# Patient Record
Sex: Female | Born: 1980
Health system: Southern US, Community
[De-identification: ages and names within clinical notes are randomized; demographics above are authoritative.]

## PROBLEM LIST (undated history)

## (undated) ENCOUNTER — Ambulatory Visit (HOSPITAL_COMMUNITY): Payer: 59

## (undated) DIAGNOSIS — I10 Essential (primary) hypertension: Secondary | ICD-10-CM

## (undated) DIAGNOSIS — K219 Gastro-esophageal reflux disease without esophagitis: Secondary | ICD-10-CM

## (undated) DIAGNOSIS — O24919 Unspecified diabetes mellitus in pregnancy, unspecified trimester: Secondary | ICD-10-CM

## (undated) DIAGNOSIS — J302 Other seasonal allergic rhinitis: Secondary | ICD-10-CM

## (undated) HISTORY — PX: TUBAL LIGATION: SHX77

---

## 2007-05-01 ENCOUNTER — Emergency Department (HOSPITAL_COMMUNITY): Admission: EM | Admit: 2007-05-01 | Discharge: 2007-05-01 | Payer: Self-pay | Admitting: Family Medicine

## 2010-01-02 ENCOUNTER — Emergency Department (HOSPITAL_COMMUNITY): Admission: EM | Admit: 2010-01-02 | Discharge: 2010-01-02 | Payer: Self-pay | Admitting: Emergency Medicine

## 2010-01-03 ENCOUNTER — Inpatient Hospital Stay (HOSPITAL_COMMUNITY): Admission: AD | Admit: 2010-01-03 | Discharge: 2010-01-03 | Payer: Self-pay | Admitting: Obstetrics and Gynecology

## 2010-01-03 ENCOUNTER — Ambulatory Visit: Payer: Self-pay | Admitting: Obstetrics and Gynecology

## 2010-06-28 ENCOUNTER — Emergency Department (HOSPITAL_COMMUNITY): Admission: EM | Admit: 2010-06-28 | Discharge: 2010-06-29 | Payer: Self-pay | Admitting: Emergency Medicine

## 2010-11-27 ENCOUNTER — Encounter: Payer: Self-pay | Admitting: Obstetrics & Gynecology

## 2010-11-27 ENCOUNTER — Ambulatory Visit
Admission: RE | Admit: 2010-11-27 | Discharge: 2010-11-27 | Payer: Self-pay | Source: Home / Self Care | Attending: Obstetrics & Gynecology | Admitting: Obstetrics & Gynecology

## 2010-11-27 LAB — CONVERTED CEMR LAB
HCT: 36.3 % (ref 36.0–46.0)
Hemoglobin: 11.9 g/dL — ABNORMAL LOW (ref 12.0–15.0)
MCHC: 32.8 g/dL (ref 30.0–36.0)
MCV: 85 fL (ref 78.0–100.0)
Platelets: 280 10*3/uL (ref 150–400)
RBC: 4.27 M/uL (ref 3.87–5.11)
RDW: 14.5 % (ref 11.5–15.5)
TSH: 0.617 microintl units/mL (ref 0.350–4.500)
WBC: 6.7 10*3/uL (ref 4.0–10.5)

## 2010-11-29 ENCOUNTER — Ambulatory Visit (HOSPITAL_COMMUNITY)
Admission: RE | Admit: 2010-11-29 | Discharge: 2010-11-29 | Payer: Self-pay | Source: Home / Self Care | Attending: Obstetrics & Gynecology | Admitting: Obstetrics & Gynecology

## 2010-12-02 LAB — POCT PREGNANCY, URINE: Preg Test, Ur: NEGATIVE

## 2010-12-04 ENCOUNTER — Ambulatory Visit
Admission: RE | Admit: 2010-12-04 | Discharge: 2010-12-04 | Payer: Self-pay | Source: Home / Self Care | Attending: Obstetrics & Gynecology | Admitting: Obstetrics & Gynecology

## 2010-12-04 LAB — POCT URINALYSIS DIPSTICK
Bilirubin Urine: NEGATIVE
Ketones, ur: NEGATIVE mg/dL
Nitrite: NEGATIVE
Protein, ur: NEGATIVE mg/dL
Specific Gravity, Urine: 1.025 (ref 1.005–1.030)
Urine Glucose, Fasting: NEGATIVE mg/dL
Urobilinogen, UA: 0.2 mg/dL (ref 0.0–1.0)
pH: 6 (ref 5.0–8.0)

## 2010-12-13 ENCOUNTER — Ambulatory Visit: Payer: Self-pay | Admitting: Obstetrics & Gynecology

## 2010-12-13 ENCOUNTER — Ambulatory Visit: Admit: 2010-12-13 | Payer: Self-pay | Admitting: Obstetrics and Gynecology

## 2010-12-13 DIAGNOSIS — N926 Irregular menstruation, unspecified: Secondary | ICD-10-CM

## 2010-12-13 DIAGNOSIS — R32 Unspecified urinary incontinence: Secondary | ICD-10-CM

## 2010-12-14 ENCOUNTER — Encounter (INDEPENDENT_AMBULATORY_CARE_PROVIDER_SITE_OTHER): Payer: Self-pay | Admitting: *Deleted

## 2011-01-24 LAB — COMPREHENSIVE METABOLIC PANEL
ALT: 23 U/L (ref 0–35)
AST: 18 U/L (ref 0–37)
Albumin: 4 g/dL (ref 3.5–5.2)
Alkaline Phosphatase: 97 U/L (ref 39–117)
BUN: 10 mg/dL (ref 6–23)
CO2: 27 mEq/L (ref 19–32)
Calcium: 8.9 mg/dL (ref 8.4–10.5)
Chloride: 106 mEq/L (ref 96–112)
Creatinine, Ser: 0.63 mg/dL (ref 0.4–1.2)
GFR calc Af Amer: >60 mL/min (ref >60)
GFR calc non Af Amer: >60 mL/min (ref >60)
Glucose, Bld: 116 mg/dL — ABNORMAL HIGH (ref 70–99)
Potassium: 3.1 mEq/L — ABNORMAL LOW (ref 3.5–5.1)
Sodium: 138 mEq/L (ref 135–145)
Total Bilirubin: 0.3 mg/dL (ref 0.3–1.2)
Total Protein: 7.5 g/dL (ref 6.0–8.3)

## 2011-01-24 LAB — CBC
HCT: 34.2 % — ABNORMAL LOW (ref 36.0–46.0)
Hemoglobin: 11.8 g/dL — ABNORMAL LOW (ref 12.0–15.0)
MCH: 29.1 pg (ref 26.0–34.0)
MCHC: 34.6 g/dL (ref 30.0–36.0)
MCV: 84.2 fL (ref 78.0–100.0)
Platelets: 260 10*3/uL (ref 150–400)
RBC: 4.06 MIL/uL (ref 3.87–5.11)
RDW: 13.4 % (ref 11.5–15.5)
WBC: 6.7 10*3/uL (ref 4.0–10.5)

## 2011-01-24 LAB — URINALYSIS, ROUTINE W REFLEX MICROSCOPIC
Bilirubin Urine: NEGATIVE
Glucose, UA: NEGATIVE mg/dL
Ketones, ur: NEGATIVE mg/dL
Leukocytes, UA: NEGATIVE
Nitrite: NEGATIVE
Protein, ur: NEGATIVE mg/dL
Specific Gravity, Urine: 1.033 — ABNORMAL HIGH (ref 1.005–1.030)
Urobilinogen, UA: 0.2 mg/dL (ref 0.0–1.0)
pH: 5.5 (ref 5.0–8.0)

## 2011-01-24 LAB — LIPASE, BLOOD: Lipase: 23 U/L (ref 11–59)

## 2011-01-24 LAB — URINE MICROSCOPIC-ADD ON

## 2011-01-24 LAB — DIFFERENTIAL
Basophils Absolute: 0.2 10*3/uL — ABNORMAL HIGH (ref 0.0–0.1)
Basophils Relative: 3 % — ABNORMAL HIGH (ref 0–1)
Eosinophils Absolute: 0.2 10*3/uL (ref 0.0–0.7)
Eosinophils Relative: 3 % (ref 0–5)
Lymphocytes Relative: 43 % (ref 12–46)
Lymphs Abs: 2.9 10*3/uL (ref 0.7–4.0)
Monocytes Absolute: 0.4 10*3/uL (ref 0.1–1.0)
Monocytes Relative: 6 % (ref 3–12)
Neutro Abs: 3 10*3/uL (ref 1.7–7.7)
Neutrophils Relative %: 46 % (ref 43–77)

## 2011-01-24 LAB — POCT PREGNANCY, URINE: Preg Test, Ur: NEGATIVE

## 2011-01-29 LAB — POCT URINALYSIS DIP (DEVICE)
Bilirubin Urine: NEGATIVE
Glucose, UA: NEGATIVE mg/dL
Ketones, ur: NEGATIVE mg/dL
Nitrite: NEGATIVE
Protein, ur: NEGATIVE mg/dL
Specific Gravity, Urine: 1.02 (ref 1.005–1.030)
Urobilinogen, UA: 0.2 mg/dL (ref 0.0–1.0)
pH: 6.5 (ref 5.0–8.0)

## 2011-01-29 LAB — WET PREP, GENITAL
Trich, Wet Prep: NONE SEEN
WBC, Wet Prep HPF POC: NONE SEEN
Yeast Wet Prep HPF POC: NONE SEEN

## 2011-01-29 LAB — POCT PREGNANCY, URINE: Preg Test, Ur: NEGATIVE

## 2011-01-29 LAB — GC/CHLAMYDIA PROBE AMP, GENITAL
Chlamydia, DNA Probe: NEGATIVE
GC Probe Amp, Genital: NEGATIVE

## 2011-01-30 NOTE — Progress Notes (Signed)
Deborah Jacobson, BRADNER NO.:  [id number]  MEDICAL RECORD NO.:  [id number]           PATIENT TYPE:  LOCATION:  [facility]                     FACILITY:  PHYSICIAN:  Allie Bossier, MD        DATE OF BIRTH:  [birth year]  DATE OF SERVICE:  11/27/2010                                 CLINIC NOTE  [name] is a 30 year old married black G6, P4, A2 who comes in here today as a self-referral.  She comes in with a complaint of skipping periods.  In fact from August until December 2011, she had no period. When she does have her periods, it lasts about 4 days and it is somewhat heavy.  She had a tubal in the past.  PAST MEDICAL HISTORY:  Reflux and obesity.  MEDICATIONS:  Multivitamins.  PAST SURGICAL HISTORY:  Tubal.  PHYSICAL EXAMINATION:  GENERAL:  Morbidly obese black female in no apparent distress.  There is no particular hirsutism noted. VITAL SIGNS:  Please note, she is 4 feet 9 inches, weighs 198 pounds. PELVIC:  Her cervix appears normal.  Her uterus is about 12-week size, consistent with fibroids.  Her adnexa are nonenlarged.  ASSESSMENT AND PLAN:  Oligomenorrhea.  I have ordered a TSH and CBC.  I am ordering a gynecologic ultrasound.  Please note, she also does complain of some pelvic pain for the last year.  She does not take any medicines for this said pelvic pain, and I was not able to elicit any specific pain on exam.  She will follow up after these results are available.     Allie Bossier, MD    MCD/MEDQ  D:  11/27/2010  T:  11/28/2010  Job:  [id number]

## 2011-01-31 LAB — WET PREP, GENITAL
Clue Cells Wet Prep HPF POC: NONE SEEN
Trich, Wet Prep: NONE SEEN
Yeast Wet Prep HPF POC: NONE SEEN

## 2011-01-31 LAB — CBC
HCT: 33.8 % — ABNORMAL LOW (ref 36.0–46.0)
Hemoglobin: 11.2 g/dL — ABNORMAL LOW (ref 12.0–15.0)
MCHC: 33.2 g/dL (ref 30.0–36.0)
MCV: 89 fL (ref 78.0–100.0)
Platelets: 250 10*3/uL (ref 150–400)
RBC: 3.8 MIL/uL — ABNORMAL LOW (ref 3.87–5.11)
RDW: 13.2 % (ref 11.5–15.5)
WBC: 7.2 10*3/uL (ref 4.0–10.5)

## 2011-01-31 LAB — TSH: TSH: 0.874 u[IU]/mL (ref 0.350–4.500)

## 2011-01-31 NOTE — Progress Notes (Signed)
[name], [name] NO.:  [id number]  MEDICAL RECORD NO.:  [id number]           PATIENT TYPE:  A  LOCATION:  [facility]                   FACILITY:  [facility]  PHYSICIAN:  Scheryl Darter, MD       DATE OF BIRTH:  1981-04-29  DATE OF SERVICE:  12/13/2010                                 CLINIC NOTE  The patient returns today for results of her lab work and ultrasound. The patient was seen by Dr. Marice Potter on November 27, 2010.  She is 30 year old black female, gravida 6, para 4, abortus 2.  Last menstrual period in December who has had irregular periods.  Her menses are somewhat heavy and may last 4-7 days.  Status post tubal ligation.  On physical exam on November 27, 2010, Dr. Marice Potter identified uterine enlargement consistent with fibroids.  The patient has some occasional sharp lower abdominal pain.  She says when ultrasound was being performed, there was some pain in her right lower quadrant near her C- section scar.  She says that she has noticed last few months that she will leak urine with coughing or straining or with laughing.  No particular symptoms of urge.  She occasionally has nocturia.  On physical exam, her C-section scar is well-healed and there is no bulge or hernia identified.  Pelvic pain was deferred today.  Ultrasound on November 29, 2010, states that this is a normal exam, although suboptimal visualization of the uterine fundus due to the position of the patient's pain, endometrium was not well visualized.  Portions of lower uterine segment were unremarkable.  The ovaries were normal.  No free fluid.  IMPRESSION:  Pelvic pain and signs of urinary incontinence.  Pain maybe of the bladder origin.  There was some blood in her urine when she was seen 2 weeks ago.  I will request a urine culture.  We will make a urology referral.  We discussed her regular menses and she would like to have regular cycles and would be interested in being on oral contraceptives  for cycle control.  Gave her prescription for Alesse. She will return in 3 months to review her progress here.     Scheryl Darter, MD    JA/MEDQ  D:  12/13/2010  T:  12/14/2010  Job:  [id number]

## 2011-08-22 ENCOUNTER — Inpatient Hospital Stay (INDEPENDENT_AMBULATORY_CARE_PROVIDER_SITE_OTHER)
Admission: RE | Admit: 2011-08-22 | Discharge: 2011-08-22 | Disposition: A | Discharge status: Home / Self Care | Payer: BC Managed Care – PPO | Source: Ambulatory Visit | Attending: Family Medicine | Admitting: Family Medicine

## 2011-08-22 DIAGNOSIS — N760 Acute vaginitis: Secondary | ICD-10-CM

## 2011-08-22 DIAGNOSIS — A499 Bacterial infection, unspecified: Secondary | ICD-10-CM

## 2011-08-22 LAB — WET PREP, GENITAL
Trich, Wet Prep: NONE SEEN
Yeast Wet Prep HPF POC: NONE SEEN

## 2011-08-23 LAB — GC/CHLAMYDIA PROBE AMP, GENITAL
Chlamydia, DNA Probe: NEGATIVE
GC Probe Amp, Genital: NEGATIVE

## 2011-08-27 LAB — WET PREP, GENITAL
Trich, Wet Prep: NONE SEEN
Yeast Wet Prep HPF POC: NONE SEEN

## 2011-08-27 LAB — POCT URINALYSIS DIP (DEVICE)
Glucose, UA: NEGATIVE
Hgb urine dipstick: NEGATIVE
Ketones, ur: 40 — AB
Nitrite: NEGATIVE
Operator id: 239701
Protein, ur: 30 — AB
Specific Gravity, Urine: 1.025
Urobilinogen, UA: 1
pH: 5.5

## 2011-08-27 LAB — POCT PREGNANCY, URINE
Operator id: 239701
Preg Test, Ur: NEGATIVE

## 2011-08-27 LAB — GC/CHLAMYDIA PROBE AMP, GENITAL
Chlamydia, DNA Probe: NEGATIVE
GC Probe Amp, Genital: NEGATIVE

## 2012-12-27 ENCOUNTER — Emergency Department (HOSPITAL_COMMUNITY)
Admission: EM | Admit: 2012-12-27 | Discharge: 2012-12-27 | Disposition: A | Discharge status: Home / Self Care | Payer: BC Managed Care – PPO | Attending: Emergency Medicine | Admitting: Emergency Medicine

## 2012-12-27 ENCOUNTER — Encounter (HOSPITAL_COMMUNITY): Payer: Self-pay | Admitting: *Deleted

## 2012-12-27 DIAGNOSIS — J3489 Other specified disorders of nose and nasal sinuses: Secondary | ICD-10-CM | POA: Insufficient documentation

## 2012-12-27 DIAGNOSIS — J00 Acute nasopharyngitis [common cold]: Secondary | ICD-10-CM | POA: Insufficient documentation

## 2012-12-27 DIAGNOSIS — J029 Acute pharyngitis, unspecified: Secondary | ICD-10-CM | POA: Insufficient documentation

## 2012-12-27 DIAGNOSIS — H109 Unspecified conjunctivitis: Secondary | ICD-10-CM | POA: Insufficient documentation

## 2012-12-27 DIAGNOSIS — J069 Acute upper respiratory infection, unspecified: Secondary | ICD-10-CM | POA: Insufficient documentation

## 2012-12-27 DIAGNOSIS — H938X9 Other specified disorders of ear, unspecified ear: Secondary | ICD-10-CM | POA: Insufficient documentation

## 2012-12-27 MED ORDER — KETOROLAC TROMETHAMINE 0.5 % OP SOLN: 1.0000 [drp] | Freq: Four times a day (QID) | OPHTHALMIC | Status: DC

## 2012-12-27 MED ORDER — ERYTHROMYCIN 5 MG/GM OP OINT: TOPICAL_OINTMENT | Freq: Every day | OPHTHALMIC | Status: DC

## 2012-12-27 MED ORDER — FLUORESCEIN SODIUM 1 MG OP STRP
1.0000 | ORAL_STRIP | Freq: Once | OPHTHALMIC | Status: AC
  Administered 2012-12-27: 1 via OPHTHALMIC
  Filled 2012-12-27: qty 1

## 2012-12-27 MED ORDER — TETRACAINE HCL 0.5 % OP SOLN
2.0000 [drp] | Freq: Once | OPHTHALMIC | Status: AC
  Administered 2012-12-27: 2 [drp] via OPHTHALMIC
  Filled 2012-12-27: qty 2

## 2012-12-27 NOTE — ED Provider Notes (Signed)
History     CSN: [id number]  Arrival date & time 12/27/12  0509   First MD Initiated Contact with Patient 12/27/12 [phone]      Chief Complaint  Patient presents with  . URI  . Eye Pain    (Consider location/radiation/quality/duration/timing/severity/associated sxs/prior treatment) HPI 32 year old female presents emergency department with chief complaint of left eye your dictation and drainage.  Patient has had symptoms of upper respiratory infection for the past 3 days including cough, coryza, congestion, sore and itchy throat and ears.  Patient states that last night she began having your dictation and watering of her left eye.  This morning she woke with crusted on her lashes and purulent discharge from the eye.  She complains of some feeling of irritation "like sand is in my eye."  The patient does not wear her contact lenses. She has no history of eye problems previously.  Denies fevers, chills, myalgias, arthralgias. Denies DOE, SOB, chest tightness or pressure, radiation to left arm, jaw or back, or diaphoresis. Denies dysuria, flank pain, suprapubic pain, frequency, urgency, or hematuria. Denies headaches, light headedness, weakness, visual disturbances. Denies abdominal pain, nausea, vomiting, diarrhea or constipation.   History reviewed. No pertinent past medical history.  History reviewed. No pertinent past surgical history.  No family history on file.  History  Substance Use Topics  . Smoking status: Never Smoker   . Smokeless tobacco: Not on file  . Alcohol Use: No    OB History   Grav Para Term Preterm Abortions TAB SAB Ect Mult Living                  Review of Systems Ten systems reviewed and are negative for acute change, except as noted in the HPI.   Allergies  Review of patient's allergies indicates no known allergies.  Home Medications   Current Outpatient Rx  Name  Route  Sig  Dispense  Refill  . acetaminophen (TYLENOL) 500 MG tablet   Oral  Take 500 mg by mouth every 6 (six) hours as needed for pain.         Marland Kitchen Phenyleph-Doxylamine-DM-APAP (ALKA-SELTZER PLS ALLERGY & [facility] PO)   Oral   Take 1 tablet by mouth every 4 (four) hours as needed (congestion).           BP 130/81  Pulse 91  Temp(Src) 98.1 F (36.7 C) (Oral)  Resp 18  SpO2 98%  LMP 04/26/2012  Physical Exam  Constitutional: She is oriented to person, place, and time. She appears well-developed and well-nourished. No distress.  HENT:  Head: Normocephalic and atraumatic.  Eyes: EOM are normal. Pupils are equal, round, and reactive to light. No foreign bodies found. Right eye exhibits no chemosis, no discharge, no exudate and no hordeolum. No foreign body present in the right eye. Left eye exhibits discharge. Left eye exhibits no chemosis, no exudate and no hordeolum. No foreign body present in the left eye. Right conjunctiva is not injected. Right conjunctiva has no hemorrhage. Left conjunctiva is injected. Left conjunctiva has no hemorrhage. No scleral icterus.  Neck: Normal range of motion.  Cardiovascular: Normal rate, regular rhythm and normal heart sounds.  Exam reveals no gallop and no friction rub.   No murmur heard. Pulmonary/Chest: Effort normal and breath sounds normal. No respiratory distress.  Abdominal: Soft. Bowel sounds are normal. She exhibits no distension and no mass. There is no tenderness. There is no guarding.  Neurological: She is alert and oriented to person, place, and  time.  Skin: Skin is warm and dry. She is not diaphoretic.   fluorescein exam reveals no abrasions or ulcerations. ED Course  Procedures (including critical care time)   Labs Reviewed - No data to display No results found.   1. Conjunctivitis       MDM  7:06 AM  Pt dx likely viral conjunctivitis based on presentation & eye exam.will d/c with abx. No evidence of HSV or VSV infection. Pt is not a contact lens wearer.  Exam non-concerning for orbital cellulitis,  hyphema, corneal ulcers, corneal abrasions or trauma.  Patient will be discharged home with ketorolac drops and emycin salve..  Patient has been instructed to use cool compresses and practice personal hygiene with frequent hand washing.  Patient understands to follow up with ophthalmology, especially if new symptoms including change in vision, purulent drainage, or entrapment occur.          Arthor Captain, PA-C 12/27/12 [phone]

## 2012-12-27 NOTE — ED Notes (Signed)
Patient states cold symptoms x few days and now left eye with pain and drainage

## 2012-12-28 NOTE — ED Provider Notes (Signed)
Medical screening examination/treatment/procedure(s) were performed by non-physician practitioner and as supervising physician I was immediately available for consultation/collaboration.  Fujiko Picazo, MD 12/28/12 0141 

## 2013-03-07 ENCOUNTER — Encounter (HOSPITAL_COMMUNITY): Payer: Self-pay | Admitting: *Deleted

## 2013-03-07 ENCOUNTER — Emergency Department (HOSPITAL_COMMUNITY)
Admission: EM | Admit: 2013-03-07 | Discharge: 2013-03-07 | Disposition: A | Discharge status: Home / Self Care | Payer: BC Managed Care – PPO | Attending: Emergency Medicine | Admitting: Emergency Medicine

## 2013-03-07 DIAGNOSIS — Z91040 Latex allergy status: Secondary | ICD-10-CM | POA: Insufficient documentation

## 2013-03-07 DIAGNOSIS — R631 Polydipsia: Secondary | ICD-10-CM | POA: Insufficient documentation

## 2013-03-07 DIAGNOSIS — R42 Dizziness and giddiness: Secondary | ICD-10-CM | POA: Insufficient documentation

## 2013-03-07 DIAGNOSIS — R358 Other polyuria: Secondary | ICD-10-CM | POA: Insufficient documentation

## 2013-03-07 DIAGNOSIS — R079 Chest pain, unspecified: Secondary | ICD-10-CM | POA: Insufficient documentation

## 2013-03-07 DIAGNOSIS — Z3202 Encounter for pregnancy test, result negative: Secondary | ICD-10-CM | POA: Insufficient documentation

## 2013-03-07 DIAGNOSIS — Z8719 Personal history of other diseases of the digestive system: Secondary | ICD-10-CM | POA: Insufficient documentation

## 2013-03-07 DIAGNOSIS — R110 Nausea: Secondary | ICD-10-CM | POA: Insufficient documentation

## 2013-03-07 DIAGNOSIS — R3589 Other polyuria: Secondary | ICD-10-CM | POA: Insufficient documentation

## 2013-03-07 DIAGNOSIS — R350 Frequency of micturition: Secondary | ICD-10-CM | POA: Insufficient documentation

## 2013-03-07 DIAGNOSIS — H538 Other visual disturbances: Secondary | ICD-10-CM | POA: Insufficient documentation

## 2013-03-07 HISTORY — DX: Gastro-esophageal reflux disease without esophagitis: K21.9

## 2013-03-07 HISTORY — DX: Unspecified diabetes mellitus in pregnancy, unspecified trimester: O24.919

## 2013-03-07 LAB — URINALYSIS, ROUTINE W REFLEX MICROSCOPIC
Bilirubin Urine: NEGATIVE
Glucose, UA: NEGATIVE mg/dL
Ketones, ur: NEGATIVE mg/dL
Leukocytes, UA: NEGATIVE
Nitrite: NEGATIVE
Protein, ur: NEGATIVE mg/dL
Specific Gravity, Urine: 1.01 (ref 1.005–1.030)
Urobilinogen, UA: 0.2 mg/dL (ref 0.0–1.0)
pH: 6.5 (ref 5.0–8.0)

## 2013-03-07 LAB — CBC
HCT: 36.6 % (ref 36.0–46.0)
Hemoglobin: 12.4 g/dL (ref 12.0–15.0)
MCH: 27.6 pg (ref 26.0–34.0)
MCHC: 33.9 g/dL (ref 30.0–36.0)
MCV: 81.5 fL (ref 78.0–100.0)
Platelets: 247 10*3/uL (ref 150–400)
RBC: 4.49 MIL/uL (ref 3.87–5.11)
RDW: 14.2 % (ref 11.5–15.5)
WBC: 6.1 10*3/uL (ref 4.0–10.5)

## 2013-03-07 LAB — BASIC METABOLIC PANEL
BUN: 9 mg/dL (ref 6–23)
CO2: 27 mEq/L (ref 19–32)
Calcium: 9.8 mg/dL (ref 8.4–10.5)
Chloride: 103 mEq/L (ref 96–112)
Creatinine, Ser: 0.67 mg/dL (ref 0.50–1.10)
GFR calc Af Amer: >90 mL/min (ref >90)
GFR calc non Af Amer: >90 mL/min (ref >90)
Glucose, Bld: 94 mg/dL (ref 70–99)
Potassium: 3.8 mEq/L (ref 3.5–5.1)
Sodium: 136 mEq/L (ref 135–145)

## 2013-03-07 LAB — URINE MICROSCOPIC-ADD ON

## 2013-03-07 LAB — POCT PREGNANCY, URINE: Preg Test, Ur: NEGATIVE

## 2013-03-07 LAB — GLUCOSE, CAPILLARY: Glucose-Capillary: 101 mg/dL — ABNORMAL HIGH (ref 70–99)

## 2013-03-07 NOTE — ED Notes (Signed)
Pt is concerned that she has symptoms of diabetes:  Lightheadedness, frequent urination, jittery, and nauseated.  Pt reports numbness in fingertips for a while, swelling in ankles/hands.

## 2013-03-07 NOTE — ED Notes (Signed)
Pt states that she had chest pain on 4/24, and is experiencing the pain again today. Pt states that she becomes short of breath with movement. Pt also states that she has a slight headache that started yesterday, and she is having numbness in her right fingers.

## 2013-03-07 NOTE — ED Provider Notes (Signed)
History     CSN: [id number]  Arrival date & time 03/07/13  7829   First MD Initiated Contact with Patient 03/07/13 1140      Chief Complaint  Patient presents with  . Dizziness  . Urinary Frequency    (Consider location/radiation/quality/duration/timing/severity/associated sxs/prior treatment) HPI  Deborah Jacobson is an obese  32 y.o. female female with no significant medical history presents with feeling lightheaded, nauseous, tingling in her fingers, polydipsia, polyuria and blurred vision  x 2 days. Pt states that last Thursday she experienced sharp right sided chest pain that lasted less than a minute before it would resolve completely. She denies any associated symptoms such as SOB, nausea, or diaphoresis. She experienced several cycles of this pain during the day. Two days ago she began feeling dizzy when walking, nauseous, and "jittery". She also describes tingling in her finger tips bilaterally. She also describes one episode of similar chest pain to Thursday this morning. She also describes increased thirst and polyuria. She denies dysuria or urgency. This morning she also noticed her vision becoming slightly blurry. She also describes some increased swelling in her feet which is a chronic problem for this patient. She denies vision loss, fever, cold symptoms, SOB, abdominal pain, vomiting, diarrhea, hematocheiza, or weakness.   Past Medical History  Diagnosis Date  . Diabetes in pregnancy   . Acid reflux     Past Surgical History  Procedure Laterality Date  . Cesarean section      No family history on file.  History  Substance Use Topics  . Smoking status: Never Smoker   . Smokeless tobacco: Not on file  . Alcohol Use: No    OB History   Grav Para Term Preterm Abortions TAB SAB Ect Mult Living                  Review of Systems  Constitutional: Negative for fever.  Respiratory: Negative for shortness of breath.   Cardiovascular: Positive for chest pain.   Gastrointestinal: Positive for nausea. Negative for vomiting, abdominal pain and diarrhea.  Endocrine: Positive for polydipsia and polyuria. Negative for polyphagia.  Neurological: Positive for light-headedness.  All other systems reviewed and are negative.    Allergies  Latex  Home Medications   Current Outpatient Rx  Name  Route  Sig  Dispense  Refill  . Melatonin 3 MG TABS   Oral   Take 3 mg by mouth at bedtime.           BP 124/90  Pulse 70  Temp(Src) 98.5 F (36.9 C) (Oral)  Resp 18  SpO2 100%  LMP 02/04/2013  Physical Exam  Nursing note and vitals reviewed. Constitutional: She is oriented to person, place, and time. She appears well-developed and well-nourished. No distress.  Obese  HENT:  Head: Normocephalic.  Mouth/Throat: Oropharynx is clear and moist.  Eyes: Conjunctivae and EOM are normal.  Neck: Normal range of motion.  Acanthosis nigricans  Cardiovascular: Normal rate, regular rhythm, normal heart sounds and intact distal pulses.   No murmur heard. Pulmonary/Chest: Effort normal and breath sounds normal. No stridor. No respiratory distress. She has no wheezes. She has no rales. She exhibits no tenderness.  Abdominal: Soft. Bowel sounds are normal. She exhibits no distension and no mass. There is no tenderness. There is no rebound and no guarding.  Musculoskeletal: Normal range of motion.  Neurological: She is alert and oriented to person, place, and time.  Psychiatric: She has a normal mood and affect.  ED Course  Procedures (including critical care time)  Labs Reviewed  URINALYSIS, ROUTINE W REFLEX MICROSCOPIC - Abnormal; Notable for the following:    Hgb urine dipstick SMALL (*)    All other components within normal limits  GLUCOSE, CAPILLARY - Abnormal; Notable for the following:    Glucose-Capillary 101 (*)    All other components within normal limits  URINE MICROSCOPIC-ADD ON - Abnormal; Notable for the following:    Squamous  Epithelial / LPF FEW (*)    All other components within normal limits  CBC  BASIC METABOLIC PANEL  POCT PREGNANCY, URINE   No results found.   Date: 03/07/2013  Rate: 62  Rhythm: normal sinus rhythm  QRS Axis: normal  Intervals: normal  ST/T Wave abnormalities: nonspecific T wave changes  Conduction Disutrbances:none  Narrative Interpretation:   Old EKG Reviewed: unchanged    03/07/13 1238  Visual Acuity Bilateral Near 20/20 Bilateral Distance R Near R Distance 20/30 L Near L Distance 20/30   1. Urinary frequency       MDM   Deborah Jacobson is a 32 y.o. female with concerns that she is diabetic, patient has family history and has polyuria and polydipsia. CBG today's normal at 101. I've explained to the patient she will need A1c testing with her primary care physician. Patient has verbalized her understanding and said that she will followup. Blood work and urinalysis showed no gross abnormalities. EKG is triage initiated for fleeting right-sided chest pain. Also nonischemic. Patient reports a blurred vision her visual acuity shows 20/30 bilaterally.   Filed Vitals:   03/07/13 1235 03/07/13 1236 03/07/13 1237 03/07/13 1245  BP: 105/66 100/60 112/64 112/73  Pulse: 56 65 59 86  Temp:      TempSrc:      Resp: 18  17 16   SpO2:    100%     VSS and patient is appropriate for and amenable to discharge at this time. Pt verbalized understanding and agrees with care plan. Outpatient follow-up and return precautions given.         Wynetta Emery, PA-C 03/07/13 1311

## 2013-03-09 NOTE — ED Provider Notes (Signed)
Medical screening examination/treatment/procedure(s) were performed by non-physician practitioner and as supervising physician I was immediately available for consultation/collaboration.   Rayelle Armor E Aireanna Luellen, MD 03/09/13 0725 

## 2013-06-26 ENCOUNTER — Encounter (HOSPITAL_COMMUNITY): Payer: Self-pay | Admitting: Emergency Medicine

## 2013-06-26 ENCOUNTER — Emergency Department (HOSPITAL_COMMUNITY)
Admission: EM | Admit: 2013-06-26 | Discharge: 2013-06-27 | Disposition: A | Discharge status: Home / Self Care | Payer: BC Managed Care – PPO | Attending: Emergency Medicine | Admitting: Emergency Medicine

## 2013-06-26 DIAGNOSIS — X58XXXA Exposure to other specified factors, initial encounter: Secondary | ICD-10-CM | POA: Insufficient documentation

## 2013-06-26 DIAGNOSIS — IMO0002 Reserved for concepts with insufficient information to code with codable children: Secondary | ICD-10-CM | POA: Insufficient documentation

## 2013-06-26 DIAGNOSIS — T148XXA Other injury of unspecified body region, initial encounter: Secondary | ICD-10-CM

## 2013-06-26 DIAGNOSIS — Z8632 Personal history of gestational diabetes: Secondary | ICD-10-CM | POA: Insufficient documentation

## 2013-06-26 DIAGNOSIS — Z91040 Latex allergy status: Secondary | ICD-10-CM | POA: Insufficient documentation

## 2013-06-26 DIAGNOSIS — Y9289 Other specified places as the place of occurrence of the external cause: Secondary | ICD-10-CM | POA: Insufficient documentation

## 2013-06-26 DIAGNOSIS — Y939 Activity, unspecified: Secondary | ICD-10-CM | POA: Insufficient documentation

## 2013-06-26 DIAGNOSIS — Z8719 Personal history of other diseases of the digestive system: Secondary | ICD-10-CM | POA: Insufficient documentation

## 2013-06-26 DIAGNOSIS — M79601 Pain in right arm: Secondary | ICD-10-CM
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
## 2013-06-27 MED ORDER — NAPROXEN 375 MG PO TABS: 375.0000 mg | ORAL_TABLET | Freq: Two times a day (BID) | ORAL | Status: DC

## 2013-06-27 MED ORDER — NAPROXEN 250 MG PO TABS
375.0000 mg | ORAL_TABLET | Freq: Once | ORAL | Status: AC
  Administered 2013-06-27: 375 mg via ORAL
  Filled 2013-06-27: qty 2

## 2013-06-27 MED ORDER — CYCLOBENZAPRINE HCL 10 MG PO TABS: 10.0000 mg | ORAL_TABLET | Freq: Two times a day (BID) | ORAL | Status: DC | PRN

## 2013-06-27 MED ORDER — DIAZEPAM 5 MG PO TABS
5.0000 mg | ORAL_TABLET | Freq: Once | ORAL | Status: AC
  Administered 2013-06-27: 5 mg via ORAL
  Filled 2013-06-27: qty 1

## 2013-06-30 NOTE — ED Provider Notes (Signed)
Medical screening examination/treatment/procedure(s) were performed by non-physician practitioner and as supervising physician I was immediately available for consultation/collaboration.   Leonardo Makris E Lylla Eifler, MD 06/30/13 1521 

## 2013-08-17 ENCOUNTER — Ambulatory Visit: Payer: Self-pay | Admitting: Podiatry

## 2013-08-25 ENCOUNTER — Encounter (HOSPITAL_COMMUNITY): Payer: Self-pay | Admitting: Emergency Medicine

## 2013-08-25 ENCOUNTER — Emergency Department (HOSPITAL_COMMUNITY)
Admission: EM | Admit: 2013-08-25 | Discharge: 2013-08-25 | Disposition: A | Discharge status: Home / Self Care | Payer: BC Managed Care – PPO | Attending: Emergency Medicine | Admitting: Emergency Medicine

## 2013-08-25 ENCOUNTER — Emergency Department (HOSPITAL_COMMUNITY): Payer: BC Managed Care – PPO

## 2013-08-25 DIAGNOSIS — R51 Headache: Secondary | ICD-10-CM | POA: Insufficient documentation

## 2013-08-25 DIAGNOSIS — R079 Chest pain, unspecified: Secondary | ICD-10-CM | POA: Insufficient documentation

## 2013-08-25 DIAGNOSIS — Z91040 Latex allergy status: Secondary | ICD-10-CM | POA: Insufficient documentation

## 2013-08-25 DIAGNOSIS — Z8719 Personal history of other diseases of the digestive system: Secondary | ICD-10-CM | POA: Insufficient documentation

## 2013-08-25 DIAGNOSIS — J209 Acute bronchitis, unspecified: Secondary | ICD-10-CM | POA: Insufficient documentation

## 2013-08-25 DIAGNOSIS — Z8632 Personal history of gestational diabetes: Secondary | ICD-10-CM | POA: Insufficient documentation

## 2013-08-25 DIAGNOSIS — J40 Bronchitis, not specified as acute or chronic: Secondary | ICD-10-CM

## 2013-08-25 LAB — RAPID STREP SCREEN (MED CTR MEBANE ONLY): Streptococcus, Group A Screen (Direct): NEGATIVE

## 2013-08-25 MED ORDER — DEXTROMETHORPHAN POLISTIREX 30 MG/5ML PO LQCR: 60.0000 mg | ORAL | Status: DC | PRN

## 2013-08-25 MED ORDER — AZITHROMYCIN 250 MG PO TABS: 250.0000 mg | ORAL_TABLET | Freq: Every day | ORAL | Status: DC
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
## 2013-08-25 NOTE — ED Provider Notes (Signed)
Medical screening examination/treatment/procedure(s) were performed by non-physician practitioner and as supervising physician I was immediately available for consultation/collaboration.    Vida Roller, MD 08/25/13 [phone]
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
## 2013-08-25 NOTE — ED Provider Notes (Signed)
CSN: [id number]     Arrival date & time 08/25/13  1853 History  This chart was scribed for non-physician practitioner Mckinley Jewel, working with Vida Roller, MD by Clydene Laming, ED Scribe. This patient was seen in room TR06C/TR06C and the patient's care was started at 7:59 PM.   Chief Complaint  Patient presents with  . Sore Throat    The history is provided by the patient. No language interpreter was used.   HPI Comments: Deborah Jacobson is a 32 y.o. female who presents to the Emergency Department complaining of a sore throat with an associated cough, chest pain, sinus pressure, and h/a onset two days ago. 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 Pt denies fever. She reports taking benadryl, Nyquil , and drinking an abundant amount of tea with honey.    Past Medical History  Diagnosis Date  . Diabetes in pregnancy   . Acid reflux    Past Surgical History  Procedure Laterality Date  . Cesarean section     History reviewed. No pertinent family history. History  Substance Use Topics  . Smoking status: Never Smoker   . Smokeless tobacco: Not on file  . Alcohol Use: No   OB History   Grav Para Term Preterm Abortions TAB SAB Ect Mult Living                 Review of Systems  HENT: Positive for sinus pressure and sore throat.   Respiratory: Positive for cough.     Allergies  Latex  Home Medications   Current Outpatient Rx  Name  Route  Sig  Dispense  Refill  . Pseudoeph-Doxylamine-DM-APAP (NYQUIL PO)   Oral   Take 1 tablet by mouth every 6 (six) hours as needed.          Triage Vitals:BP 140/85  Pulse 92  Temp(Src) 99.6 F (37.6 C) (Oral)  Resp 16  Wt 208 lb 7 oz (94.547 kg)  SpO2 97% Physical Exam  Nursing note and vitals reviewed. Constitutional: She is oriented to person, place, and time. She appears well-developed and well-nourished. No distress.  HENT:  Head: Normocephalic and atraumatic.  Eyes:  EOM are normal.  Neck: Neck supple. No tracheal deviation present.  Cardiovascular: Normal rate.   Pulmonary/Chest: Effort normal. No respiratory distress.  Musculoskeletal: Normal range of motion.  Neurological: She is alert and oriented to person, place, and time.  Skin: Skin is warm and dry.  Psychiatric: She has a normal mood and affect. Her behavior is normal.    ED Course  Procedures (including critical care time) DIAGNOSTIC STUDIES: Oxygen Saturation is on 97% on RA, normal by my interpretation.    COORDINATION OF CARE: 8:04 PM- Discussed treatment plan with pt at bedside. Pt verbalized understanding and agreement with plan.   Labs Review Labs Reviewed  RAPID STREP SCREEN   Imaging Review Dg Chest 2 View  08/25/2013   CLINICAL DATA:  Cough and vomiting.  EXAM: CHEST  2 VIEW  COMPARISON:  None.  FINDINGS: The cardiomediastinal silhouette is unremarkable.  Mild peribronchial thickening is identified.  There is no evidence of focal airspace disease, pulmonary edema, suspicious pulmonary nodule/mass, pleural effusion, or pneumothorax. No acute bony abnormalities are identified.  IMPRESSION: Mild peribronchial thickening without focal pneumonia.   Electronically Signed   By: Laveda Abbe M.D.   On: 08/25/2013 21:02    EKG Interpretation   None  MDM   1. Bronchitis    9:11 PM Patient will be treated for bronchitis with azithromycin. Vitals stable and patient afebrile. Patient instructed to return with worsening or concerning symptoms.   I personally performed the services described in this documentation, which was scribed in my presence. The recorded information has been reviewed and is accurate.     Emilia Beck, PA-C 08/25/13 2111  Emilia Beck, PA-C 08/25/13 2218

## 2013-08-27 LAB — CULTURE, GROUP A STREP

## 2014-03-01 ENCOUNTER — Encounter: Payer: Self-pay | Admitting: Podiatry

## 2014-03-01 ENCOUNTER — Ambulatory Visit (INDEPENDENT_AMBULATORY_CARE_PROVIDER_SITE_OTHER): Payer: BC Managed Care – PPO | Admitting: Podiatry

## 2014-03-01 VITALS — BP 131/80 | HR 78 | Ht <= 58 in | Wt 212.0 lb

## 2014-03-01 DIAGNOSIS — Q828 Other specified congenital malformations of skin: Secondary | ICD-10-CM

## 2014-03-01 DIAGNOSIS — M79606 Pain in leg, unspecified: Secondary | ICD-10-CM | POA: Insufficient documentation

## 2014-03-01 DIAGNOSIS — M79609 Pain in unspecified limb: Secondary | ICD-10-CM

## 2014-03-01 DIAGNOSIS — M722 Plantar fascial fibromatosis: Secondary | ICD-10-CM | POA: Insufficient documentation

## 2014-03-01 NOTE — Progress Notes (Signed)
Subjective: 33 year old female presents complaining of pain on left heel and ankle.  She has a history of fractured ankle in 2009 on left. Patient points posterior ankle joint near the borders ofnachilles tendon area being affected area.  Patient on feet all day at work.   Objective:  Neurovascular status are within normal. No edema or erythema noted on affected limb. Normal range of ankle joint motion. Positive of pain upon pressure to left heel plantar to dorsal. Positive of excess first ray sagittal plane motion left. Positive of porokeratotic lesion under 5th MPJ right foot.   Assessment: Plantar fasciitis left heel. Porokeratotic lesion right plantar. Pain in lower limb bilateral.  Plan: Clinical findings and available treatment options reviewed. Left heel injected as per request. Left heel jected with mixture of 4 mg Dexamethasone, 5 mg Triamcinolone, and 1 cc of 0.5% Marcaine plain. Patient tolerated well without difficulty.  Will prepare orthotics on next visit.

## 2014-03-01 NOTE — Patient Instructions (Signed)
Seen for left heel pain and right foot callus. Injection to left heel given. Right foot callus debrided. Need Custom orthotics. Will prepare on next visit.

## 2014-10-14 ENCOUNTER — Emergency Department (HOSPITAL_COMMUNITY)
Admission: EM | Admit: 2014-10-14 | Discharge: 2014-10-14 | Disposition: A | Discharge status: Home / Self Care | Payer: BC Managed Care – PPO | Attending: Emergency Medicine | Admitting: Emergency Medicine

## 2014-10-14 ENCOUNTER — Encounter (HOSPITAL_COMMUNITY): Payer: Self-pay | Admitting: *Deleted

## 2014-10-14 DIAGNOSIS — Z91040 Latex allergy status: Secondary | ICD-10-CM | POA: Insufficient documentation

## 2014-10-14 DIAGNOSIS — Z8719 Personal history of other diseases of the digestive system: Secondary | ICD-10-CM | POA: Insufficient documentation

## 2014-10-14 DIAGNOSIS — M545 Low back pain, unspecified: Secondary | ICD-10-CM

## 2014-10-14 DIAGNOSIS — Z8632 Personal history of gestational diabetes: Secondary | ICD-10-CM | POA: Diagnosis not present

## 2014-10-14 DIAGNOSIS — Z87891 Personal history of nicotine dependence: Secondary | ICD-10-CM | POA: Diagnosis not present

## 2014-10-14 DIAGNOSIS — M549 Dorsalgia, unspecified: Secondary | ICD-10-CM | POA: Diagnosis present

## 2014-10-14 MED ORDER — IBUPROFEN 400 MG PO TABS
800.0000 mg | ORAL_TABLET | Freq: Once | ORAL | Status: AC
  Administered 2014-10-14: 800 mg via ORAL

## 2014-10-14 MED ORDER — IBUPROFEN 800 MG PO TABS: 800.0000 mg | ORAL_TABLET | Freq: Three times a day (TID) | ORAL | Status: DC

## 2014-10-14 MED ORDER — CYCLOBENZAPRINE HCL 10 MG PO TABS
10.0000 mg | ORAL_TABLET | Freq: Once | ORAL | Status: AC
  Administered 2014-10-14: 10 mg via ORAL

## 2014-10-14 NOTE — ED Notes (Signed)
Declined W/C at D/C and was escorted to lobby by RN. 

## 2014-10-14 NOTE — Discharge Instructions (Signed)
Back Pain, Adult Low back pain is very common. About 1 in 5 people have back pain.The cause of low back pain is rarely dangerous. The pain often gets better over time.About half of people with a sudden onset of back pain feel better in just 2 weeks. About 8 in 10 people feel better by 6 weeks.  CAUSES Some common causes of back pain include:  Strain of the muscles or ligaments supporting the spine.  Wear and tear (degeneration) of the spinal discs.  Arthritis.  Direct injury to the back. DIAGNOSIS Most of the time, the direct cause of low back pain is not known.However, back pain can be treated effectively even when the exact cause of the pain is unknown.Answering your caregiver's questions about your overall health and symptoms is one of the most accurate ways to make sure the cause of your pain is not dangerous. If your caregiver needs more information, he or she may order lab work or imaging tests (X-rays or MRIs).However, even if imaging tests show changes in your back, this usually does not require surgery. HOME CARE INSTRUCTIONS For many people, back pain returns.Since low back pain is rarely dangerous, it is often a condition that people can learn to manageon their own.   Remain active. It is stressful on the back to sit or stand in one place. Do not sit, drive, or stand in one place for more than 30 minutes at a time. Take short walks on level surfaces as soon as pain allows.Try to increase the length of time you walk each day.  Do not stay in bed.Resting more than 1 or 2 days can delay your recovery.  Do not avoid exercise or work.Your body is made to move.It is not dangerous to be active, even though your back may hurt.Your back will likely heal faster if you return to being active before your pain is gone.  Pay attention to your body when you bend and lift. Many people have less discomfortwhen lifting if they bend their knees, keep the load close to their bodies,and  avoid twisting. Often, the most comfortable positions are those that put less stress on your recovering back.  Find a comfortable position to sleep. Use a firm mattress and lie on your side with your knees slightly bent. If you lie on your back, put a pillow under your knees.  Only take over-the-counter or prescription medicines as directed by your caregiver. Over-the-counter medicines to reduce pain and inflammation are often the most helpful.Your caregiver may prescribe muscle relaxant drugs.These medicines help dull your pain so you can more quickly return to your normal activities and healthy exercise.  Put ice on the injured area.  Put ice in a plastic bag.  Place a towel between your skin and the bag.  Leave the ice on for 15-20 minutes, 03-04 times a day for the first 2 to 3 days. After that, ice and heat may be alternated to reduce pain and spasms.  Ask your caregiver about trying back exercises and gentle massage. This may be of some benefit.  Avoid feeling anxious or stressed.Stress increases muscle tension and can worsen back pain.It is important to recognize when you are anxious or stressed and learn ways to manage it.Exercise is a great option. SEEK MEDICAL CARE IF:  You have pain that is not relieved with rest or medicine.  You have pain that does not improve in 1 week.  You have new symptoms.  You are generally not feeling well. SEEK   IMMEDIATE MEDICAL CARE IF:   You have pain that radiates from your back into your legs.  You develop new bowel or bladder control problems.  You have unusual weakness or numbness in your arms or legs.  You develop nausea or vomiting.  You develop abdominal pain.  You feel faint. Document Released: 10/27/2005 Document Revised: 04/27/2012 Document Reviewed: 02/28/2014 Exit[employer] Patient Information 2015 [employer]. This information is not intended to replace advice given to you by your health care provider. Make sure you  discuss any questions you have with your health care provider.  

## 2014-10-14 NOTE — ED Provider Notes (Signed)
CSN: [id number]     Arrival date & time 10/14/14  0740 History   First MD Initiated Contact with Patient 10/14/14 0802     Chief Complaint  Patient presents with  . Back Pain     (Consider location/radiation/quality/duration/timing/severity/associated sxs/prior Treatment) Patient is a 33 y.o. female presenting with back pain. The history is provided by the patient. No language interpreter was used.  Back Pain Location:  Lumbar spine Quality:  Aching Associated symptoms: no fever   Associated symptoms comment:  Bilateral lower back and flank pain for the past 2 days without known injury, similar to back pain she has had previously and prescribed muscle relaxers. She did not take any medications for this pain prior to arrival. No urinary symptoms. No abdominal pain, fever, N, V.   Past Medical History  Diagnosis Date  . Diabetes in pregnancy   . Acid reflux    Past Surgical History  Procedure Laterality Date  . Cesarean section     No family history on file. History  Substance Use Topics  . Smoking status: Former Games developermoker  . Smokeless tobacco: Never Used  . Alcohol Use: No   OB History    No data available     Review of Systems  Constitutional: Negative for fever and chills.  HENT: Negative.   Respiratory: Negative.   Cardiovascular: Negative.   Gastrointestinal: Negative.   Musculoskeletal: Positive for back pain.       See HPI.  Skin: Negative.   Neurological: Negative.       Allergies  Latex  Home Medications   Prior to Admission medications   Not on File   BP 133/82 mmHg  Pulse 83  Temp(Src) 98.1 F (36.7 C) (Oral)  Resp 16  SpO2 100%  LMP 09/19/2014 Physical Exam  Constitutional: She is oriented to person, place, and time. She appears well-developed and well-nourished.  HENT:  Head: Normocephalic.  Neck: Normal range of motion. Neck supple.  Cardiovascular: Normal rate and regular rhythm.   Pulmonary/Chest: Effort normal and breath sounds  normal.  Abdominal: Soft. Bowel sounds are normal. There is no tenderness. There is no rebound and no guarding.  Musculoskeletal: Normal range of motion.  Mild bilateral lumbar and paralumbar tenderness without swelling or discoloration.  Neurological: She is alert and oriented to person, place, and time. Coordination normal.  Skin: Skin is warm and dry. No rash noted.  Psychiatric: She has a normal mood and affect.    ED Course  Procedures (including critical care time) Labs Review Labs Reviewed - No data to display  Imaging Review No results found.   EKG Interpretation None      MDM   Final diagnoses:  None    1. Low back pain  Reproducible back pain, worse with movement or position, w/o dysuria or fever. Suspect muscular pain. Will add ibuprofen to her Rx of muscle relaxer.    Arnoldo HookerShari A Aurther Harlin, PA-C 10/14/14 0818  Flint MelterElliott L Wentz, MD 10/14/14 [phone]

## 2014-10-14 NOTE — ED Notes (Signed)
Pt reports back pain started on thanksgiving day. Pt is now worse.

## 2015-02-28 ENCOUNTER — Encounter (HOSPITAL_COMMUNITY): Payer: Self-pay | Admitting: *Deleted

## 2015-02-28 ENCOUNTER — Emergency Department (INDEPENDENT_AMBULATORY_CARE_PROVIDER_SITE_OTHER)
Admission: EM | Admit: 2015-02-28 | Discharge: 2015-02-28 | Disposition: A | Discharge status: Home / Self Care | Payer: BLUE CROSS/BLUE SHIELD | Source: Home / Self Care | Attending: Family Medicine | Admitting: Family Medicine

## 2015-02-28 DIAGNOSIS — B349 Viral infection, unspecified: Secondary | ICD-10-CM | POA: Diagnosis not present

## 2015-02-28 MED ORDER — IBUPROFEN 800 MG PO TABS: 800.0000 mg | ORAL_TABLET | Freq: Three times a day (TID) | ORAL | Status: DC

## 2015-02-28 NOTE — ED Notes (Signed)
Pt  Has  Seasonal  allerygs    She  Reports   Symptoms  Of      Sensation of  The  Glands    Seem    Swollen    And      r  Earache           With         Body  acheas  As   Well   Pt  Has  Seasonal  allergys    And  Has  Been taking  Benadryl      For    The  Symptoms

## 2015-02-28 NOTE — ED Provider Notes (Signed)
CSN: [id number]     Arrival date & time 02/28/15  1734 History   First MD Initiated Contact with Patient 02/28/15 1856     Chief Complaint  Patient presents with  . Allergies   (Consider location/radiation/quality/duration/timing/severity/associated sxs/prior Treatment) Patient is a 34 y.o. female presenting with pharyngitis. The history is provided by the patient. No language interpreter was used.  Sore Throat This is a new problem. The current episode started 2 days ago. The problem occurs constantly. The problem has been gradually worsening. Pertinent negatives include no headaches and no shortness of breath. Nothing aggravates the symptoms. Nothing relieves the symptoms. She has tried nothing for the symptoms. The treatment provided no relief.   Pt complains of a sore throat, neck pain, neck soreness, bodyaches Past Medical History  Diagnosis Date  . Diabetes in pregnancy   . Acid reflux    Past Surgical History  Procedure Laterality Date  . Cesarean section     History reviewed. No pertinent family history. History  Substance Use Topics  . Smoking status: Former Games developermoker  . Smokeless tobacco: Never Used  . Alcohol Use: No   OB History    No data available     Review of Systems  HENT: Positive for ear pain.   Respiratory: Negative for shortness of breath.   Neurological: Negative for headaches.  All other systems reviewed and are negative.   Allergies  Latex  Home Medications   Prior to Admission medications   Medication Sig Start Date End Date Taking? Authorizing Provider  ibuprofen (ADVIL,MOTRIN) 800 MG tablet Take 1 tablet (800 mg total) by mouth 3 (three) times daily. 02/28/15   Elson AreasLeslie K Meaghen Vecchiarelli, PA-C   BP 106/72 mmHg  Pulse 86  Temp(Src) 98.3 F (36.8 C) (Oral)  Resp 24  SpO2 99% Physical Exam  Constitutional: She appears well-developed and well-nourished.  HENT:  Head: Normocephalic and atraumatic.  Nose: Nose normal.  Mouth/Throat: Oropharynx is clear  and moist.  Eyes: Conjunctivae and EOM are normal. Pupils are equal, round, and reactive to light.  Neck: Normal range of motion.  Cardiovascular: Normal rate.   Pulmonary/Chest: Effort normal.  Musculoskeletal: Normal range of motion.  Neurological: She is alert.  Skin: Skin is warm.  Psychiatric: She has a normal mood and affect.  Nursing note and vitals reviewed.   ED Course  Procedures (including critical care time) Labs Review Labs Reviewed - No data to display  Imaging Review No results found.   MDM   1. Viral illness    Ibuprofen Drink plenty of fluids.  Return if any problems. OOW x 2 days   Elson AreasLeslie K Haddie Bruhl, [place] JerseyPA-C 02/28/15 1942

## 2015-02-28 NOTE — Discharge Instructions (Signed)

## 2015-03-04 ENCOUNTER — Encounter (HOSPITAL_COMMUNITY): Payer: Self-pay | Admitting: Emergency Medicine

## 2015-03-04 ENCOUNTER — Emergency Department (HOSPITAL_COMMUNITY)
Admission: EM | Admit: 2015-03-04 | Discharge: 2015-03-04 | Disposition: A | Discharge status: Home / Self Care | Payer: BLUE CROSS/BLUE SHIELD | Attending: Emergency Medicine | Admitting: Emergency Medicine

## 2015-03-04 ENCOUNTER — Emergency Department (HOSPITAL_COMMUNITY): Payer: BLUE CROSS/BLUE SHIELD

## 2015-03-04 DIAGNOSIS — Z8632 Personal history of gestational diabetes: Secondary | ICD-10-CM | POA: Insufficient documentation

## 2015-03-04 DIAGNOSIS — Z91040 Latex allergy status: Secondary | ICD-10-CM | POA: Insufficient documentation

## 2015-03-04 DIAGNOSIS — J209 Acute bronchitis, unspecified: Secondary | ICD-10-CM | POA: Insufficient documentation

## 2015-03-04 DIAGNOSIS — R05 Cough: Secondary | ICD-10-CM | POA: Diagnosis present

## 2015-03-04 DIAGNOSIS — Z87891 Personal history of nicotine dependence: Secondary | ICD-10-CM | POA: Insufficient documentation

## 2015-03-04 DIAGNOSIS — Z791 Long term (current) use of non-steroidal anti-inflammatories (NSAID): Secondary | ICD-10-CM | POA: Diagnosis not present

## 2015-03-04 DIAGNOSIS — Z8719 Personal history of other diseases of the digestive system: Secondary | ICD-10-CM | POA: Insufficient documentation

## 2015-03-04 HISTORY — DX: Other seasonal allergic rhinitis: J30.2

## 2015-03-04 LAB — RAPID STREP SCREEN (MED CTR MEBANE ONLY): Streptococcus, Group A Screen (Direct): NEGATIVE

## 2015-03-04 MED ORDER — AZITHROMYCIN 250 MG PO TABS: 250.0000 mg | ORAL_TABLET | Freq: Every day | ORAL | Status: DC

## 2015-03-04 MED ORDER — DM-GUAIFENESIN ER 30-600 MG PO TB12: 1.0000 | ORAL_TABLET | Freq: Two times a day (BID) | ORAL | Status: DC

## 2015-03-04 MED ORDER — ACETAMINOPHEN 325 MG PO TABS
650.0000 mg | ORAL_TABLET | Freq: Once | ORAL | Status: AC
  Administered 2015-03-04: 650 mg via ORAL
  Filled 2015-03-04: qty 2

## 2015-03-04 NOTE — ED Provider Notes (Signed)
CSN: [id number]     Arrival date & time 03/04/15  0559 History   First MD Initiated Contact with Patient 03/04/15 [phone]     Chief Complaint  Patient presents with  . Influenza    symptoms   Deborah Jacobson is a 34 y.o. obese female With a history of seasonal allergies who presents to the emergency department complaining of flulike symptoms ongoing for the past week. The patient complains of sore throat, body aches, runny nose, nasal congestion, and productive cough. Patient reports she was seen at urgent care 4 days ago and is diagnosed with influenza. She reports she has gotten worse since then. She reports chills but denies fevers. She denies sick contacts. She reports taking TheraFlu last night without relief. The patient denies history of asthma or pneumonia. The patient did receive her flu shot this year. She reports she has been eating and drinking okay. The patient denies shortness of breath, nausea, vomiting, abdominal pain, headaches, dizziness, or rashes.  (Consider location/radiation/quality/duration/timing/severity/associated sxs/prior Treatment) HPI  Past Medical History  Diagnosis Date  . Diabetes in pregnancy   . Acid reflux   . Seasonal allergies    Past Surgical History  Procedure Laterality Date  . Cesarean section     History reviewed. No pertinent family history. History  Substance Use Topics  . Smoking status: Former Games developermoker  . Smokeless tobacco: Never Used  . Alcohol Use: No   OB History    No data available     Review of Systems  Constitutional: Positive for chills and fatigue. Negative for fever and appetite change.  HENT: Positive for congestion, postnasal drip, rhinorrhea, sneezing and sore throat. Negative for ear pain, mouth sores, sinus pressure and trouble swallowing.   Eyes: Negative for pain and visual disturbance.  Respiratory: Positive for cough. Negative for shortness of breath and wheezing.   Cardiovascular: Negative for chest pain.   Gastrointestinal: Negative for nausea, vomiting, abdominal pain and diarrhea.  Genitourinary: Negative for dysuria, urgency and frequency.  Musculoskeletal: Positive for arthralgias. Negative for back pain and neck pain.  Skin: Negative for rash.  Neurological: Negative for dizziness and headaches.      Allergies  Latex  Home Medications   Prior to Admission medications   Medication Sig Start Date End Date Taking? Authorizing Provider  azithromycin (ZITHROMAX Z-PAK) 250 MG tablet Take 1 tablet (250 mg total) by mouth daily. 500mg  PO day 1, then 250mg  PO days 205 03/04/15   Everlene FarrierWilliam Evamaria Detore, PA-C  dextromethorphan-guaiFENesin [facility](MUCINEX DM) 30-600 MG per 12 hr tablet Take 1 tablet by mouth 2 (two) times daily. 03/04/15   Everlene FarrierWilliam Taite Schoeppner, PA-C  ibuprofen (ADVIL,MOTRIN) 800 MG tablet Take 1 tablet (800 mg total) by mouth 3 (three) times daily. 02/28/15   Lonia SkinnerLeslie K Sofia, PA-C   BP 146/92 mmHg  Pulse 71  Temp(Src) 98.1 F (36.7 C) (Oral)  Resp 20  Ht 4\' 9"  (1.448 m)  Wt 221 lb 6 oz (100.415 kg)  BMI 47.89 kg/m2  SpO2 100%  LMP 02/04/2015 Physical Exam  Constitutional: She is oriented to person, place, and time. She appears well-developed and well-nourished. No distress.  HENT:  Head: Normocephalic and atraumatic.  Right Ear: External ear normal.  Left Ear: External ear normal.  Mouth/Throat: Oropharynx is clear and moist. No oropharyngeal exudate.  Bilateral tympanic membranes are pearly-gray without erythema or loss of landmarks. Mild oropharyngeal erythema without tonsillar hypertrophy or exudates.  Eyes: Conjunctivae are normal. Pupils are equal, round, and reactive to light. Right  eye exhibits no discharge. Left eye exhibits no discharge.  Neck: Normal range of motion. Neck supple. No JVD present. No tracheal deviation present.  Cardiovascular: Normal rate, regular rhythm, normal heart sounds and intact distal pulses.  Exam reveals no gallop and no friction rub.   No murmur  heard. Pulmonary/Chest: Effort normal and breath sounds normal. No respiratory distress. She has no wheezes. She has no rales. She exhibits tenderness.  Lungs are clear to auscultation bilaterally.  Abdominal: Soft. She exhibits no distension. There is no tenderness.  Musculoskeletal: She exhibits no edema.  Lymphadenopathy:    She has cervical adenopathy.  Neurological: She is alert and oriented to person, place, and time. Coordination normal.  Skin: Skin is warm and dry. No rash noted. She is not diaphoretic. No erythema. No pallor.  Psychiatric: She has a normal mood and affect. Her behavior is normal.  Nursing note and vitals reviewed.   ED Course  Procedures (including critical care time) Labs Review Labs Reviewed  RAPID STREP SCREEN  CULTURE, GROUP A STREP    Imaging Review Dg Chest 2 View  03/04/2015   CLINICAL DATA:  Productive cough. Influenza. Sore throat. Progressive symptoms.  EXAM: CHEST - 2 VIEW  COMPARISON:  Two-view chest x-ray 08/25/2013.  FINDINGS: The heart size is normal. Mild infrahilar bronchitic changes are evident bilaterally. Subtle lingular or left lower lobe airspace consolidation may represent developing bronchopneumonia superimposed. No other foci of consolidation are evident. The visualized soft tissues and bony thorax are unremarkable.  IMPRESSION: 1. Bilateral infrahilar bronchitic change. 2. Developing left lower lobe or lingular airspace disease may represent early pneumonia.   Electronically Signed   By: Marin Roberts M.D.   On: 03/04/2015 08:51     EKG Interpretation None      Filed Vitals:   03/04/15 0700 03/04/15 0715 03/04/15 0730 03/04/15 0906  BP: 107/61 113/51 135/90 146/92  Pulse: 58 73 82 71  Temp:      TempSrc:      Resp:    20  Height:      Weight:      SpO2: 100% 100% 100% 100%     MDM   Meds given in ED:  Medications  acetaminophen (TYLENOL) tablet 650 mg (650 mg Oral Given 03/04/15 0727)    New Prescriptions    AZITHROMYCIN (ZITHROMAX Z-PAK) 250 MG TABLET    Take 1 tablet (250 mg total) by mouth daily.  PO day 1, then  PO days 205   DEXTROMETHORPHAN-GUAIFENESIN (MUCINEX DM) 30-600 MG PER 12 HR TABLET    Take 1 tablet by mouth 2 (two) times daily.    Final diagnoses:  Acute bronchitis, unspecified organism   Patient with continued flulike symptoms ongoing for the past week. Patient seen in urgent care 3 days ago and diagnosed influenza. No chest x-ray was done at this time. On exam the patient is afebrile and nontoxic appearing. Her lungs are clear to auscultation bilaterally. To the patient having continued cough and flulike symptoms ongoing for the past week and this is a return visit from urgent care will obtain chest x-ray and rapid strep test. Rapid strep test is negative. Chest x-ray indicated bronchitic changes as well as a developing left lower lobe or lingular airspace disease that may represent early pneumonia. The patient is not tachypneic, tachycardic or hypoxic. The patient is afebrile. We'll discharge the patient with prescriptions for azithromycin and Mucinex for her cough. Advised patient she could continue taking ibuprofen for her body  aches. Strict return precautions provided. I advised the patient to follow-up with their primary care provider this week. I advised the patient to return to the emergency department with new or worsening symptoms or new concerns. The patient verbalized understanding and agreement with plan.   This patient was discussed with Dr. Radford Pax who agrees with assessment and plan.    Everlene Farrier, PA-C 03/04/15 1610  Nelva Nay, MD 03/07/15 1017

## 2015-03-04 NOTE — ED Notes (Signed)
Will, PA back in to speak with pt

## 2015-03-04 NOTE — ED Notes (Signed)
Pt returned from xray

## 2015-03-04 NOTE — Discharge Instructions (Signed)
Your Chest x-ray showed some early signs of possible pneumonia, so will treat with antibiotic today. Please finish all of your antibiotic.  Acute Bronchitis Bronchitis is inflammation of the airways that extend from the windpipe into the lungs (bronchi). The inflammation often causes mucus to develop. This leads to a cough, which is the most common symptom of bronchitis.  In acute bronchitis, the condition usually develops suddenly and goes away over time, usually in a couple weeks. Smoking, allergies, and asthma can make bronchitis worse. Repeated episodes of bronchitis may cause further lung problems.  CAUSES Acute bronchitis is most often caused by the same virus that causes a cold. The virus can spread from person to person (contagious) through coughing, sneezing, and touching contaminated objects. SIGNS AND SYMPTOMS   Cough.   Fever.   Coughing up mucus.   Body aches.   Chest congestion.   Chills.   Shortness of breath.   Sore throat.  DIAGNOSIS  Acute bronchitis is usually diagnosed through a physical exam. Your health care provider will also ask you questions about your medical history. Tests, such as chest X-rays, are sometimes done to rule out other conditions.  TREATMENT  Acute bronchitis usually goes away in a couple weeks. Oftentimes, no medical treatment is necessary. Medicines are sometimes given for relief of fever or cough. Antibiotic medicines are usually not needed but may be prescribed in certain situations. In some cases, an inhaler may be recommended to help reduce shortness of breath and control the cough. A cool mist vaporizer may also be used to help thin bronchial secretions and make it easier to clear the chest.  HOME CARE INSTRUCTIONS  Get plenty of rest.   Drink enough fluids to keep your urine clear or pale yellow (unless you have a medical condition that requires fluid restriction). Increasing fluids may help thin your respiratory secretions  (sputum) and reduce chest congestion, and it will prevent dehydration.   Take medicines only as directed by your health care provider.  If you were prescribed an antibiotic medicine, finish it all even if you start to feel better.  Avoid smoking and secondhand smoke. Exposure to cigarette smoke or irritating chemicals will make bronchitis worse. If you are a smoker, consider using nicotine gum or skin patches to help control withdrawal symptoms. Quitting smoking will help your lungs heal faster.   Reduce the chances of another bout of acute bronchitis by washing your hands frequently, avoiding people with cold symptoms, and trying not to touch your hands to your mouth, nose, or eyes.   Keep all follow-up visits as directed by your health care provider.  SEEK MEDICAL CARE IF: Your symptoms do not improve after 1 week of treatment.  SEEK IMMEDIATE MEDICAL CARE IF:  You develop an increased fever or chills.   You have chest pain.   You have severe shortness of breath.  You have bloody sputum.   You develop dehydration.  You faint or repeatedly feel like you are going to pass out.  You develop repeated vomiting.  You develop a severe headache. MAKE SURE YOU:   Understand these instructions.  Will watch your condition.  Will get help right away if you are not doing well or get worse. Document Released: 12/04/2004 Document Revised: 03/13/2014 Document Reviewed: 04/19/2013 [facility] Patient Information 2015 [place], [place]. This information is not intended to replace advice given to you by your health care provider. Make sure you discuss any questions you have with your health care provider.

## 2015-03-04 NOTE — ED Notes (Signed)
Was seen in Urgent care on Wednesday with the same symptoms.  Aches, all over, stuffy and runny nose.  Productive cough with green phlegm.  Also c/o sore throat.

## 2015-03-06 LAB — CULTURE, GROUP A STREP: Strep A Culture: NEGATIVE

## 2015-06-28 ENCOUNTER — Ambulatory Visit: Payer: BLUE CROSS/BLUE SHIELD | Admitting: Dietician

## 2015-09-24 ENCOUNTER — Emergency Department (HOSPITAL_COMMUNITY)
Admission: EM | Admit: 2015-09-24 | Discharge: 2015-09-24 | Disposition: A | Discharge status: Home / Self Care | Payer: BLUE CROSS/BLUE SHIELD | Attending: Emergency Medicine | Admitting: Emergency Medicine

## 2015-09-24 ENCOUNTER — Emergency Department (HOSPITAL_COMMUNITY): Payer: BLUE CROSS/BLUE SHIELD

## 2015-09-24 ENCOUNTER — Encounter (HOSPITAL_COMMUNITY): Payer: Self-pay | Admitting: Emergency Medicine

## 2015-09-24 DIAGNOSIS — I10 Essential (primary) hypertension: Secondary | ICD-10-CM | POA: Insufficient documentation

## 2015-09-24 DIAGNOSIS — Y9389 Activity, other specified: Secondary | ICD-10-CM | POA: Insufficient documentation

## 2015-09-24 DIAGNOSIS — W231XXA Caught, crushed, jammed, or pinched between stationary objects, initial encounter: Secondary | ICD-10-CM | POA: Insufficient documentation

## 2015-09-24 DIAGNOSIS — Z91040 Latex allergy status: Secondary | ICD-10-CM | POA: Insufficient documentation

## 2015-09-24 DIAGNOSIS — Z87891 Personal history of nicotine dependence: Secondary | ICD-10-CM | POA: Insufficient documentation

## 2015-09-24 DIAGNOSIS — S6992XA Unspecified injury of left wrist, hand and finger(s), initial encounter: Secondary | ICD-10-CM

## 2015-09-24 DIAGNOSIS — Y999 Unspecified external cause status: Secondary | ICD-10-CM | POA: Insufficient documentation

## 2015-09-24 DIAGNOSIS — Z8719 Personal history of other diseases of the digestive system: Secondary | ICD-10-CM | POA: Insufficient documentation

## 2015-09-24 DIAGNOSIS — Z792 Long term (current) use of antibiotics: Secondary | ICD-10-CM | POA: Insufficient documentation

## 2015-09-24 DIAGNOSIS — Z8632 Personal history of gestational diabetes: Secondary | ICD-10-CM | POA: Insufficient documentation

## 2015-09-24 DIAGNOSIS — Z79899 Other long term (current) drug therapy: Secondary | ICD-10-CM | POA: Insufficient documentation

## 2015-09-24 DIAGNOSIS — Y9289 Other specified places as the place of occurrence of the external cause: Secondary | ICD-10-CM | POA: Insufficient documentation

## 2015-09-24 HISTORY — DX: Essential (primary) hypertension: I10

## 2015-09-24 MED ORDER — IBUPROFEN 800 MG PO TABS: 800.0000 mg | ORAL_TABLET | Freq: Three times a day (TID) | ORAL | Status: DC

## 2015-09-24 MED ORDER — IBUPROFEN 400 MG PO TABS
800.0000 mg | ORAL_TABLET | Freq: Once | ORAL | Status: AC
  Administered 2015-09-24: 800 mg via ORAL
  Filled 2015-09-24: qty 2

## 2015-09-24 NOTE — ED Provider Notes (Signed)
CSN: [id number]     Arrival date & time 09/24/15  [id number] History  By signing my name below, I, Deborah Jacobson, attest that this documentation has been prepared under the direction and in the presence of Melburn HakeNicole Jacobson, [place]-C. Electronically Signed: Marica OtterNusrat Jacobson, ED Scribe. 09/24/2015. 9:22 AM.  Chief Complaint  Patient presents with  . Hand Injury   The history is provided by the patient. No language interpreter was used.   PCP: Truman Haywardakia S Starkes, FNP HPI Comments: Deborah Jacobson is a 34 y.o. female, with PMHx noted below, who presents to the Emergency Department complaining of traumatic, sudden onset, aching, 8/10 pain to left 3rd and 4th digits with associated swelling onset last night after a door jammed into patient's left hand. Pt reports she is unable to make a fist with her left hand secondary to severe pain to her 3rd and 4th digits. Pt reports icing the affected area last night and it worsened the pain. Pt denies taking any meds at home to alleviate her Sx. Associated Sx include mild numbness and tingling to the left 3rd and 4th digits last night which has now resolved. Pt denies any prior injuries to the affected area.   Past Medical History  Diagnosis Date  . Diabetes in pregnancy (HCC)   . Acid reflux   . Seasonal allergies   . Hypertension    Past Surgical History  Procedure Laterality Date  . Cesarean section     No family history on file. Social History  Substance Use Topics  . Smoking status: Former Games developermoker  . Smokeless tobacco: Never Used  . Alcohol Use: No   OB History    No data available     Review of Systems  Constitutional: Negative for fever.  Musculoskeletal: Positive for joint swelling and arthralgias (left hand pain).  Neurological: Negative for numbness.   Allergies  Latex  Home Medications   Prior to Admission medications   Medication Sig Start Date End Date Taking? Authorizing Provider  azithromycin (ZITHROMAX Z-PAK) 250 MG tablet Take 1 tablet  (250 mg total) by mouth daily. 500mg  PO day 1, then 250mg  PO days 205 03/04/15   [name], PA-C  dextromethorphan-guaiFENesin [facility](MUCINEX DM) 30-600 MG per 12 hr tablet Take 1 tablet by mouth 2 (two) times daily. 03/04/15   [name], PA-C  ibuprofen (ADVIL,MOTRIN) 800 MG tablet Take 1 tablet (800 mg total) by mouth 3 (three) times daily. 09/24/15   Barrett HenleNicole Elizabeth Nadeau, PA-C   Triage Vitals: BP 122/90 mmHg  Pulse 75  Temp(Src) 97.8 F (36.6 C) (Oral)  Resp 18  Ht 4\' 9"  (1.448 m)  Wt 224 lb (101.606 kg)  BMI 48.46 kg/m2  SpO2 98% Physical Exam  Constitutional: She is oriented to person, place, and time. She appears well-developed and well-nourished.  HENT:  Head: Normocephalic.  Eyes: EOM are normal.  Neck: Normal range of motion.  Pulmonary/Chest: Effort normal.  Abdominal: She exhibits no distension.  Musculoskeletal: Normal range of motion.       Left hand: She exhibits tenderness. She exhibits normal two-point discrimination, normal capillary refill, no deformity, no laceration and no swelling. Normal sensation noted. Decreased strength (due to pain) noted.       Hands: 2+ radial pulses. Pt unable to make a fist with left hand due to pain, however, full passive ROM noted to left digits and wrist. Cap refill less than 2 seconds.   Neurological: She is alert and oriented to person, place, and time.  Psychiatric:  She has a normal mood and affect.  Nursing note and vitals reviewed.  ED Course  Procedures (including critical care time) DIAGNOSTIC STUDIES: Oxygen Saturation is 98% on ra, nl by my interpretation.    COORDINATION OF CARE: 9:16 AM: Discussed treatment plan which includes imaging results, work note limiting activity, and ibuprofen use at home with pt at bedside; patient verbalizes understanding and agrees with treatment plan. Pt advised to f/u immediately if Sx worsen.   Imaging Review Dg Hand Complete Left  09/24/2015  CLINICAL DATA:  Jammed left middle  and ring finger on door last evening with persistent pain. Initial encounter. EXAM: LEFT HAND - COMPLETE 3+ VIEW COMPARISON:  None. FINDINGS: No fracture or dislocation. Joint spaces are preserved. No erosions. Regional soft tissues appear normal. No radiopaque foreign body. No evidence of chondrocalcinosis. IMPRESSION: No fracture, dislocation or radiopaque foreign body. Electronically Signed   By: Deborah Jacobson M.D.   On: 09/24/2015 09:10   I have personally reviewed and evaluated these images as part of my medical decision-making.  MDM   Final diagnoses:  Hand injury, left, initial encounter    Pt presents with left 3rd and 4th digit pain s/p getting her hand jammed by a door last night. No relief with ice. VSS. Exam revealed TTP at left 3rd and 4th MCP joints and proximal phalanx, no swelling/ecchymoses/lacerations/abrasions. Left hand neurovascularly intact. Left hand xray negative. Plan to d/c pt home with NSAIDs. Advised pt to continue using ice as needed for pain relief and to follow up with PCP.  Evaluation does not show pathology requring ongoing emergent intervention or admission. Pt is hemodynamically stable and mentating appropriately. Discussed findings/results and plan with patient/guardian, who agrees with plan. All questions answered. Return precautions discussed and outpatient follow up given.    I personally performed the services described in this documentation, which was scribed in my presence. The recorded information has been reviewed and is accurate.    Deborah Jacobson, [place] 09/24/15 2001  Geoffery Lyons, MD 09/25/15 [phone]

## 2015-09-24 NOTE — Discharge Instructions (Signed)
Take your medications as prescribed as needed for pain relief. He may also continue applying ice for 15-20 minutes to your affected hand 3-4 times daily. Follow-up with your primary care provider in 4-5 days. Return to the emergency department if symptoms worsen or new onset of fever, swelling, redness, warmth, numbness, tingling, weakness.

## 2015-09-24 NOTE — ED Notes (Signed)
Patient states door slammed on hand last night.   Patient states can't make a fist, 3rd and 4th fingers hurt too bad.   Patient states she did ice last night.

## 2016-02-04 ENCOUNTER — Ambulatory Visit
Admission: RE | Admit: 2016-02-04 | Discharge: 2016-02-04 | Disposition: A | Discharge status: Home / Self Care | Payer: No Typology Code available for payment source | Source: Ambulatory Visit | Attending: Infectious Disease | Admitting: Infectious Disease

## 2016-02-04 ENCOUNTER — Other Ambulatory Visit: Payer: Self-pay | Admitting: Infectious Disease

## 2016-02-04 DIAGNOSIS — R7612 Nonspecific reaction to cell mediated immunity measurement of gamma interferon antigen response without active tuberculosis: Secondary | ICD-10-CM

## 2016-04-09 ENCOUNTER — Encounter (HOSPITAL_COMMUNITY): Payer: Self-pay

## 2016-04-09 ENCOUNTER — Emergency Department (HOSPITAL_COMMUNITY)
Admission: EM | Admit: 2016-04-09 | Discharge: 2016-04-09 | Disposition: A | Discharge status: Home / Self Care | Payer: BLUE CROSS/BLUE SHIELD | Attending: Emergency Medicine | Admitting: Emergency Medicine

## 2016-04-09 DIAGNOSIS — Z792 Long term (current) use of antibiotics: Secondary | ICD-10-CM | POA: Insufficient documentation

## 2016-04-09 DIAGNOSIS — Z87891 Personal history of nicotine dependence: Secondary | ICD-10-CM | POA: Insufficient documentation

## 2016-04-09 DIAGNOSIS — Z791 Long term (current) use of non-steroidal anti-inflammatories (NSAID): Secondary | ICD-10-CM | POA: Insufficient documentation

## 2016-04-09 DIAGNOSIS — Z91040 Latex allergy status: Secondary | ICD-10-CM | POA: Insufficient documentation

## 2016-04-09 DIAGNOSIS — J069 Acute upper respiratory infection, unspecified: Secondary | ICD-10-CM | POA: Diagnosis not present

## 2016-04-09 DIAGNOSIS — I10 Essential (primary) hypertension: Secondary | ICD-10-CM | POA: Diagnosis not present

## 2016-04-09 DIAGNOSIS — Z79899 Other long term (current) drug therapy: Secondary | ICD-10-CM | POA: Diagnosis not present

## 2016-04-09 DIAGNOSIS — J029 Acute pharyngitis, unspecified: Secondary | ICD-10-CM | POA: Diagnosis present

## 2016-04-09 MED ORDER — LORATADINE 10 MG PO TABS: 10.0000 mg | ORAL_TABLET | Freq: Every day | ORAL | Status: DC

## 2016-04-09 MED ORDER — BENZONATATE 100 MG PO CAPS: 100.0000 mg | ORAL_CAPSULE | Freq: Three times a day (TID) | ORAL | Status: DC

## 2016-04-09 NOTE — ED Notes (Signed)
Pt presents with 3-4 day h/o difficulty swallowing.  Pt denies any cold symptoms,  Or sick contacts.

## 2016-04-09 NOTE — Discharge Instructions (Signed)
You have been seen today for cough and sore throat. Your symptoms are consistent with a viral illness. Viruses do not require antibiotics. Treatment is symptomatic care. Drink plenty of fluids and get plenty of rest. You should be drinking at least a liter of water an hour to stay hydrated. Ibuprofen or Tylenol for pain or fever. Tessalon for cough. Plain Mucinex may help relieve congestion. Warm liquids or Chloraseptic spray may help soothe the sore throat. Follow up with PCP as needed should symptoms continue. Return to ED should symptoms worsen.

## 2016-04-09 NOTE — ED Provider Notes (Signed)
CSN: [id number]     Arrival date & time 04/09/16  1012 History  By signing my name below, I, Renetta ChalkBobby Ross, attest that this documentation has been prepared under the direction and in the presence of Denaya Horn PA-C Electronically Signed: Renetta ChalkBobby Ross, ED Scribe. 04/06/2016. 4:01 PM.  No chief complaint on file.  The history is provided by the patient. No language interpreter was used.   HPI Comments: Deborah Jacobson is a 35 y.o. female with a history of seasonal allergies who presents to the Emergency Department complaining of constant, gradually worsening sore throat onset two days ago. Pt describes the sore throat as itchy. Pt reports associated cough and bilateral ear popping. Pt has taken Benadryl and has been gargling with peroxide with no relief. Pt denies fever, postnasal drip, drainage from ear, nausea/vomiting, difficulty breathing or swallowing, or any other complaints. Pt denies taking any new medications.  Past Medical History  Diagnosis Date  . Diabetes in pregnancy (HCC)   . Acid reflux   . Seasonal allergies   . Hypertension    Past Surgical History  Procedure Laterality Date  . Cesarean section     History reviewed. No pertinent family history. Social History  Substance Use Topics  . Smoking status: Former Games developermoker  . Smokeless tobacco: Never Used  . Alcohol Use: No   OB History    No data available     Review of Systems  Constitutional: Negative for fever and chills.  HENT: Positive for sore throat. Negative for ear discharge and postnasal drip.   Respiratory: Positive for cough.   All other systems reviewed and are negative.   Allergies  Latex  Home Medications   Prior to Admission medications   Medication Sig Start Date End Date Taking? Authorizing Provider  azithromycin (ZITHROMAX Z-PAK) 250 MG tablet Take 1 tablet (250 mg total) by mouth daily. 500mg  PO day 1, then 250mg  PO days 205 03/04/15   [name], PA-C  benzonatate (TESSALON) 100 MG capsule Take  1 capsule (100 mg total) by mouth every 8 (eight) hours. 04/09/16   Rollan Roger C Kassia Demarinis, PA-C  dextromethorphan-guaiFENesin (MUCINEX DM) 30-600 MG per 12 hr tablet Take 1 tablet by mouth 2 (two) times daily. 03/04/15   [name], PA-C  ibuprofen (ADVIL,MOTRIN) 800 MG tablet Take 1 tablet (800 mg total) by mouth 3 (three) times daily. 09/24/15   Barrett HenleNicole Elizabeth Nadeau, PA-C  loratadine (CLARITIN) 10 MG tablet Take 1 tablet (10 mg total) by mouth daily. 04/09/16   Turki Tapanes C Arael Piccione, PA-C   BP 129/73 mmHg  Pulse 79  Temp(Src) 98.6 F (37 C) (Oral)  Resp 18  SpO2 99%  LMP 03/11/2016 (Approximate) Physical Exam  Constitutional: She is oriented to person, place, and time. She appears well-developed and well-nourished. No distress.  HENT:  Head: Normocephalic and atraumatic.  Right Ear: Tympanic membrane, external ear and ear canal normal.  Left Ear: Tympanic membrane, external ear and ear canal normal.  Mouth/Throat: Uvula is midline, oropharynx is clear and moist and mucous membranes are normal. No oropharyngeal exudate, posterior oropharyngeal edema or posterior oropharyngeal erythema.  Eyes: Conjunctivae are normal. Pupils are equal, round, and reactive to light.  Neck: Normal range of motion. Neck supple.  Cardiovascular: Normal rate, regular rhythm, normal heart sounds and intact distal pulses.   Pulmonary/Chest: Effort normal and breath sounds normal. No respiratory distress.  Abdominal: Soft. There is no tenderness. There is no guarding.  Musculoskeletal: She exhibits no edema or tenderness.  Lymphadenopathy:  She has no cervical adenopathy.  Neurological: She is alert and oriented to person, place, and time.  Skin: Skin is warm and dry. She is not diaphoretic.  Psychiatric: She has a normal mood and affect. Her behavior is normal.  Nursing note and vitals reviewed.   ED Course  Procedures  DIAGNOSTIC STUDIES: Oxygen Saturation is 99% on RA, normal by my interpretation.  COORDINATION OF  CARE: 10:37 AM Advised to take Claritin and gargle with salt water. Discussed treatment plan which includes with pt at bedside and pt agreed to plan.   MDM   Final diagnoses:  URI (upper respiratory infection)    Deborah Pel presents with sore throat and cough for the last 2 days.  Patient's presentation is consistent with an upper respiratory infection. No red flag symptoms or signs of sepsis. Symptomatic home care and return precautions discussed. Patient voiced understanding of these instructions and is comfortable with discharge.  I personally performed the services described in this documentation, which was scribed in my presence. The recorded information has been reviewed and is accurate.    [name], PA-C 04/09/16 1839  Pricilla Loveless, MD 04/10/16 0730

## 2016-04-30 ENCOUNTER — Emergency Department (HOSPITAL_COMMUNITY)
Admission: EM | Admit: 2016-04-30 | Discharge: 2016-04-30 | Disposition: A | Discharge status: Home / Self Care | Payer: BLUE CROSS/BLUE SHIELD | Attending: Emergency Medicine | Admitting: Emergency Medicine

## 2016-04-30 ENCOUNTER — Encounter (HOSPITAL_COMMUNITY): Payer: Self-pay

## 2016-04-30 DIAGNOSIS — Z87891 Personal history of nicotine dependence: Secondary | ICD-10-CM | POA: Diagnosis not present

## 2016-04-30 DIAGNOSIS — H02842 Edema of right lower eyelid: Secondary | ICD-10-CM | POA: Diagnosis not present

## 2016-04-30 DIAGNOSIS — I10 Essential (primary) hypertension: Secondary | ICD-10-CM | POA: Insufficient documentation

## 2016-04-30 DIAGNOSIS — Z79899 Other long term (current) drug therapy: Secondary | ICD-10-CM | POA: Diagnosis not present

## 2016-04-30 DIAGNOSIS — Z91040 Latex allergy status: Secondary | ICD-10-CM | POA: Insufficient documentation

## 2016-04-30 DIAGNOSIS — L03211 Cellulitis of face: Secondary | ICD-10-CM | POA: Diagnosis not present

## 2016-04-30 MED ORDER — CEPHALEXIN 500 MG PO CAPS: 500.0000 mg | ORAL_CAPSULE | Freq: Four times a day (QID) | ORAL | Status: DC

## 2016-04-30 MED ORDER — IBUPROFEN 800 MG PO TABS: 800.0000 mg | ORAL_TABLET | Freq: Three times a day (TID) | ORAL | Status: DC

## 2016-04-30 MED ORDER — VALACYCLOVIR HCL 1 G PO TABS: 1000.0000 mg | ORAL_TABLET | Freq: Three times a day (TID) | ORAL | Status: AC

## 2016-04-30 NOTE — Discharge Instructions (Signed)
Take both Valtrex and Keflex as prescribed until all gone. Take ibuprofen for pain. Apply bacitracin ointment topically twice a day. Follow-up with your doctor. Return if worsening swelling, pain, visual changes.  Herpes Simplex Virus Herpes simplex virus is a viral infection that may infect many different areas of the body, such as the genitalia and mouth. There are two different strains of the virus: herpes simplex virus 1 (HSV-1) and herpes simplex virus 2 (HSV-2). HSV-1 is typically associated with infections of the mouth and lips. HSV-2 is associated with infections of the genitals. However, either strain of the virus may infect any area. HSV may be spread through saliva particles or sexual contact. One unusual form of HSV-1, known as herpes gladiatorum, is passed from skin-to-skin contact, such as in wrestling. SYMPTOMS   Sometimes, no symptoms.  Fever.  Headache.  Muscle aches.  Tingling.  Itching.  Tenderness.  Genital burning feeling.  Genital pain.  Pain with urination.  Pain with sexual intercourse.  Small blisters in the affected areas. RISK FACTORS   Kissing an infected person.  Sharing eating utensils with an infected person.  Unprotected sexual activity.  Multiple sexual partners.  Direct contact sports without protective clothing.  Contact with an exposed herpes sore.  Stress, illness, and cold increase the risk of recurrence. PROGNOSIS  The primary outbreak of an HSV infection usually lasts 2 to 3 weeks. However, it has been known to last up to 6 weeks. After the primary outbreak subsides, the virus goes into a stage known as latency. During this time, there may be no physical symptoms of infection. After a period of time, some event, such as stress, cold, or illness will trigger another outbreak. This cycle of latency and outbreak may continue indefinitely. The outbreaks usually become milder over time. The body cannot rid itself of HSV. RELATED  COMPLICATIONS   Recurrence.  Infection in other areas of the body, such as the eye (ocular herpetic infection, keratitis) and rarely the brain (herpetic encephalitis). TREATMENT  Many HSV infections can be treated without medicine. During an outbreak, avoid touching the sores. Ice may be used to dull the pain and suppress the virus. Exposure to the sun is a common trigger for an outbreak, so the use of sunscreen may help in such cases. Avoid sexual contact during outbreaks. During the latent periods, it is advised that you use latex condoms, which will reduce the likelihood of spreading the virus to another person. Condoms made from animal products do not protect against HSV. Female condoms cover a larger area than female condoms, and may offer the most protection from the transmission of HSV. The presence of HSV will not affect a condom's ability to protect against pregnancy. Only take medicines for pain and discomfort if directed to do so by your caregiver. Many claims exist that certain dietary changes will prevent an outbreak, but these claims have not been proven. These claims include eating foods that are high in L-lysine and low in arginine (i.e. yogurt, beets, apples, pears, mangoes, oily fish (such as salmon, haddock, snapper, and swordfish), soybean sprouts, chicken, and tomatoes).  Athletes may return to play once they are showing no symptoms, and they have been treated.    This information is not intended to replace advice given to you by your health care provider. Make sure you discuss any questions you have with your health care provider.   Document Released: 10/27/2005 Document Revised: 01/19/2012 Document Reviewed: 05/16/2015 Elsevier Interactive Patient Education [employer]  Cellulitis Cellulitis is an infection of the skin and the tissue beneath it. The infected area is usually red and tender. Cellulitis occurs most often in the arms and lower legs.  CAUSES  Cellulitis is  caused by bacteria that enter the skin through cracks or cuts in the skin. The most common types of bacteria that cause cellulitis are staphylococci and streptococci. SIGNS AND SYMPTOMS   Redness and warmth.  Swelling.  Tenderness or pain.  Fever. DIAGNOSIS  Your health care provider can usually determine what is wrong based on a physical exam. Blood tests may also be done. TREATMENT  Treatment usually involves taking an antibiotic medicine. HOME CARE INSTRUCTIONS   Take your antibiotic medicine as directed by your health care provider. Finish the antibiotic even if you start to feel better.  Keep the infected arm or leg elevated to reduce swelling.  Apply a warm cloth to the affected area up to 4 times per day to relieve pain.  Take medicines only as directed by your health care provider.  Keep all follow-up visits as directed by your health care provider. SEEK MEDICAL CARE IF:   You notice red streaks coming from the infected area.  Your red area gets larger or turns dark in color.  Your bone or joint underneath the infected area becomes painful after the skin has healed.  Your infection returns in the same area or another area.  You notice a swollen bump in the infected area.  You develop new symptoms.  You have a fever. SEEK IMMEDIATE MEDICAL CARE IF:   You feel very sleepy.  You develop vomiting or diarrhea.  You have a general ill feeling (malaise) with muscle aches and pains.   This information is not intended to replace advice given to you by your health care provider. Make sure you discuss any questions you have with your health care provider.   Document Released: 08/06/2005 Document Revised: 07/18/2015 Document Reviewed: 01/12/2012 Elsevier Interactive Patient Education [employer]

## 2016-04-30 NOTE — ED Notes (Signed)
Pt presents with redness under R eyelid x 3 days.  Pt unsure of insect bite, describes as a burning.  Pt applying warm compresses; denies any vision change.

## 2016-04-30 NOTE — ED Provider Notes (Signed)
CSN: [id number]     Arrival date & time 04/30/16  1010 History  By signing my name below, I, Rosario Adie, attest that this documentation has been prepared under the direction and in the presence of Glinda Natzke, PA-C.   Electronically Signed: Rosario Adie, ED Scribe. 04/30/2016. 10:56 AM.   No chief complaint on file.  The history is provided by the patient. No language interpreter was used.   HPI Comments: Deborah Jacobson is a 35 y.o. female who presents to the Emergency Department complaining of gradual onset, gradually worsening, intermittent, burning, 8/10 right eye pain onset 3 days ago. She reports that she has associated erythema under her R eyelid, and edema under her eye. She has tried applying warm compresses PTA with mild relief of her symptoms. No OTC medications tried PTA. Pt reports that her pain is aggravated by moving her eye. She states that she woke up with the burning pain 3 days ago, and it has intermittently resolved and come back progressively worsened over the past couple of days. She denies her eye being pruritic. Pt does not have a hx of shingles. She denies visual changes, rashes, or any other symptoms.     Past Medical History  Diagnosis Date  . Diabetes in pregnancy (HCC)   . Acid reflux   . Seasonal allergies   . Hypertension    Past Surgical History  Procedure Laterality Date  . Cesarean section     History reviewed. No pertinent family history. Social History  Substance Use Topics  . Smoking status: Former Games developer  . Smokeless tobacco: Never Used  . Alcohol Use: No   OB History    No data available     Review of Systems  Constitutional: Negative for fever.  HENT: Positive for facial swelling.   Eyes: Negative for photophobia, pain (right), redness (right), itching and visual disturbance.  Skin: Positive for rash.   Allergies  Latex  Home Medications   Prior to Admission medications   Medication Sig Start Date End Date  Taking? Authorizing Provider  azithromycin (ZITHROMAX Z-PAK) 250 MG tablet Take 1 tablet (250 mg total) by mouth daily.  PO day 1, then  PO days 205 03/04/15   Everlene Farrier, PA-C  benzonatate (TESSALON) 100 MG capsule Take 1 capsule (100 mg total) by mouth every 8 (eight) hours. 04/09/16   [name], PA-C  dextromethorphan-guaiFENesin (MUCINEX DM) 30-600 MG per 12 hr tablet Take 1 tablet by mouth 2 (two) times daily. 03/04/15   Everlene Farrier, PA-C  ibuprofen (ADVIL,MOTRIN) 800 MG tablet Take 1 tablet (800 mg total) by mouth 3 (three) times daily. 09/24/15   Barrett Henle, PA-C  loratadine (CLARITIN) 10 MG tablet Take 1 tablet (10 mg total) by mouth daily. 04/09/16   [name], PA-C   BP 133/98 mmHg  Pulse 83  Temp(Src) 98.7 F (37.1 C) (Oral)  Resp 20  SpO2 100%  LMP 04/30/2016 (Exact Date)   Physical Exam  Constitutional: She appears well-developed and well-nourished.  HENT:  Head: Normocephalic.  Eyes: Conjunctivae and EOM are normal. Pupils are equal, round, and reactive to light.  Approximately area of 2 x 1 cm of swelling, erythema, and tiny papules to the right face, just below the right eyelid. Area is very tender to palpation. No drainage. No pain with extraocular movement.  Cardiovascular: Normal rate.   Pulmonary/Chest: Effort normal. No respiratory distress.  Abdominal: She exhibits no distension.  Musculoskeletal: Normal range of motion.  Neurological: She is alert.  Skin: Skin is warm and dry.  Psychiatric: She has a normal mood and affect. Her behavior is normal.  Nursing note and vitals reviewed.  ED Course  Procedures (including critical care time)  DIAGNOSTIC STUDIES: Oxygen Saturation is 100% on RA, normal by my interpretation.   COORDINATION OF CARE: 10:53 AM-Discussed next steps with pt including antibiotics. Pt verbalized understanding and is agreeable with the plan.   MDM   Final diagnoses:  Cellulitis of face    Patient with  a rash and swelling just below right eyelid. No visual changes. Area is very tender and she describes it as burning. Concerning for hepatic rash versus cellulitis. We'll cover with Keflex and Valtrex. I am concerned this could be early shingles given burning sensation and severe tenderness on palpation. I will have her follow-up with her doctor as needed. Return precautions discussed  Filed Vitals:   04/30/16 1016  BP: 133/98  Pulse: 83  Temp: 98.7 F (37.1 C)  TempSrc: Oral  Resp: 20  SpO2: 100%    I personally performed the services described in this documentation, which was scribed in my presence. The recorded information has been reviewed and is accurate.   Jaynie Crumbleatyana Shilynn Hoch, PA-C 04/30/16 1640  Rolland PorterMark James, MD 05/07/16 [phone]

## 2016-06-04 DIAGNOSIS — F331 Major depressive disorder, recurrent, moderate: Secondary | ICD-10-CM | POA: Diagnosis not present

## 2016-06-09 DIAGNOSIS — F329 Major depressive disorder, single episode, unspecified: Secondary | ICD-10-CM | POA: Diagnosis not present

## 2016-06-09 DIAGNOSIS — R030 Elevated blood-pressure reading, without diagnosis of hypertension: Secondary | ICD-10-CM | POA: Diagnosis not present

## 2016-06-09 DIAGNOSIS — E669 Obesity, unspecified: Secondary | ICD-10-CM | POA: Diagnosis not present

## 2016-06-09 DIAGNOSIS — Z6841 Body Mass Index (BMI) 40.0 and over, adult: Secondary | ICD-10-CM | POA: Diagnosis not present

## 2016-06-25 DIAGNOSIS — F4310 Post-traumatic stress disorder, unspecified: Secondary | ICD-10-CM | POA: Diagnosis not present

## 2016-07-23 DIAGNOSIS — F4310 Post-traumatic stress disorder, unspecified: Secondary | ICD-10-CM | POA: Diagnosis not present

## 2016-07-24 DIAGNOSIS — F4310 Post-traumatic stress disorder, unspecified: Secondary | ICD-10-CM | POA: Diagnosis not present

## 2016-07-30 DIAGNOSIS — F4310 Post-traumatic stress disorder, unspecified: Secondary | ICD-10-CM | POA: Diagnosis not present

## 2016-08-26 ENCOUNTER — Encounter (HOSPITAL_COMMUNITY): Payer: Self-pay | Admitting: Emergency Medicine

## 2016-08-26 ENCOUNTER — Emergency Department (HOSPITAL_COMMUNITY)
Admission: EM | Admit: 2016-08-26 | Discharge: 2016-08-26 | Disposition: A | Discharge status: Home / Self Care | Payer: BLUE CROSS/BLUE SHIELD | Attending: Emergency Medicine | Admitting: Emergency Medicine

## 2016-08-26 DIAGNOSIS — M545 Low back pain, unspecified: Secondary | ICD-10-CM

## 2016-08-26 DIAGNOSIS — Z91040 Latex allergy status: Secondary | ICD-10-CM | POA: Diagnosis not present

## 2016-08-26 DIAGNOSIS — Z87891 Personal history of nicotine dependence: Secondary | ICD-10-CM | POA: Insufficient documentation

## 2016-08-26 DIAGNOSIS — Y929 Unspecified place or not applicable: Secondary | ICD-10-CM | POA: Insufficient documentation

## 2016-08-26 DIAGNOSIS — Y939 Activity, unspecified: Secondary | ICD-10-CM | POA: Insufficient documentation

## 2016-08-26 DIAGNOSIS — S161XXA Strain of muscle, fascia and tendon at neck level, initial encounter: Secondary | ICD-10-CM | POA: Diagnosis not present

## 2016-08-26 DIAGNOSIS — X58XXXA Exposure to other specified factors, initial encounter: Secondary | ICD-10-CM | POA: Diagnosis not present

## 2016-08-26 DIAGNOSIS — Y990 Civilian activity done for income or pay: Secondary | ICD-10-CM | POA: Diagnosis not present

## 2016-08-26 DIAGNOSIS — I10 Essential (primary) hypertension: Secondary | ICD-10-CM | POA: Diagnosis not present

## 2016-08-26 DIAGNOSIS — S39012A Strain of muscle, fascia and tendon of lower back, initial encounter: Secondary | ICD-10-CM | POA: Diagnosis not present

## 2016-08-26 DIAGNOSIS — S3992XA Unspecified injury of lower back, initial encounter: Secondary | ICD-10-CM | POA: Diagnosis not present

## 2016-08-26 DIAGNOSIS — T148XXA Other injury of unspecified body region, initial encounter: Secondary | ICD-10-CM

## 2016-08-26 MED ORDER — METHOCARBAMOL 500 MG PO TABS: 500.0000 mg | ORAL_TABLET | Freq: Two times a day (BID) | ORAL | 0 refills | Status: DC

## 2016-08-26 NOTE — Discharge Instructions (Signed)
Please read and follow all provided instructions.  Your diagnoses today include:  1. Acute left-sided low back pain without sciatica   2. Muscle strain    Tests performed today include: Vital signs - see below for your results today  Medications prescribed:   Take any prescribed medications only as directed.  Home care instructions:  Follow any educational materials contained in this packet Please rest, use ice or heat on your back for the next several days Do not lift, push, pull anything more than 10 pounds for the next week  Follow-up instructions: Please follow-up with your primary care provider in the next 1 week for further evaluation of your symptoms.   Return instructions:  SEEK IMMEDIATE MEDICAL ATTENTION IF YOU HAVE: New numbness, tingling, weakness, or problem with the use of your arms or legs Severe back pain not relieved with medications Loss control of your bowels or bladder Increasing pain in any areas of the body (such as chest or abdominal pain) Shortness of breath, dizziness, or fainting.  Worsening nausea (feeling sick to your stomach), vomiting, fever, or sweats Any other emergent concerns regarding your health   Additional Information:  Your vital signs today were: BP 125/84    Pulse 90    Temp 98.3 F (36.8 C) (Oral)    Resp 20    Ht 4\' 9"  (1.448 m)    Wt 97.1 kg    LMP 08/22/2016    SpO2 99%    BMI 46.31 kg/m  If your blood pressure (BP) was elevated above 135/85 this visit, please have this repeated by your doctor within one month. --------------

## 2016-08-26 NOTE — ED Notes (Signed)
[facility] COUPON GIVEN 103

## 2016-08-26 NOTE — ED Triage Notes (Signed)
Patient states "I hurt myself at work about 2 weeks ago", but couldn't give me any details.  Patient states has been taking biofreeze and ibuprofen for pain and it helps some.  Patient states now is not working.

## 2016-08-26 NOTE — ED Provider Notes (Signed)
[facility] Provider Note   CSN: [id number] Arrival date & time: 08/26/16  1030  By signing my name below, I, Deborah Jacobson, attest that this documentation has been prepared under the direction and in the presence of Deborah Piliyler Chenay Nesmith, PA-C. Electronically Signed: Sonum Jacobson, Neurosurgeoncribe. 08/26/16. 11:26 AM.  History   Chief Complaint Chief Complaint  Patient presents with  . Neck Pain  . Back Pain    The history is provided by the patient. No language interpreter was used.     HPI Comments: Deborah Jacobson is a 35 y.o. female who presents to the Emergency Department complaining of gradual onset, constant, gradually worsening posterior neck pain for the past 2 weeks that has gradually progressed to the lower back. 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 She has applied a heating pad and Biofreeze and taken ibuprofen 800 mg without significant relief. She denies bowel/bladder incontinence, fever, saddle anesthesia.   Past Medical History:  Diagnosis Date  . Acid reflux   . Diabetes in pregnancy   . Hypertension   . Seasonal allergies     Patient Active Problem List   Diagnosis Date Noted  . Plantar fasciitis of left foot 03/01/2014  . Porokeratosis 03/01/2014  . Pain in lower limb 03/01/2014    Past Surgical History:  Procedure Laterality Date  . CESAREAN SECTION      OB History    No data available       Home Medications    Prior to Admission medications   Medication Sig Start Date End Date Taking? Authorizing Provider  azithromycin (ZITHROMAX Z-PAK) 250 MG tablet Take 1 tablet (250 mg total) by mouth daily. 500mg  PO day 1, then 250mg  PO days 205 03/04/15   [name], PA-C  benzonatate (TESSALON) 100 MG capsule Take 1 capsule (100 mg total) by mouth every 8 (eight) hours. 04/09/16   [name], PA-C  cephALEXin (KEFLEX) 500 MG capsule Take 1 capsule (500 mg total) by mouth 4 (four) times daily. 04/30/16   [name], PA-C    dextromethorphan-guaiFENesin (MUCINEX DM) 30-600 MG per 12 hr tablet Take 1 tablet by mouth 2 (two) times daily. 03/04/15   [name], PA-C  ibuprofen (ADVIL,MOTRIN) 800 MG tablet Take 1 tablet (800 mg total) by mouth 3 (three) times daily. 04/30/16   [name], PA-C  loratadine (CLARITIN) 10 MG tablet Take 1 tablet (10 mg total) by mouth daily. 04/09/16   Anselm PancoastShawn C Joy, PA-C    Family History No family history on file.  Social History Social History  Substance Use Topics  . Smoking status: Former Games developermoker  . Smokeless tobacco: Never Used  . Alcohol use No     Allergies   Latex   Review of Systems Review of Systems  Musculoskeletal: Positive for back pain and neck pain.  Neurological: Negative for weakness and numbness.   Physical Exam Updated Vital Signs BP 125/84   Pulse 90   Temp 98.3 F (36.8 C) (Oral)   Resp 20   Ht 4\' 9"  (1.448 m)   Wt 214 lb (97.1 kg)   LMP 08/22/2016   SpO2 99%   BMI 46.31 kg/m   Physical Exam  Constitutional: She is oriented to person, place, and time. Vital signs are normal. She appears well-developed and well-nourished.  HENT:  Head: Normocephalic and atraumatic.  Right Ear: Hearing normal.  Left Ear: Hearing normal.  Eyes: Conjunctivae and EOM are normal. Pupils are equal, round,  and reactive to light.  Neck: Normal range of motion. Neck supple.  Cardiovascular: Normal rate and regular rhythm.   Pulmonary/Chest: Effort normal.  Musculoskeletal: She exhibits tenderness.  No midline cervical, thoracic, or lumbar tenderness. Full ROM of neck. Able to ambulate. Tenderness to palpation to left lumbar musculature as well as right trapezius musculature. NVI x 4 extremities.   Neurological: She is alert and oriented to person, place, and time.  Skin: Skin is warm and dry.  Psychiatric: She has a normal mood and affect. Her speech is normal and behavior is normal. Thought content normal.  Nursing note and vitals reviewed.  ED  Treatments / Results  DIAGNOSTIC STUDIES: OxygeRAn Saturation is 99% on RA, normal by my interpretation.    COORDINATION OF CARE: 11:26 AM Discussed treatment plan with pt at bedside and pt agreed to plan.    Labs (all labs ordered are listed, but only abnormal results are displayed) Labs Reviewed - No data to display  EKG  EKG Interpretation None       Radiology No results found.  Procedures Procedures (including critical care time)  Medications Ordered in ED Medications - No data to display   Initial Impression / Assessment and Plan / ED Course  I have reviewed the triage vital signs and the nursing notes.  Pertinent labs & imaging results that were available during my care of the patient were reviewed by me and considered in my medical decision making (see chart for details).  Clinical Course    Final Clinical Impressions(s) / ED Diagnoses  I have reviewed the relevant previous healthcare records. I obtained HPI from historian.   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 No neurological deficits appreciated. Patient is ambulatory. No warning symptoms of back pain including: fecal incontinence, urinary retention or overflow incontinence, night sweats, waking from sleep with back pain, unexplained fevers or weight loss, h/o cancer, IVDU, recent trauma. No concern for cauda equina, epidural abscess, or other serious cause of back pain. Likely musculoskeletal in origin. Strain of muscle. Conservative measures such as rest, ice/heat and pain medicine indicated with PCP follow-up if no improvement with conservative management.  Given Rx muscle relaxants    Disposition/Plan:  DC Home Additional Verbal discharge instructions given and discussed with patient.  Pt Instructed to f/u with PCP in the next week for evaluation and treatment of symptoms. Return precautions given Pt acknowledges and agrees with  plan  Supervising Physician [name], MD   Final diagnoses:  Acute left-sided low back pain without sciatica  Muscle strain    New Prescriptions New Prescriptions   No medications on file   I personally performed the services described in this documentation, which was scribed in my presence. The recorded information has been reviewed and is accurate.    Deborah Pili, PA-C 08/26/16 1229    [name], MD 08/29/16 1146

## 2016-10-01 DIAGNOSIS — F4310 Post-traumatic stress disorder, unspecified: Secondary | ICD-10-CM | POA: Diagnosis not present

## 2016-12-01 ENCOUNTER — Encounter (HOSPITAL_COMMUNITY): Payer: Self-pay | Admitting: Emergency Medicine

## 2016-12-01 ENCOUNTER — Emergency Department (HOSPITAL_COMMUNITY): Payer: BLUE CROSS/BLUE SHIELD

## 2016-12-01 DIAGNOSIS — Z87891 Personal history of nicotine dependence: Secondary | ICD-10-CM | POA: Insufficient documentation

## 2016-12-01 DIAGNOSIS — Z5321 Procedure and treatment not carried out due to patient leaving prior to being seen by health care provider: Secondary | ICD-10-CM | POA: Insufficient documentation

## 2016-12-01 DIAGNOSIS — I10 Essential (primary) hypertension: Secondary | ICD-10-CM | POA: Insufficient documentation

## 2016-12-01 DIAGNOSIS — J029 Acute pharyngitis, unspecified: Secondary | ICD-10-CM | POA: Insufficient documentation

## 2016-12-01 LAB — COMPREHENSIVE METABOLIC PANEL
ALT: 15 U/L (ref 14–54)
AST: 15 U/L (ref 15–41)
Albumin: 4.2 g/dL (ref 3.5–5.0)
Alkaline Phosphatase: 85 U/L (ref 38–126)
Anion gap: 9 (ref 5–15)
BUN: 10 mg/dL (ref 6–20)
CO2: 23 mmol/L (ref 22–32)
Calcium: 8.9 mg/dL (ref 8.9–10.3)
Chloride: 104 mmol/L (ref 101–111)
Creatinine, Ser: 0.57 mg/dL (ref 0.44–1.00)
GFR calc Af Amer: >60 mL/min (ref >60)
GFR calc non Af Amer: >60 mL/min (ref >60)
Glucose, Bld: 93 mg/dL (ref 65–99)
Potassium: 3.6 mmol/L (ref 3.5–5.1)
Sodium: 136 mmol/L (ref 135–145)
Total Bilirubin: 0.5 mg/dL (ref 0.3–1.2)
Total Protein: 8.1 g/dL (ref 6.5–8.1)

## 2016-12-01 LAB — CBC
HCT: 35.1 % — ABNORMAL LOW (ref 36.0–46.0)
Hemoglobin: 11.6 g/dL — ABNORMAL LOW (ref 12.0–15.0)
MCH: 27.2 pg (ref 26.0–34.0)
MCHC: 33 g/dL (ref 30.0–36.0)
MCV: 82.2 fL (ref 78.0–100.0)
Platelets: 317 10*3/uL (ref 150–400)
RBC: 4.27 MIL/uL (ref 3.87–5.11)
RDW: 14.9 % (ref 11.5–15.5)
WBC: 6.7 10*3/uL (ref 4.0–10.5)

## 2016-12-01 LAB — I-STAT TROPONIN, ED: Troponin i, poc: 0 ng/mL (ref 0.00–0.08)

## 2016-12-01 LAB — LIPASE, BLOOD: Lipase: 16 U/L (ref 11–51)

## 2016-12-01 NOTE — ED Notes (Signed)
Pt adds chest pain intermittently.

## 2016-12-01 NOTE — ED Triage Notes (Signed)
Pt c/o swollen/sore throat and lymph nodes and headache. Pt reports 2 episodes of emesis today.

## 2016-12-02 ENCOUNTER — Emergency Department (HOSPITAL_COMMUNITY)
Admission: EM | Admit: 2016-12-02 | Discharge: 2016-12-02 | Disposition: A | Discharge status: Home / Self Care | Payer: BLUE CROSS/BLUE SHIELD | Attending: Emergency Medicine | Admitting: Emergency Medicine

## 2016-12-02 NOTE — ED Notes (Signed)
No response from lobby when called.

## 2016-12-02 NOTE — ED Notes (Signed)
Called pt  No response noted from lobby  

## 2017-01-16 ENCOUNTER — Emergency Department (HOSPITAL_COMMUNITY): Payer: Self-pay

## 2017-01-16 ENCOUNTER — Encounter (HOSPITAL_COMMUNITY): Payer: Self-pay | Admitting: Emergency Medicine

## 2017-01-16 ENCOUNTER — Emergency Department (HOSPITAL_COMMUNITY)
Admission: EM | Admit: 2017-01-16 | Discharge: 2017-01-16 | Disposition: A | Discharge status: Home / Self Care | Payer: Self-pay | Attending: Emergency Medicine | Admitting: Emergency Medicine

## 2017-01-16 DIAGNOSIS — Z87891 Personal history of nicotine dependence: Secondary | ICD-10-CM | POA: Insufficient documentation

## 2017-01-16 DIAGNOSIS — J069 Acute upper respiratory infection, unspecified: Secondary | ICD-10-CM | POA: Diagnosis not present

## 2017-01-16 DIAGNOSIS — B9789 Other viral agents as the cause of diseases classified elsewhere: Secondary | ICD-10-CM

## 2017-01-16 DIAGNOSIS — I10 Essential (primary) hypertension: Secondary | ICD-10-CM | POA: Insufficient documentation

## 2017-01-16 DIAGNOSIS — R05 Cough: Secondary | ICD-10-CM | POA: Diagnosis not present

## 2017-01-16 DIAGNOSIS — Z79899 Other long term (current) drug therapy: Secondary | ICD-10-CM | POA: Insufficient documentation

## 2017-01-16 LAB — RAPID STREP SCREEN (MED CTR MEBANE ONLY): Streptococcus, Group A Screen (Direct): NEGATIVE

## 2017-01-16 MED ORDER — FLUTICASONE PROPIONATE 50 MCG/ACT NA SUSP: 2.0000 | Freq: Every day | NASAL | 12 refills | Status: DC

## 2017-01-16 MED ORDER — ACETAMINOPHEN 500 MG PO TABS
500.0000 mg | ORAL_TABLET | Freq: Once | ORAL | Status: AC
  Administered 2017-01-16: 500 mg via ORAL
  Filled 2017-01-16: qty 1

## 2017-01-16 MED ORDER — BENZONATATE 100 MG PO CAPS
100.0000 mg | ORAL_CAPSULE | Freq: Once | ORAL | Status: AC
  Administered 2017-01-16: 100 mg via ORAL
  Filled 2017-01-16: qty 1

## 2017-01-16 MED ORDER — IPRATROPIUM-ALBUTEROL 0.5-2.5 (3) MG/3ML IN SOLN
3.0000 mL | Freq: Once | RESPIRATORY_TRACT | Status: AC
  Administered 2017-01-16: 3 mL via RESPIRATORY_TRACT
  Filled 2017-01-16: qty 3

## 2017-01-16 MED ORDER — HYDROCODONE-HOMATROPINE 5-1.5 MG/5ML PO SYRP: 5.0000 mL | ORAL_SOLUTION | Freq: Two times a day (BID) | ORAL | 0 refills | Status: DC | PRN

## 2017-01-16 MED ORDER — ALBUTEROL SULFATE HFA 108 (90 BASE) MCG/ACT IN AERS: 1.0000 | INHALATION_SPRAY | Freq: Four times a day (QID) | RESPIRATORY_TRACT | 0 refills | Status: DC | PRN

## 2017-01-16 NOTE — ED Notes (Signed)
Patient transported to X-ray 

## 2017-01-16 NOTE — ED Provider Notes (Signed)
[facility] Provider Note   CSN: [id number] Arrival date & time: 01/16/17  [id number]  By signing my name below, I, Marnette Burgessyan Andrew Long, attest that this documentation has been prepared under the direction and in the presence of Nationwide Mutual InsuranceKenneth Tyler Alicja Everitt, PA-C. Electronically Signed: Marnette Burgessyan Andrew Long, Scribe. 01/16/2017. 10:26 AM.   History   Chief Complaint Chief Complaint  Patient presents with  . flu sx  . Cough  . Sore Throat  . Nasal Congestion   The history is provided by the patient. No language interpreter was used.    HPI Comments:  Deborah Jacobson is a morbidly obese 36 y.o. female with a PMHx of HTN and Acid Reflux and h/o neck and back pain, who presents to the Emergency Department complaining of persistent, productive cough with green sputum onset last night. She states she has had a constant sore throat for a month, but yesterday the cough and rhinorrhea arose, both with green sputum and mucus. Pt has associated symptoms of sore throat, nasal congestion, lightheadedness, chills,  chest tenderness secondary to cough, and rhinorrhea. She reports sick contact with similar symptoms with her daughter. She tried ibuprofen and herbal tea at home with minimal relief of her symptoms. No h/o COPD or asthma. Pt denies fever, otalgia, and any other complaints at this time. Pt is a former smoker.    Past Medical History:  Diagnosis Date  . Acid reflux   . Diabetes in pregnancy   . Hypertension   . Seasonal allergies     Patient Active Problem List   Diagnosis Date Noted  . Plantar fasciitis of left foot 03/01/2014  . Porokeratosis 03/01/2014  . Pain in lower limb 03/01/2014    Past Surgical History:  Procedure Laterality Date  . CESAREAN SECTION      OB History    No data available       Home Medications    Prior to Admission medications   Medication Sig Start Date End Date Taking? Authorizing Provider  azithromycin (ZITHROMAX Z-PAK) 250 MG tablet Take 1 tablet (250 mg  total) by mouth daily. 500mg  PO day 1, then 250mg  PO days 205 03/04/15   [name], PA-C  benzonatate (TESSALON) 100 MG capsule Take 1 capsule (100 mg total) by mouth every 8 (eight) hours. 04/09/16   [name], PA-C  cephALEXin (KEFLEX) 500 MG capsule Take 1 capsule (500 mg total) by mouth 4 (four) times daily. 04/30/16   [name], PA-C  dextromethorphan-guaiFENesin (MUCINEX DM) 30-600 MG per 12 hr tablet Take 1 tablet by mouth 2 (two) times daily. 03/04/15   [name], PA-C  ibuprofen (ADVIL,MOTRIN) 800 MG tablet Take 1 tablet (800 mg total) by mouth 3 (three) times daily. 04/30/16   [name], PA-C  loratadine (CLARITIN) 10 MG tablet Take 1 tablet (10 mg total) by mouth daily. 04/09/16   [name], PA-C  methocarbamol (ROBAXIN) 500 MG tablet Take 1 tablet (500 mg total) by mouth 2 (two) times daily. 08/26/16   Audry Piliyler Mohr, PA-C    Family History No family history on file.  Social History Social History  Substance Use Topics  . Smoking status: Former Games developermoker  . Smokeless tobacco: Never Used  . Alcohol use No     Allergies   Latex   Review of Systems Review of Systems  Constitutional: Positive for chills and diaphoresis. Negative for fever.  HENT: Positive for congestion, rhinorrhea and sore throat. Negative for ear pain.   Respiratory: Positive for cough.  Positive chest tenderness secondary to cough.   Neurological: Positive for light-headedness.  All other systems reviewed and are negative.    Physical Exam Updated Vital Signs BP 133/97 (BP Location: Left Arm)   Pulse 82   Temp 98.3 F (36.8 C) (Oral)   Resp 21   Ht 4\' 11"  (1.499 m)   Wt 214 lb (97.1 kg)   SpO2 100%   BMI 43.22 kg/m   Physical Exam  Constitutional: She is oriented to person, place, and time. She appears well-developed and well-nourished.  Non toxicappearing. NAD.   HENT:  Head: Normocephalic.  Right Ear: Tympanic membrane, external ear and ear canal normal.    Left Ear: Tympanic membrane, external ear and ear canal normal.  Nose: Mucosal edema and rhinorrhea present.  Mouth/Throat: Uvula is midline and mucous membranes are normal. No trismus in the jaw. Posterior oropharyngeal edema and posterior oropharyngeal erythema present. No oropharyngeal exudate or tonsillar abscesses. Tonsils are 2+ on the right. Tonsils are 2+ on the left. No tonsillar exudate.  Eyes: Conjunctivae are normal.  Neck: Normal range of motion. Neck supple.  No nuchal rigidity.   Cardiovascular: Normal rate, regular rhythm and normal heart sounds.   Pulmonary/Chest: Effort normal. No respiratory distress. She has wheezes. She exhibits tenderness.  Scattered, faint, expiratory wheezes without any rhonchi noted. Chestwall TTP.   Abdominal: She exhibits no distension.  Musculoskeletal: Normal range of motion.  Lymphadenopathy:    She has no cervical adenopathy.  Neurological: She is alert and oriented to person, place, and time.  Skin: Skin is warm and dry.  Psychiatric: She has a normal mood and affect.  Nursing note and vitals reviewed.    ED Treatments / Results  DIAGNOSTIC STUDIES:  Oxygen Saturation is 100% on RA, normal by my interpretation.    COORDINATION OF CARE:  10:26 AM Discussed treatment plan with pt at bedside including rapid antigen test, breathing Tx, CXR, Tylenol, and cough medication and pt agreed to plan.  Labs (all labs ordered are listed, but only abnormal results are displayed) Labs Reviewed  RAPID STREP SCREEN (NOT AT [facility])  CULTURE, GROUP A STREP [facility])    EKG  EKG Interpretation None       Radiology Dg Chest 2 View  Result Date: 01/16/2017 CLINICAL DATA:  Productive cough EXAM: CHEST  2 VIEW COMPARISON:  12/01/2016 FINDINGS: Heart and mediastinal contours are within normal limits. No focal opacities or effusions. No acute bony abnormality. IMPRESSION: No active cardiopulmonary disease. Electronically Signed   By: Charlett Nose M.D.    On: 01/16/2017 10:50    Procedures Procedures (including critical care time)  Medications Ordered in ED Medications  ipratropium-albuterol (DUONEB) 0.5-2.5 (3) MG/3ML nebulizer solution 3 mL (3 mLs Nebulization Given 01/16/17 1057)  acetaminophen (TYLENOL) tablet 500 mg (500 mg Oral Given 01/16/17 1057)  benzonatate (TESSALON) capsule 100 mg (100 mg Oral Given 01/16/17 1057)     Initial Impression / Assessment and Plan / ED Course  I have reviewed the triage vital signs and the nursing notes.  Pertinent labs & imaging results that were available during my care of the patient were reviewed by me and considered in my medical decision making (see chart for details).     Patient presents to the ED with flulike symptoms including body aches, nasal congestion, sore throat, cough. Likely viral illness that does not require antibiotics at this time. She is afebrile however took ibuprofen prior to arrival. Reports sick contacts. Strep test negative. Clinical presentation  not consistent with pneumonia or PE. Chest x-ray shows no focal abnormalities. Patient had faint exudate or wheezes noted. Patient given DuoNeb with improvement and lung sounds. Patient feels much improved after treatment. Vital signs are normal in the ED. She is not Hypoxic. We'll discharge with cough medicine, Flonase, albuterol inhaler. Pt will be discharged with symptomatic treatment.  Discussed return precautions.  Pt is hemodynamically stable & in NAD prior to discharge.  Final Clinical Impressions(s) / ED Diagnoses   Final diagnoses:  Viral URI with cough    New Prescriptions New Prescriptions   ALBUTEROL (PROVENTIL HFA;VENTOLIN HFA) 108 (90 BASE) MCG/ACT INHALER    Inhale 1-2 puffs into the lungs every 6 (six) hours as needed for wheezing or shortness of breath.   FLUTICASONE (FLONASE) 50 MCG/ACT NASAL SPRAY    Place 2 sprays into both nostrils daily.   HYDROCODONE-HOMATROPINE (HYCODAN) 5-1.5 MG/5ML SYRUP    Take 5 mLs by  mouth every 12 (twelve) hours as needed for cough.  I personally performed the services described in this documentation, which was scribed in my presence. The recorded information has been reviewed and is accurate.     Rise Mu, PA-C 01/16/17 1133    Melene Plan, DO 01/16/17 1537

## 2017-01-16 NOTE — Discharge Instructions (Signed)
Her chest x-ray showed no signs of pneumonia. Her strep test is negative. This is likely a viral illness that does not require antibiotics. Have given you a cough syrup to take as needed. This medication make you drowsy so do not drive with it. May use over-the-counter cough suppressants and nasal decongestants including Mucinex. Please use the Flonase for nasal congestion. Using albuterol for any wheezing. Make sure he is drinking plenty of fluids. Motrin and Tylenol for pain and fever. Follow-up with her primary care doctor in 2-3 days if symptoms are not improving. Return to the ED if your symptoms worsen.

## 2017-01-16 NOTE — ED Triage Notes (Signed)
Pt has had flu sx started yesterday, but has had a sore throat for a couple weeks, now has nasal congestion, cough, hurts to cough, productive for green sputum, has been exposed to others with same sx.

## 2017-01-18 LAB — CULTURE, GROUP A STREP (THRC)

## 2017-02-10 ENCOUNTER — Emergency Department (HOSPITAL_COMMUNITY)
Admission: EM | Admit: 2017-02-10 | Discharge: 2017-02-10 | Disposition: A | Discharge status: Home / Self Care | Payer: BLUE CROSS/BLUE SHIELD | Attending: Emergency Medicine | Admitting: Emergency Medicine

## 2017-02-10 ENCOUNTER — Encounter (HOSPITAL_COMMUNITY): Payer: Self-pay | Admitting: Emergency Medicine

## 2017-02-10 DIAGNOSIS — R739 Hyperglycemia, unspecified: Secondary | ICD-10-CM | POA: Insufficient documentation

## 2017-02-10 DIAGNOSIS — Z79899 Other long term (current) drug therapy: Secondary | ICD-10-CM | POA: Insufficient documentation

## 2017-02-10 DIAGNOSIS — I10 Essential (primary) hypertension: Secondary | ICD-10-CM | POA: Diagnosis not present

## 2017-02-10 DIAGNOSIS — R3129 Other microscopic hematuria: Secondary | ICD-10-CM | POA: Insufficient documentation

## 2017-02-10 DIAGNOSIS — Z91040 Latex allergy status: Secondary | ICD-10-CM | POA: Diagnosis not present

## 2017-02-10 DIAGNOSIS — Z87891 Personal history of nicotine dependence: Secondary | ICD-10-CM | POA: Diagnosis not present

## 2017-02-10 LAB — URINALYSIS, ROUTINE W REFLEX MICROSCOPIC
Bilirubin Urine: NEGATIVE
Glucose, UA: NEGATIVE mg/dL
Ketones, ur: NEGATIVE mg/dL
Leukocytes, UA: NEGATIVE
Nitrite: NEGATIVE
Protein, ur: NEGATIVE mg/dL
Specific Gravity, Urine: 1.017 (ref 1.005–1.030)
pH: 5 (ref 5.0–8.0)

## 2017-02-10 LAB — POC URINE PREG, ED: Preg Test, Ur: NEGATIVE

## 2017-02-10 LAB — CBG MONITORING, ED
Glucose-Capillary: 80 mg/dL (ref 65–99)
Glucose-Capillary: 74 mg/dL (ref 65–99)

## 2017-02-10 NOTE — ED Notes (Signed)
Pt reports she has felt really drained and been having blurry vision and nausea as well as CP X2 weeks.

## 2017-02-10 NOTE — ED Provider Notes (Signed)
[facility]-EM[facility]Y DEPT Provider Note   CSN: [id number] Arrival date & time: 02/10/17  1444  By signing my name below, I, Teofilo Pod, attest that this documentation has been prepared under the direction and in the presence of Graciella Freer, [place]. Electronically Signed: Teofilo Pod, ED Scribe. 02/10/2017. 3:57 PM.    History   Chief Complaint Chief Complaint  Patient presents with  . Hyperglycemia   The history is provided by the patient. No language interpreter was used.   HPI Comments:  Deborah Jacobson is a 36 y.o. female with PMHx of pre-diabetes and HTN who presents to the Emergency Department with hyperglycemia. Patient reports that she was diagnosed as pre-diabetic a year ago and was started on Victoza. She reports since that time she has not had any way to regularly check her blood sugar. Last night, she borrowed a relatives blood glucose monitor and found that her blood sugar reading was 150. Patient was concerned with it being high and decided to come to the ED today. She reports feeling generally fatigued and tired for the last day. She reports associated intermittent lightheadedness, blurry vision, dry mouth, and chills. She also reports a diffuse chest pressure that has been ongoing for 2 weeks. She denies any episodes of extreme diaphoresis where she has experienced any near syncope events. No alleviating factors. She denies any LOC, fever, abdominal pain, vomiting, dysuria, hematuria, weakness. Her LMP was 1 month ago.   Past Medical History:  Diagnosis Date  . Acid reflux   . Diabetes in pregnancy   . Hypertension   . Seasonal allergies     Patient Active Problem List   Diagnosis Date Noted  . Plantar fasciitis of left foot 03/01/2014  . Porokeratosis 03/01/2014  . Pain in lower limb 03/01/2014    Past Surgical History:  Procedure Laterality Date  . CESAREAN SECTION      OB History    No data available       Home Medications    Prior to  Admission medications   Medication Sig Start Date End Date Taking? Authorizing Provider  albuterol (PROVENTIL HFA;VENTOLIN HFA) 108 (90 Base) MCG/ACT inhaler Inhale 1-2 puffs into the lungs every 6 (six) hours as needed for wheezing or shortness of breath. 01/16/17   Rise Mu, PA-C  azithromycin (ZITHROMAX Z-PAK) 250 MG tablet Take 1 tablet (250 mg total) by mouth daily.  PO day 1, then  PO days 205 03/04/15   Everlene Farrier, PA-C  benzonatate (TESSALON) 100 MG capsule Take 1 capsule (100 mg total) by mouth every 8 (eight) hours. 04/09/16   [name], PA-C  cephALEXin (KEFLEX) 500 MG capsule Take 1 capsule (500 mg total) by mouth 4 (four) times daily. 04/30/16   [name], PA-C  dextromethorphan-guaiFENesin (MUCINEX DM) 30-600 MG per 12 hr tablet Take 1 tablet by mouth 2 (two) times daily. 03/04/15   Everlene Farrier, PA-C  fluticasone (FLONASE) 50 MCG/ACT nasal spray Place 2 sprays into both nostrils daily. 01/16/17   Rise Mu, PA-C  HYDROcodone-homatropine (HYCODAN) 5-1.5 MG/5ML syrup Take 5 mLs by mouth every 12 (twelve) hours as needed for cough. 01/16/17   Rise Mu, PA-C  ibuprofen (ADVIL,MOTRIN) 800 MG tablet Take 1 tablet (800 mg total) by mouth 3 (three) times daily. 04/30/16   [name], PA-C  loratadine (CLARITIN) 10 MG tablet Take 1 tablet (10 mg total) by mouth daily. 04/09/16   [name], PA-C  methocarbamol (ROBAXIN) 500 MG tablet Take  1 tablet (500 mg total) by mouth 2 (two) times daily. 08/26/16   Audry Pili, PA-C    Family History History reviewed. No pertinent family history.  Social History Social History  Substance Use Topics  . Smoking status: Former Games developer  . Smokeless tobacco: Never Used  . Alcohol use No     Allergies   Latex   Review of Systems Review of Systems  Constitutional: Positive for chills and fatigue. Negative for fever.  Eyes: Positive for visual disturbance.  Respiratory: Positive for shortness of  breath. Negative for cough.   Cardiovascular: Positive for chest pain.  Gastrointestinal: Negative for abdominal pain, constipation, diarrhea, nausea and vomiting.  Genitourinary: Negative for dysuria and hematuria.  Neurological: Positive for light-headedness. Negative for weakness and headaches.  All other systems reviewed and are negative.    Physical Exam Updated Vital Signs BP (!) 137/102 (BP Location: Left Arm)   Pulse 84   Temp 98.3 F (36.8 C) (Oral)   Resp 16   SpO2 99%   Physical Exam  Constitutional: She is oriented to person, place, and time. She appears well-developed and well-nourished.  HENT:  Head: Normocephalic and atraumatic.  Mouth/Throat: Oropharynx is clear and moist and mucous membranes are normal.  Eyes: Conjunctivae and EOM are normal. Pupils are equal, round, and reactive to light. Right eye exhibits no discharge. Left eye exhibits no discharge. No scleral icterus.  Neck: Full passive range of motion without pain.  Cardiovascular: Normal rate, regular rhythm and intact distal pulses.   Pulmonary/Chest: Effort normal and breath sounds normal.  Abdominal: Soft. Normal appearance and bowel sounds are normal. She exhibits no distension. There is no tenderness. There is no rigidity, no guarding and no CVA tenderness (bilaterally).  Musculoskeletal: Normal range of motion. She exhibits no deformity.  Neurological: She is alert and oriented to person, place, and time.  Moving all extremities spontaneously  Skin: Skin is warm and dry. Capillary refill takes less than 2 seconds.  Psychiatric: She has a normal mood and affect. Her speech is normal and behavior is normal.     ED Treatments / Results  DIAGNOSTIC STUDIES:  Oxygen Saturation is 99% on RA, normal by my interpretation.    COORDINATION OF CARE:  3:46 PM Discussed treatment plan with pt at bedside and pt agreed to plan.   Labs (all labs ordered are listed, but only abnormal results are  displayed) Labs Reviewed  URINALYSIS, ROUTINE W REFLEX MICROSCOPIC  CBG MONITORING, ED  POC URINE PREG, ED    EKG  EKG Interpretation  Date/Time:  Tuesday February 10 2017 16:07:19 EDT Ventricular Rate:  77 PR Interval:    QRS Duration: 81 QT Interval:  393 QTC Calculation: 445 R Axis:   35 Text Interpretation:  Sinus rhythm Low voltage, precordial leads Borderline T abnormalities, anterior leads Confirmed by RAY MD, Duwayne Heck ([id number]) on 02/10/2017 4:27:47 PM Also confirmed by Rosalia Hammers MD, Duwayne Heck [phone]), editor Stout CT, Jola Babinski [phone])  on 02/10/2017 4:39:41 PM       Radiology No results found.  Procedures Procedures (including critical care time)  Medications Ordered in ED Medications - No data to display   Initial Impression / Assessment and Plan / ED Course  I have reviewed the triage vital signs and the nursing notes.  Pertinent labs & imaging results that were available during my care of the patient were reviewed by me and considered in my medical decision making (see chart for details).     36 y.o. F  presents with an episode of hyperglycemia last night. Patient states she has not been diagnosed with diabetes but is taking Victoza. Also with some associated fatigue, lightheadedness, chills, CP, dry mouth symptoms that have been ongoing. Benign physical exam. Symptoms likely consistent with hyperglycemia secondary to uncontrolled DM. Plan to check EKG given history of CP.  Will repeat FSBG to eval blood sugar. Will also check urine preg. UA ordered in triage.   Labs and EKG reviewed. Repeat FSBS 74. EKG with no acute changes, no concern for MI. UA positive for Hgb. Patient with no history of urinary or abdominal complaints. No abdominal or CVA tenderness on physical exam. Patient is not on her menstrual cycle. Likely microscopic hematuria that patient will need outpatient follow-up for.   Reviewed findings with patient. Reassured her that EKG was non-concerning for an acute  event.  Discussed with patient that an isolated episode of hyperglycemia does not need emergent treatment in the ED and that to do so could potentially make her hypoglycemic, which would be harmful. Explained that symptoms are likely related to uncontrolled DM which would require outpatient follow-up with her PCP for potential medication adjustment. Instructed patient to maintain diabetic diet. Also discussed microscopic hematuria finding. Instructed her to follow-up with her PCP in 24-48 hours regarding finding. Provided patient with a list of clinic resources to use if he does not have a PCP. Instructed to call them today to arrange follow-up in the next 24-48 hours. Patient asked if she could have a blood glucose monitor to take home. Explained that she would need to consult her PCP to obtain that.. Return precautions discussed. Patient expresses understanding and agreement to plan.     Final Clinical Impressions(s) / ED Diagnoses   Final diagnoses:  Hyperglycemia  Microscopic hematuria    New Prescriptions Discharge Medication List as of 02/10/2017  4:44 PM    I personally performed the services described in this documentation, which was scribed in my presence. The recorded information has been reviewed and is accurate.     Maxwell Caul, PA-C 02/11/17 1955    Margarita Grizzle, MD 02/11/17 [phone]

## 2017-02-10 NOTE — ED Notes (Signed)
ED Provider at bedside. 

## 2017-02-10 NOTE — Discharge Instructions (Signed)
Follow-up with your primary care doctor in 2 days. Call their office and let them know you were seen in the ED.   There was a small amount of blood seen in your urine today. This could be because you are getting ready to start your period or other inflammation or irritation.  Bring the paperwork from the emergency department so you can follow up about the small amount of blood in your urine. Also discuss getting a blood sugar monitoring set.   If you cannot see your PCP or you cannot arrange follow-up, please use the list below to be seen.   Return to the emergency department for any fevers, chills, chest pain, difficulty breathing, persistent vomiting, loss of consciousness or any other worsening or concerning symptoms.    If you do not have a primary care doctor you see regularly, please you the list below. Please call them to arrange for follow-up.    No Primary Care Doctor Call [facility]  [phone] Other agencies that provide inexpensive medical care    [facility]  [phone]    [facility]  [phone]    [facility]  [phone]    [facility]  [phone]    [facility]  [phone]    [facility]  [phone]

## 2017-02-10 NOTE — ED Triage Notes (Signed)
Pt sts felt like her CBG was high last night and took it was 150

## 2017-03-05 DIAGNOSIS — R7309 Other abnormal glucose: Secondary | ICD-10-CM | POA: Diagnosis not present

## 2017-03-05 DIAGNOSIS — I10 Essential (primary) hypertension: Secondary | ICD-10-CM | POA: Diagnosis not present

## 2017-03-05 DIAGNOSIS — F329 Major depressive disorder, single episode, unspecified: Secondary | ICD-10-CM | POA: Diagnosis not present

## 2017-03-05 DIAGNOSIS — E669 Obesity, unspecified: Secondary | ICD-10-CM | POA: Diagnosis not present

## 2017-03-05 DIAGNOSIS — Z6841 Body Mass Index (BMI) 40.0 and over, adult: Secondary | ICD-10-CM | POA: Diagnosis not present

## 2017-05-06 DIAGNOSIS — R5383 Other fatigue: Secondary | ICD-10-CM | POA: Diagnosis not present

## 2017-05-06 DIAGNOSIS — M549 Dorsalgia, unspecified: Secondary | ICD-10-CM | POA: Diagnosis not present

## 2017-05-06 DIAGNOSIS — Z6841 Body Mass Index (BMI) 40.0 and over, adult: Secondary | ICD-10-CM | POA: Diagnosis not present

## 2017-06-11 ENCOUNTER — Encounter: Payer: Self-pay | Admitting: Neurology

## 2017-06-15 ENCOUNTER — Encounter (INDEPENDENT_AMBULATORY_CARE_PROVIDER_SITE_OTHER): Payer: Self-pay

## 2017-06-15 ENCOUNTER — Ambulatory Visit (INDEPENDENT_AMBULATORY_CARE_PROVIDER_SITE_OTHER): Payer: BLUE CROSS/BLUE SHIELD | Admitting: Neurology

## 2017-06-15 ENCOUNTER — Encounter: Payer: Self-pay | Admitting: Neurology

## 2017-06-15 VITALS — BP 116/69 | HR 87 | Ht 59.0 in | Wt 228.0 lb

## 2017-06-15 DIAGNOSIS — R51 Headache: Secondary | ICD-10-CM

## 2017-06-15 DIAGNOSIS — G4726 Circadian rhythm sleep disorder, shift work type: Secondary | ICD-10-CM | POA: Diagnosis not present

## 2017-06-15 DIAGNOSIS — F5113 Hypersomnia due to other mental disorder: Secondary | ICD-10-CM | POA: Diagnosis not present

## 2017-06-15 DIAGNOSIS — E669 Obesity, unspecified: Secondary | ICD-10-CM | POA: Insufficient documentation

## 2017-06-15 DIAGNOSIS — F5103 Paradoxical insomnia: Secondary | ICD-10-CM | POA: Diagnosis not present

## 2017-06-15 DIAGNOSIS — F0630 Mood disorder due to known physiological condition, unspecified: Secondary | ICD-10-CM

## 2017-06-15 DIAGNOSIS — G479 Sleep disorder, unspecified: Secondary | ICD-10-CM | POA: Insufficient documentation

## 2017-06-15 DIAGNOSIS — R519 Headache, unspecified: Secondary | ICD-10-CM | POA: Insufficient documentation

## 2017-06-15 NOTE — Progress Notes (Signed)
SLEEP MEDICINE CLINIC   Provider:  Melvyn Novas, M D  Primary Care Physician:  Arnette Felts   Referring Provider:  Dr. Allyne Gee, MD  Chief Complaint  Patient presents with  . New Patient (Initial Visit)   Husband - also a sleep patient.   HPI:  Deborah Jacobson is a 36 y.o. female , seen here as in a referral/ revisit  from Dr.Sanders for a sleep evaluation   Chief complaint according to patient : " I don't get sleep- I am up at all times" I had the pleasure of seeing Deborah Jacobson today was husband has been a clinic patient here. She reports that she has trouble to go to sleep and to stay asleep. She feels daytime fatigue, often wakes up with headaches feels that her memory and concentration are poor and that she also has shortness of breath and sometimes chest pain. She experienced weight gain over the last 5 years. Deborah Jacobson have 4 children all born by C-section between 1998 and 2007. She carries a diagnosis of depression, anxiety and hypertension as well as obesity. She was also diagnosed as prediabetic.   Sleep habits are as follows: Deborah Jacobson reports that her night shift ends at 7:30 AM at which time she returns home. It may take her an hour or so to get ready for sleep. She often takes a Tylenol PM or something similar to allow her to get drowsy year. She rarely sleeps before 8 AM but usually between 9 AM and 12 noon. She often is hungry at noon and has to eat before she goes back to bed to sleep another 3 hours. 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 She has more frequent bathroom breaks, her husband noticed that she snores when she is very tired, the patient usually sleeps on her side. Her children have also mentioned that she snores. They have not noted apnea. When the patient arises for her next day of night shift she rarely feels refreshed and restored and is still tired.  She has to go to work  at 11 PM. She actually recalls that she dreams in her daytime sleep. She has had experienced isolated sleep coughing or shortness of breath. She yawns all day.     Sleep medical history and family sleep history: Deborah Jacobson reports that she has tried different weight loss programs, but she has a hard time sticking to it. She lists family conditions as kidney failure hypertension, diabetes, congestive heart failure as well as insomnia. Mother and sister both suffer from insomnia. Her children also tend to have insomnia.   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 She has 4 children, is married, she drinks caffeine 2 beverages a day, she does not drink alcohol and does know longer use tobacco, quit in January 2011. 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  Review of Systems: Out of a complete 14 system review, the patient complains of only the following symptoms, and all other reviewed systems are negative. Yawning, headaches, insomnia, shift work.   Epworth score 2-12 , Fatigue severity score 49  , depression score 3/15    Social History   Social History  . Marital status: Married    Spouse name: N/A  . Number of children: N/A  . Years of education: N/A   Occupational History  .  Not on file.   Social History Main Topics  . Smoking status: Former Games developermoker  . Smokeless tobacco: Never Used  . Alcohol use No  . Drug use: No  . Sexual activity: Not on file   Other Topics Concern  . Not on file   Social History Narrative  . No narrative on file    No family history on file.  Past Medical History:  Diagnosis Date  . Acid reflux   . Diabetes in pregnancy   . Hypertension   . Seasonal allergies     Past Surgical History:  Procedure Laterality Date  . CESAREAN SECTION      Current Outpatient Prescriptions  Medication Sig Dispense Refill  . albuterol (PROVENTIL HFA;VENTOLIN HFA) 108 (90 Base) MCG/ACT inhaler Inhale 1-2 puffs into  the lungs every 6 (six) hours as needed for wheezing or shortness of breath. 1 Inhaler 0  . benzonatate (TESSALON) 100 MG capsule Take 1 capsule (100 mg total) by mouth every 8 (eight) hours. 21 capsule 0  . buPROPion (WELLBUTRIN SR) 150 MG 12 hr tablet Take 150 mg by mouth daily.    . diphenhydramine-acetaminophen (TYLENOL PM) 25-500 MG TABS tablet Take 1 tablet by mouth at bedtime as needed.    . fluticasone (FLONASE) 50 MCG/ACT nasal spray Place 2 sprays into both nostrils daily. 16 g 12  . hydrochlorothiazide (HYDRODIURIL) 50 MG tablet Take 125 mg by mouth daily.    Marland Kitchen. ibuprofen (ADVIL,MOTRIN) 800 MG tablet Take 1 tablet (800 mg total) by mouth 3 (three) times daily. 21 tablet 0   No current facility-administered medications for this visit.     Allergies as of 06/15/2017 - Review Complete 06/15/2017  Allergen Reaction Noted  . Latex Hives 03/07/2013    Vitals: BP 116/69   Pulse 87   Ht 4\' 11"  (1.499 m)   Wt 228 lb (103.4 kg)   BMI 46.05 kg/m  Last Weight:  Wt Readings from Last 1 Encounters:  06/15/17 228 lb (103.4 kg)   ZOX:WRUEBMI:Body mass index is 46.05 kg/m.     Last Height:   Ht Readings from Last 1 Encounters:  06/15/17 4\' 11"  (1.499 m)    Physical exam:  General: The patient is awake, alert and appears not in acute distress. The patient is well groomed. Head: Normocephalic, atraumatic. Neck is supple. Mallampati 2  neck circumference:16.5 . Nasal airflow patent ,  Retrognathia is seen.  Cardiovascular:  Regular rate and rhythm , without  murmurs or carotid bruit, and without distended neck veins. Respiratory: Lungs are clear to auscultation. Skin:  Without evidence of edema, or rash Trunk: BMI is 46. The patient's posture is erect  Neurologic exam : The patient is awake and alert, oriented to place and time.     Attention span & concentration ability appears impaired  Speech is fluent,  without  dysarthria, dysphonia or aphasia.  Mood and affect are  appropriate.  Cranial nerves: Pupils are equal and briskly reactive to light. Funduscopic exam without  evidence of pallor or edema.  Extraocular movements  in vertical and horizontal planes intact and without nystagmus. Visual fields by finger perimetry are intact. Hearing to finger rub intact.  Facial sensation intact to fine touch.Facial motor strength is symmetric and tongue and uvula move midline. Shoulder shrug was symmetrical.   Motor exam:   Normal tone, muscle bulk and symmetric strength in all extremities.  Sensory:  Fine touch, pinprick and vibration were tested in all extremities. Proprioception tested in the  upper extremities was normal. She reports pin and needle dysesthesias in her fingers right and left hand in the morning or sometimes after she fell asleep. Coordination: . Finger-to-nose maneuver  normal without evidence of ataxia, dysmetria or tremor. Gait and station: Patient walks without assistive device  Strength within normal limits.  Stance is wide based ,stable  Turns with  3 Steps. Romberg testing is  negative.  Deep tendon reflexes: in the  upper and lower extremities are symmetric and intact. Babinski maneuver response is downgoing.    Assessment:  After physical and neurologic examination, review of laboratory studies,  Personal review of imaging studies, reports of other /same  Imaging studies, results of polysomnography and / or neurophysiology testing and pre-existing records as far as provided in visit., my assessment is   1) Deborah Jacobson will be evaluated for organic reasons of insomnia. He owns her here is that she had insomnia before she became a night shift worker and that she chose night shift work because she felt she would be awake anyway. Her overall sleep time may not have been reduced since she works night shifts. Her poor sleep efficiency however can contribute to her developing diabetes in the near future, makes hypertension more difficult to control and  negatively affects her weight loss. Patient said sleep less than 6 hours and do not sleep before midnight by 20% more likely to become morbidly obese and diabetic. She has tried Benadryl in various forms such as Tylenol PM to help her to go to sleep and it has helped to some degree but it also reduce her daytime drowsy and groggy and with a dry mouth.  2) Deborah Jacobson does not report excessive daytime sleepiness she does not struggle to not fall asleep in daytime. She is seriously sleep deprived, and she does need a routine and certain rituals to help her go to sleep. Important here is a completely dark room for a sleep mask, ear plugs, and apartment or house as quiet as can be without TV or radio noises in the background and a cool temperature -  the best sleep happens at 68.  3) weight loss will help snoring, but she may have apnea as well/    The patient was advised of the nature of the diagnosed disorder , the treatment options and the  risks for general health and wellness arising from not treating the condition.   I spent more than 45 minutes of face to face time with the patient.  Greater than 50% of time was spent in counseling and coordination of care. We have discussed the diagnosis and differential and I answered the patient's questions.    Plan:  Treatment plan and additional workup :  SPLIT night polysomnography for a shift worker, daytime sleeper.  RV after test.  Sleep hygiene reviewed, insomnia booklet given.    Melvyn Novas, MD 06/15/2017, 11:55 AM  Certified in Neurology by ABPN Certified in Sleep Medicine by [facility] [address], [address] [place], [place] [address]

## 2017-06-24 ENCOUNTER — Other Ambulatory Visit: Payer: Self-pay | Admitting: Neurology

## 2017-06-24 ENCOUNTER — Telehealth: Payer: Self-pay | Admitting: Neurology

## 2017-06-24 DIAGNOSIS — G4733 Obstructive sleep apnea (adult) (pediatric): Secondary | ICD-10-CM

## 2017-06-24 NOTE — Telephone Encounter (Signed)
[employer] denied Split sleep and approved HST.  Can I get an order for HST? °

## 2017-06-24 NOTE — Telephone Encounter (Signed)
Order placed

## 2017-07-15 ENCOUNTER — Ambulatory Visit (INDEPENDENT_AMBULATORY_CARE_PROVIDER_SITE_OTHER): Payer: BLUE CROSS/BLUE SHIELD | Admitting: Neurology

## 2017-07-15 DIAGNOSIS — G4733 Obstructive sleep apnea (adult) (pediatric): Secondary | ICD-10-CM | POA: Diagnosis not present

## 2017-07-15 DIAGNOSIS — G4726 Circadian rhythm sleep disorder, shift work type: Secondary | ICD-10-CM

## 2017-07-15 DIAGNOSIS — F329 Major depressive disorder, single episode, unspecified: Secondary | ICD-10-CM | POA: Diagnosis not present

## 2017-07-15 DIAGNOSIS — Z6841 Body Mass Index (BMI) 40.0 and over, adult: Secondary | ICD-10-CM

## 2017-07-15 DIAGNOSIS — M545 Low back pain: Secondary | ICD-10-CM | POA: Diagnosis not present

## 2017-07-15 DIAGNOSIS — R079 Chest pain, unspecified: Secondary | ICD-10-CM | POA: Diagnosis not present

## 2017-07-19 NOTE — Procedures (Signed)
[facility] [address] [place], [place] [address] NAME: Deborah Jacobson                   DOB: 11/16/1980 MEDICAL RECORD [id number]    DOS: 07/16/2017 REFERRING PHYSICIAN: Dorothyann Pengobyn Sanders, MD STUDY PERFORMED: Home Sleep Study HISTORY:   Chief complaint according to patient: "I don't get sleep- I am up at all times" [name] reports that she has trouble to go to sleep and to stay asleep. She also has shortness of breath and sometimes chest pain. She experienced weight gain over the last 5 years. Mr. and [name] have 4 children all born by C-section between 1998 and 2007. She carries a diagnosis of depression, anxiety and hypertension, morbid obesity and was also diagnosed as pre-diabetic. Her night shift ends at 7:30 AM at which time she returns home. It may take her an hour or so to get ready for sleep. She often takes a Tylenol PM or something similar to allow her to get drowsy. Overall, she rarely gets more than 5 hours daytime sleep after night shift work. She has frequent bathroom breaks, her husband noticed that she snores. She has to go to work at 11 PM.    Epworth score 2-12, Fatigue severity score 49, depression score 3/15, BMI 46.0.  HST RESULTS:  Total Recording:    6 Hours, 8 Minutes; Total Apnea/Hypopnea Index (AHI): 15.1 /hr. Average Oxygen Saturation: 95 %: Lowest Oxygen Saturation: 83%  Time Oxygen Saturation Below 88%: 1.0 min. / 0.1 %  Average Heart Rate:  76 bpm (64 - 94 bpm)  IMPRESSION: Mild sleep apnea (AHI 15.1) with one central apnea.  Moderate snoring without hypoxemia, and without Tachy- Brady arrhythmia.  RECOMMENDATION:  weight loss, CPAP and dental device are all treatment options for this patient. I prefer CPAP treatment and recommend to follow/ participate in medical weight management. I would chose an auto-titration device that adjust to changes in pressure while losing weight.    I certify that I have reviewed the  raw data recording prior to the issuance of this report in accordance with the standards of the American Academy of Sleep Medicine (AASM).  Melvyn Novasarmen Lenn Volker, MD  Medical Director of [facility]: Diplomat, ABPN and ABSM Member of / accredited by the AASM

## 2017-07-19 NOTE — Addendum Note (Signed)
Addended by: Melvyn NovasHMEIER, Henrine Hayter on: 07/19/2017 04:26 PM   Modules accepted: Orders

## 2017-07-20 ENCOUNTER — Other Ambulatory Visit: Payer: Self-pay | Admitting: Neurology

## 2017-07-20 ENCOUNTER — Telehealth: Payer: Self-pay | Admitting: Neurology

## 2017-07-20 NOTE — Telephone Encounter (Signed)
-----   Message from Melvyn Novasarmen Dohmeier, MD sent at 07/19/2017  4:26 PM EDT ----- As described in my recommendations, this patient has multiple options to treat her mild apnea, but weight loss and CPAP therapy should go hand in hand.  I will order an auto-titration machine for her and recommend strongly participating in medical weight management.  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   Cc Dr Allyne GeeSanders, Arnette FeltsJanece Moore.

## 2017-07-20 NOTE — Telephone Encounter (Signed)
Called patient to make her aware of the sleep study results. No answer at this time. LVM for pt to call back.

## 2017-07-20 NOTE — Telephone Encounter (Signed)
Pt returned call.I advised pt that Dr. Vickey Hugerohmeier reviewed their sleep study results and found that pt has mild OSA. Dr. Vickey Hugerohmeier recommends that pt starts a weight management diet and  CPAP with auto titration. I reviewed PAP compliance expectations with the pt. Pt is agreeable to starting a CPAP. I advised pt that an order will be sent to a DME, [facility], and [facility] will call the pt within about one week after they file with the pt's insurance. [facility] will show the pt how to use the machine, fit for masks, and troubleshoot the CPAP if needed. A follow up appt was made for insurance purposes with Darrol Angelarolyn Martin NP on Oct 06 2017 at 10:15 am. Pt verbalized understanding to arrive 15 minutes early and bring their CPAP. A letter with all of this information in it will be mailed to the pt as a reminder. I verified with the pt that the address we have on file is correct. Pt verbalized understanding of results. Pt had no questions at this time but was encouraged to call back if questions arise.

## 2017-07-23 DIAGNOSIS — F338 Other recurrent depressive disorders: Secondary | ICD-10-CM | POA: Diagnosis not present

## 2017-07-23 DIAGNOSIS — F419 Anxiety disorder, unspecified: Secondary | ICD-10-CM | POA: Diagnosis not present

## 2017-07-23 DIAGNOSIS — R41840 Attention and concentration deficit: Secondary | ICD-10-CM | POA: Diagnosis not present

## 2017-07-23 DIAGNOSIS — I10 Essential (primary) hypertension: Secondary | ICD-10-CM | POA: Diagnosis not present

## 2017-07-24 DIAGNOSIS — R41840 Attention and concentration deficit: Secondary | ICD-10-CM | POA: Diagnosis not present

## 2017-08-11 ENCOUNTER — Emergency Department (HOSPITAL_COMMUNITY)
Admission: EM | Admit: 2017-08-11 | Discharge: 2017-08-11 | Disposition: A | Discharge status: Home / Self Care | Payer: BLUE CROSS/BLUE SHIELD | Attending: Emergency Medicine | Admitting: Emergency Medicine

## 2017-08-11 ENCOUNTER — Encounter (HOSPITAL_COMMUNITY): Payer: Self-pay

## 2017-08-11 ENCOUNTER — Emergency Department (HOSPITAL_COMMUNITY): Payer: BLUE CROSS/BLUE SHIELD

## 2017-08-11 DIAGNOSIS — R109 Unspecified abdominal pain: Secondary | ICD-10-CM | POA: Diagnosis not present

## 2017-08-11 DIAGNOSIS — Z87891 Personal history of nicotine dependence: Secondary | ICD-10-CM | POA: Diagnosis not present

## 2017-08-11 DIAGNOSIS — N949 Unspecified condition associated with female genital organs and menstrual cycle: Secondary | ICD-10-CM

## 2017-08-11 DIAGNOSIS — Z79899 Other long term (current) drug therapy: Secondary | ICD-10-CM | POA: Diagnosis not present

## 2017-08-11 DIAGNOSIS — R1031 Right lower quadrant pain: Secondary | ICD-10-CM

## 2017-08-11 DIAGNOSIS — N838 Other noninflammatory disorders of ovary, fallopian tube and broad ligament: Secondary | ICD-10-CM | POA: Diagnosis not present

## 2017-08-11 DIAGNOSIS — I10 Essential (primary) hypertension: Secondary | ICD-10-CM | POA: Diagnosis not present

## 2017-08-11 DIAGNOSIS — N83209 Unspecified ovarian cyst, unspecified side: Secondary | ICD-10-CM | POA: Diagnosis not present

## 2017-08-11 LAB — URINALYSIS, ROUTINE W REFLEX MICROSCOPIC
Bilirubin Urine: NEGATIVE
Glucose, UA: NEGATIVE mg/dL
Ketones, ur: NEGATIVE mg/dL
Leukocytes, UA: NEGATIVE
Nitrite: NEGATIVE
Protein, ur: 100 mg/dL — AB
Specific Gravity, Urine: 1.027 (ref 1.005–1.030)
pH: 8 (ref 5.0–8.0)

## 2017-08-11 LAB — CBC
HCT: 36.5 % (ref 36.0–46.0)
Hemoglobin: 11.6 g/dL — ABNORMAL LOW (ref 12.0–15.0)
MCH: 26.3 pg (ref 26.0–34.0)
MCHC: 31.8 g/dL (ref 30.0–36.0)
MCV: 82.8 fL (ref 78.0–100.0)
Platelets: 301 10*3/uL (ref 150–400)
RBC: 4.41 MIL/uL (ref 3.87–5.11)
RDW: 14.9 % (ref 11.5–15.5)
WBC: 5.6 10*3/uL (ref 4.0–10.5)

## 2017-08-11 LAB — COMPREHENSIVE METABOLIC PANEL
ALT: 21 U/L (ref 14–54)
AST: 19 U/L (ref 15–41)
Albumin: 3.7 g/dL (ref 3.5–5.0)
Alkaline Phosphatase: 88 U/L (ref 38–126)
Anion gap: 8 (ref 5–15)
BUN: 9 mg/dL (ref 6–20)
CO2: 28 mmol/L (ref 22–32)
Calcium: 8.8 mg/dL — ABNORMAL LOW (ref 8.9–10.3)
Chloride: 101 mmol/L (ref 101–111)
Creatinine, Ser: 0.73 mg/dL (ref 0.44–1.00)
GFR calc Af Amer: >60 mL/min (ref >60)
GFR calc non Af Amer: >60 mL/min (ref >60)
Glucose, Bld: 133 mg/dL — ABNORMAL HIGH (ref 65–99)
Potassium: 3.1 mmol/L — ABNORMAL LOW (ref 3.5–5.1)
Sodium: 137 mmol/L (ref 135–145)
Total Bilirubin: 0.2 mg/dL — ABNORMAL LOW (ref 0.3–1.2)
Total Protein: 7.2 g/dL (ref 6.5–8.1)

## 2017-08-11 LAB — I-STAT BETA HCG BLOOD, ED (MC, WL, AP ONLY): I-stat hCG, quantitative: <5 m[IU]/mL (ref <5)

## 2017-08-11 LAB — LIPASE, BLOOD: Lipase: 22 U/L (ref 11–51)

## 2017-08-11 MED ORDER — IOPAMIDOL (ISOVUE-300) INJECTION 61%
INTRAVENOUS | Status: AC
  Administered 2017-08-11: 100 mL
  Filled 2017-08-11: qty 100

## 2017-08-11 MED ORDER — KETOROLAC TROMETHAMINE 30 MG/ML IJ SOLN
15.0000 mg | Freq: Once | INTRAMUSCULAR | Status: AC
  Administered 2017-08-11: 15 mg via INTRAVENOUS
  Filled 2017-08-11: qty 1

## 2017-08-11 MED ORDER — SODIUM CHLORIDE 0.9 % IV BOLUS (SEPSIS)
1000.0000 mL | Freq: Once | INTRAVENOUS | Status: AC
  Administered 2017-08-11: 1000 mL via INTRAVENOUS

## 2017-08-11 MED ORDER — ONDANSETRON 4 MG PO TBDP
4.0000 mg | ORAL_TABLET | Freq: Once | ORAL | Status: AC | PRN
  Administered 2017-08-11: 4 mg via ORAL

## 2017-08-11 MED ORDER — ONDANSETRON 4 MG PO TBDP
ORAL_TABLET | ORAL | Status: AC
  Filled 2017-08-11: qty 1

## 2017-08-11 MED ORDER — ONDANSETRON HCL 4 MG/2ML IJ SOLN
4.0000 mg | Freq: Once | INTRAMUSCULAR | Status: AC
  Administered 2017-08-11: 4 mg via INTRAVENOUS
  Filled 2017-08-11: qty 2

## 2017-08-11 NOTE — Discharge Instructions (Signed)
As discussed, today's evaluation has been generally reassuring. There is important to follow-up with our women's health colleagues for monitoring of your condition.   Return here for concerning changes in your condition.

## 2017-08-11 NOTE — ED Notes (Signed)
Pt ambulated to room from waiting room. Tolerated well. Pt providing urine sample now.

## 2017-08-11 NOTE — ED Provider Notes (Signed)
[facility]Y DEPT Provider Note   CSN: [id number] Arrival date & time: 08/11/17  0941     History   Chief Complaint Chief Complaint  Patient presents with  . Abdominal Pain    HPI Deborah Jacobson is a 36 y.o. female.  HPI  Patient presents with concern of right lower quadrant abdominal pain. Onset may have been more than a few days ago, but now over the past 48 hours or so the patient has had persistent sharp severe pain in the right inguinal crease, right lower abdomen. There is associated nausea, anorexia, and the patient notes that she had diminished bowel movements. This improved after using magnesium citrate last night, but pain has been persistent, unchanged. No new fever, chills. She states that she is generally well, denies history of surgery in the abdomen.   Past Medical History:  Diagnosis Date  . Acid reflux   . Diabetes in pregnancy   . Hypertension   . Seasonal allergies     Patient Active Problem List   Diagnosis Date Noted  . Shifting sleep-work schedule 06/15/2017  . 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 Sleep related headaches 06/15/2017  . Super obese 06/15/2017  . Mood complaints in sleep disorder 06/15/2017  . Plantar fasciitis of left foot 03/01/2014  . Porokeratosis 03/01/2014  . Pain in lower limb 03/01/2014    Past Surgical History:  Procedure Laterality Date  . CESAREAN SECTION      OB History    No data available       Home Medications    Prior to Admission medications   Medication Sig Start Date End Date Taking? Authorizing Provider  albuterol (PROVENTIL HFA;VENTOLIN HFA) 108 (90 Base) MCG/ACT inhaler Inhale 1-2 puffs into the lungs every 6 (six) hours as needed for wheezing or shortness of breath. 01/16/17   Rise Mu, PA-C  benzonatate (TESSALON) 100 MG capsule Take 1 capsule (100 mg total) by mouth every 8 (eight) hours. 04/09/16   Joy, Shawn C, PA-C  buPROPion (WELLBUTRIN SR) 150 MG 12 hr tablet Take 150 mg by mouth  daily.    [provider]  diphenhydramine-acetaminophen (TYLENOL PM) 25-500 MG TABS tablet Take 1 tablet by mouth at bedtime as needed.    [provider]  fluticasone (FLONASE) 50 MCG/ACT nasal spray Place 2 sprays into both nostrils daily. 01/16/17   Rise Mu, PA-C  hydrochlorothiazide (HYDRODIURIL) 50 MG tablet Take 125 mg by mouth daily.    [provider]  ibuprofen (ADVIL,MOTRIN) 800 MG tablet Take 1 tablet (800 mg total) by mouth 3 (three) times daily. 04/30/16   Jaynie Crumble, PA-C    Family History No family history on file.  Social History Social History  Substance Use Topics  . Smoking status: Former Games developer  . Smokeless tobacco: Never Used  . Alcohol use No     Allergies   Latex   Review of Systems Review of Systems  Constitutional:       Per HPI, otherwise negative  HENT:       Per HPI, otherwise negative  Respiratory:       Per HPI, otherwise negative  Cardiovascular:       Per HPI, otherwise negative  Gastrointestinal: Positive for abdominal pain and nausea. Negative for vomiting.  Endocrine:       Negative aside from HPI  Genitourinary:       Neg aside from HPI   Musculoskeletal:       Per HPI, otherwise negative  Skin:  Negative.   Neurological: Negative for syncope.     Physical Exam Updated Vital Signs BP 124/90   Pulse 71   Temp 97.8 F (36.6 C) (Oral)   Resp 18   LMP 07/28/2017 (Approximate)   SpO2 100%   Physical Exam  Constitutional: She is oriented to person, place, and time. She appears well-developed and well-nourished. No distress.  Obese young female sitting upright speaking clearly.  HENT:  Head: Normocephalic and atraumatic.  Eyes: Conjunctivae and EOM are normal.  Cardiovascular: Normal rate and regular rhythm.   Pulmonary/Chest: Effort normal and breath sounds normal. No stridor. No respiratory distress.  Abdominal: She exhibits no distension.  Large abdomen, tender to palpation in  the right lower quadrant  Musculoskeletal: She exhibits no edema.  Neurological: She is alert and oriented to person, place, and time. No cranial nerve deficit.  Skin: Skin is warm and dry.  Psychiatric: She has a normal mood and affect.  Nursing note and vitals reviewed.    ED Treatments / Results  Labs (all labs ordered are listed, but only abnormal results are displayed) Labs Reviewed  COMPREHENSIVE METABOLIC PANEL - Abnormal; Notable for the following:       Result Value   Potassium 3.1 (*)    Glucose, Bld 133 (*)    Calcium 8.8 (*)    Total Bilirubin 0.2 (*)    All other components within normal limits  CBC - Abnormal; Notable for the following:    Hemoglobin 11.6 (*)    All other components within normal limits  LIPASE, BLOOD  URINALYSIS, ROUTINE W REFLEX MICROSCOPIC  I-STAT BETA HCG BLOOD, ED ([facility], [facility], [facility] ONLY)    EKG  EKG Interpretation None       Radiology No results found.  Procedures Procedures (including critical care time)  Medications Ordered in ED Medications  ondansetron (ZOFRAN-ODT) 4 MG disintegrating tablet (not administered)  sodium chloride 0.9 % bolus 1,000 mL (not administered)  ketorolac (TORADOL) 30 MG/ML injection 15 mg (not administered)  ondansetron (ZOFRAN) injection 4 mg (not administered)  ondansetron (ZOFRAN-ODT) disintegrating tablet 4 mg (4 mg Oral Given 08/11/17 1005)     Initial Impression / Assessment and Plan / ED Course  I have reviewed the triage vital signs and the nursing notes.  Pertinent labs & imaging results that were available during my care of the patient were reviewed by me and considered in my medical decision making (see chart for details).     3:24 PM Patient awake and alert.  I discussed all findings with her and her companion. With reassuring labs, CT only notable for demonstration of cyst, likely contributing to her ongoing right lower quadrant pain, patient appropriate for discharge. No evidence for  other acute abdominal processes, no evidence for bacteremia or sepsis. Patient will follow up with [facility]   Final Clinical Impressions(s) / ED Diagnoses   Final diagnoses:  Right lower quadrant abdominal pain  Adnexal cyst     Gerhard Munch, MD 08/11/17 1524

## 2017-08-11 NOTE — ED Triage Notes (Signed)
Pt presents for evaluation of RLQ pain starting yesterday, states thought she was constipated so took mag citrate. States has had normal BM since but still feels bad. States N/V as well and intermittent urinary symptoms.

## 2017-08-13 DIAGNOSIS — G4733 Obstructive sleep apnea (adult) (pediatric): Secondary | ICD-10-CM | POA: Diagnosis not present

## 2017-09-02 DIAGNOSIS — F419 Anxiety disorder, unspecified: Secondary | ICD-10-CM | POA: Diagnosis not present

## 2017-09-02 DIAGNOSIS — I10 Essential (primary) hypertension: Secondary | ICD-10-CM | POA: Diagnosis not present

## 2017-09-02 DIAGNOSIS — Z79899 Other long term (current) drug therapy: Secondary | ICD-10-CM | POA: Diagnosis not present

## 2017-09-02 DIAGNOSIS — R41840 Attention and concentration deficit: Secondary | ICD-10-CM | POA: Diagnosis not present

## 2017-09-02 DIAGNOSIS — F338 Other recurrent depressive disorders: Secondary | ICD-10-CM | POA: Diagnosis not present

## 2017-09-02 DIAGNOSIS — F902 Attention-deficit hyperactivity disorder, combined type: Secondary | ICD-10-CM | POA: Diagnosis not present

## 2017-09-13 DIAGNOSIS — G4733 Obstructive sleep apnea (adult) (pediatric): Secondary | ICD-10-CM | POA: Diagnosis not present

## 2017-09-29 ENCOUNTER — Encounter (HOSPITAL_COMMUNITY): Payer: Self-pay | Admitting: Emergency Medicine

## 2017-09-29 ENCOUNTER — Inpatient Hospital Stay (HOSPITAL_COMMUNITY)
Admission: AD | Admit: 2017-09-29 | Discharge: 2017-09-29 | Disposition: A | Discharge status: Home / Self Care | Payer: BLUE CROSS/BLUE SHIELD | Source: Ambulatory Visit | Attending: Obstetrics & Gynecology | Admitting: Obstetrics & Gynecology

## 2017-09-29 DIAGNOSIS — B3731 Acute candidiasis of vulva and vagina: Secondary | ICD-10-CM

## 2017-09-29 DIAGNOSIS — B373 Candidiasis of vulva and vagina: Secondary | ICD-10-CM | POA: Diagnosis not present

## 2017-09-29 DIAGNOSIS — I10 Essential (primary) hypertension: Secondary | ICD-10-CM | POA: Diagnosis not present

## 2017-09-29 DIAGNOSIS — K219 Gastro-esophageal reflux disease without esophagitis: Secondary | ICD-10-CM | POA: Insufficient documentation

## 2017-09-29 DIAGNOSIS — Z79899 Other long term (current) drug therapy: Secondary | ICD-10-CM | POA: Diagnosis not present

## 2017-09-29 DIAGNOSIS — Z87891 Personal history of nicotine dependence: Secondary | ICD-10-CM | POA: Diagnosis not present

## 2017-09-29 DIAGNOSIS — N898 Other specified noninflammatory disorders of vagina: Secondary | ICD-10-CM | POA: Diagnosis not present

## 2017-09-29 LAB — URINALYSIS, ROUTINE W REFLEX MICROSCOPIC
Bilirubin Urine: NEGATIVE
Glucose, UA: NEGATIVE mg/dL
Ketones, ur: NEGATIVE mg/dL
Leukocytes, UA: NEGATIVE
Nitrite: NEGATIVE
Protein, ur: NEGATIVE mg/dL
Specific Gravity, Urine: 1.024 (ref 1.005–1.030)
pH: 5 (ref 5.0–8.0)

## 2017-09-29 LAB — GC/CHLAMYDIA PROBE AMP (~~LOC~~) NOT AT ARMC
Chlamydia: NEGATIVE
Neisseria Gonorrhea: NEGATIVE

## 2017-09-29 LAB — POCT PREGNANCY, URINE: Preg Test, Ur: NEGATIVE

## 2017-09-29 LAB — WET PREP, GENITAL
Clue Cells Wet Prep HPF POC: NONE SEEN
Sperm: NONE SEEN
Trich, Wet Prep: NONE SEEN
Yeast Wet Prep HPF POC: NONE SEEN

## 2017-09-29 MED ORDER — FLUCONAZOLE 150 MG PO TABS: 150.0000 mg | ORAL_TABLET | Freq: Once | ORAL | 0 refills | Status: AC

## 2017-09-29 NOTE — Discharge Instructions (Signed)
May use gyne-cort for itching if needed   In late 2019, the [facility]. At that time, the MAU (Maternity Admissions Unit), where you are being seen today, will no longer see non-pregnant patients. We strongly encourage you to find a doctor's office before that time, so that you can be seen with any GYN concerns, like vaginal discharge, urinary tract infection, etc.. in a timely manner.   In order to make the office visit more convenient, the Center for [facility] will be offering evening hours from 4pm-7:30pm on Monday. There will be same-day appointments, walk-in appointments and scheduled appointments available during this time. We will be adding more evening hours over the next year before the move.   Center for [facility] @ [facility] [phone]  For urgent needs, [facility] is also available for management of urgent GYN complaints such as vaginal discharge or urinary tract infections.    Primary care follow up  [facility] (will see you even if you do not have sickle cell): [phone] [facility]: [phone] [facility]: [phone]

## 2017-09-29 NOTE — MAU Provider Note (Signed)
History     CSN: [id number]  Arrival date and time: 09/29/17 [id number]   First Provider Initiated Contact with Patient 09/29/17 [phone]      No chief complaint on file.  Deborah Jacobson is a 36 y.o. G6P4 who presents today with vulvar irritation and white discharge. She states that about one month ago she changed soap, and has had a lot of itching since then. She reports that in the last week she has since developed a thick, white discharge.    Vaginal Discharge  The patient's primary symptoms include vaginal discharge. This is a new problem. The current episode started in the past 7 days. The problem occurs constantly. The problem has been unchanged. The patient is experiencing no pain. She is not pregnant. Associated symptoms include nausea. Pertinent negatives include no chills, dysuria, fever, frequency, urgency or vomiting. The vaginal discharge was thick and white. There has been no bleeding. Nothing aggravates the symptoms. She has tried antifungals for the symptoms. The treatment provided no relief. She is sexually active. No, her partner does not have an STD.   Past Medical History:  Diagnosis Date  . Acid reflux   . Diabetes in pregnancy   . Hypertension   . Seasonal allergies     Past Surgical History:  Procedure Laterality Date  . CESAREAN SECTION      No family history on file.  Social History   Tobacco Use  . Smoking status: Former Games developermoker  . Smokeless tobacco: Never Used  Substance Use Topics  . Alcohol use: No  . Drug use: No    Allergies:  Allergies  Allergen Reactions  . Latex Hives    Powder from gloves    Medications Prior to Admission  Medication Sig Dispense Refill Last Dose  . ADDERALL XR 20 MG 24 hr capsule Take 20 mg by mouth daily.  0 08/10/2017 at Unknown time  . albuterol (PROVENTIL HFA;VENTOLIN HFA) 108 (90 Base) MCG/ACT inhaler Inhale 1-2 puffs into the lungs every 6 (six) hours as needed for wheezing or shortness of breath. (Patient not  taking: Reported on 08/11/2017) 1 Inhaler 0 Not Taking at Unknown time  . benzonatate (TESSALON) 100 MG capsule Take 1 capsule (100 mg total) by mouth every 8 (eight) hours. (Patient not taking: Reported on 08/11/2017) 21 capsule 0 Not Taking at Unknown time  . fluticasone (FLONASE) 50 MCG/ACT nasal spray Place 2 sprays into both nostrils daily. (Patient taking differently: Place 2 sprays into both nostrils as needed. ) 16 g 12 unknown at prn  . hydrochlorothiazide (HYDRODIURIL) 12.5 MG tablet Take 12.5 mg by mouth daily.  0 08/10/2017 at Unknown time  . ibuprofen (ADVIL,MOTRIN) 800 MG tablet Take 1 tablet (800 mg total) by mouth 3 (three) times daily. (Patient not taking: Reported on 08/11/2017) 21 tablet 0 Not Taking at Unknown time    Review of Systems  Constitutional: Negative for chills and fever.  Gastrointestinal: Positive for nausea. Negative for vomiting.  Genitourinary: Positive for vaginal discharge. Negative for dysuria, frequency and urgency.   Physical Exam   Blood pressure 118/73, pulse 81, temperature 98 F (36.7 C), temperature source Oral, resp. rate 20, height 4' 9.5" (1.461 m), weight 216 lb (98 kg), SpO2 100 %.  Physical Exam  Nursing note and vitals reviewed. Constitutional: She is oriented to person, place, and time. She appears well-developed and well-nourished. No distress.  HENT:  Head: Normocephalic.  Cardiovascular: Normal rate.  Respiratory: Effort normal.  GI: Soft. There is no tenderness.  There is no rebound.  Neurological: She is alert and oriented to person, place, and time.  Skin: Skin is warm and dry.  Psychiatric: She has a normal mood and affect.   Results for orders placed or performed during the hospital encounter of 09/29/17 (from the past 24 hour(s))  Urinalysis, Routine w reflex microscopic     Status: Abnormal   Collection Time: 09/29/17  6:18 AM  Result Value Ref Range   Color, Urine YELLOW YELLOW   APPearance CLEAR CLEAR   Specific Gravity,  Urine 1.024 1.005 - 1.030   pH 5.0 5.0 - 8.0   Glucose, UA NEGATIVE NEGATIVE mg/dL   Hgb urine dipstick LARGE (A) NEGATIVE   Bilirubin Urine NEGATIVE NEGATIVE   Ketones, ur NEGATIVE NEGATIVE mg/dL   Protein, ur NEGATIVE NEGATIVE mg/dL   Nitrite NEGATIVE NEGATIVE   Leukocytes, UA NEGATIVE NEGATIVE   RBC / HPF TOO NUMEROUS TO COUNT 0 - 5 RBC/hpf   WBC, UA 0-5 0 - 5 WBC/hpf   Bacteria, UA RARE (A) NONE SEEN   Squamous Epithelial / LPF 0-5 (A) NONE SEEN   Mucus PRESENT   Pregnancy, urine POC     Status: None   Collection Time: 09/29/17  6:55 AM  Result Value Ref Range   Preg Test, Ur NEGATIVE NEGATIVE  Wet prep, genital     Status: Abnormal   Collection Time: 09/29/17  7:30 AM  Result Value Ref Range   Yeast Wet Prep HPF POC NONE SEEN NONE SEEN   Trich, Wet Prep NONE SEEN NONE SEEN   Clue Cells Wet Prep HPF POC NONE SEEN NONE SEEN   WBC, Wet Prep HPF POC FEW (A) NONE SEEN   Sperm NONE SEEN     MAU Course  Procedures  MDM   Assessment and Plan   1. Yeast infection involving the vagina and surrounding area    DC home Comfort measures reviewed  RX: diflcuan as directed #2  Return to MAU as needed  Follow-up Information    Department, [facility].   Contact information: [address] [place] [address] [phone]            Thressa ShellerHeather Hogan 09/29/2017, 8:40 AM

## 2017-09-29 NOTE — MAU Note (Signed)
Pt reports vaginal irritation x1 month. States she has some white, cottage cheese, vaginal discharge. Used a OTC cream to try and relieve itching and irritation. States she also has some burning with urination.

## 2017-10-05 NOTE — Progress Notes (Deleted)
[facility]  PATIENT: Marlon Peliffany Bevill DOB: 04/29/1981   REASON FOR VISIT: Obstructive sleep apnea with first compliance HISTORY FROM:    HISTORY OF PRESENT ILLNESS:Chief complaint according to patient : " I don't get sleep- I am up at all times" I had the pleasure of seeing Mrs. Chilton SiGreen today was husband has been a clinic patient here. She reports that she has trouble to go to sleep and to stay asleep. She feels daytime fatigue, often wakes up with headaches feels that her memory and concentration are poor and that she also has shortness of breath and sometimes chest pain. She experienced weight gain over the last 5 years. Mr. and [name] have 4 children all born by C-section between 1998 and 2007. She carries a diagnosis of depression, anxiety and hypertension as well as obesity. She was also diagnosed as prediabetic.   Sleep habits are as follows: Mrs. Chilton SiGreen reports that her night shift ends at 7:30 AM at which time she returns home. It may take her an hour or so to get ready for sleep. She often takes a Tylenol PM or something similar to allow her to get drowsy year. She rarely sleeps before 8 AM but usually between 9 AM and 12 noon. She often is hungry at noon and has to eat before she goes back to bed to sleep another 3 hours. 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 She has more frequent bathroom breaks, her husband noticed that she snores when she is very tired, the patient usually sleeps on her side. Her children have also mentioned that she snores. They have not noted apnea. When the patient arises for her next day of night shift she rarely feels refreshed and restored and is still tired.  She has to go to work at 11 PM. She actually recalls that she dreams in her daytime sleep. She has had experienced isolated sleep coughing or shortness of breath. She yawns all day.     Sleep medical  history and family sleep history: Mrs. Chilton SiGreen reports that she has tried different weight loss programs, but she has a hard time sticking to it. She lists family conditions as kidney failure hypertension, diabetes, congestive heart failure as well as insomnia. Mother and sister both suffer from insomnia. Her children also tend to have insomnia.   REVIEW OF SYSTEMS: Full 14 system review of systems performed and notable only for those listed, all others are neg:  Constitutional: neg  Cardiovascular: neg Ear/Nose/Throat: neg  Skin: neg Eyes: neg Respiratory: neg Gastroitestinal: neg  Hematology/Lymphatic: neg  Endocrine: neg Musculoskeletal:neg Allergy/Immunology: neg Neurological: neg Psychiatric: neg Sleep : neg   ALLERGIES: Allergies  Allergen Reactions  . Latex Hives    Powder from gloves    HOME MEDICATIONS: Outpatient Medications Prior to Visit  Medication Sig Dispense Refill  . ADDERALL XR 20 MG 24 hr capsule Take 20 mg by mouth daily.  0  . albuterol (PROVENTIL HFA;VENTOLIN HFA) 108 (90 Base) MCG/ACT inhaler Inhale 1-2 puffs into the lungs every 6 (six) hours as needed for wheezing or shortness of breath. (Patient not taking: Reported on 08/11/2017) 1 Inhaler 0  . benzonatate (TESSALON) 100 MG capsule Take 1 capsule (100 mg total) by mouth every 8 (eight) hours. (Patient not taking: Reported on 08/11/2017) 21 capsule 0  . fluticasone (FLONASE) 50 MCG/ACT nasal spray Place 2 sprays into both nostrils daily. (Patient taking  differently: Place 2 sprays into both nostrils as needed. ) 16 g 12  . hydrochlorothiazide (HYDRODIURIL) 12.5 MG tablet Take 12.5 mg by mouth daily.  0  . ibuprofen (ADVIL,MOTRIN) 800 MG tablet Take 1 tablet (800 mg total) by mouth 3 (three) times daily. (Patient not taking: Reported on 08/11/2017) 21 tablet 0   No facility-administered medications prior to visit.     PAST MEDICAL HISTORY: Past Medical History:  Diagnosis Date  . Acid reflux   .  Diabetes in pregnancy   . Hypertension   . Seasonal allergies     PAST SURGICAL HISTORY: Past Surgical History:  Procedure Laterality Date  . CESAREAN SECTION      FAMILY HISTORY: No family history on file.  SOCIAL HISTORY: Social History   Socioeconomic History  . Marital status: Married    Spouse name: Not on file  . Number of children: Not on file  . Years of education: Not on file  . Highest education level: Not on file  Social Needs  . Financial resource strain: Not on file  . Food insecurity - worry: Not on file  . Food insecurity - inability: Not on file  . Transportation needs - medical: Not on file  . Transportation needs - non-medical: Not on file  Occupational History  . Not on file  Tobacco Use  . Smoking status: Former Games developer  . Smokeless tobacco: Never Used  Substance and Sexual Activity  . Alcohol use: No  . Drug use: No  . Sexual activity: Yes  Other Topics Concern  . Not on file  Social History Narrative  . Not on file     PHYSICAL EXAM  There were no vitals filed for this visit. There is no height or weight on file to calculate BMI.  Generalized: Well developed, in no acute distress  Head: normocephalic and atraumatic,. Oropharynx benign  Neck: Supple, no carotid bruits  Cardiac: Regular rate rhythm, no murmur  Musculoskeletal: No deformity   Neurological examination   Mentation: Alert oriented to time, place, history taking. Attention span and concentration appropriate. Recent and remote memory intact.  Follows all commands speech and language fluent.   Cranial nerve II-XII: Fundoscopic exam reveals sharp disc margins.Pupils were equal round reactive to light extraocular movements were full, visual field were full on confrontational test. Facial sensation and strength were normal. hearing was intact to finger rubbing bilaterally. Uvula tongue midline. head turning and shoulder shrug were normal and symmetric.Tongue protrusion into cheek  strength was normal. Motor: normal bulk and tone, full strength in the BUE, BLE, fine finger movements normal, no pronator drift. No focal weakness Sensory: normal and symmetric to light touch, pinprick, and  Vibration, proprioception  Coordination: finger-nose-finger, heel-to-shin bilaterally, no dysmetria Reflexes: Brachioradialis 2/2, biceps 2/2, triceps 2/2, patellar 2/2, Achilles 2/2, plantar responses were flexor bilaterally. Gait and Station: Rising up from seated position without assistance, normal stance,  moderate stride, good arm swing, smooth turning, able to perform tiptoe, and heel walking without difficulty. Tandem gait is steady  DIAGNOSTIC DATA (LABS, IMAGING, TESTING) - I reviewed patient records, labs, notes, testing and imaging myself where available.  Lab Results  Component Value Date   WBC 5.6 08/11/2017   HGB 11.6 (L) 08/11/2017   HCT 36.5 08/11/2017   MCV 82.8 08/11/2017   PLT 301 08/11/2017      Component Value Date/Time   NA 137 08/11/2017 0947   K 3.1 (L) 08/11/2017 0947   CL 101 08/11/2017 0947  CO2 28 08/11/2017 0947   GLUCOSE 133 (H) 08/11/2017 0947   BUN 9 08/11/2017 0947   CREATININE 0.73 08/11/2017 0947   CALCIUM 8.8 (L) 08/11/2017 0947   PROT 7.2 08/11/2017 0947   ALBUMIN 3.7 08/11/2017 0947   AST 19 08/11/2017 0947   ALT 21 08/11/2017 0947   ALKPHOS 88 08/11/2017 0947   BILITOT 0.2 (L) 08/11/2017 0947   GFRNONAA >60 08/11/2017 0947   GFRAA >60 08/11/2017 0947   No results found for: CHOL, HDL, LDLCALC, LDLDIRECT, TRIG, CHOLHDL No results found for: GNFA2ZHGBA1C No results found for: VITAMINB12 Lab Results  Component Value Date   TSH 0.617 11/27/2010    ***  ASSESSMENT AND PLAN  36 y.o. year old female  has a past medical history of Acid reflux, Diabetes in pregnancy, Hypertension, and Seasonal allergies. here with ***    Cline CrockNancy Carolyn Martin, GNP, [facility], APRN  [facility] [address], [address] [place], [place]  [address] [phone]

## 2017-10-06 ENCOUNTER — Telehealth: Payer: Self-pay | Admitting: *Deleted

## 2017-10-06 ENCOUNTER — Ambulatory Visit: Payer: Self-pay | Admitting: Nurse Practitioner

## 2017-10-06 NOTE — Telephone Encounter (Signed)
Patient was no show for FU with NP today. 

## 2017-10-07 ENCOUNTER — Other Ambulatory Visit: Payer: Self-pay | Admitting: Nurse Practitioner

## 2017-10-07 ENCOUNTER — Ambulatory Visit
Admission: RE | Admit: 2017-10-07 | Discharge: 2017-10-07 | Disposition: A | Discharge status: Home / Self Care | Payer: BLUE CROSS/BLUE SHIELD | Source: Ambulatory Visit | Attending: Nurse Practitioner | Admitting: Nurse Practitioner

## 2017-10-07 ENCOUNTER — Encounter: Payer: Self-pay | Admitting: Nurse Practitioner

## 2017-10-07 DIAGNOSIS — M545 Low back pain: Secondary | ICD-10-CM | POA: Diagnosis not present

## 2017-10-07 DIAGNOSIS — G4730 Sleep apnea, unspecified: Secondary | ICD-10-CM | POA: Diagnosis not present

## 2017-10-07 DIAGNOSIS — M47814 Spondylosis without myelopathy or radiculopathy, thoracic region: Secondary | ICD-10-CM | POA: Diagnosis not present

## 2017-10-07 DIAGNOSIS — M549 Dorsalgia, unspecified: Secondary | ICD-10-CM

## 2017-10-07 DIAGNOSIS — F902 Attention-deficit hyperactivity disorder, combined type: Secondary | ICD-10-CM | POA: Diagnosis not present

## 2017-10-13 DIAGNOSIS — G4733 Obstructive sleep apnea (adult) (pediatric): Secondary | ICD-10-CM | POA: Diagnosis not present

## 2017-10-16 ENCOUNTER — Emergency Department (HOSPITAL_COMMUNITY)
Admission: EM | Admit: 2017-10-16 | Discharge: 2017-10-17 | Disposition: A | Discharge status: Home / Self Care | Payer: BLUE CROSS/BLUE SHIELD | Attending: Emergency Medicine | Admitting: Emergency Medicine

## 2017-10-16 ENCOUNTER — Encounter (HOSPITAL_COMMUNITY): Payer: Self-pay | Admitting: Emergency Medicine

## 2017-10-16 DIAGNOSIS — G8929 Other chronic pain: Secondary | ICD-10-CM | POA: Diagnosis not present

## 2017-10-16 DIAGNOSIS — M545 Low back pain: Secondary | ICD-10-CM | POA: Diagnosis not present

## 2017-10-16 DIAGNOSIS — M549 Dorsalgia, unspecified: Secondary | ICD-10-CM | POA: Insufficient documentation

## 2017-10-16 DIAGNOSIS — Z79899 Other long term (current) drug therapy: Secondary | ICD-10-CM | POA: Diagnosis not present

## 2017-10-16 DIAGNOSIS — Z87891 Personal history of nicotine dependence: Secondary | ICD-10-CM | POA: Insufficient documentation

## 2017-10-16 DIAGNOSIS — I10 Essential (primary) hypertension: Secondary | ICD-10-CM | POA: Insufficient documentation

## 2017-10-16 MED ORDER — LIDOCAINE 5 % EX PTCH
1.0000 | MEDICATED_PATCH | CUTANEOUS | 0 refills | Status: DC
Start: 2017-10-16 — End: 2018-11-16

## 2017-10-16 MED ORDER — CYCLOBENZAPRINE HCL 5 MG PO TABS: 5.0000 mg | ORAL_TABLET | Freq: Every evening | ORAL | 0 refills | Status: DC | PRN

## 2017-10-16 MED ORDER — PREDNISONE 10 MG PO TABS: 40.0000 mg | ORAL_TABLET | Freq: Every day | ORAL | 0 refills | Status: AC

## 2017-10-16 NOTE — Discharge Instructions (Signed)
Take prednisone as prescribed.  Do not take anti-inflammatories while taking this medicine (Advil, Motrin, ibuprofen, Aleve), as this may cause stomach upset.  You may take Tylenol. Use Lidoderm patches as needed for pain. Take Flexeril at night to help with muscle stiffness and soreness.  Have caution, as this may make you tired and groggy.  Do not drive or operate heavy machinery while taking this medicine. It is important that you follow-up with your primary care doctor for further evaluation and management of your back pain. Return to the emergency room if you develop numbness, tingling, loss of bowel or bladder control, or any new or worsening symptoms.

## 2017-10-16 NOTE — ED Notes (Signed)
Patient is A&Ox4.  No signs of distress noted.  Please see providers complete history and physical exam.  

## 2017-10-16 NOTE — ED Triage Notes (Signed)
Pt reports chronic back pain X1 yr r/t injury at work. She states her BP has been fluctuating which her PCP told her is r/t the back pain. BP here noted to be 140's systolic.

## 2017-10-17 NOTE — ED Provider Notes (Signed)
[facility]ERGENCY [facility] Provider Note   CSN: [id number] Arrival date & time: 10/16/17  2052     History   Chief Complaint Chief Complaint  Patient presents with  . Back Pain  . Hypertension    HPI Deborah Jacobson is a 36 y.o. female presenting for evaluation of back pain and high blood pressure.  Patient states that she has been having back pain for over a month.  She was evaluated by primary care, who sent her to get x-rays.  She does not know the results of those yet.  Pain is from her neck to her low back.  It is constant and sharp.  Worse with movement and palpation.  She has been taking ibuprofen with mild improvement of pain.  She denies fall, trauma, or injury.  She denies numbness or tingling.  She denies loss of bowel or bladder control.  She denies pain radiating anywhere, including down her legs.  She has never seen a spine doctor.  She denies other red flags including fevers, chills, rash, history of IV drug use, or history of cancer.  Patient states that her blood pressure has been fluctuating.  She takes medicine for this.  She states that her primary care was supposed to call in another prescription, but never did.  She denies headache, vision changes, chest pain, shortness of breath, or flank pain.  Patient states she has been very stressed recently, and works as a LawyerCNA, doing lots of lifting.  HPI  Past Medical History:  Diagnosis Date  . Acid reflux   . Diabetes in pregnancy   . Hypertension   . Seasonal allergies     Patient Active Problem List   Diagnosis Date Noted  . Shifting sleep-work schedule 06/15/2017  . 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 Sleep related headaches 06/15/2017  . Super obese 06/15/2017  . Mood complaints in sleep disorder 06/15/2017  . Plantar fasciitis of left foot 03/01/2014  . Porokeratosis 03/01/2014  . Pain in lower limb 03/01/2014    Past Surgical History:  Procedure Laterality Date  . CESAREAN  SECTION      OB History    Gravida Para Term Preterm AB Living   6 4       4    SAB TAB Ectopic Multiple Live Births                   Home Medications    Prior to Admission medications   Medication Sig Start Date End Date Taking? Authorizing Provider  ADDERALL XR 20 MG 24 hr capsule Take 20 mg by mouth daily. 07/27/17   [provider]  albuterol (PROVENTIL HFA;VENTOLIN HFA) 108 (90 Base) MCG/ACT inhaler Inhale 1-2 puffs into the lungs every 6 (six) hours as needed for wheezing or shortness of breath. Patient not taking: Reported on 08/11/2017 01/16/17   Demetrios LollLeaphart, Kenneth T, PA-C  benzonatate (TESSALON) 100 MG capsule Take 1 capsule (100 mg total) by mouth every 8 (eight) hours. Patient not taking: Reported on 08/11/2017 04/09/16   Joy, Ines BloomerShawn C, PA-C  cyclobenzaprine (FLEXERIL) 5 MG tablet Take 1 tablet (5 mg total) by mouth at bedtime as needed for muscle spasms. 10/16/17   Adhrit Krenz, PA-C  fluticasone (FLONASE) 50 MCG/ACT nasal spray Place 2 sprays into both nostrils daily. Patient taking differently: Place 2 sprays into both nostrils as needed.  01/16/17   Rise MuLeaphart, Kenneth T, PA-C  hydrochlorothiazide (HYDRODIURIL) 12.5 MG tablet Take 12.5 mg by mouth daily. 06/24/17  [provider]  ibuprofen (ADVIL,MOTRIN) 800 MG tablet Take 1 tablet (800 mg total) by mouth 3 (three) times daily. Patient not taking: Reported on 08/11/2017 04/30/16   Jaynie Crumble, PA-C  lidocaine (LIDODERM) 5 % Place 1 patch onto the skin daily. Remove & Discard patch within 12 hours or as directed by MD 10/16/17   Jayzen Paver, PA-C  predniSONE (DELTASONE) 10 MG tablet Take 4 tablets (40 mg total) by mouth daily for 5 days. 10/16/17 10/21/17  Jamita Mckelvin, PA-C    Family History No family history on file.  Social History Social History   Tobacco Use  . Smoking status: Former Games developer  . Smokeless tobacco: Never Used  Substance Use Topics  . Alcohol use: No  . Drug use: No       Allergies   Latex   Review of Systems Review of Systems  Musculoskeletal: Positive for back pain.  Neurological: Negative for numbness.     Physical Exam Updated Vital Signs BP (!) 129/91   Pulse 88   Temp 98 F (36.7 C) (Oral)   Resp 18   Ht 4\' 10"  (1.473 m)   Wt 97.5 kg (215 lb)   SpO2 100%   BMI 44.94 kg/m   Physical Exam  Constitutional: She is oriented to person, place, and time. She appears well-developed and well-nourished. No distress.  HENT:  Head: Normocephalic and atraumatic.  Eyes: EOM are normal.  Neck: Normal range of motion.  Cardiovascular: Normal rate, regular rhythm and intact distal pulses.  Pulmonary/Chest: Effort normal and breath sounds normal. No respiratory distress. She has no wheezes. She has no rales.  Abdominal: She exhibits no distension.  Musculoskeletal: She exhibits tenderness.  Tenderness to palpation of bilateral back.  No increased pain over midline spine.  Full active range of motion of upper and lower extremities.  Strength of lower extremities intact.  Radial and pedal pulses equal bilaterally.  Sensation intact bilaterally.  Soft compartments.  Patient is ambulatory.  No pain of the neck or C-spine.  No obvious deformities.  Neurological: She is alert and oriented to person, place, and time. No sensory deficit.  Skin: Skin is warm. No rash noted.  Psychiatric: She has a normal mood and affect.  Nursing note and vitals reviewed.    ED Treatments / Results  Labs (all labs ordered are listed, but only abnormal results are displayed) Labs Reviewed - No data to display  EKG  EKG Interpretation None       Radiology No results found.  Procedures Procedures (including critical care time)  Medications Ordered in ED Medications - No data to display   Initial Impression / Assessment and Plan / ED Course  I have reviewed the triage vital signs and the nursing notes.  Pertinent labs & imaging results that were  available during my care of the patient were reviewed by me and considered in my medical decision making (see chart for details).     Patient presenting with over 1 month history of back pain.  Per chart review, patient has been having this pain for several months.  X-rays performed last week show arthritic changes without acute abnormality.  Physical exam reassuring, patient without any neurologic deficits.  Discussed findings with patient.  Discussed that her pain is likely due to arthritis and muscular pain.  Discussed that her blood pressure in the ER is not concerning, and she is without signs of endorgan damage.  Blood pressure is likely multifactorial, due to pain, stress,  and ibuprofen use.  Patient counseled to decrease ibuprofen use.  Will do steroid burst, Lidoderm, and muscle relaxers.  Patient to follow-up with primary care for further evaluation of back pain.  At this time, patient appears safe for discharge.  Return precautions given.  Patient states she understands and agrees to plan.  Final Clinical Impressions(s) / ED Diagnoses   Final diagnoses:  Chronic bilateral back pain, unspecified back location    ED Discharge Orders        Ordered    predniSONE (DELTASONE) 10 MG tablet  Daily     10/16/17 2347    lidocaine (LIDODERM) 5 %  Every 24 hours     10/16/17 2347    cyclobenzaprine (FLEXERIL) 5 MG tablet  At bedtime PRN     10/16/17 2347       Shaden Lacher, PA-C 10/17/17 0207    Ward, Layla MawKristen N, DO 10/17/17 0210

## 2017-10-17 NOTE — ED Notes (Signed)
PT states understanding of care given, follow up care, and medication prescribed. PT ambulated from ED to car with a steady gait. 

## 2017-10-29 DIAGNOSIS — R319 Hematuria, unspecified: Secondary | ICD-10-CM | POA: Diagnosis not present

## 2017-10-29 DIAGNOSIS — M545 Low back pain: Secondary | ICD-10-CM | POA: Diagnosis not present

## 2017-11-30 DIAGNOSIS — M545 Low back pain, unspecified: Secondary | ICD-10-CM | POA: Insufficient documentation

## 2017-11-30 DIAGNOSIS — G8929 Other chronic pain: Secondary | ICD-10-CM | POA: Insufficient documentation

## 2018-01-24 IMAGING — CT CT ABD-PELV W/ CM
2 of 4 series · 15 of 46 positions shown, 17 images · IV contrast (iopamidol)
Comparison: None.

CLINICAL DATA: Right lower quadrant abdominal pain, nausea and
anorexia.

EXAM:
CT ABDOMEN AND PELVIS WITH CONTRAST
TECHNIQUE: Multidetector CT imaging of the abdomen and pelvis was performed
using the standard protocol following bolus administration of
intravenous contrast.
CONTRAST:  100mL 01A20V-INN IOPAMIDOL (01A20V-INN) INJECTION 61%

[Series 3: a/p w/ 5mm · axial · 0.98mm/px · z∈[+625,+1070]mm · 12 of 99 slices shown, 14 images]
[im 5/99  soft-tissue]
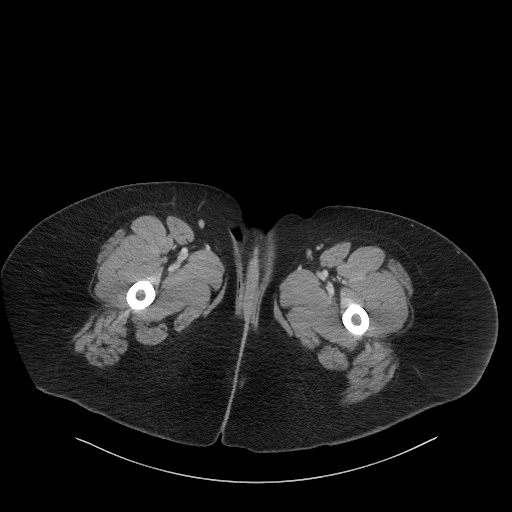
[im 5/99  bone]
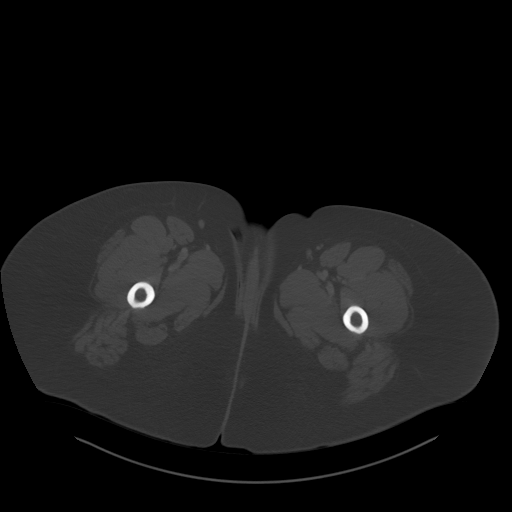
[im 13/99  soft-tissue]
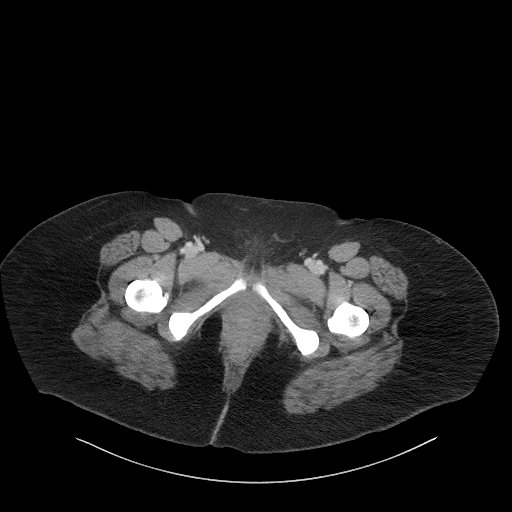
[im 21/99  soft-tissue]
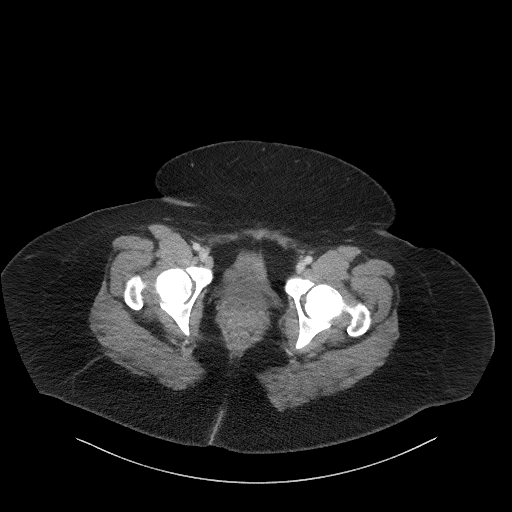
[im 29/99  soft-tissue]
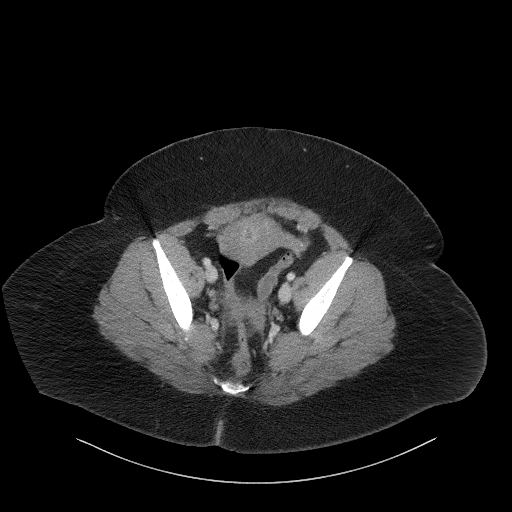
[im 37/99  soft-tissue]
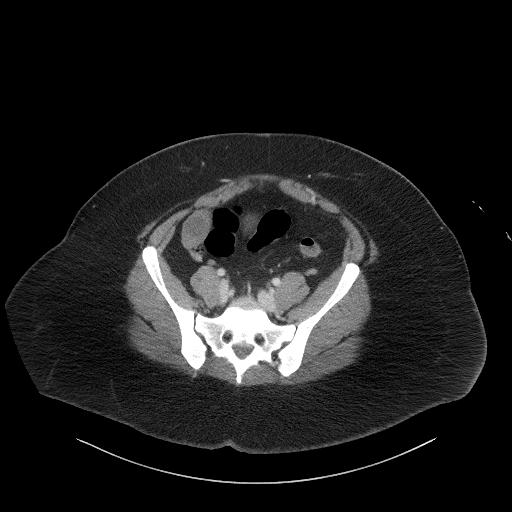
[im 45/99  soft-tissue]
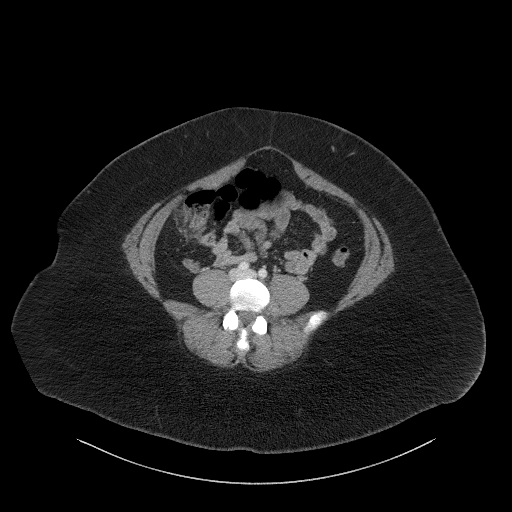
[im 54/99  soft-tissue]
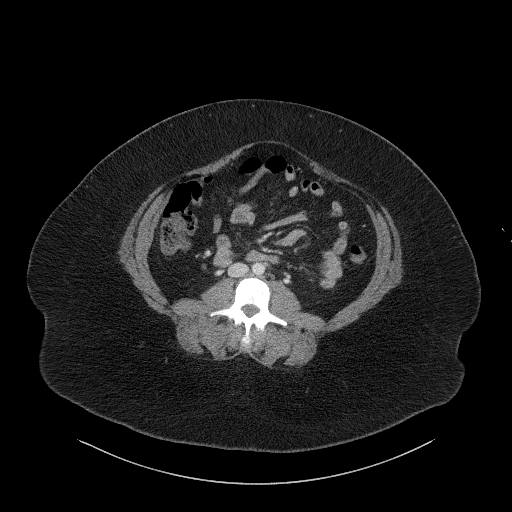
[im 62/99  soft-tissue]
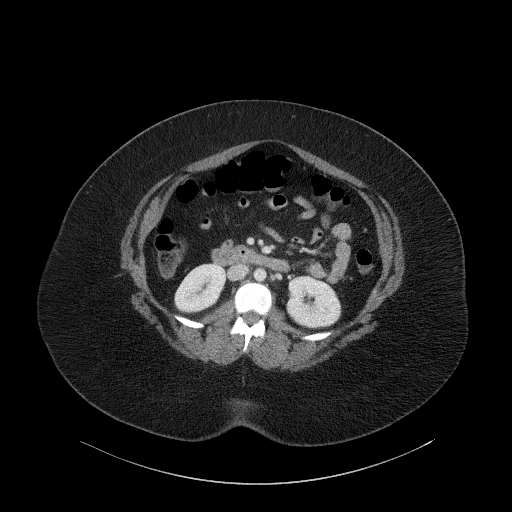
[im 70/99  soft-tissue]
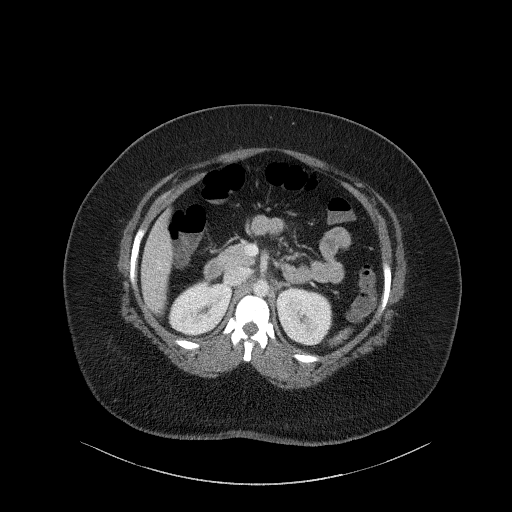
[im 70/99  bone]
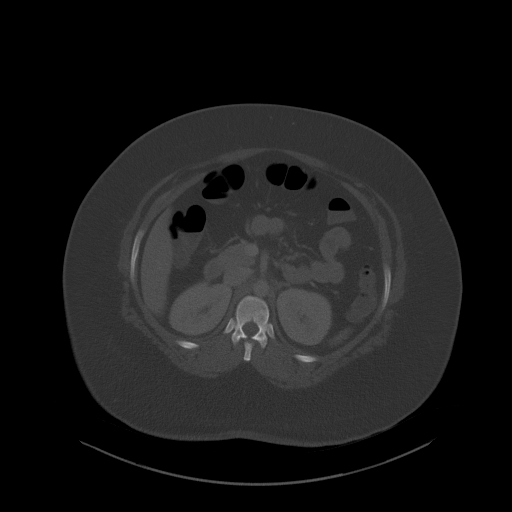
[im 78/99  soft-tissue]
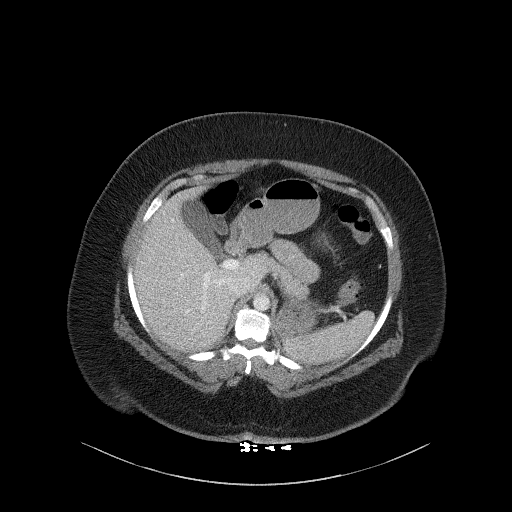
[im 86/99  soft-tissue]
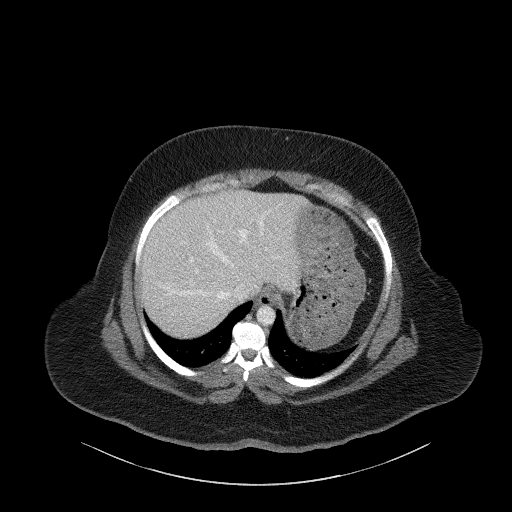
[im 94/99  soft-tissue]
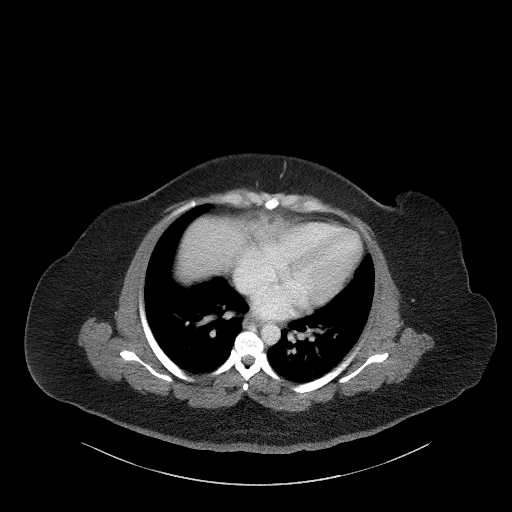

[Series 6: a/p w/ cor · coronal · 0.93mm/px · 3 of 173 slices shown]
[im 58/173  soft-tissue]
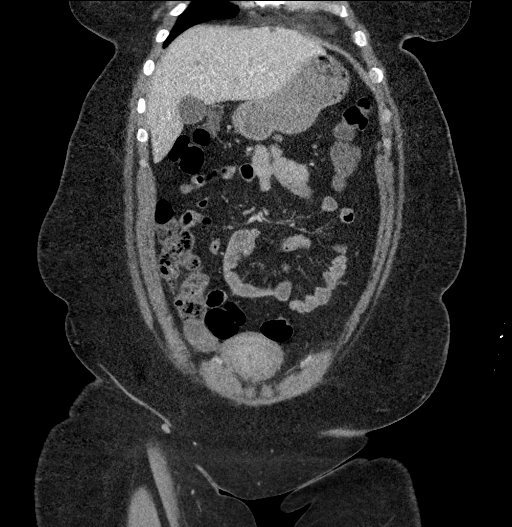
[im 77/173  soft-tissue]
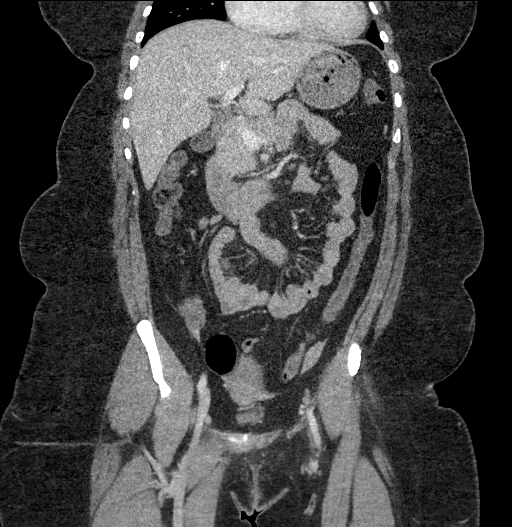
[im 96/173  soft-tissue]
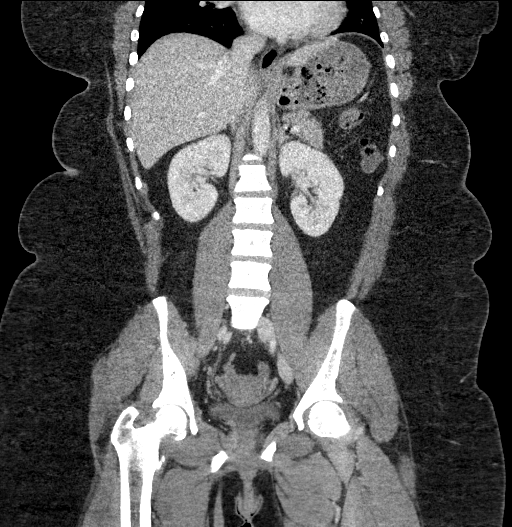

[15 of 46 positions shown; findings below may reference images not displayed]

FINDINGS: Lower chest: Limited visualization of lower thorax demonstrates
minimal dependent subpleural ground-glass atelectasis. No focal
airspace opacities. No pleural effusion.

Borderline cardiomegaly.  No pericardial effusion.

Hepatobiliary: Normal hepatic contour. There are several punctate
(sub 6 mm) hypoattenuating lesions scattered throughout the liver
which are too small to accurately characterize though favored to
represent hepatic cysts. Note is made of a minimal amount of focal
fatty infiltration adjacent to the fissure for ligamentum teres.
Normal appearance of the gallbladder given degree distention. No
radiopaque gallstones. No intra or extrahepatic biliary ductal
dilatation. No ascites.

Pancreas: Normal appearance of the pancreas

Spleen: Normal appearance of the spleen

Adrenals/Urinary Tract: There is symmetric enhancement of the
bilateral kidneys. No definite renal stones this postcontrast
examination. No discrete renal lesions. No urine obstruction or
perinephric stranding.

Normal appearance of the bilateral adrenal glands.

The urinary bladder is underdistended.

Stomach/Bowel: The bowel is normal in course and caliber without
wall thickening or evidence of enteric obstruction.

Normal appearance of the retrocecal appendix (coronal images 80
through 85, series 6). No peri appendiceal stranding.

Normal appearance of the terminal ileum. No pneumoperitoneum,
pneumatosis or portal venous gas.

Vascular/Lymphatic: Normal caliber of the abdominal aorta. The major
branch vessels of the abdominal aorta appear patent on this non CTA
examination.

Scattered mesenteric lymph nodes are numerous though individually
not enlarged by size criteria with index right lower quadrant
mesenteric lymph node measuring 0.8 cm in greatest short axis
diameter (image 43, series 3), presumably reactive due to patient
body habitus. No bulky retroperitoneal, mesenteric, pelvic or
inguinal

Reproductive: Note is made of a approximately 2.3 cm hypoattenuating
(16 Hounsfield unit) presumably physiologic right-sided adnexal cyst
(coronal image 61, series 6; axial image 63, series 3). Otherwise,
normal appearance of the pelvic organs. No discrete left-sided
adnexal lesions. No free fluid in the pelvic cul-de-sac.

Other: Tiny mesenteric fat containing periumbilical hernia. Mild
diastases of the midline of the abdominal rectus musculature.

Musculoskeletal: No acute or aggressive osseous abnormalities.
Stigmata of DISH within the caudal aspect of the thoracic spine.
IMPRESSION: Approximately 2.3 cm right-sided presumably physiologic adnexal
cyst. Otherwise, no explanation for patient's right lower quadrant
abdominal pain.

Specifically, no evidence of diverticulitis, appendicitis, enteric
or urinary obstruction.

Further evaluation with pelvic ultrasound could be performed as
clinically indicated.

## 2018-02-15 ENCOUNTER — Emergency Department (HOSPITAL_COMMUNITY)
Admission: EM | Admit: 2018-02-15 | Discharge: 2018-02-16 | Disposition: A | Discharge status: Home / Self Care | Payer: PRIVATE HEALTH INSURANCE | Attending: Emergency Medicine | Admitting: Emergency Medicine

## 2018-02-15 DIAGNOSIS — Z79899 Other long term (current) drug therapy: Secondary | ICD-10-CM | POA: Insufficient documentation

## 2018-02-15 DIAGNOSIS — R1030 Lower abdominal pain, unspecified: Secondary | ICD-10-CM | POA: Insufficient documentation

## 2018-02-15 DIAGNOSIS — R110 Nausea: Secondary | ICD-10-CM | POA: Insufficient documentation

## 2018-02-15 DIAGNOSIS — Z87891 Personal history of nicotine dependence: Secondary | ICD-10-CM | POA: Insufficient documentation

## 2018-02-15 DIAGNOSIS — R197 Diarrhea, unspecified: Secondary | ICD-10-CM | POA: Insufficient documentation

## 2018-02-15 DIAGNOSIS — I10 Essential (primary) hypertension: Secondary | ICD-10-CM | POA: Insufficient documentation

## 2018-02-16 ENCOUNTER — Emergency Department (HOSPITAL_COMMUNITY): Payer: PRIVATE HEALTH INSURANCE

## 2018-02-16 ENCOUNTER — Encounter (HOSPITAL_COMMUNITY): Payer: Self-pay | Admitting: Emergency Medicine

## 2018-02-16 LAB — COMPREHENSIVE METABOLIC PANEL
ALT: 14 U/L (ref 14–54)
AST: 16 U/L (ref 15–41)
Albumin: 4.3 g/dL (ref 3.5–5.0)
Alkaline Phosphatase: 87 U/L (ref 38–126)
Anion gap: 9 (ref 5–15)
BUN: 7 mg/dL (ref 6–20)
CO2: 26 mmol/L (ref 22–32)
Calcium: 9.5 mg/dL (ref 8.9–10.3)
Chloride: 100 mmol/L — ABNORMAL LOW (ref 101–111)
Creatinine, Ser: 0.83 mg/dL (ref 0.44–1.00)
GFR calc Af Amer: >60 mL/min (ref >60)
GFR calc non Af Amer: >60 mL/min (ref >60)
Glucose, Bld: 106 mg/dL — ABNORMAL HIGH (ref 65–99)
Potassium: 2.8 mmol/L — ABNORMAL LOW (ref 3.5–5.1)
Sodium: 135 mmol/L (ref 135–145)
Total Bilirubin: 0.6 mg/dL (ref 0.3–1.2)
Total Protein: 8.3 g/dL — ABNORMAL HIGH (ref 6.5–8.1)

## 2018-02-16 LAB — WET PREP, GENITAL
Clue Cells Wet Prep HPF POC: NONE SEEN
Sperm: NONE SEEN
Trich, Wet Prep: NONE SEEN
Yeast Wet Prep HPF POC: NONE SEEN

## 2018-02-16 LAB — URINALYSIS, ROUTINE W REFLEX MICROSCOPIC
Bacteria, UA: NONE SEEN
Bilirubin Urine: NEGATIVE
Glucose, UA: NEGATIVE mg/dL
Ketones, ur: 5 mg/dL — AB
Leukocytes, UA: NEGATIVE
Nitrite: NEGATIVE
Protein, ur: NEGATIVE mg/dL
Specific Gravity, Urine: 1.024 (ref 1.005–1.030)
pH: 5 (ref 5.0–8.0)

## 2018-02-16 LAB — LIPASE, BLOOD: Lipase: 24 U/L (ref 11–51)

## 2018-02-16 LAB — CBC
HCT: 38 % (ref 36.0–46.0)
Hemoglobin: 12.3 g/dL (ref 12.0–15.0)
MCH: 27.2 pg (ref 26.0–34.0)
MCHC: 32.4 g/dL (ref 30.0–36.0)
MCV: 83.9 fL (ref 78.0–100.0)
Platelets: 361 10*3/uL (ref 150–400)
RBC: 4.53 MIL/uL (ref 3.87–5.11)
RDW: 15 % (ref 11.5–15.5)
WBC: 7.6 10*3/uL (ref 4.0–10.5)

## 2018-02-16 LAB — I-STAT BETA HCG BLOOD, ED (MC, WL, AP ONLY): I-stat hCG, quantitative: <5 m[IU]/mL (ref <5)

## 2018-02-16 MED ORDER — ONDANSETRON 4 MG PO TBDP: 4.0000 mg | ORAL_TABLET | Freq: Three times a day (TID) | ORAL | 0 refills | Status: DC | PRN

## 2018-02-16 MED ORDER — ONDANSETRON HCL 4 MG/2ML IJ SOLN
4.0000 mg | Freq: Once | INTRAMUSCULAR | Status: AC
  Administered 2018-02-16: 4 mg via INTRAVENOUS
  Filled 2018-02-16: qty 2

## 2018-02-16 MED ORDER — MORPHINE SULFATE (PF) 4 MG/ML IV SOLN
4.0000 mg | Freq: Once | INTRAVENOUS | Status: AC
  Administered 2018-02-16: 4 mg via INTRAVENOUS
  Filled 2018-02-16: qty 1

## 2018-02-16 MED ORDER — POTASSIUM CHLORIDE 10 MEQ/100ML IV SOLN
10.0000 meq | Freq: Once | INTRAVENOUS | Status: AC
  Administered 2018-02-16: 10 meq via INTRAVENOUS
  Filled 2018-02-16: qty 100

## 2018-02-16 MED ORDER — POTASSIUM CHLORIDE CRYS ER 20 MEQ PO TBCR
40.0000 meq | EXTENDED_RELEASE_TABLET | Freq: Once | ORAL | Status: AC
  Administered 2018-02-16: 40 meq via ORAL
  Filled 2018-02-16: qty 2

## 2018-02-16 MED ORDER — IOPAMIDOL (ISOVUE-300) INJECTION 61%
100.0000 mL | Freq: Once | INTRAVENOUS | Status: AC | PRN
  Administered 2018-02-16: 100 mL via INTRAVENOUS

## 2018-02-16 MED ORDER — IOPAMIDOL (ISOVUE-300) INJECTION 61%
INTRAVENOUS | Status: AC
  Filled 2018-02-16: qty 100

## 2018-02-16 NOTE — ED Triage Notes (Signed)
Pt c/o lower abd pain that started this morning.  No emesis but nausea, feels like "labor pains."

## 2018-02-16 NOTE — Discharge Instructions (Signed)
Please read instructions below. Drink clear liquids until your stomach feels better. Then, slowly introduce bland foods into your diet as tolerated, such as bread, rice, apples, bananas. You can take zofran every 8 hours as needed for nausea. You can take tylenol or ibuprofen as needed for pain. Follow up with your primary care if symptoms persist. Return to the ER for severe abdominal pain, fever, uncontrollable vomiting, or new or concerning symptoms.

## 2018-02-16 NOTE — ED Notes (Signed)
Patient verbalizes understanding of discharge instructions. Opportunity for questioning and answers were provided. Armband removed by staff, pt discharged from ED ambulatory.   

## 2018-02-16 NOTE — ED Provider Notes (Signed)
[facility] EMERGENCY DEPARTMENT Provider Note   CSN: [id number] Arrival date & time: 02/15/18  2351     History   Chief Complaint Chief Complaint  Patient presents with  . Abdominal Pain    HPI Deborah Jacobson is a 37 y.o. female with past medical history of hypertension, presenting to the ED with acute onset of right lower quadrant and mid lower abdominal pain that began yesterday morning.  Patient states pain has been crampy and intermittent, however sharp at times.  Patient reports associated nausea and 3 episodes of nonbloody diarrhea.  Denies associated vaginal bleeding or discharge, fever or chills, vomiting, urinary symptoms, or other complaints.  No medications tried prior to arrival.  No history of abdominal surgeries.  LMP began about 1 week ago and ended about 2 days ago.  The history is provided by the patient.    Past Medical History:  Diagnosis Date  . Acid reflux   . Diabetes in pregnancy   . Hypertension   . Seasonal allergies     Patient Active Problem List   Diagnosis Date Noted  . Shifting sleep-work schedule 06/15/2017  . 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 Sleep related headaches 06/15/2017  . Super obese 06/15/2017  . Mood complaints in sleep disorder 06/15/2017  . Plantar fasciitis of left foot 03/01/2014  . Porokeratosis 03/01/2014  . Pain in lower limb 03/01/2014    Past Surgical History:  Procedure Laterality Date  . CESAREAN SECTION       OB History    Gravida  6   Para  4   Term      Preterm      AB      Living  4     SAB      TAB      Ectopic      Multiple      Live Births               Home Medications    Prior to Admission medications   Medication Sig Start Date End Date Taking? Authorizing Provider  ADDERALL XR 20 MG 24 hr capsule Take 20 mg by mouth daily. 07/27/17   [provider]  albuterol (PROVENTIL HFA;VENTOLIN HFA) 108 (90 Base) MCG/ACT inhaler Inhale 1-2 puffs into the  lungs every 6 (six) hours as needed for wheezing or shortness of breath. Patient not taking: Reported on 08/11/2017 01/16/17   Demetrios Loll T, PA-C  benzonatate (TESSALON) 100 MG capsule Take 1 capsule (100 mg total) by mouth every 8 (eight) hours. Patient not taking: Reported on 08/11/2017 04/09/16   Joy, Ines Bloomer C, PA-C  cyclobenzaprine (FLEXERIL) 5 MG tablet Take 1 tablet (5 mg total) by mouth at bedtime as needed for muscle spasms. 10/16/17   Caccavale, Sophia, PA-C  fluticasone (FLONASE) 50 MCG/ACT nasal spray Place 2 sprays into both nostrils daily. Patient taking differently: Place 2 sprays into both nostrils as needed.  01/16/17   Rise Mu, PA-C  hydrochlorothiazide (HYDRODIURIL) 12.5 MG tablet Take 12.5 mg by mouth daily. 06/24/17   [provider]  ibuprofen (ADVIL,MOTRIN) 800 MG tablet Take 1 tablet (800 mg total) by mouth 3 (three) times daily. Patient not taking: Reported on 08/11/2017 04/30/16   Jaynie Crumble, PA-C  lidocaine (LIDODERM) 5 % Place 1 patch onto the skin daily. Remove & Discard patch within 12 hours or as directed by MD 10/16/17   Caccavale, Sophia, PA-C  ondansetron (ZOFRAN ODT) 4 MG disintegrating tablet Take 1  tablet (4 mg total) by mouth every 8 (eight) hours as needed for nausea or vomiting. 02/16/18   Torrence Branagan, [place] N, PA-C    Family History No family history on file.  Social History Social History   Tobacco Use  . Smoking status: Former Games developer  . Smokeless tobacco: Never Used  Substance Use Topics  . Alcohol use: No  . Drug use: No     Allergies   Latex   Review of Systems Review of Systems  Constitutional: Negative for chills and fever.  Gastrointestinal: Positive for abdominal pain, diarrhea and nausea. Negative for blood in stool and vomiting.  Genitourinary: Negative for dysuria, frequency, vaginal bleeding and vaginal discharge.  All other systems reviewed and are negative.    Physical Exam Updated Vital Signs BP  113/78 (BP Location: Right Arm)   Pulse 69   Temp 98.3 F (36.8 C) (Oral)   Resp 13   SpO2 100%   Physical Exam  Constitutional: She appears well-developed and well-nourished.  Non-toxic appearance. She does not appear ill. No distress.  HENT:  Head: Normocephalic and atraumatic.  Mouth/Throat: Oropharynx is clear and moist.  Eyes: Conjunctivae are normal.  Cardiovascular: Normal rate, regular rhythm, normal heart sounds and intact distal pulses.  Pulmonary/Chest: Effort normal and breath sounds normal. No respiratory distress.  Abdominal: Soft. Bowel sounds are normal. She exhibits no distension and no mass. There is tenderness in the right lower quadrant, periumbilical area, suprapubic area and left lower quadrant. There is no rebound and no guarding.  Genitourinary: Uterus normal. Cervix exhibits discharge (brown). Cervix exhibits no motion tenderness and no friability. Right adnexum displays no mass, no tenderness and no fullness. Left adnexum displays no mass, no tenderness and no fullness. No erythema or tenderness in the vagina.  Genitourinary Comments: Exam performed with female RN chaperone present.  Neurological: She is alert.  Skin: Skin is warm.  Psychiatric: She has a normal mood and affect. Her behavior is normal.  Nursing note and vitals reviewed.    ED Treatments / Results  Labs (all labs ordered are listed, but only abnormal results are displayed) Labs Reviewed  WET PREP, GENITAL - Abnormal; Notable for the following components:      Result Value   WBC, Wet Prep HPF POC MODERATE (*)    All other components within normal limits  COMPREHENSIVE METABOLIC PANEL - Abnormal; Notable for the following components:   Potassium 2.8 (*)    Chloride 100 (*)    Glucose, Bld 106 (*)    Total Protein 8.3 (*)    All other components within normal limits  URINALYSIS, ROUTINE W REFLEX MICROSCOPIC - Abnormal; Notable for the following components:   APPearance HAZY (*)    Hgb  urine dipstick LARGE (*)    Ketones, ur 5 (*)    Squamous Epithelial / LPF 0-5 (*)    All other components within normal limits  LIPASE, BLOOD  CBC  I-STAT BETA HCG BLOOD, ED ([facility], [facility], [facility] ONLY)  GC/CHLAMYDIA PROBE AMP ([place]) NOT AT [facility]    EKG EKG Interpretation  Date/Time:  Tuesday February 16 2018 10:31:13 EDT Ventricular Rate:  59 PR Interval:    QRS Duration: 96 QT Interval:  450 QTC Calculation: 446 R Axis:   38 Text Interpretation:  Sinus rhythm Abnormal R-wave progression, early transition Borderline T wave abnormalities No significant change since last tracing Confirmed by Linwood Dibbles [phone]) on 02/16/2018 10:55:10 AM   Radiology Ct Abdomen Pelvis W Contrast  Result Date: 02/16/2018 CLINICAL DATA:  Lower abdominal pain EXAM: CT ABDOMEN AND PELVIS WITH CONTRAST TECHNIQUE: Multidetector CT imaging of the abdomen and pelvis was performed using the standard protocol following bolus administration of intravenous contrast. CONTRAST:  100mL ISOVUE-300 IOPAMIDOL (ISOVUE-300) INJECTION 61% COMPARISON:  08/11/2017 FINDINGS: Lower chest: Lung bases are clear. No effusions. Heart is normal size. Hepatobiliary: Scattered hypodensities in the liver compatible with small cysts. Gallbladder unremarkable. Pancreas: No focal abnormality or ductal dilatation. Spleen: No focal abnormality.  Normal size. Adrenals/Urinary Tract: No adrenal abnormality. No focal renal abnormality. No stones or hydronephrosis. Urinary bladder is unremarkable. Stomach/Bowel: Stomach, large and small bowel grossly unremarkable. Appendix is normal. Vascular/Lymphatic: No evidence of aneurysm or adenopathy. Reproductive: Uterus and adnexa unremarkable.  No mass. Other: No free fluid or free air. Musculoskeletal: No acute bony abnormality. IMPRESSION: No acute findings in the abdomen or pelvis. Electronically Signed   By: Charlett NoseKevin  Dover M.D.   On: 02/16/2018 09:51    Procedures Procedures (including critical care  time)  Medications Ordered in ED Medications  potassium chloride 10 mEq in 100 mL IVPB (10 mEq Intravenous New Bag/Given 02/16/18 1036)  iopamidol (ISOVUE-300) 61 % injection (has no administration in time range)  morphine 4 MG/ML injection 4 mg (4 mg Intravenous Given 02/16/18 0909)  ondansetron (ZOFRAN) injection 4 mg (4 mg Intravenous Given 02/16/18 0909)  potassium chloride SA (K-DUR,KLOR-CON) CR tablet 40 mEq (40 mEq Oral Given 02/16/18 1036)  iopamidol (ISOVUE-300) 61 % injection 100 mL (100 mLs Intravenous Contrast Given 02/16/18 0935)     Initial Impression / Assessment and Plan / ED Course  I have reviewed the triage vital signs and the nursing notes.  Pertinent labs & imaging results that were available during my care of the patient were reviewed by me and considered in my medical decision making (see chart for details).  Clinical Course as of Feb 17 1120  Tue Feb 16, 2018  0930 Pt with improvement in sx after morphine. Pelvic exam unremarkable.   [JR]    Clinical Course User Index [JR] Hershel Corkery, [place] N, PA-C    Pt w bilateral lower abdominal pain with nausea and diarrhea since yesterday. Abd soft without peritoneal signs. Pelvic exam unremarkable. Labs without leukocytosis. CMP with hypokalemia, suspect 2/t to BP medications. Potassium replaced in ED. No assoc EKG changes. CT abd/pelvis is normal.  Suspect sx 2/t viral gastroenteritis. Patient is nontoxic, nonseptic appearing, in no apparent distress.  Patient's pain and other symptoms adequately managed in emergency department. Patient discharged home with symptomatic treatment and given strict instructions for follow-up with their primary care physician.  Pt safe for discharge.  Discussed results, findings, treatment and follow up. Patient advised of return precautions. Patient verbalized understanding and agreed with plan.  Final Clinical Impressions(s) / ED Diagnoses   Final diagnoses:  Lower abdominal pain    ED Discharge  Orders        Ordered    ondansetron (ZOFRAN ODT) 4 MG disintegrating tablet  Every 8 hours PRN     02/16/18 1120       Porfiria Heinrich, [place] N, [place]PA-C 02/16/18 1121    Linwood DibblesKnapp, Jon, MD 02/17/18 1626

## 2018-02-17 LAB — GC/CHLAMYDIA PROBE AMP (~~LOC~~) NOT AT ARMC
Chlamydia: NEGATIVE
Neisseria Gonorrhea: NEGATIVE

## 2018-03-22 IMAGING — CR DG LUMBAR SPINE COMPLETE 4+V
5 series · 5 of 5 positions shown · non-contrast
Comparison: None.

CLINICAL DATA: 36-year-old female with a history of trauma and back
pain

EXAM:
LUMBAR SPINE - COMPLETE 4+ VIEW

[w lumbar spine ap]
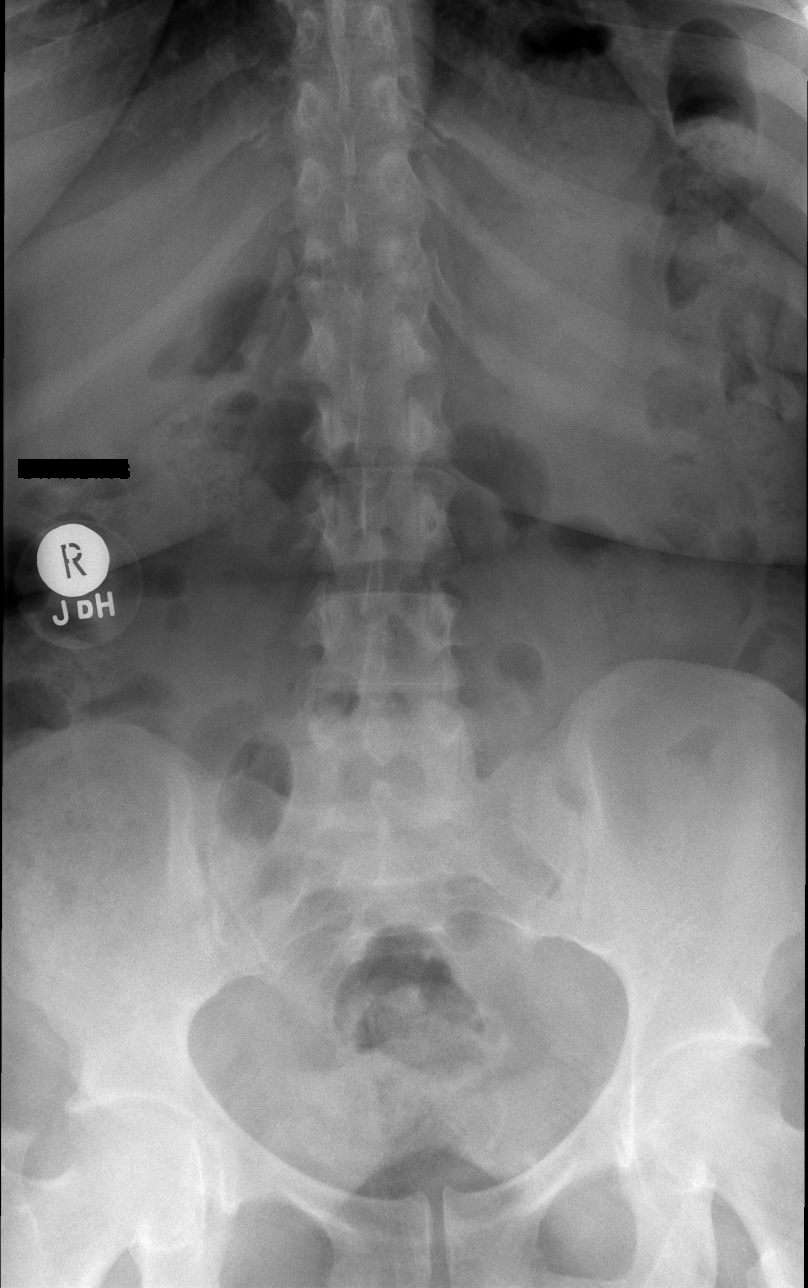

[w lumbar spine obl (1 of 2)]
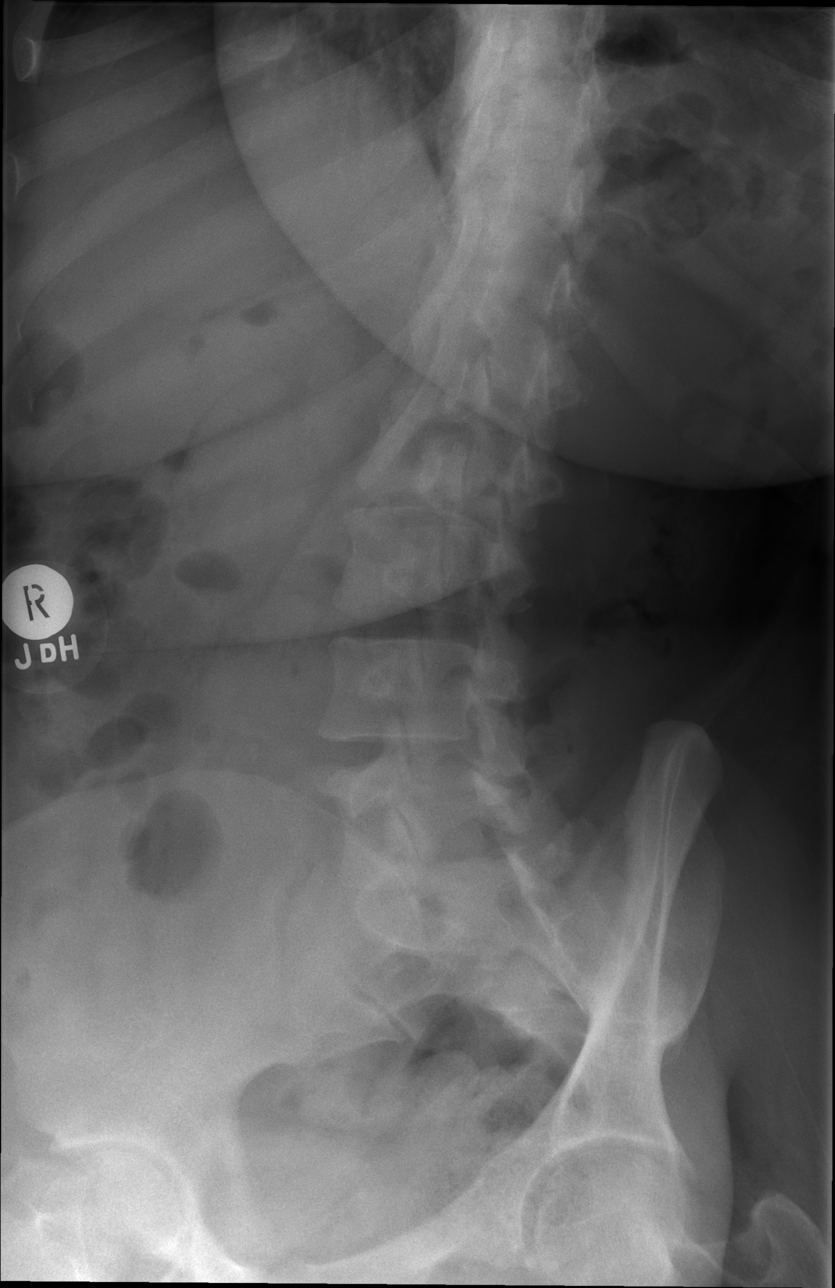

[w lumbar spine obl (2 of 2)]
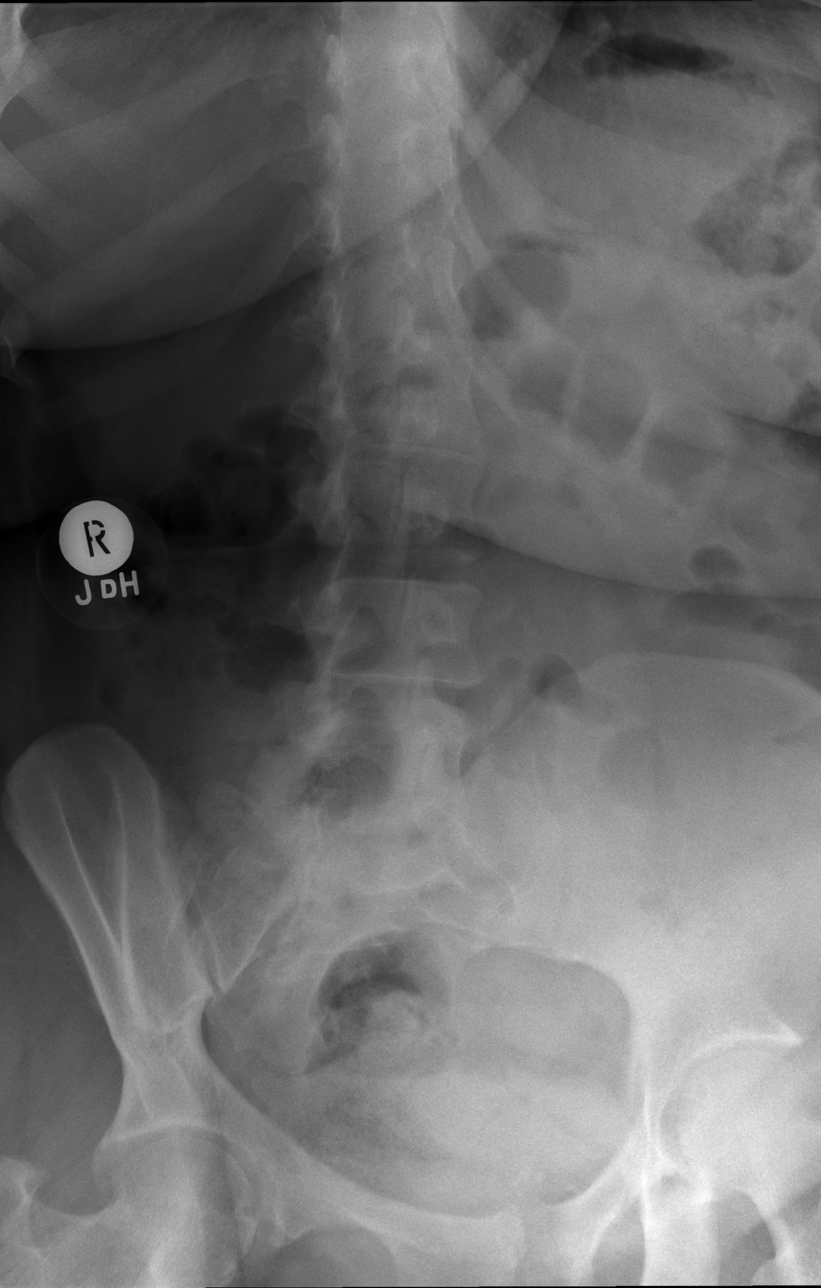

[w lumbar spine lat]
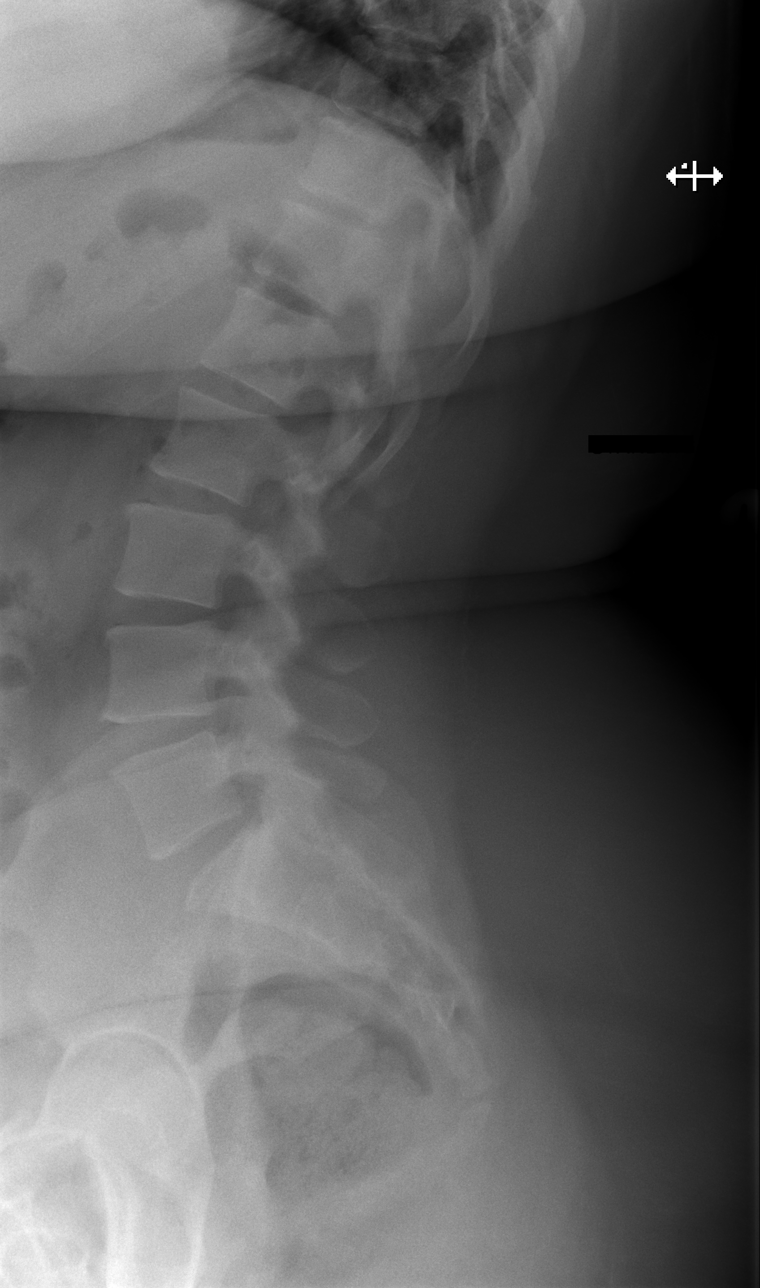

[w lumbar l-5 s-1 spot]
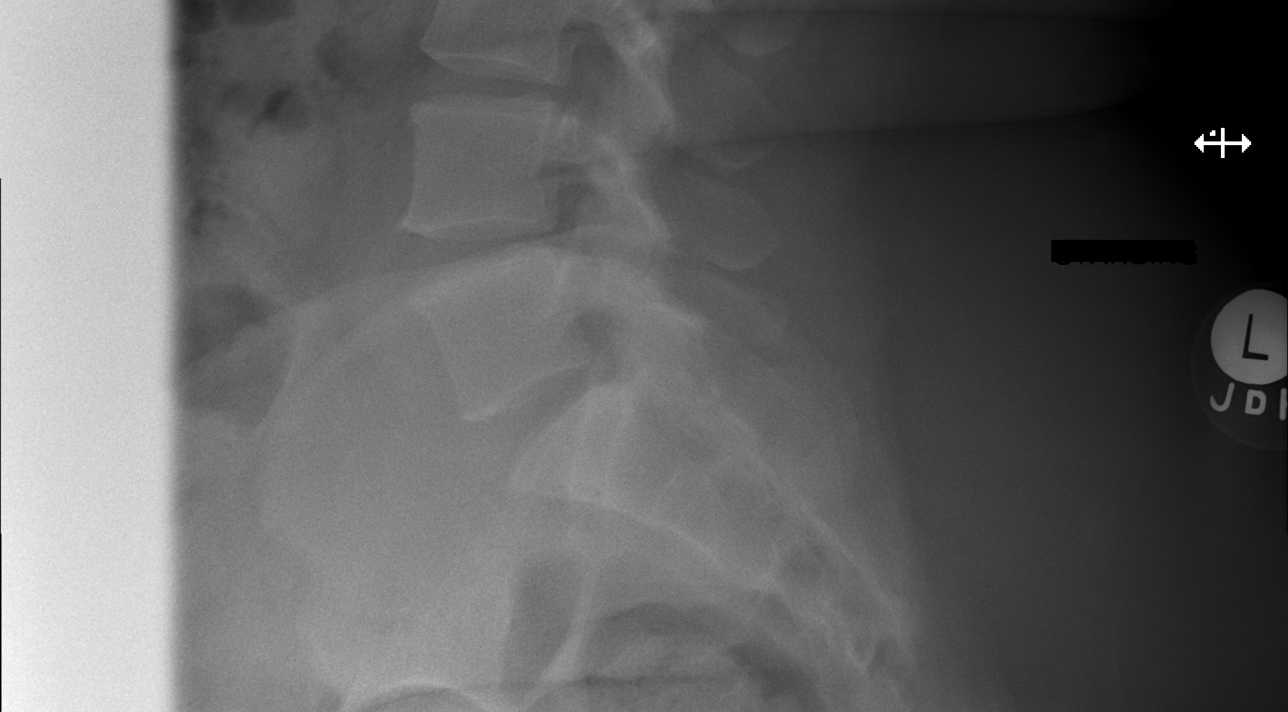

[5 of 5 positions shown; findings below may reference images not displayed]

FINDINGS: Lumbar Spine:

Lumbar vertebral elements maintain normal alignment without evidence
of anterolisthesis, retrolisthesis, subluxation.

No fracture line identified. Vertebral body heights maintained as
well as disc space heights.

No significant degenerative disc disease or endplate changes. Mild
facet changes of L4-L5 and L5-S1.

Oblique images demonstrate no displaced pars defect.
IMPRESSION: Negative for acute fracture malalignment of the lumbar spine.

## 2018-05-12 DIAGNOSIS — R7303 Prediabetes: Secondary | ICD-10-CM | POA: Diagnosis not present

## 2018-05-12 DIAGNOSIS — I10 Essential (primary) hypertension: Secondary | ICD-10-CM | POA: Diagnosis not present

## 2018-05-12 DIAGNOSIS — M79672 Pain in left foot: Secondary | ICD-10-CM | POA: Diagnosis not present

## 2018-05-12 DIAGNOSIS — M545 Low back pain: Secondary | ICD-10-CM | POA: Diagnosis not present

## 2018-05-12 DIAGNOSIS — E669 Obesity, unspecified: Secondary | ICD-10-CM | POA: Diagnosis not present

## 2018-05-31 ENCOUNTER — Encounter: Payer: Self-pay | Admitting: Podiatry

## 2018-05-31 ENCOUNTER — Ambulatory Visit: Payer: BLUE CROSS/BLUE SHIELD | Admitting: Podiatry

## 2018-05-31 ENCOUNTER — Other Ambulatory Visit: Payer: Self-pay | Admitting: Podiatry

## 2018-05-31 ENCOUNTER — Ambulatory Visit (INDEPENDENT_AMBULATORY_CARE_PROVIDER_SITE_OTHER): Payer: BLUE CROSS/BLUE SHIELD

## 2018-05-31 ENCOUNTER — Ambulatory Visit: Payer: BLUE CROSS/BLUE SHIELD

## 2018-05-31 VITALS — BP 128/75 | HR 86 | Resp 16

## 2018-05-31 DIAGNOSIS — M722 Plantar fascial fibromatosis: Secondary | ICD-10-CM

## 2018-05-31 DIAGNOSIS — R5383 Other fatigue: Secondary | ICD-10-CM | POA: Insufficient documentation

## 2018-05-31 DIAGNOSIS — M79672 Pain in left foot: Secondary | ICD-10-CM

## 2018-05-31 DIAGNOSIS — M25572 Pain in left ankle and joints of left foot: Secondary | ICD-10-CM | POA: Diagnosis not present

## 2018-05-31 MED ORDER — TRIAMCINOLONE ACETONIDE 10 MG/ML IJ SUSP
10.0000 mg | Freq: Once | INTRAMUSCULAR | Status: AC
  Administered 2018-05-31: 10 mg

## 2018-05-31 MED ORDER — DICLOFENAC SODIUM 75 MG PO TBEC: 75.0000 mg | DELAYED_RELEASE_TABLET | Freq: Two times a day (BID) | ORAL | 2 refills | Status: DC

## 2018-05-31 NOTE — Progress Notes (Signed)
   Subjective:    Patient ID: Deborah Jacobson, female    DOB: [birth year], 37 y.o.   MRN: [id number]  HPI    Review of Systems  All other systems reviewed and are negative.      Objective:   Physical Exam        Assessment & Plan:

## 2018-05-31 NOTE — Patient Instructions (Signed)
Plantar Fasciitis (Heel Spur Syndrome) with Rehab The plantar fascia is a fibrous, ligament-like, soft-tissue structure that spans the bottom of the foot. Plantar fasciitis is a condition that causes pain in the foot due to inflammation of the tissue. SYMPTOMS   Pain and tenderness on the underneath side of the foot.  Pain that worsens with standing or walking. CAUSES  Plantar fasciitis is caused by irritation and injury to the plantar fascia on the underneath side of the foot. Common mechanisms of injury include:  Direct trauma to bottom of the foot.  Damage to a small nerve that runs under the foot where the main fascia attaches to the heel bone.  Stress placed on the plantar fascia due to bone spurs. RISK INCREASES WITH:   Activities that place stress on the plantar fascia (running, jumping, pivoting, or cutting).  Poor strength and flexibility.  Improperly fitted shoes.  Tight calf muscles.  Flat feet.  Failure to warm-up properly before activity.  Obesity. PREVENTION  Warm up and stretch properly before activity.  Allow for adequate recovery between workouts.  Maintain physical fitness:  Strength, flexibility, and endurance.  Cardiovascular fitness.  Maintain a health body weight.  Avoid stress on the plantar fascia.  Wear properly fitted shoes, including arch supports for individuals who have flat feet.  PROGNOSIS  If treated properly, then the symptoms of plantar fasciitis usually resolve without surgery. However, occasionally surgery is necessary.  RELATED COMPLICATIONS   Recurrent symptoms that may result in a chronic condition.  Problems of the lower back that are caused by compensating for the injury, such as limping.  Pain or weakness of the foot during push-off following surgery.  Chronic inflammation, scarring, and partial or complete fascia tear, occurring more often from repeated injections.  TREATMENT  Treatment initially involves the  use of ice and medication to help reduce pain and inflammation. The use of strengthening and stretching exercises may help reduce pain with activity, especially stretches of the Achilles tendon. These exercises may be performed at home or with a therapist. Your caregiver may recommend that you use heel cups of arch supports to help reduce stress on the plantar fascia. Occasionally, corticosteroid injections are given to reduce inflammation. If symptoms persist for greater than 6 months despite non-surgical (conservative), then surgery may be recommended.   MEDICATION   If pain medication is necessary, then nonsteroidal anti-inflammatory medications, such as aspirin and ibuprofen, or other minor pain relievers, such as acetaminophen, are often recommended.  Do not take pain medication within 7 days before surgery.  Prescription pain relievers may be given if deemed necessary by your caregiver. Use only as directed and only as much as you need.  Corticosteroid injections may be given by your caregiver. These injections should be reserved for the most serious cases, because they may only be given a certain number of times.  HEAT AND COLD  Cold treatment (icing) relieves pain and reduces inflammation. Cold treatment should be applied for 10 to 15 minutes every 2 to 3 hours for inflammation and pain and immediately after any activity that aggravates your symptoms. Use ice packs or massage the area with a piece of ice (ice massage).  Heat treatment may be used prior to performing the stretching and strengthening activities prescribed by your caregiver, physical therapist, or athletic trainer. Use a heat pack or soak the injury in warm water.  SEEK IMMEDIATE MEDICAL CARE IF:  Treatment seems to offer no benefit, or the condition worsens.  Any medications   produce adverse side effects.  EXERCISES- RANGE OF MOTION (ROM) AND STRETCHING EXERCISES - Plantar Fasciitis (Heel Spur Syndrome) These exercises  may help you when beginning to rehabilitate your injury. Your symptoms may resolve with or without further involvement from your physician, physical therapist or athletic trainer. While completing these exercises, remember:   Restoring tissue flexibility helps normal motion to return to the joints. This allows healthier, less painful movement and activity.  An effective stretch should be held for at least 30 seconds.  A stretch should never be painful. You should only feel a gentle lengthening or release in the stretched tissue.  RANGE OF MOTION - Toe Extension, Flexion  Sit with your right / left leg crossed over your opposite knee.  Grasp your toes and gently pull them back toward the top of your foot. You should feel a stretch on the bottom of your toes and/or foot.  Hold this stretch for 10 seconds.  Now, gently pull your toes toward the bottom of your foot. You should feel a stretch on the top of your toes and or foot.  Hold this stretch for 10 seconds. Repeat  times. Complete this stretch 3 times per day.   RANGE OF MOTION - Ankle Dorsiflexion, Active Assisted  Remove shoes and sit on a chair that is preferably not on a carpeted surface.  Place right / left foot under knee. Extend your opposite leg for support.  Keeping your heel down, slide your right / left foot back toward the chair until you feel a stretch at your ankle or calf. If you do not feel a stretch, slide your bottom forward to the edge of the chair, while still keeping your heel down.  Hold this stretch for 10 seconds. Repeat 3 times. Complete this stretch 2 times per day.   STRETCH  Gastroc, Standing  Place hands on wall.  Extend right / left leg, keeping the front knee somewhat bent.  Slightly point your toes inward on your back foot.  Keeping your right / left heel on the floor and your knee straight, shift your weight toward the wall, not allowing your back to arch.  You should feel a gentle stretch  in the right / left calf. Hold this position for 10 seconds. Repeat 3 times. Complete this stretch 2 times per day.  STRETCH  Soleus, Standing  Place hands on wall.  Extend right / left leg, keeping the other knee somewhat bent.  Slightly point your toes inward on your back foot.  Keep your right / left heel on the floor, bend your back knee, and slightly shift your weight over the back leg so that you feel a gentle stretch deep in your back calf.  Hold this position for 10 seconds. Repeat 3 times. Complete this stretch 2 times per day.  STRETCH  Gastrocsoleus, Standing  Note: This exercise can place a lot of stress on your foot and ankle. Please complete this exercise only if specifically instructed by your caregiver.   Place the ball of your right / left foot on a step, keeping your other foot firmly on the same step.  Hold on to the wall or a rail for balance.  Slowly lift your other foot, allowing your body weight to press your heel down over the edge of the step.  You should feel a stretch in your right / left calf.  Hold this position for 10 seconds.  Repeat this exercise with a slight bend in your right /   left knee. Repeat 3 times. Complete this stretch 2 times per day.   STRENGTHENING EXERCISES - Plantar Fasciitis (Heel Spur Syndrome)  These exercises may help you when beginning to rehabilitate your injury. They may resolve your symptoms with or without further involvement from your physician, physical therapist or athletic trainer. While completing these exercises, remember:   Muscles can gain both the endurance and the strength needed for everyday activities through controlled exercises.  Complete these exercises as instructed by your physician, physical therapist or athletic trainer. Progress the resistance and repetitions only as guided.  STRENGTH - Towel Curls  Sit in a chair positioned on a non-carpeted surface.  Place your foot on a towel, keeping your heel  on the floor.  Pull the towel toward your heel by only curling your toes. Keep your heel on the floor. Repeat 3 times. Complete this exercise 2 times per day.  STRENGTH - Ankle Inversion  Secure one end of a rubber exercise band/tubing to a fixed object (table, pole). Loop the other end around your foot just before your toes.  Place your fists between your knees. This will focus your strengthening at your ankle.  Slowly, pull your big toe up and in, making sure the band/tubing is positioned to resist the entire motion.  Hold this position for 10 seconds.  Have your muscles resist the band/tubing as it slowly pulls your foot back to the starting position. Repeat 3 times. Complete this exercises 2 times per day.  Document Released: 10/27/2005 Document Revised: 01/19/2012 Document Reviewed: 02/08/2009 [employer] Patient Information 2014 [employer]. Achilles Tendinitis  with Rehab Achilles tendinitis is a disorder of the Achilles tendon. The Achilles tendon connects the large calf muscles (Gastrocnemius and Soleus) to the heel bone (calcaneus). This tendon is sometimes called the heel cord. It is important for pushing-off and standing on your toes and is important for walking, running, or jumping. Tendinitis is often caused by overuse and repetitive microtrauma. SYMPTOMS  Pain, tenderness, swelling, warmth, and redness may occur over the Achilles tendon even at rest.  Pain with pushing off, or flexing or extending the ankle.  Pain that is worsened after or during activity. CAUSES   Overuse sometimes seen with rapid increase in exercise programs or in sports requiring running and jumping.  Poor physical conditioning (strength and flexibility or endurance).  Running sports, especially training running down hills.  Inadequate warm-up before practice or play or failure to stretch before participation.  Injury to the tendon. PREVENTION   Warm up and stretch before practice or  competition.  Allow time for adequate rest and recovery between practices and competition.  Keep up conditioning.  Keep up ankle and leg flexibility.  Improve or keep muscle strength and endurance.  Improve cardiovascular fitness.  Use proper technique.  Use proper equipment (shoes, skates).  To help prevent recurrence, taping, protective strapping, or an adhesive bandage may be recommended for several weeks after healing is complete. PROGNOSIS   Recovery may take weeks to several months to heal.  Longer recovery is expected if symptoms have been prolonged.  Recovery is usually quicker if the inflammation is due to a direct blow as compared with overuse or sudden strain. RELATED COMPLICATIONS   Healing time will be prolonged if the condition is not correctly treated. The injury must be given plenty of time to heal.  Symptoms can reoccur if activity is resumed too soon.  Untreated, tendinitis may increase the risk of tendon rupture requiring additional time for recovery   and possibly surgery. TREATMENT   The first treatment consists of rest anti-inflammatory medication, and ice to relieve the pain.  Stretching and strengthening exercises after resolution of pain will likely help reduce the risk of recurrence. Referral to a physical therapist or athletic trainer for further evaluation and treatment may be helpful.  A walking boot or cast may be recommended to rest the Achilles tendon. This can help break the cycle of inflammation and microtrauma.  Arch supports (orthotics) may be prescribed or recommended by your caregiver as an adjunct to therapy and rest.  Surgery to remove the inflamed tendon lining or degenerated tendon tissue is rarely necessary and has shown less than predictable results. MEDICATION   Nonsteroidal anti-inflammatory medications, such as aspirin and ibuprofen, may be used for pain and inflammation relief. Do not take within 7 days before surgery. Take  these as directed by your caregiver. Contact your caregiver immediately if any bleeding, stomach upset, or signs of allergic reaction occur. Other minor pain relievers, such as acetaminophen, may also be used.  Pain relievers may be prescribed as necessary by your caregiver. Do not take prescription pain medication for longer than 4 to 7 days. Use only as directed and only as much as you need.  Cortisone injections are rarely indicated. Cortisone injections may weaken tendons and predispose to rupture. It is better to give the condition more time to heal than to use them. HEAT AND COLD  Cold is used to relieve pain and reduce inflammation for acute and chronic Achilles tendinitis. Cold should be applied for 10 to 15 minutes every 2 to 3 hours for inflammation and pain and immediately after any activity that aggravates your symptoms. Use ice packs or an ice massage.  Heat may be used before performing stretching and strengthening activities prescribed by your caregiver. Use a heat pack or a warm soak. SEEK MEDICAL CARE IF:  Symptoms get worse or do not improve in 2 weeks despite treatment.  New, unexplained symptoms develop. Drugs used in treatment may produce side effects.  EXERCISES:  RANGE OF MOTION (ROM) AND STRETCHING EXERCISES - Achilles Tendinitis  These exercises may help you when beginning to rehabilitate your injury. Your symptoms may resolve with or without further involvement from your physician, physical therapist or athletic trainer. While completing these exercises, remember:   Restoring tissue flexibility helps normal motion to return to the joints. This allows healthier, less painful movement and activity.  An effective stretch should be held for at least 30 seconds.  A stretch should never be painful. You should only feel a gentle lengthening or release in the stretched tissue.  STRETCH  Gastroc, Standing   Place hands on wall.  Extend right / left leg, keeping the  front knee somewhat bent.  Slightly point your toes inward on your back foot.  Keeping your right / left heel on the floor and your knee straight, shift your weight toward the wall, not allowing your back to arch.  You should feel a gentle stretch in the right / left calf. Hold this position for 10 seconds. Repeat 3 times. Complete this stretch 2 times per day.  STRETCH  Soleus, Standing   Place hands on wall.  Extend right / left leg, keeping the other knee somewhat bent.  Slightly point your toes inward on your back foot.  Keep your right / left heel on the floor, bend your back knee, and slightly shift your weight over the back leg so that you feel a   gentle stretch deep in your back calf.  Hold this position for 10 seconds. Repeat 3 times. Complete this stretch 2 times per day.  STRETCH  Gastrocsoleus, Standing  Note: This exercise can place a lot of stress on your foot and ankle. Please complete this exercise only if specifically instructed by your caregiver.   Place the ball of your right / left foot on a step, keeping your other foot firmly on the same step.  Hold on to the wall or a rail for balance.  Slowly lift your other foot, allowing your body weight to press your heel down over the edge of the step.  You should feel a stretch in your right / left calf.  Hold this position for 10 seconds.  Repeat this exercise with a slight bend in your knee. Repeat 3 times. Complete this stretch 2 times per day.   STRENGTHENING EXERCISES - Achilles Tendinitis These exercises may help you when beginning to rehabilitate your injury. They may resolve your symptoms with or without further involvement from your physician, physical therapist or athletic trainer. While completing these exercises, remember:   Muscles can gain both the endurance and the strength needed for everyday activities through controlled exercises.  Complete these exercises as instructed by your physician,  physical therapist or athletic trainer. Progress the resistance and repetitions only as guided.  You may experience muscle soreness or fatigue, but the pain or discomfort you are trying to eliminate should never worsen during these exercises. If this pain does worsen, stop and make certain you are following the directions exactly. If the pain is still present after adjustments, discontinue the exercise until you can discuss the trouble with your clinician.  STRENGTH - Plantar-flexors   Sit with your right / left leg extended. Holding onto both ends of a rubber exercise band/tubing, loop it around the ball of your foot. Keep a slight tension in the band.  Slowly push your toes away from you, pointing them downward.  Hold this position for 10 seconds. Return slowly, controlling the tension in the band/tubing. Repeat 3 times. Complete this exercise 2 times per day.   STRENGTH - Plantar-flexors   Stand with your feet shoulder width apart. Steady yourself with a wall or table using as little support as needed.  Keeping your weight evenly spread over the width of your feet, rise up on your toes.*  Hold this position for 10 seconds. Repeat 3 times. Complete this exercise 2 times per day.  *If this is too easy, shift your weight toward your right / left leg until you feel challenged. Ultimately, you may be asked to do this exercise with your right / left foot only.  STRENGTH  Plantar-flexors, Eccentric  Note: This exercise can place a lot of stress on your foot and ankle. Please complete this exercise only if specifically instructed by your caregiver.   Place the balls of your feet on a step. With your hands, use only enough support from a wall or rail to keep your balance.  Keep your knees straight and rise up on your toes.  Slowly shift your weight entirely to your right / left toes and pick up your opposite foot. Gently and with controlled movement, lower your weight through your right /  left foot so that your heel drops below the level of the step. You will feel a slight stretch in the back of your calf at the end position.  Use the healthy leg to help rise up onto   the balls of both feet, then lower weight only on the right / left leg again. Build up to 15 repetitions. Then progress to 3 consecutive sets of 15 repetitions.*  After completing the above exercise, complete the same exercise with a slight knee bend (about 30 degrees). Again, build up to 15 repetitions. Then progress to 3 consecutive sets of 15 repetitions.* Perform this exercise 2 times per day.  *When you easily complete 3 sets of 15, your physician, physical therapist or athletic trainer may advise you to add resistance by wearing a backpack filled with additional weight.  STRENGTH - Plantar Flexors, Seated   Sit on a chair that allows your feet to rest flat on the ground. If necessary, sit at the edge of the chair.  Keeping your toes firmly on the ground, lift your right / left heel as far as you can without increasing any discomfort in your ankle. Repeat 3 times. Complete this exercise 2 times a day.  

## 2018-06-02 NOTE — Progress Notes (Signed)
Subjective:   Patient ID: Deborah Jacobson, female   DOB: 37 y.o.   MRN: [id number]   HPI Patient presents stating of been getting pain in my heel left over right and it seems that it is gradually becoming more of an issue forming.  Patient states that she is tried changes in shoe gear support therapy without relief of symptoms and is gradually become more sore over the last month with no history of injury.  Patient likes to be active and does not currently smoke   Review of Systems  All other systems reviewed and are negative.       Objective:  Physical Exam  Constitutional: She appears well-developed and well-nourished.  Cardiovascular: Intact distal pulses.  Pulmonary/Chest: Effort normal.  Musculoskeletal: Normal range of motion.  Neurological: She is alert.  Skin: Skin is warm.  Nursing note and vitals reviewed.   Neurovascular status found to be intact muscle strength is adequate range of motion within normal limits with patient found to have acute inflammation plantar aspect left over right heel with inflammation fluid in the medial band.  Patient does have depression of the arch bilateral has had a history of ankle sprains that does not appear to be a problem now and did have a fracture approximately 10 years ago.  Patient is found to have good digital perfusion and is well oriented x3     Assessment:  Acute plantar fasciitis left with no current indication that it is related to a ankle fracture 10 years ago with no indications of hypermobility or ankle instability.  Flatfoot deformity contributing factor     Plan:  H&P x-rays reviewed and today have been a focus on the left heel.  I injected the left plantar fascia 3 mg Kenalog 5 mg Xylocaine applied fascial brace gave instructions on physical therapy and reappoint to recheck  X-ray indicates that there is small spur with no indications of stress fracture arthritis and quite a bit of depression of the arch

## 2018-06-21 ENCOUNTER — Ambulatory Visit: Payer: BLUE CROSS/BLUE SHIELD | Admitting: Podiatry

## 2018-06-21 ENCOUNTER — Encounter: Payer: Self-pay | Admitting: Podiatry

## 2018-06-21 DIAGNOSIS — M722 Plantar fascial fibromatosis: Secondary | ICD-10-CM | POA: Diagnosis not present

## 2018-06-30 DIAGNOSIS — Z79899 Other long term (current) drug therapy: Secondary | ICD-10-CM | POA: Diagnosis not present

## 2018-06-30 DIAGNOSIS — F902 Attention-deficit hyperactivity disorder, combined type: Secondary | ICD-10-CM | POA: Diagnosis not present

## 2018-06-30 DIAGNOSIS — G4733 Obstructive sleep apnea (adult) (pediatric): Secondary | ICD-10-CM | POA: Diagnosis not present

## 2018-07-13 DIAGNOSIS — M545 Low back pain: Secondary | ICD-10-CM | POA: Diagnosis not present

## 2018-07-13 DIAGNOSIS — M542 Cervicalgia: Secondary | ICD-10-CM | POA: Diagnosis not present

## 2018-07-14 DIAGNOSIS — D649 Anemia, unspecified: Secondary | ICD-10-CM | POA: Diagnosis not present

## 2018-07-14 DIAGNOSIS — R229 Localized swelling, mass and lump, unspecified: Secondary | ICD-10-CM | POA: Diagnosis not present

## 2018-07-14 DIAGNOSIS — M549 Dorsalgia, unspecified: Secondary | ICD-10-CM | POA: Diagnosis not present

## 2018-07-14 DIAGNOSIS — R7303 Prediabetes: Secondary | ICD-10-CM | POA: Diagnosis not present

## 2018-07-14 LAB — IRON,TIBC AND FERRITIN PANEL
Ferritin: 42
Iron: 40
TIBC: 316
UIBC: 276

## 2018-07-14 LAB — CBC AND DIFFERENTIAL
HCT: 36 (ref 36–46)
Hemoglobin: 11.4 — AB (ref 12.0–16.0)
Platelets: 339 (ref 150–399)
WBC: 5.7

## 2018-07-14 LAB — HEMOGLOBIN A1C: Hemoglobin A1C: 5.6

## 2018-07-14 LAB — TSH: TSH: 1.04 (ref 0.41–5.90)

## 2018-07-16 ENCOUNTER — Other Ambulatory Visit: Payer: Self-pay | Admitting: Nurse Practitioner

## 2018-07-16 DIAGNOSIS — R229 Localized swelling, mass and lump, unspecified: Secondary | ICD-10-CM

## 2018-07-20 ENCOUNTER — Ambulatory Visit
Admission: RE | Admit: 2018-07-20 | Discharge: 2018-07-20 | Disposition: A | Discharge status: Home / Self Care | Payer: BLUE CROSS/BLUE SHIELD | Source: Ambulatory Visit | Attending: Nurse Practitioner | Admitting: Nurse Practitioner

## 2018-07-20 DIAGNOSIS — R229 Localized swelling, mass and lump, unspecified: Secondary | ICD-10-CM | POA: Diagnosis not present

## 2018-07-24 ENCOUNTER — Encounter (HOSPITAL_COMMUNITY): Payer: Self-pay | Admitting: Emergency Medicine

## 2018-07-24 ENCOUNTER — Ambulatory Visit (HOSPITAL_COMMUNITY)
Admission: EM | Admit: 2018-07-24 | Discharge: 2018-07-24 | Disposition: A | Discharge status: Home / Self Care | Payer: BLUE CROSS/BLUE SHIELD | Attending: Family Medicine | Admitting: Family Medicine

## 2018-07-24 DIAGNOSIS — M542 Cervicalgia: Secondary | ICD-10-CM | POA: Diagnosis not present

## 2018-07-24 MED ORDER — TRAMADOL HCL 50 MG PO TABS: 50.0000 mg | ORAL_TABLET | Freq: Four times a day (QID) | ORAL | 0 refills | Status: DC | PRN

## 2018-07-24 MED ORDER — METHYLPREDNISOLONE SODIUM SUCC 125 MG IJ SOLR
125.0000 mg | Freq: Once | INTRAMUSCULAR | Status: AC
  Administered 2018-07-24: 125 mg via INTRAMUSCULAR

## 2018-07-24 MED ORDER — CYCLOBENZAPRINE HCL 10 MG PO TABS: 10.0000 mg | ORAL_TABLET | Freq: Two times a day (BID) | ORAL | 0 refills | Status: DC | PRN

## 2018-07-24 MED ORDER — METHYLPREDNISOLONE SODIUM SUCC 125 MG IJ SOLR
INTRAMUSCULAR | Status: AC
  Filled 2018-07-24: qty 2

## 2018-07-24 NOTE — Discharge Instructions (Addendum)
Take all medications as prescribed.  Ensure follow-up with your primary care for further work-up and management as urgent care does not manage chronic conditions.

## 2018-07-24 NOTE — ED Triage Notes (Signed)
Pt states she has neck and spine pain for the lats few months, has been given muscle relaxers with minimal relief.

## 2018-07-24 NOTE — ED Provider Notes (Signed)
[facility]    CSN: [id number] Arrival date & time: 07/24/18  1730     History   Chief Complaint Chief Complaint  Patient presents with  . Back Pain    HPI Deborah Jacobson is a 37 y.o. female.   Chronic ongoing neck pain Patient presents today for further work-up evaluation of chronic neck pain.  This condition is currently being managed by her primary care provider however she reports that she has currently ran out of medication and pain has worsened over the weekend.  She recently had a ultrasound of the neck performed which was ordered by her PCP.  However she has not received results from those studies which occurred on 07/20/2018.  Complains of persistent aching of the neck which radiates bilaterally into her shoulders.  Pain is exacerbated by any movement however she continues to have full range of motion with her neck although movement produces pain.  She complains of pain with touch and receives minimal relief from warm water and pain is worsened with cold applications.  Recent she is been managed with cyclobenzaprine 5 mg and naproxen.  Out of muscle relaxers at present and requests a refill on her medication.  She has not recently followed up with her primary care provider since receiving the ultrasound and reports previously been followed by orthopedics which was unable to determine the source of her chronic pain. Past Medical History:  Diagnosis Date  . Acid reflux   . Diabetes in pregnancy   . Hypertension   . Seasonal allergies     Patient Active Problem List   Diagnosis Date Noted  . Fatigue 05/31/2018  . Chronic low back pain 11/30/2017  . Shifting sleep-work schedule 06/15/2017  . 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 Sleep related headaches 06/15/2017  . Super obese 06/15/2017  . Mood complaints in sleep disorder 06/15/2017  . Plantar fasciitis of left foot 03/01/2014  . Porokeratosis 03/01/2014  . Pain in lower limb 03/01/2014    Past Surgical  History:  Procedure Laterality Date  . CESAREAN SECTION      OB History    Gravida  6   Para  4   Term      Preterm      AB      Living  4     SAB      TAB      Ectopic      Multiple      Live Births               Home Medications    Prior to Admission medications   Medication Sig Start Date End Date Taking? Authorizing Provider  ADDERALL XR 20 MG 24 hr capsule Take 20 mg by mouth daily. 07/27/17   [provider]  albuterol (PROVENTIL HFA;VENTOLIN HFA) 108 (90 Base) MCG/ACT inhaler Inhale 1-2 puffs into the lungs every 6 (six) hours as needed for wheezing or shortness of breath. 01/16/17   Rise Mu, PA-C  benzonatate (TESSALON) 100 MG capsule Take 1 capsule (100 mg total) by mouth every 8 (eight) hours. 04/09/16   Joy, Shawn C, PA-C  cyclobenzaprine (FLEXERIL) 5 MG tablet Take 1 tablet (5 mg total) by mouth at bedtime as needed for muscle spasms. 10/16/17   Caccavale, Sophia, PA-C  diclofenac (VOLTAREN) 75 MG EC tablet Take 1 tablet (75 mg total) by mouth 2 (two) times daily. 05/31/18   Lenn Sink, DPM  fluticasone (FLONASE) 50 MCG/ACT nasal spray Place 2  sprays into both nostrils daily. Patient taking differently: Place 2 sprays into both nostrils as needed.  01/16/17   Rise Mu, PA-C  guanFACINE (TENEX) 1 MG tablet guanfacine 1 mg tablet    [provider]  hydrochlorothiazide (HYDRODIURIL) 12.5 MG tablet Take 12.5 mg by mouth daily. 06/24/17   [provider]  hydrOXYzine (ATARAX/VISTARIL) 25 MG tablet hydroxyzine HCl 25 mg tablet  take 1 to 2 tablets by mouth NIGHTLY    [provider]  ibuprofen (ADVIL,MOTRIN) 800 MG tablet Take 1 tablet (800 mg total) by mouth 3 (three) times daily. 04/30/16   Kirichenko, Tatyana, PA-C  lidocaine (LIDODERM) 5 % Place 1 patch onto the skin daily. Remove & Discard patch within 12 hours or as directed by MD 10/16/17   Caccavale, Sophia, PA-C  meloxicam (MOBIC) 15 MG tablet  meloxicam 15 mg tablet  take 1 tablet by mouth once daily if needed for pain    [provider]  ondansetron (ZOFRAN ODT) 4 MG disintegrating tablet Take 1 tablet (4 mg total) by mouth every 8 (eight) hours as needed for nausea or vomiting. 02/16/18   Robinson, [place] N, PA-C  traMADol (ULTRAM) 50 MG tablet tramadol 50 mg tablet  take 1 tablet by mouth every 6 hours if needed    [provider]    Family History No family history on file.  Social History Social History   Tobacco Use  . Smoking status: Former Games developer  . Smokeless tobacco: Never Used  Substance Use Topics  . Alcohol use: No  . Drug use: No     Allergies   Latex   Review of Systems Review of Systems Pertinent negatives listed in HPI Physical Exam Triage Vital Signs ED Triage Vitals [07/24/18 1818]  Enc Vitals Group     BP 128/80     Pulse Rate 78     Resp 16     Temp 98.6 F (37 C)     Temp src      SpO2 100 %     Weight      Height      Head Circumference      Peak Flow      Pain Score      Pain Loc      Pain Edu?      Excl. in GC?    No data found.  Updated Vital Signs BP 128/80   Pulse 78   Temp 98.6 F (37 C)   Resp 16   SpO2 100%   Visual Acuity Right Eye Distance:   Left Eye Distance:   Bilateral Distance:    Right Eye Near:   Left Eye Near:    Bilateral Near:     Physical Exam   UC Treatments / Results  Labs (all labs ordered are listed, but only abnormal results are displayed) Labs Reviewed - No data to display  EKG None  Radiology No results found.  Procedures Procedures (including critical care time)  Medications Ordered in UC Medications - No data to display  Initial Impression / Assessment and Plan / UC Course  I have reviewed the triage vital signs and the nursing notes.  Pertinent labs & imaging results that were available during my care of the patient were reviewed by me and considered in my medical decision making (see chart for  details).   Patient presents today for further evaluation of her chronic condition in which her primary care provider Franchot Mimes at [facility] is currently managing. As this is a chronic condition and not an urgent condition I will manage conservatively today and refer patient back to her primary care provider as she is currently being worked up for this ongoing persistent pain.  She is not in any acute distress and continues to have full mobility of her neck and spine. Will administer 1 dose of Solu-Medrol 125 mg here in office IM.  Increased cyclobenzaprine to 10 mg 3 times daily. Provided a one time short course of tramadol for moderate to severe pain and advised that she should continue to have condition managed by her primary care provider.  Patient agreed with plan and verbalized understanding.    Final Clinical Impressions(s) / UC Diagnoses   Final diagnoses:  Cervical pain (neck)     Discharge Instructions     Take all medications as prescribed.  Ensure follow-up with your primary care for further work-up and management as urgent care does not manage chronic conditions.    ED Prescriptions    Medication Sig Dispense Auth. Provider   cyclobenzaprine (FLEXERIL) 10 MG tablet Take 1 tablet (10 mg total) by mouth 2 (two) times daily as needed for muscle spasms. 20 tablet Bing NeighborsHarris, Cayce Paschal S, FNP   traMADol (ULTRAM) 50 MG tablet Take 1 tablet (50 mg total) by mouth every 6 (six) hours as needed. 15 tablet Bing NeighborsHarris, Raneem Mendolia S, FNP     Controlled Substance Prescriptions [place] Controlled Substance Registry consulted? Yes, I have consulted the  Controlled Substances Registry for this patient, and feel the risk/benefit ratio today is favorable for proceeding with this prescription for a controlled substance.   Bing NeighborsHarris, Terryn Rosenkranz S, FNP 07/25/18 1001

## 2018-07-31 ENCOUNTER — Encounter: Payer: Self-pay | Admitting: Nurse Practitioner

## 2018-07-31 DIAGNOSIS — M549 Dorsalgia, unspecified: Secondary | ICD-10-CM | POA: Insufficient documentation

## 2018-07-31 DIAGNOSIS — M546 Pain in thoracic spine: Secondary | ICD-10-CM

## 2018-08-13 ENCOUNTER — Encounter: Payer: BLUE CROSS/BLUE SHIELD | Admitting: Nurse Practitioner

## 2018-09-24 DIAGNOSIS — F902 Attention-deficit hyperactivity disorder, combined type: Secondary | ICD-10-CM | POA: Diagnosis not present

## 2018-09-24 DIAGNOSIS — Z79899 Other long term (current) drug therapy: Secondary | ICD-10-CM | POA: Diagnosis not present

## 2018-09-24 DIAGNOSIS — G4733 Obstructive sleep apnea (adult) (pediatric): Secondary | ICD-10-CM | POA: Diagnosis not present

## 2018-09-24 DIAGNOSIS — F4323 Adjustment disorder with mixed anxiety and depressed mood: Secondary | ICD-10-CM | POA: Diagnosis not present

## 2018-10-11 DIAGNOSIS — F902 Attention-deficit hyperactivity disorder, combined type: Secondary | ICD-10-CM | POA: Diagnosis not present

## 2018-10-11 DIAGNOSIS — Z79899 Other long term (current) drug therapy: Secondary | ICD-10-CM | POA: Diagnosis not present

## 2018-10-11 DIAGNOSIS — F4323 Adjustment disorder with mixed anxiety and depressed mood: Secondary | ICD-10-CM | POA: Diagnosis not present

## 2018-10-29 ENCOUNTER — Other Ambulatory Visit: Payer: Self-pay | Admitting: Nurse Practitioner

## 2018-11-01 ENCOUNTER — Other Ambulatory Visit: Payer: Self-pay | Admitting: Nurse Practitioner

## 2018-11-16 ENCOUNTER — Emergency Department (HOSPITAL_COMMUNITY): Payer: BLUE CROSS/BLUE SHIELD

## 2018-11-16 ENCOUNTER — Encounter (HOSPITAL_COMMUNITY): Payer: Self-pay | Admitting: *Deleted

## 2018-11-16 ENCOUNTER — Emergency Department (HOSPITAL_COMMUNITY)
Admission: EM | Admit: 2018-11-16 | Discharge: 2018-11-16 | Disposition: A | Discharge status: Home / Self Care | Payer: BLUE CROSS/BLUE SHIELD | Attending: Emergency Medicine | Admitting: Emergency Medicine

## 2018-11-16 DIAGNOSIS — I10 Essential (primary) hypertension: Secondary | ICD-10-CM | POA: Insufficient documentation

## 2018-11-16 DIAGNOSIS — R51 Headache: Secondary | ICD-10-CM | POA: Diagnosis not present

## 2018-11-16 DIAGNOSIS — N3001 Acute cystitis with hematuria: Secondary | ICD-10-CM

## 2018-11-16 DIAGNOSIS — Z79899 Other long term (current) drug therapy: Secondary | ICD-10-CM | POA: Diagnosis not present

## 2018-11-16 DIAGNOSIS — Z87891 Personal history of nicotine dependence: Secondary | ICD-10-CM | POA: Insufficient documentation

## 2018-11-16 DIAGNOSIS — M545 Low back pain: Secondary | ICD-10-CM | POA: Diagnosis not present

## 2018-11-16 DIAGNOSIS — K7689 Other specified diseases of liver: Secondary | ICD-10-CM | POA: Diagnosis not present

## 2018-11-16 DIAGNOSIS — R1030 Lower abdominal pain, unspecified: Secondary | ICD-10-CM | POA: Diagnosis not present

## 2018-11-16 LAB — COMPREHENSIVE METABOLIC PANEL
ALT: 31 U/L (ref 0–44)
AST: 28 U/L (ref 15–41)
Albumin: 4.1 g/dL (ref 3.5–5.0)
Alkaline Phosphatase: 70 U/L (ref 38–126)
Anion gap: 8 (ref 5–15)
BUN: 9 mg/dL (ref 6–20)
CO2: 23 mmol/L (ref 22–32)
Calcium: 9 mg/dL (ref 8.9–10.3)
Chloride: 108 mmol/L (ref 98–111)
Creatinine, Ser: 0.72 mg/dL (ref 0.44–1.00)
GFR calc Af Amer: >60 mL/min (ref >60)
GFR calc non Af Amer: >60 mL/min (ref >60)
Glucose, Bld: 116 mg/dL — ABNORMAL HIGH (ref 70–99)
Potassium: 3.6 mmol/L (ref 3.5–5.1)
Sodium: 139 mmol/L (ref 135–145)
Total Bilirubin: 0.3 mg/dL (ref 0.3–1.2)
Total Protein: 7.7 g/dL (ref 6.5–8.1)

## 2018-11-16 LAB — CBC
HCT: 35.8 % — ABNORMAL LOW (ref 36.0–46.0)
Hemoglobin: 11.3 g/dL — ABNORMAL LOW (ref 12.0–15.0)
MCH: 27.5 pg (ref 26.0–34.0)
MCHC: 31.6 g/dL (ref 30.0–36.0)
MCV: 87.1 fL (ref 80.0–100.0)
Platelets: 354 10*3/uL (ref 150–400)
RBC: 4.11 MIL/uL (ref 3.87–5.11)
RDW: 15.9 % — ABNORMAL HIGH (ref 11.5–15.5)
WBC: 7.8 10*3/uL (ref 4.0–10.5)
nRBC: 0 % (ref 0.0–0.2)

## 2018-11-16 LAB — URINALYSIS, ROUTINE W REFLEX MICROSCOPIC
Bilirubin Urine: NEGATIVE
Glucose, UA: NEGATIVE mg/dL
Ketones, ur: NEGATIVE mg/dL
Leukocytes, UA: NEGATIVE
Nitrite: NEGATIVE
Protein, ur: NEGATIVE mg/dL
Specific Gravity, Urine: 1.021 (ref 1.005–1.030)
pH: 7 (ref 5.0–8.0)

## 2018-11-16 LAB — I-STAT BETA HCG BLOOD, ED (MC, WL, AP ONLY): I-stat hCG, quantitative: <5 m[IU]/mL (ref <5)

## 2018-11-16 LAB — LIPASE, BLOOD: Lipase: 31 U/L (ref 11–51)

## 2018-11-16 MED ORDER — SODIUM CHLORIDE 0.9 % IV SOLN: 1.0000 g | Freq: Once | INTRAVENOUS | Status: DC

## 2018-11-16 MED ORDER — CEPHALEXIN 500 MG PO CAPS
500.0000 mg | ORAL_CAPSULE | Freq: Once | ORAL | Status: AC
  Administered 2018-11-16: 500 mg via ORAL
  Filled 2018-11-16: qty 1

## 2018-11-16 MED ORDER — NAPROXEN 500 MG PO TABS: 500.0000 mg | ORAL_TABLET | Freq: Two times a day (BID) | ORAL | 0 refills | Status: DC

## 2018-11-16 MED ORDER — CEPHALEXIN 500 MG PO CAPS: 500.0000 mg | ORAL_CAPSULE | Freq: Three times a day (TID) | ORAL | 0 refills | Status: AC

## 2018-11-16 MED ORDER — KETOROLAC TROMETHAMINE 30 MG/ML IJ SOLN
30.0000 mg | Freq: Once | INTRAMUSCULAR | Status: AC
  Administered 2018-11-16: 30 mg via INTRAVENOUS
  Filled 2018-11-16: qty 1

## 2018-11-16 MED ORDER — ONDANSETRON HCL 4 MG/2ML IJ SOLN
4.0000 mg | Freq: Once | INTRAMUSCULAR | Status: AC
  Administered 2018-11-16: 4 mg via INTRAVENOUS
  Filled 2018-11-16: qty 2

## 2018-11-16 NOTE — ED Provider Notes (Signed)
[facility]EMERGENCY [facility]PT Provider Note   CSN: [id number] Arrival date & time: 11/16/18  1737     History   Chief Complaint Chief Complaint  Patient presents with  . Back Pain  . Abdominal Pain    HPI Deborah Jacobson is a 38 y.o. female.  HPI Pt started having pain around 3 am.  The pain is in the left lower back side.  It has been constant. The pain in the lower abd comes and goes.  The pain was pretty intense so she came to the ED.  No dysuria.  Urinating normally.  No diarrhea or constipation.    No fevers.   Past Medical History:  Diagnosis Date  . Acid reflux   . Diabetes in pregnancy   . Hypertension   . Seasonal allergies     Patient Active Problem List   Diagnosis Date Noted  . Back pain 07/31/2018  . Fatigue 05/31/2018  . Chronic low back pain 11/30/2017  . Shifting sleep-work schedule 06/15/2017  . 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 Sleep related headaches 06/15/2017  . Super obese 06/15/2017  . Mood complaints in sleep disorder 06/15/2017  . Plantar fasciitis of left foot 03/01/2014  . Porokeratosis 03/01/2014  . Pain in lower limb 03/01/2014    Past Surgical History:  Procedure Laterality Date  . CESAREAN SECTION       OB History    Gravida  6   Para  4   Term      Preterm      AB      Living  4     SAB      TAB      Ectopic      Multiple      Live Births               Home Medications    Prior to Admission medications   Medication Sig Start Date End Date Taking? Authorizing Provider  ADDERALL XR 25 MG 24 hr capsule Take 25 mg by mouth daily.  07/27/17  Yes [provider]  diclofenac (VOLTAREN) 75 MG EC tablet Take 1 tablet (75 mg total) by mouth 2 (two) times daily. 05/31/18  Yes Regal, Kirstie Peri, DPM  diphenhydramine-acetaminophen (TYLENOL PM) 25-500 MG TABS tablet Take 2 tablets by mouth at bedtime as needed (sleep).   Yes [provider]  hydrochlorothiazide (HYDRODIURIL) 12.5 MG  tablet TAKE 1 TABLET BY MOUTH EVERY DAY 11/01/18  Yes Arnette Felts, FNP  hydrOXYzine (ATARAX/VISTARIL) 25 MG tablet Take 25 mg by mouth daily as needed for anxiety.    Yes [provider]  ibuprofen (ADVIL,MOTRIN) 800 MG tablet Take 1 tablet (800 mg total) by mouth 3 (three) times daily. 04/30/16  Yes Kirichenko, Tatyana, PA-C  Semaglutide,0.25 or 0.5MG /DOS, (OZEMPIC, 0.25 OR 0.5 MG/DOSE,) 2 MG/1.5ML SOPN Inject into the skin. Inject 0.5mg  by subcutaneous route every week on the same day of each week, in the abdomen,thighs, or upper arm rotating injection sites   Yes [provider]  cephALEXin (KEFLEX) 500 MG capsule Take 1 capsule (500 mg total) by mouth 3 (three) times daily for 7 days. 11/16/18 11/23/18  Linwood Dibbles, MD  naproxen (NAPROSYN) 500 MG tablet Take 1 tablet (500 mg total) by mouth 2 (two) times daily. 11/16/18   Linwood Dibbles, MD    Family History No family history on file.  Social History Social History   Tobacco Use  . Smoking status: Former Games developer  . Smokeless  tobacco: Never Used  Substance Use Topics  . Alcohol use: No  . Drug use: No     Allergies   Latex   Review of Systems Review of Systems  Neurological: Positive for headaches.  All other systems reviewed and are negative.    Physical Exam Updated Vital Signs BP 136/85   Pulse 84   Temp 98.5 F (36.9 C) (Oral)   Resp 18   Ht 1.448 m (4\' 9" )   Wt 89.8 kg   SpO2 98%   BMI 42.85 kg/m   Physical Exam Vitals signs and nursing note reviewed.  Constitutional:      General: She is not in acute distress.    Appearance: She is well-developed.  HENT:     Head: Normocephalic and atraumatic.     Right Ear: External ear normal.     Left Ear: External ear normal.  Eyes:     General: No scleral icterus.       Right eye: No discharge.        Left eye: No discharge.     Conjunctiva/sclera: Conjunctivae normal.  Neck:     Musculoskeletal: Neck supple.     Trachea: No tracheal deviation.    Cardiovascular:     Rate and Rhythm: Normal rate and regular rhythm.  Pulmonary:     Effort: Pulmonary effort is normal. No respiratory distress.     Breath sounds: Normal breath sounds. No stridor. No wheezing or rales.  Abdominal:     General: Bowel sounds are normal. There is no distension.     Palpations: Abdomen is soft.     Tenderness: There is abdominal tenderness in the left lower quadrant. There is no guarding or rebound.  Musculoskeletal:        General: No tenderness.  Skin:    General: Skin is warm and dry.     Findings: No rash.  Neurological:     Mental Status: She is alert.     Cranial Nerves: No cranial nerve deficit (no facial droop, extraocular movements intact, no slurred speech).     Sensory: No sensory deficit.     Motor: No abnormal muscle tone or seizure activity.     Coordination: Coordination normal.      ED Treatments / Results  Labs (all labs ordered are listed, but only abnormal results are displayed) Labs Reviewed  COMPREHENSIVE METABOLIC PANEL - Abnormal; Notable for the following components:      Result Value   Glucose, Bld 116 (*)    All other components within normal limits  CBC - Abnormal; Notable for the following components:   Hemoglobin 11.3 (*)    HCT 35.8 (*)    RDW 15.9 (*)    All other components within normal limits  URINALYSIS, ROUTINE W REFLEX MICROSCOPIC - Abnormal; Notable for the following components:   APPearance HAZY (*)    Hgb urine dipstick MODERATE (*)    Bacteria, UA MANY (*)    All other components within normal limits  URINE CULTURE  LIPASE, BLOOD  I-STAT BETA HCG BLOOD, ED ([facility], [facility], [facility] ONLY)    EKG None  Radiology Ct Renal Stone Study  Result Date: 11/16/2018 CLINICAL DATA:  Left-sided flank pain EXAM: CT ABDOMEN AND PELVIS WITHOUT CONTRAST TECHNIQUE: Multidetector CT imaging of the abdomen and pelvis was performed following the standard protocol without IV contrast. COMPARISON:  02/16/2018 FINDINGS: Lower  chest: Small cysts in the right lower lobe. No acute consolidation or effusion. The heart size  is within normal limits. Hepatobiliary: Subcentimeter hypodensity in the posterior right hepatic lobe too small to further characterize. No calcified gallstone or biliary dilatation. Pancreas: Unremarkable. No pancreatic ductal dilatation or surrounding inflammatory changes. Spleen: Normal in size without focal abnormality. Adrenals/Urinary Tract: Adrenal glands are unremarkable. Kidneys are normal, without renal calculi, focal lesion, or hydronephrosis. Bladder is unremarkable. Stomach/Bowel: Stomach is within normal limits. Appendix appears normal. No evidence of bowel wall thickening, distention, or inflammatory changes. Vascular/Lymphatic: No significant vascular findings are present. No enlarged abdominal or pelvic lymph nodes. Reproductive: Uterus and bilateral adnexa are unremarkable. Other: Negative for free air or free fluid. Small fat in the umbilical region Musculoskeletal: No acute or significant osseous findings. IMPRESSION: Negative.  No CT evidence for hydronephrosis or ureteral stone. Electronically Signed   By: Jasmine PangKim  Fujinaga M.D.   On: 11/16/2018 21:35    Procedures Procedures (including critical care time)  Medications Ordered in ED Medications  cephALEXin (KEFLEX) capsule 500 mg (has no administration in time range)  ketorolac (TORADOL) 30 MG/ML injection 30 mg (30 mg Intravenous Given 11/16/18 2128)  ondansetron (ZOFRAN) injection 4 mg (4 mg Intravenous Given 11/16/18 2129)     Initial Impression / Assessment and Plan / ED Course  I have reviewed the triage vital signs and the nursing notes.  Pertinent labs & imaging results that were available during my care of the patient were reviewed by me and considered in my medical decision making (see chart for details).  Clinical Course as of Nov 16 2229  Tue Nov 16, 2018  2229 Labs unremarkable with exception of the urinalysis.  Bacteria,  pyuria and hematuria noted   [JK]  2230 CT scan without acute abnormality   [JK]    Clinical Course User Index [JK] Linwood DibblesKnapp, Jasman Pfeifle, MD  Patient presented with abdominal pain and flank pain.  No acute findings noted on CT scan.  Urinalysis is consistent with a urinary tract infection.  Patient most likely is having some upper urinary tract symptoms associated with her UTI.  Plan on discharge home with course of oral antibiotics.  Patient instructed to monitor for fever or worsening symptoms.  Otherwise follow-up with primary care doctor.  Final Clinical Impressions(s) / ED Diagnoses   Final diagnoses:  Acute cystitis with hematuria    ED Discharge Orders         Ordered    cephALEXin (KEFLEX) 500 MG capsule  3 times daily     11/16/18 2217    naproxen (NAPROSYN) 500 MG tablet  2 times daily     11/16/18 2217           Linwood DibblesKnapp, Inez Rosato, MD 11/16/18 2231

## 2018-11-16 NOTE — ED Notes (Signed)
Patient transported to CT 

## 2018-11-16 NOTE — Discharge Instructions (Signed)
Take the medications as prescribed, return to the ED for fever, vomiting, worsening symptoms

## 2018-11-16 NOTE — ED Triage Notes (Addendum)
Pt complains of left lower back, left lower abdominal pain since waking up this morning Pt denies emesis or diarrhea. Pt denies urinary symptoms. Pt tried gas-x w/o relief.

## 2018-11-18 ENCOUNTER — Other Ambulatory Visit: Payer: Self-pay

## 2018-11-18 ENCOUNTER — Emergency Department (HOSPITAL_COMMUNITY): Payer: BLUE CROSS/BLUE SHIELD

## 2018-11-18 ENCOUNTER — Emergency Department (HOSPITAL_COMMUNITY)
Admission: EM | Admit: 2018-11-18 | Discharge: 2018-11-18 | Disposition: A | Discharge status: Home / Self Care | Payer: BLUE CROSS/BLUE SHIELD | Attending: Emergency Medicine | Admitting: Emergency Medicine

## 2018-11-18 ENCOUNTER — Encounter (HOSPITAL_COMMUNITY): Payer: Self-pay | Admitting: Obstetrics and Gynecology

## 2018-11-18 DIAGNOSIS — I10 Essential (primary) hypertension: Secondary | ICD-10-CM | POA: Diagnosis not present

## 2018-11-18 DIAGNOSIS — Z91040 Latex allergy status: Secondary | ICD-10-CM | POA: Insufficient documentation

## 2018-11-18 DIAGNOSIS — M549 Dorsalgia, unspecified: Secondary | ICD-10-CM | POA: Diagnosis not present

## 2018-11-18 DIAGNOSIS — M545 Low back pain: Secondary | ICD-10-CM | POA: Diagnosis not present

## 2018-11-18 DIAGNOSIS — B3731 Acute candidiasis of vulva and vagina: Secondary | ICD-10-CM

## 2018-11-18 DIAGNOSIS — B373 Candidiasis of vulva and vagina: Secondary | ICD-10-CM | POA: Diagnosis not present

## 2018-11-18 DIAGNOSIS — Z79899 Other long term (current) drug therapy: Secondary | ICD-10-CM | POA: Insufficient documentation

## 2018-11-18 DIAGNOSIS — R1032 Left lower quadrant pain: Secondary | ICD-10-CM | POA: Diagnosis not present

## 2018-11-18 DIAGNOSIS — Z87891 Personal history of nicotine dependence: Secondary | ICD-10-CM | POA: Insufficient documentation

## 2018-11-18 DIAGNOSIS — R102 Pelvic and perineal pain: Secondary | ICD-10-CM | POA: Diagnosis not present

## 2018-11-18 LAB — BASIC METABOLIC PANEL
Anion gap: 8 (ref 5–15)
BUN: 17 mg/dL (ref 6–20)
CO2: 26 mmol/L (ref 22–32)
Calcium: 8.6 mg/dL — ABNORMAL LOW (ref 8.9–10.3)
Chloride: 108 mmol/L (ref 98–111)
Creatinine, Ser: 0.77 mg/dL (ref 0.44–1.00)
GFR calc Af Amer: >60 mL/min (ref >60)
GFR calc non Af Amer: >60 mL/min (ref >60)
Glucose, Bld: 102 mg/dL — ABNORMAL HIGH (ref 70–99)
Potassium: 3.8 mmol/L (ref 3.5–5.1)
Sodium: 142 mmol/L (ref 135–145)

## 2018-11-18 LAB — WET PREP, GENITAL
Sperm: NONE SEEN
Trich, Wet Prep: NONE SEEN

## 2018-11-18 LAB — URINALYSIS, ROUTINE W REFLEX MICROSCOPIC
Bilirubin Urine: NEGATIVE
Glucose, UA: NEGATIVE mg/dL
Ketones, ur: NEGATIVE mg/dL
Leukocytes, UA: NEGATIVE
Nitrite: NEGATIVE
Protein, ur: NEGATIVE mg/dL
Specific Gravity, Urine: 1.021 (ref 1.005–1.030)
pH: 5 (ref 5.0–8.0)

## 2018-11-18 LAB — CBC
HCT: 36.9 % (ref 36.0–46.0)
Hemoglobin: 11.3 g/dL — ABNORMAL LOW (ref 12.0–15.0)
MCH: 26.8 pg (ref 26.0–34.0)
MCHC: 30.6 g/dL (ref 30.0–36.0)
MCV: 87.4 fL (ref 80.0–100.0)
Platelets: 348 10*3/uL (ref 150–400)
RBC: 4.22 MIL/uL (ref 3.87–5.11)
RDW: 16.1 % — ABNORMAL HIGH (ref 11.5–15.5)
WBC: 6 10*3/uL (ref 4.0–10.5)
nRBC: 0 % (ref 0.0–0.2)

## 2018-11-18 LAB — URINE CULTURE: Culture: <10000 — AB

## 2018-11-18 LAB — PREGNANCY, URINE: Preg Test, Ur: NEGATIVE

## 2018-11-18 MED ORDER — FLUCONAZOLE 150 MG PO TABS: 150.0000 mg | ORAL_TABLET | Freq: Every day | ORAL | 0 refills | Status: AC

## 2018-11-18 MED ORDER — MORPHINE SULFATE (PF) 4 MG/ML IV SOLN
4.0000 mg | Freq: Once | INTRAVENOUS | Status: AC
  Administered 2018-11-18: 4 mg via INTRAVENOUS
  Filled 2018-11-18: qty 1

## 2018-11-18 MED ORDER — TRAMADOL HCL 50 MG PO TABS: 50.0000 mg | ORAL_TABLET | Freq: Four times a day (QID) | ORAL | 0 refills | Status: DC | PRN

## 2018-11-18 MED ORDER — FLUCONAZOLE 150 MG PO TABS
150.0000 mg | ORAL_TABLET | Freq: Once | ORAL | Status: AC
  Administered 2018-11-18: 150 mg via ORAL
  Filled 2018-11-18: qty 1

## 2018-11-18 NOTE — ED Triage Notes (Signed)
Pt presents to the ED with c/o flank pain on her left side x3 days. Pt reports she was recently seen here and diagnosed with a "kidney infection" and "was given a prescription for naproxen and antibiotics which is not working today"

## 2018-11-18 NOTE — Discharge Instructions (Signed)
It was my pleasure taking care of you today!   Fortunately, your work-up here was reassuring.  You did have signs of a yeast infection on pelvic exam.  You were given a dose of Diflucan here in the emergency department.  As we discussed, I have sent over a prescription for 1 more dose of Diflucan for you to take the day after you finish your course of antibiotics.   Continue Keflex.   Keep your appointment with your primary care provider.  Return to the emergency department for new or worsening symptoms, any additional concerns.

## 2018-11-18 NOTE — ED Provider Notes (Signed)
[facility]-EMERGENCY DEPT Provider Note   CSN: [id number] Arrival date & time: 11/18/18  1400     History   Chief Complaint Chief Complaint  Patient presents with  . Flank Pain    HPI Deborah Jacobson is a 38 y.o. female.  The history is provided by the patient and medical records. No language interpreter was used.   Deborah Jacobson is a 38 y.o. female  with a PMH as listed below who presents to the Emergency Department complaining of persistent left flank pain over the last 3 days.  Patient was seen in the emergency department on 1/07 for the same.  At that time, she had a CT scan which was reassuring.  Her urine showed many bacteria.  She was sent home on Keflex.  She has been taking this medication as directed without any missed doses, however feels that her pain is getting worse.  She denies any urinary symptoms or vaginal discharge.  Her main complaint is left back pain.  She does have a history of back pain due to arthritis, but states this feels different than her typical flares.  She denies any bowel or bladder incontinence, saddle anesthesia, numbness, or weakness.  No fever or chills.  She has been taking naproxen which has helped very little for her pain.  She has an appointment with her primary care doctor tomorrow for follow-up.   Past Medical History:  Diagnosis Date  . Acid reflux   . Diabetes in pregnancy   . Hypertension   . Seasonal allergies     Patient Active Problem List   Diagnosis Date Noted  . Back pain 07/31/2018  . Fatigue 05/31/2018  . Chronic low back pain 11/30/2017  . Shifting sleep-work schedule 06/15/2017  . 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 Sleep related headaches 06/15/2017  . Super obese 06/15/2017  . Mood complaints in sleep disorder 06/15/2017  . Plantar fasciitis of left foot 03/01/2014  . Porokeratosis 03/01/2014  . Pain in lower limb 03/01/2014    Past Surgical History:  Procedure Laterality Date  . CESAREAN  SECTION       OB History    Gravida  6   Para  4   Term      Preterm      AB      Living  4     SAB      TAB      Ectopic      Multiple      Live Births               Home Medications    Prior to Admission medications   Medication Sig Start Date End Date Taking? Authorizing Provider  ADDERALL XR 25 MG 24 hr capsule Take 25 mg by mouth daily.  07/27/17  Yes [provider]  cephALEXin (KEFLEX) 500 MG capsule Take 1 capsule (500 mg total) by mouth 3 (three) times daily for 7 days. 11/16/18 11/23/18 Yes Linwood Dibbles, MD  diclofenac (VOLTAREN) 75 MG EC tablet Take 1 tablet (75 mg total) by mouth 2 (two) times daily. Patient taking differently: Take 75 mg by mouth daily.  05/31/18  Yes Regal, Kirstie Peri, DPM  hydrochlorothiazide (HYDRODIURIL) 12.5 MG tablet TAKE 1 TABLET BY MOUTH EVERY DAY 11/01/18  Yes Arnette Felts, FNP  naproxen (NAPROSYN) 500 MG tablet Take 1 tablet (500 mg total) by mouth 2 (two) times daily. 11/16/18  Yes Linwood Dibbles, MD  diphenhydramine-acetaminophen (TYLENOL PM) 25-500 MG TABS tablet  Take 2 tablets by mouth at bedtime as needed (sleep).    [provider]  fluconazole (DIFLUCAN) 150 MG tablet Take 1 tablet (150 mg total) by mouth daily for 1 day. 11/18/18 11/19/18  Gerline Ratto, Chase Picket, PA-C  hydrOXYzine (ATARAX/VISTARIL) 25 MG tablet Take 25 mg by mouth daily as needed for anxiety.     [provider]  ibuprofen (ADVIL,MOTRIN) 800 MG tablet Take 1 tablet (800 mg total) by mouth 3 (three) times daily. Patient not taking: Reported on 11/18/2018 04/30/16   Jaynie Crumble, PA-C  Semaglutide,0.25 or 0.5MG /DOS, (OZEMPIC, 0.25 OR 0.5 MG/DOSE,) 2 MG/1.5ML SOPN Inject into the skin. Inject 0.5mg  by subcutaneous route every week on the same day of each week, in the abdomen,thighs, or upper arm rotating injection sites    [provider]  traMADol (ULTRAM) 50 MG tablet Take 1 tablet (50 mg total) by mouth every 6 (six) hours as needed  for severe pain. 11/18/18   Wiktoria Hemrick, Chase Picket, PA-C    Family History No family history on file.  Social History Social History   Tobacco Use  . Smoking status: Former Games developer  . Smokeless tobacco: Never Used  Substance Use Topics  . Alcohol use: No  . Drug use: No     Allergies   Latex   Review of Systems Review of Systems  Gastrointestinal: Positive for abdominal pain. Negative for nausea and vomiting.  Genitourinary: Negative for dysuria, frequency, urgency, vaginal bleeding and vaginal discharge.  Musculoskeletal: Positive for back pain. Negative for neck pain.  All other systems reviewed and are negative.    Physical Exam Updated Vital Signs BP 114/84   Pulse 83   Temp 98.7 F (37.1 C) (Oral)   Resp 16   SpO2 100%   Physical Exam Vitals signs and nursing note reviewed.  Constitutional:      General: She is not in acute distress.    Appearance: She is well-developed.  HENT:     Head: Normocephalic and atraumatic.  Cardiovascular:     Rate and Rhythm: Normal rate and regular rhythm.     Heart sounds: Normal heart sounds. No murmur.  Pulmonary:     Effort: Pulmonary effort is normal. No respiratory distress.     Breath sounds: Normal breath sounds.  Abdominal:     General: There is no distension.     Palpations: Abdomen is soft.     Comments: Suprapubic tenderness.  Genitourinary:    Comments: Chaperone present for exam. + white discharge. No CMT, but does have right adnexal tenderness. No bleeding within vaginal vault. Musculoskeletal:       Arms:  Skin:    General: Skin is warm and dry.  Neurological:     Mental Status: She is alert and oriented to person, place, and time.      ED Treatments / Results  Labs (all labs ordered are listed, but only abnormal results are displayed) Labs Reviewed  WET PREP, GENITAL - Abnormal; Notable for the following components:      Result Value   Yeast Wet Prep HPF POC PRESENT (*)    Clue Cells Wet Prep  HPF POC PRESENT (*)    WBC, Wet Prep HPF POC RARE (*)    All other components within normal limits  BASIC METABOLIC PANEL - Abnormal; Notable for the following components:   Glucose, Bld 102 (*)    Calcium 8.6 (*)    All other components within normal limits  CBC - Abnormal; Notable for  the following components:   Hemoglobin 11.3 (*)    RDW 16.1 (*)    All other components within normal limits  URINALYSIS, ROUTINE W REFLEX MICROSCOPIC - Abnormal; Notable for the following components:   Hgb urine dipstick MODERATE (*)    Bacteria, UA FEW (*)    All other components within normal limits  URINE CULTURE  PREGNANCY, URINE  POC URINE PREG, ED  GC/CHLAMYDIA PROBE AMP ([place]) [facility]    EKG None  Radiology [place] Transvaginal Non-ob  Result Date: 11/18/2018 CLINICAL DATA:  Pelvic pain for several days on the left EXAM: TRANSABDOMINAL AND TRANSVAGINAL ULTRASOUND OF PELVIS DOPPLER ULTRASOUND OF OVARIES TECHNIQUE: Both transabdominal and transvaginal ultrasound examinations of the pelvis were performed. Transabdominal technique was performed for global imaging of the pelvis including uterus, ovaries, adnexal regions, and pelvic cul-de-sac. It was necessary to proceed with endovaginal exam following the transabdominal exam to visualize the ovaries. Color and duplex Doppler ultrasound was utilized to evaluate blood flow to the ovaries. COMPARISON:  CT from 2 days previous FINDINGS: Uterus Measurements: 9.0 x 3.2 x 5.5 cm. = volume: 83 mL. No fibroids or other mass visualized. Endometrium Thickness: 9.2 mm. Small 4 mm cystic area is noted within the endometrium. Right ovary Measurements: 4.0 x 2.4 x 3.6 cm. = volume: 19 mL. Small cyst is noted measuring 2.2 cm. Left ovary Measurements: 2.8 x 1.9 x 2.5 cm. = volume: 7 mL. Normal appearance/no adnexal mass. Pulsed Doppler evaluation of both ovaries demonstrates normal low-resistance arterial and venous waveforms. Other findings Minimal free fluid is  noted likely physiologic in nature. IMPRESSION: No acute abnormality noted. Electronically Signed   By: Alcide CleverMark  Lukens M.D.   On: 11/18/2018 18:12   [place] Pelvis Complete  Result Date: 11/18/2018 CLINICAL DATA:  Pelvic pain for several days on the left EXAM: TRANSABDOMINAL AND TRANSVAGINAL ULTRASOUND OF PELVIS DOPPLER ULTRASOUND OF OVARIES TECHNIQUE: Both transabdominal and transvaginal ultrasound examinations of the pelvis were performed. Transabdominal technique was performed for global imaging of the pelvis including uterus, ovaries, adnexal regions, and pelvic cul-de-sac. It was necessary to proceed with endovaginal exam following the transabdominal exam to visualize the ovaries. Color and duplex Doppler ultrasound was utilized to evaluate blood flow to the ovaries. COMPARISON:  CT from 2 days previous FINDINGS: Uterus Measurements: 9.0 x 3.2 x 5.5 cm. = volume: 83 mL. No fibroids or other mass visualized. Endometrium Thickness: 9.2 mm. Small 4 mm cystic area is noted within the endometrium. Right ovary Measurements: 4.0 x 2.4 x 3.6 cm. = volume: 19 mL. Small cyst is noted measuring 2.2 cm. Left ovary Measurements: 2.8 x 1.9 x 2.5 cm. = volume: 7 mL. Normal appearance/no adnexal mass. Pulsed Doppler evaluation of both ovaries demonstrates normal low-resistance arterial and venous waveforms. Other findings Minimal free fluid is noted likely physiologic in nature. IMPRESSION: No acute abnormality noted. Electronically Signed   By: Alcide CleverMark  Lukens M.D.   On: 11/18/2018 18:12   [place] Art/ven Flow Abd Pelv Doppler  Result Date: 11/18/2018 CLINICAL DATA:  Pelvic pain for several days on the left EXAM: TRANSABDOMINAL AND TRANSVAGINAL ULTRASOUND OF PELVIS DOPPLER ULTRASOUND OF OVARIES TECHNIQUE: Both transabdominal and transvaginal ultrasound examinations of the pelvis were performed. Transabdominal technique was performed for global imaging of the pelvis including uterus, ovaries, adnexal regions, and pelvic cul-de-sac. It  was necessary to proceed with endovaginal exam following the transabdominal exam to visualize the ovaries. Color and duplex Doppler ultrasound was utilized to evaluate blood flow to  the ovaries. COMPARISON:  CT from 2 days previous FINDINGS: Uterus Measurements: 9.0 x 3.2 x 5.5 cm. = volume: 83 mL. No fibroids or other mass visualized. Endometrium Thickness: 9.2 mm. Small 4 mm cystic area is noted within the endometrium. Right ovary Measurements: 4.0 x 2.4 x 3.6 cm. = volume: 19 mL. Small cyst is noted measuring 2.2 cm. Left ovary Measurements: 2.8 x 1.9 x 2.5 cm. = volume: 7 mL. Normal appearance/no adnexal mass. Pulsed Doppler evaluation of both ovaries demonstrates normal low-resistance arterial and venous waveforms. Other findings Minimal free fluid is noted likely physiologic in nature. IMPRESSION: No acute abnormality noted. Electronically Signed   By: Alcide Clever M.D.   On: 11/18/2018 18:12   Ct Renal Stone Study  Result Date: 11/16/2018 CLINICAL DATA:  Left-sided flank pain EXAM: CT ABDOMEN AND PELVIS WITHOUT CONTRAST TECHNIQUE: Multidetector CT imaging of the abdomen and pelvis was performed following the standard protocol without IV contrast. COMPARISON:  02/16/2018 FINDINGS: Lower chest: Small cysts in the right lower lobe. No acute consolidation or effusion. The heart size is within normal limits. Hepatobiliary: Subcentimeter hypodensity in the posterior right hepatic lobe too small to further characterize. No calcified gallstone or biliary dilatation. Pancreas: Unremarkable. No pancreatic ductal dilatation or surrounding inflammatory changes. Spleen: Normal in size without focal abnormality. Adrenals/Urinary Tract: Adrenal glands are unremarkable. Kidneys are normal, without renal calculi, focal lesion, or hydronephrosis. Bladder is unremarkable. Stomach/Bowel: Stomach is within normal limits. Appendix appears normal. No evidence of bowel wall thickening, distention, or inflammatory changes.  Vascular/Lymphatic: No significant vascular findings are present. No enlarged abdominal or pelvic lymph nodes. Reproductive: Uterus and bilateral adnexa are unremarkable. Other: Negative for free air or free fluid. Small fat in the umbilical region Musculoskeletal: No acute or significant osseous findings. IMPRESSION: Negative.  No CT evidence for hydronephrosis or ureteral stone. Electronically Signed   By: Jasmine Pang M.D.   On: 11/16/2018 21:35    Procedures Procedures (including critical care time)  Medications Ordered in ED Medications  morphine 4 MG/ML injection 4 mg (has no administration in time range)  fluconazole (DIFLUCAN) tablet 150 mg (has no administration in time range)  morphine 4 MG/ML injection 4 mg (4 mg Intravenous Given 11/18/18 1601)     Initial Impression / Assessment and Plan / ED Course  I have reviewed the triage vital signs and the nursing notes.  Pertinent labs & imaging results that were available during my care of the patient were reviewed by me and considered in my medical decision making (see chart for details).    Deborah Jacobson is a 38 y.o. female who presents to ED for persistent left-sided back pain for the last 3 days.  Seen in the emergency department on 1/07 for same.  Chart reviewed from this encounter.  Suspected UTI as urinalysis showed many bacteria.  CT was reassuring without signs of stone or abscess.  She was started on Keflex which she has been taking as directed.  Urine culture from 2 days ago shows less than 10,000 colonies with insignificant growth.  On exam today, she is afebrile, hemodynamically stable with no abdominal tenderness.  She does have tenderness to the left back. ?  If this is actually musculoskeletal in etiology given reassuring imaging and culture results.  Given her worsening symptoms, pelvic exam was performed as well with some adnexal tenderness.  Ultrasound was then performed which was negative.  She did have some discharge and  yeast on wet prep.  I assume  this is likely secondary to her recent antibiotic use and not the cause of her discomfort today.  Given dose of Diflucan in ED today and will give prescription for 1 more dose following completion of antibiotics.  Urinalysis today not consistent with infectious etiology.  Clinically, does not appear to have Pyelo. Evaluation does not show pathology that would require ongoing emergent intervention or inpatient treatment.  She has an appointment with her primary care doctor tomorrow.  Will give short course of pain medication and let primary care doctor continue her work-up.   Patient discussed with Dr. Lynelle DoctorKnapp who agrees with treatment plan.    Final Clinical Impressions(s) / ED Diagnoses   Final diagnoses:  Acute left-sided back pain, unspecified back location  Yeast vaginitis    ED Discharge Orders         Ordered    traMADol (ULTRAM) 50 MG tablet  Every 6 hours PRN     11/18/18 1827    fluconazole (DIFLUCAN) 150 MG tablet  Daily     11/18/18 1827           Rolfe Hartsell, Chase PicketJaime Pilcher, PA-C 11/18/18 1834    Linwood DibblesKnapp, Jon, MD 11/18/18 2359

## 2018-11-19 ENCOUNTER — Encounter: Payer: BLUE CROSS/BLUE SHIELD | Admitting: Nurse Practitioner

## 2018-11-19 LAB — GC/CHLAMYDIA PROBE AMP (~~LOC~~) NOT AT ARMC
Chlamydia: NEGATIVE
Neisseria Gonorrhea: NEGATIVE

## 2018-11-19 LAB — URINE CULTURE: Culture: NO GROWTH

## 2018-12-02 ENCOUNTER — Encounter: Payer: Self-pay | Admitting: Nurse Practitioner

## 2018-12-02 ENCOUNTER — Ambulatory Visit: Payer: BLUE CROSS/BLUE SHIELD | Admitting: Nurse Practitioner

## 2018-12-02 VITALS — BP 100/68 | HR 80 | Temp 98.0°F | Ht <= 58 in | Wt 193.2 lb

## 2018-12-02 DIAGNOSIS — R7303 Prediabetes: Secondary | ICD-10-CM | POA: Diagnosis not present

## 2018-12-02 DIAGNOSIS — Z09 Encounter for follow-up examination after completed treatment for conditions other than malignant neoplasm: Secondary | ICD-10-CM | POA: Diagnosis not present

## 2018-12-02 DIAGNOSIS — S39012A Strain of muscle, fascia and tendon of lower back, initial encounter: Secondary | ICD-10-CM | POA: Diagnosis not present

## 2018-12-02 DIAGNOSIS — Z634 Disappearance and death of family member: Secondary | ICD-10-CM | POA: Diagnosis not present

## 2018-12-02 MED ORDER — CYCLOBENZAPRINE HCL 10 MG PO TABS: 10.0000 mg | ORAL_TABLET | Freq: Three times a day (TID) | ORAL | 0 refills | Status: DC | PRN

## 2018-12-02 MED ORDER — SEMAGLUTIDE(0.25 OR 0.5MG/DOS) 2 MG/1.5ML ~~LOC~~ SOPN: 0.5000 mg | PEN_INJECTOR | SUBCUTANEOUS | 6 refills | Status: DC

## 2018-12-02 NOTE — Patient Instructions (Addendum)
Back Exercises If you have pain in your back, do these exercises 2-3 times each day or as told by your doctor. When the pain goes away, do the exercises once each day, but repeat the steps more times for each exercise (do more repetitions). If you do not have pain in your back, do these exercises once each day or as told by your doctor. Exercises Single Knee to Chest Do these steps 3-5 times in a row for each leg: 1. Lie on your back on a firm bed or the floor with your legs stretched out. 2. Bring one knee to your chest. 3. Hold your knee to your chest by grabbing your knee or thigh. 4. Pull on your knee until you feel a gentle stretch in your lower back. 5. Keep doing the stretch for 10-30 seconds. 6. Slowly let go of your leg and straighten it. Pelvic Tilt Do these steps 5-10 times in a row: 1. Lie on your back on a firm bed or the floor with your legs stretched out. 2. Bend your knees so they point up to the ceiling. Your feet should be flat on the floor. 3. Tighten your lower belly (abdomen) muscles to press your lower back against the floor. This will make your tailbone point up to the ceiling instead of pointing down to your feet or the floor. 4. Stay in this position for 5-10 seconds while you gently tighten your muscles and breathe evenly. Cat-Cow Do these steps until your lower back bends more easily: 1. Get on your hands and knees on a firm surface. Keep your hands under your shoulders, and keep your knees under your hips. You may put padding under your knees. 2. Let your head hang down, and make your tailbone point down to the floor so your lower back is round like the back of a cat. 3. Stay in this position for 5 seconds. 4. Slowly lift your head and make your tailbone point up to the ceiling so your back hangs low (sags) like the back of a cow. 5. Stay in this position for 5 seconds.  Press-Ups Do these steps 5-10 times in a row: 1. Lie on your belly (face-down) on the  floor. 2. Place your hands near your head, about shoulder-width apart. 3. While you keep your back relaxed and keep your hips on the floor, slowly straighten your arms to raise the top half of your body and lift your shoulders. Do not use your back muscles. To make yourself more comfortable, you may change where you place your hands. 4. Stay in this position for 5 seconds. 5. Slowly return to lying flat on the floor.  Bridges Do these steps 10 times in a row: 1. Lie on your back on a firm surface. 2. Bend your knees so they point up to the ceiling. Your feet should be flat on the floor. 3. Tighten your butt muscles and lift your butt off of the floor until your waist is almost as high as your knees. If you do not feel the muscles working in your butt and the back of your thighs, slide your feet 1-2 inches farther away from your butt. 4. Stay in this position for 3-5 seconds. 5. Slowly lower your butt to the floor, and let your butt muscles relax. If this exercise is too easy, try doing it with your arms crossed over your chest. Belly Crunches Do these steps 5-10 times in a row: 1. Lie on your back on a  firm bed or the floor with your legs stretched out. 2. Bend your knees so they point up to the ceiling. Your feet should be flat on the floor. 3. Cross your arms over your chest. 4. Tip your chin a little bit toward your chest but do not bend your neck. 5. Tighten your belly muscles and slowly raise your chest just enough to lift your shoulder blades a tiny bit off of the floor. 6. Slowly lower your chest and your head to the floor. Back Lifts Do these steps 5-10 times in a row: 1. Lie on your belly (face-down) with your arms at your sides, and rest your forehead on the floor. 2. Tighten the muscles in your legs and your butt. 3. Slowly lift your chest off of the floor while you keep your hips on the floor. Keep the back of your head in line with the curve in your back. Look at the floor  while you do this. 4. Stay in this position for 3-5 seconds. 5. Slowly lower your chest and your face to the floor. Contact a doctor if:  Your back pain gets a lot worse when you do an exercise.  Your back pain does not lessen 2 hours after you exercise. If you have any of these problems, stop doing the exercises. Do not do them again unless your doctor says it is okay. Get help right away if:  You have sudden, very bad back pain. If this happens, stop doing the exercises. Do not do them again unless your doctor says it is okay. This information is not intended to replace advice given to you by your health care provider. Make sure you discuss any questions you have with your health care provider. Document Released: 11/29/2010 Document Revised: 07/21/2018 Document Reviewed: 12/21/2014 Elsevier Interactive Patient Education  2019 [employer]    Muscle Strain A muscle strain is an injury that happens when a muscle is stretched longer than normal. This can happen during a fall, sports, or lifting. This can tear some muscle fibers. Usually, recovery from muscle strain takes 1-2 weeks. Complete healing normally takes 5-6 weeks. This condition is first treated with PRICE therapy. This involves:  Protecting your muscle from being injured again.  Resting your injured muscle.  Icing your injured muscle.  Applying pressure (compression) to your injured muscle. This may be done with a splint or elastic bandage.  Raising (elevating) your injured muscle. Your doctor may also recommend medicine for pain. Follow these instructions at home: If you have a splint:  Wear the splint as told by your doctor. Take it off only as told by your doctor.  Loosen the splint if your fingers or toes tingle, get numb, or turn cold and blue.  Keep the splint clean.  If the splint is not waterproof: ? Do not let it get wet. ? Cover it with a watertight covering when you take a bath or a shower. Managing  pain, stiffness, and swelling   If directed, put ice on your injured area. ? If you have a removable splint, take it off as told by your doctor. ? Put ice in a plastic bag. ? Place a towel between your skin and the bag. ? Leave the ice on for 20 minutes, 2-3 times a day.  Move your fingers or toes often. This helps to avoid stiffness and lessen swelling.  Raise your injured area above the level of your heart while you are sitting or lying down.  Wear an  elastic bandage as told by your doctor. Make sure it is not too tight. General instructions  Take over-the-counter and prescription medicines only as told by your doctor.  Limit your activity. Rest your injured muscle as told by your doctor. Your doctor may say that gentle movements are okay.  If physical therapy was prescribed, do exercises as told by your doctor.  Do not put pressure on any part of the splint until it is fully hardened. This may take many hours.  Do not use any products that contain nicotine or tobacco, such as cigarettes and e-cigarettes. These can delay bone healing. If you need help quitting, ask your doctor.  Warm up before you exercise. This helps to prevent more muscle strains.  Ask your doctor when it is safe to drive if you have a splint.  Keep all follow-up visits as told by your doctor. This is important. Contact a doctor if:  You have more pain or swelling in your injured area. Get help right away if:  You have any of these problems in your injured area: ? You have numbness. ? You have tingling. ? You lose a lot of strength. Summary  A muscle strain is an injury that happens when a muscle is stretched longer than normal.  This condition is first treated with PRICE therapy. This includes protecting, resting, icing, adding pressure, and raising your injury.  Limit your activity. Rest your injured muscle as told by your doctor. Your doctor may say that gentle movements are okay.  Warm up  before you exercise. This helps to prevent more muscle strains. This information is not intended to replace advice given to you by your health care provider. Make sure you discuss any questions you have with your health care provider. Document Released: 08/05/2008 Document Revised: 12/03/2016 Document Reviewed: 12/03/2016 Elsevier Interactive Patient Education  2019 [employer].

## 2018-12-02 NOTE — Progress Notes (Signed)
Subjective:     Patient ID: Deborah Jacobson , female    DOB: Jun 04, 1981 , 38 y.o.   MRN: [id number]   Chief Complaint  Patient presents with  . ER F/u    Patient states she went to the ER and she was told she has a kidney infection and pt states she does not feel better the pain as now radiated to the other side and she feels the ER is not telling her everything.    HPI  Dysuria   This is a recurrent problem. The current episode started in the past 7 days. The problem has been unchanged. The quality of the pain is described as aching. The patient is experiencing no pain. There has been no fever. She is not sexually active. There is no history of pyelonephritis. Pertinent negatives include no chills, flank pain or hematuria. She has tried nothing for the symptoms. The treatment provided no relief. There is no history of recurrent UTIs.     Past Medical History:  Diagnosis Date  . Acid reflux   . Diabetes in pregnancy   . Hypertension   . Seasonal allergies      No family history on file.   Current Outpatient Medications:  .  ADDERALL XR 25 MG 24 hr capsule, Take 25 mg by mouth daily. , Disp: , Rfl: 0 .  diclofenac (VOLTAREN) 75 MG EC tablet, Take 1 tablet (75 mg total) by mouth 2 (two) times daily. (Patient taking differently: Take 75 mg by mouth daily. ), Disp: 50 tablet, Rfl: 2 .  diphenhydramine-acetaminophen (TYLENOL PM) 25-500 MG TABS tablet, Take 2 tablets by mouth at bedtime as needed (sleep)., Disp: , Rfl:  .  hydrochlorothiazide (HYDRODIURIL) 12.5 MG tablet, TAKE 1 TABLET BY MOUTH EVERY DAY, Disp: 90 tablet, Rfl: 0 .  hydrOXYzine (ATARAX/VISTARIL) 25 MG tablet, Take 25 mg by mouth daily as needed for anxiety. , Disp: , Rfl:  .  Semaglutide,0.25 or 0.5MG /DOS, (OZEMPIC, 0.25 OR 0.5 MG/DOSE,) 2 MG/1.5ML SOPN, Inject into the skin. Inject 0.5mg  by subcutaneous route every week on the same day of each week, in the abdomen,thighs, or upper arm rotating injection sites, Disp: , Rfl:     Allergies  Allergen Reactions  . Latex Hives    Powder from gloves     Review of Systems  Constitutional: Negative for chills and fatigue.  Eyes: Negative for photophobia.  Respiratory: Negative.   Cardiovascular: Negative.  Negative for chest pain, palpitations and leg swelling.  Genitourinary: Positive for dysuria. Negative for flank pain and hematuria.  Musculoskeletal: Positive for back pain. Negative for arthralgias and gait problem.  Neurological: Negative for dizziness and headaches.     Today's Vitals   12/02/18 1056  BP: 100/68  Pulse: 80  Temp: 98 F (36.7 C)  TempSrc: Oral  SpO2: 98%  Weight: 193 lb 3.2 oz (87.6 kg)  Height: 4\' 9"  (1.448 m)  PainSc: 6   PainLoc: Back   Body mass index is 41.81 kg/m.   Objective:  Physical Exam Constitutional:      Appearance: Normal appearance.  Cardiovascular:     Rate and Rhythm: Normal rate and regular rhythm.     Pulses: Normal pulses.     Heart sounds: No murmur.  Pulmonary:     Effort: Pulmonary effort is normal. No respiratory distress.     Breath sounds: Normal breath sounds.  Musculoskeletal:        General: Tenderness (bilateral flank pain left more than right) present.  Skin:    General: Skin is warm and dry.  Neurological:     General: No focal deficit present.     Mental Status: She is alert and oriented to person, place, and time.  Psychiatric:        Mood and Affect: Mood normal.        Behavior: Behavior normal.        Thought Content: Thought content normal.        Judgment: Judgment normal.         Assessment And Plan:     1. Prediabetes  Chronic, controlled, she is doing well with Ozempic  Continue with current medications  Encouraged to limit intake of sugary foods and drinks  Encouraged to increase physical activity to 150 minutes per week - Semaglutide,0.25 or 0.5MG /DOS, (OZEMPIC, 0.25 OR 0.5 MG/DOSE,) 2 MG/1.5ML SOPN; Inject 0.5 mg into the skin once a week. Inject 0.5mg  by  subcutaneous route every week on the same day of each week, in the abdomen,thighs, or upper arm rotating injection sites  Dispense: 1 pen; Refill: 6 - CMP14 + Anion Gap - Hemoglobin A1c  2. Back strain, initial encounter  Tenderness to low flank area left more than right, she had urinalysis done at ER which was negative and a CT scan renal and abdomen which are both normal.  - cyclobenzaprine (FLEXERIL) 10 MG tablet; Take 1 tablet (10 mg total) by mouth 3 (three) times daily as needed for muscle spasms.  Dispense: 30 tablet; Refill: 0  3. Death of family member  Recent loss of her uncle and she is having a difficult time coping  I will refer for grief counseling.   - Ambulatory referral to Psychiatry     Arnette FeltsJanece Norm Wray, FNP

## 2018-12-03 LAB — CMP14 + ANION GAP
ALT: 12 IU/L (ref 0–32)
AST: 12 IU/L (ref 0–40)
Albumin/Globulin Ratio: 1.3 (ref 1.2–2.2)
Albumin: 3.9 g/dL (ref 3.8–4.8)
Alkaline Phosphatase: 90 IU/L (ref 39–117)
Anion Gap: 15 mmol/L (ref 10.0–18.0)
BUN/Creatinine Ratio: 11 (ref 9–23)
BUN: 8 mg/dL (ref 6–20)
Bilirubin Total: <0.2 mg/dL (ref 0.0–1.2)
CO2: 25 mmol/L (ref 20–29)
Calcium: 9 mg/dL (ref 8.7–10.2)
Chloride: 100 mmol/L (ref 96–106)
Creatinine, Ser: 0.7 mg/dL (ref 0.57–1.00)
GFR calc Af Amer: 127 mL/min/{1.73_m2} (ref >59)
GFR calc non Af Amer: 110 mL/min/{1.73_m2} (ref >59)
Globulin, Total: 2.9 g/dL (ref 1.5–4.5)
Glucose: 89 mg/dL (ref 65–99)
Potassium: 3.5 mmol/L (ref 3.5–5.2)
Sodium: 140 mmol/L (ref 134–144)
Total Protein: 6.8 g/dL (ref 6.0–8.5)

## 2018-12-03 LAB — HEMOGLOBIN A1C
Est. average glucose Bld gHb Est-mCnc: 117 mg/dL
Hgb A1c MFr Bld: 5.7 % — ABNORMAL HIGH (ref 4.8–5.6)

## 2019-01-07 DIAGNOSIS — T7840XA Allergy, unspecified, initial encounter: Secondary | ICD-10-CM | POA: Diagnosis not present

## 2019-01-27 ENCOUNTER — Other Ambulatory Visit: Payer: Self-pay | Admitting: Nurse Practitioner

## 2019-02-22 ENCOUNTER — Ambulatory Visit: Payer: BLUE CROSS/BLUE SHIELD | Admitting: Nurse Practitioner

## 2019-02-22 ENCOUNTER — Other Ambulatory Visit: Payer: Self-pay

## 2019-02-22 ENCOUNTER — Encounter: Payer: Self-pay | Admitting: Nurse Practitioner

## 2019-02-22 VITALS — BP 115/82 | HR 82 | Temp 97.1°F | Wt 194.4 lb

## 2019-02-22 DIAGNOSIS — G8929 Other chronic pain: Secondary | ICD-10-CM | POA: Diagnosis not present

## 2019-02-22 DIAGNOSIS — M542 Cervicalgia: Secondary | ICD-10-CM | POA: Diagnosis not present

## 2019-02-22 DIAGNOSIS — Z7189 Other specified counseling: Secondary | ICD-10-CM

## 2019-02-22 DIAGNOSIS — M549 Dorsalgia, unspecified: Secondary | ICD-10-CM

## 2019-02-22 MED ORDER — CYCLOBENZAPRINE HCL 10 MG PO TABS: 10.0000 mg | ORAL_TABLET | Freq: Three times a day (TID) | ORAL | 0 refills | Status: DC | PRN

## 2019-02-22 MED ORDER — DICLOFENAC SODIUM 1 % TD GEL: 2.0000 g | Freq: Four times a day (QID) | TRANSDERMAL | 2 refills | Status: DC

## 2019-02-22 NOTE — Progress Notes (Addendum)
This visit type was conducted due to national recommendations for restrictions regarding the COVID-19 Pandemic (e.g. social distancing).  This format is felt to be most appropriate for this patient at this time.  All issues noted in this document were discussed and addressed.  No physical exam was performed (except for noted visual exam findings with Video Visits).  Please refer to the patient's chart (MyChart message for video visits and phone note for telephone visits) for the patient's consent [facility].   Subjective:     Patient ID: Deborah Jacobson , female    DOB: [birth year] , 38 y.o.   MRN: [id number]  Virtual Visit via [email or web address] Note  I connected with Deborah Jacobson at home on 02/22/19 at  9:45 AM EDT by telephone and verified that I am speaking with the correct person using two identifiers.   I discussed the limitations, risks, security and privacy concerns of performing an evaluation and management service by telephone and the availability of in person appointments. I also discussed with the patient that there may be a patient responsible charge related to this service. The patient expressed understanding and agreed to proceed.  Chief Complaint  Patient presents with  . Back Pain    History of Present Illness:   She has been working out more, walking more.  She is using the elipitcal machine. Not stretching.  She feels like she is stiff.  She is not working currently. She has been making herself get up.    Back Pain  This is a recurrent problem. The current episode started 1 to 4 weeks ago. The problem occurs intermittently. The quality of the pain is described as aching. Stiffness is present in the morning. Pertinent negatives include no headaches. Risk factors include obesity.  Neck Pain   This is a recurrent problem. The current episode started 1 to 4 weeks ago. Pain location: posterior neck. The quality of the pain is described as aching. Pertinent negatives  include no headaches. She has tried heat and muscle relaxants for the symptoms.     Past Medical History:  Diagnosis Date  . Acid reflux   . Diabetes in pregnancy   . Hypertension   . Seasonal allergies      No family history on file.   Current Outpatient Medications:  .  ADDERALL XR 25 MG 24 hr capsule, Take 25 mg by mouth daily. , Disp: , Rfl: 0 .  cyclobenzaprine (FLEXERIL) 10 MG tablet, Take 1 tablet (10 mg total) by mouth 3 (three) times daily as needed for muscle spasms., Disp: 30 tablet, Rfl: 0 .  hydrochlorothiazide (HYDRODIURIL) 12.5 MG tablet, TAKE 1 TABLET BY MOUTH EVERY DAY, Disp: 90 tablet, Rfl: 0 .  hydrOXYzine (ATARAX/VISTARIL) 25 MG tablet, Take 25 mg by mouth daily as needed for anxiety. , Disp: , Rfl:  .  Semaglutide,0.25 or 0.5MG /DOS, (OZEMPIC, 0.25 OR 0.5 MG/DOSE,) 2 MG/1.5ML SOPN, Inject 0.5 mg into the skin once a week. Inject 0.5mg  by subcutaneous route every week on the same day of each week, in the abdomen,thighs, or upper arm rotating injection sites, Disp: 1 pen, Rfl: 6 .  diclofenac (VOLTAREN) 75 MG EC tablet, Take 1 tablet (75 mg total) by mouth 2 (two) times daily. (Patient not taking: Reported on 02/22/2019), Disp: 50 tablet, Rfl: 2 .  diphenhydramine-acetaminophen (TYLENOL PM) 25-500 MG TABS tablet, Take 2 tablets by mouth at bedtime as needed (sleep)., Disp: , Rfl:    Allergies  Allergen Reactions  .  Latex Hives    Powder from gloves     Review of Systems  Constitutional: Negative for fatigue.  HENT: Negative.   Respiratory: Negative.   Cardiovascular: Negative.   Musculoskeletal: Positive for back pain and neck pain.  Skin: Negative.   Neurological: Negative for headaches.     Today's Vitals   02/22/19 0953  BP: 115/82  Pulse: 82  Temp: (!) 97.1 F (36.2 C)  TempSrc: Oral  Weight: 194 lb 6.4 oz (88.2 kg)    Observations/Objective: No acute distress. She has some grimacing when pressing on her upper back just below her neck.        Assessment and Plan: 1. Other chronic back pain  Likely muscle tension to upper back below the neck line  Will avoid oral NSAIDs and steroids at this time to decrease risk to decrease immune system  She is advised to use heating pad to neck as well and stretching exercises. - cyclobenzaprine (FLEXERIL) 10 MG tablet; Take 1 tablet (10 mg total) by mouth 3 (three) times daily as needed for muscle spasms.  Dispense: 30 tablet; Refill: 0 - diclofenac sodium (VOLTAREN) 1 % GEL; Apply 2 g topically 4 (four) times daily.  Dispense: 100 g; Refill: 2   Follow Up Instructions:   I discussed the assessment and treatment plan with the patient. The patient was provided an opportunity to ask questions and all were answered. The patient agreed with the plan and demonstrated an understanding of the instructions.   The patient was advised to call back or seek an in-person evaluation if the symptoms worsen or if the condition fails to improve as anticipated.  COVID-19 Education: The signs and symptoms of COVID-19 were discussed with the patient and how to seek care for testing (follow up with PCP or arrange E-visit).  The importance of social distancing was discussed today.   Patient Risk:   After full review of this patients clinical status, I feel that they are at least moderate risk at this time.   I provided 14 minutes of non-face-to-face time during this encounter.   Arnette Felts, FNP

## 2019-03-03 ENCOUNTER — Telehealth: Payer: Self-pay

## 2019-03-03 ENCOUNTER — Encounter: Payer: BLUE CROSS/BLUE SHIELD | Admitting: Nurse Practitioner

## 2019-03-03 NOTE — Telephone Encounter (Signed)
LVM for pt to call to have missed physical  appt rescheduled 03/03/19

## 2019-03-03 NOTE — Telephone Encounter (Signed)
Left pt v/m to call office so we can reschedule her appointment that she missed today on 04/23. YRL,RMA

## 2019-03-09 ENCOUNTER — Telehealth: Payer: Self-pay

## 2019-03-09 NOTE — Telephone Encounter (Signed)
LVM for pt to call office to reschedule physical appt she missed 03/09/19

## 2019-03-15 ENCOUNTER — Telehealth: Payer: Self-pay

## 2019-03-15 NOTE — Telephone Encounter (Signed)
Called to reschedule pt physical

## 2019-03-23 ENCOUNTER — Other Ambulatory Visit: Payer: Self-pay

## 2019-03-23 ENCOUNTER — Ambulatory Visit: Payer: BLUE CROSS/BLUE SHIELD | Admitting: Nurse Practitioner

## 2019-03-23 ENCOUNTER — Encounter: Payer: Self-pay | Admitting: Nurse Practitioner

## 2019-03-23 VITALS — BP 110/78 | HR 85 | Temp 97.7°F | Ht <= 58 in | Wt 196.0 lb

## 2019-03-23 DIAGNOSIS — Z6841 Body Mass Index (BMI) 40.0 and over, adult: Secondary | ICD-10-CM

## 2019-03-23 DIAGNOSIS — R7303 Prediabetes: Secondary | ICD-10-CM

## 2019-03-23 DIAGNOSIS — Z23 Encounter for immunization: Secondary | ICD-10-CM | POA: Diagnosis not present

## 2019-03-23 DIAGNOSIS — Z1231 Encounter for screening mammogram for malignant neoplasm of breast: Secondary | ICD-10-CM | POA: Diagnosis not present

## 2019-03-23 DIAGNOSIS — Z0000 Encounter for general adult medical examination without abnormal findings: Secondary | ICD-10-CM | POA: Insufficient documentation

## 2019-03-23 LAB — POCT URINALYSIS DIPSTICK
Glucose, UA: NEGATIVE
Ketones, UA: NEGATIVE
Leukocytes, UA: NEGATIVE
Nitrite, UA: NEGATIVE
Protein, UA: POSITIVE — AB
Spec Grav, UA: 1.025 (ref 1.010–1.025)
Urobilinogen, UA: 0.2 E.U./dL
pH, UA: 5.5 (ref 5.0–8.0)

## 2019-03-23 MED ORDER — ALBUTEROL SULFATE HFA 108 (90 BASE) MCG/ACT IN AERS: 2.0000 | INHALATION_SPRAY | Freq: Four times a day (QID) | RESPIRATORY_TRACT | 2 refills | Status: DC | PRN

## 2019-03-23 MED ORDER — TETANUS-DIPHTH-ACELL PERTUSSIS 5-2.5-18.5 LF-MCG/0.5 IM SUSP
0.5000 mL | Freq: Once | INTRAMUSCULAR | Status: AC
  Administered 2019-03-23: 0.5 mL via INTRAMUSCULAR

## 2019-03-23 NOTE — Progress Notes (Signed)
Subjective:     Patient ID: Deborah Jacobson , female    DOB: 05-26-1981 , 38 y.o.   MRN: [id number]   Chief Complaint  Patient presents with  . Annual Exam   The patient states she uses tubal ligation for birth control. Last LMP was Patient's last menstrual period was 03/20/2019.. Negative for Dysmenorrhea and Negative for Menorrhagia Mammogram not been don.  Negative for: breast discharge, breast lump(s), breast pain and breast self exam.  Pertinent negatives include abnormal bleeding (hematology), anxiety, decreased libido, depression, difficulty falling sleep, dyspareunia, history of infertility, nocturia, sexual dysfunction, sleep disturbances, urinary incontinence, urinary urgency, vaginal discharge and vaginal itching. Diet regular.The patient states her exercise level is  daily with walking and elliptical and crunches. Limited amount of water.  Drinks juice daily reports adds water.   The patient's tobacco use is:  Social History   Tobacco Use  Smoking Status Former Smoker  Smokeless Tobacco Never Used   She has been exposed to passive smoke. The patient's alcohol use is:   Social History   Substance and Sexual Activity  Alcohol Use No   Additional information: [facility] and was normal, next one scheduled for 2021.   HPI  Here for HM  Hypertension  This is a chronic problem. The current episode started more than 1 year ago. The problem is controlled. Pertinent negatives include no anxiety, chest pain or palpitations. There are no known risk factors for coronary artery disease. Past treatments include diuretics. The current treatment provides significant improvement. There are no compliance problems.  There is no history of angina. There is no history of chronic renal disease.     Past Medical History:  Diagnosis Date  . Acid reflux   . Diabetes in pregnancy   . Hypertension   . Seasonal allergies      No family history on  file.   Current Outpatient Medications:  .  cetirizine (ZYRTEC) 10 MG tablet, Take 10 mg by mouth daily., Disp: , Rfl:  .  cyclobenzaprine (FLEXERIL) 10 MG tablet, Take 1 tablet (10 mg total) by mouth 3 (three) times daily as needed for muscle spasms., Disp: 30 tablet, Rfl: 0 .  hydrochlorothiazide (HYDRODIURIL) 12.5 MG tablet, TAKE 1 TABLET BY MOUTH EVERY DAY, Disp: 90 tablet, Rfl: 0 .  hydrOXYzine (ATARAX/VISTARIL) 25 MG tablet, Take 25 mg by mouth daily as needed for anxiety. , Disp: , Rfl:  .  Semaglutide,0.25 or 0.5MG /DOS, (OZEMPIC, 0.25 OR 0.5 MG/DOSE,) 2 MG/1.5ML SOPN, Inject 0.5 mg into the skin once a week. Inject 0.5mg  by subcutaneous route every week on the same day of each week, in the abdomen,thighs, or upper arm rotating injection sites, Disp: 1 pen, Rfl: 6 .  ADDERALL XR 25 MG 24 hr capsule, Take 25 mg by mouth daily. , Disp: , Rfl: 0   Allergies  Allergen Reactions  . Latex Hives    Powder from gloves     Review of Systems  Constitutional: Negative.   HENT: Negative.   Eyes: Negative.   Respiratory: Negative.   Cardiovascular: Negative.  Negative for chest pain, palpitations and leg swelling.  Gastrointestinal: Negative.   Endocrine: Negative.   Genitourinary: Negative.   Musculoskeletal: Negative.   Skin: Negative.   Allergic/Immunologic: Negative.   Neurological: Negative.   Hematological: Negative.   Psychiatric/Behavioral: Negative.      Today's Vitals   03/23/19 1206  BP: 110/78  Pulse: 85  Temp: 97.7 F (36.5 C)  TempSrc: Oral  Weight: 196 lb (88.9 kg)  Height: 4' 8.6" (1.438 m)  PainSc: 0-No pain   Body mass index is 43.02 kg/m.   Objective:  Physical Exam Vitals signs reviewed.  Constitutional:      Appearance: Normal appearance. She is well-developed.  HENT:     Head: Normocephalic and atraumatic.     Right Ear: Hearing, tympanic membrane, ear canal and external ear normal.     Left Ear: Hearing, tympanic membrane, ear canal and  external ear normal.     Nose: Nose normal.     Mouth/Throat:     Mouth: Mucous membranes are moist.  Eyes:     General: Lids are normal.     Conjunctiva/sclera: Conjunctivae normal.     Pupils: Pupils are equal, round, and reactive to light.     Funduscopic exam:    Right eye: No papilledema.        Left eye: No papilledema.  Neck:     Musculoskeletal: Full passive range of motion without pain, normal range of motion and neck supple.     Thyroid: No thyroid mass.     Vascular: No carotid bruit.  Cardiovascular:     Rate and Rhythm: Normal rate and regular rhythm.     Pulses: Normal pulses.     Heart sounds: Normal heart sounds. No murmur.  Pulmonary:     Effort: Pulmonary effort is normal.     Breath sounds: Normal breath sounds.  Abdominal:     General: Abdomen is flat. Bowel sounds are normal.     Palpations: Abdomen is soft.  Musculoskeletal: Normal range of motion.        General: No swelling.     Right lower leg: No edema.     Left lower leg: No edema.  Skin:    General: Skin is warm and dry.     Capillary Refill: Capillary refill takes less than 2 seconds.  Neurological:     General: No focal deficit present.     Mental Status: She is alert and oriented to person, place, and time.     Cranial Nerves: No cranial nerve deficit.     Sensory: No sensory deficit.  Psychiatric:        Mood and Affect: Mood normal.        Behavior: Behavior normal.        Thought Content: Thought content normal.        Judgment: Judgment normal.         Assessment And Plan:     1. Health maintenance examination . Behavior modifications discussed and diet history reviewed.   . Pt will continue to exercise regularly and modify diet with low GI, plant based foods and decrease intake of processed foods.  . Recommend intake of daily multivitamin, Vitamin D, and calcium.  . Recommend mammogram and colonoscopy for preventive screenings, as well as recommend immunizations that include  influenza, TDAP - POCT Urinalysis Dipstick (81002)  2. Encounter for immunization  Will give tetanus vaccine today while in office. Refer to order management. TDAP will be administered to adults 5018-39 years old every 10 years. - Tdap (BOOSTRIX) injection 0.5 mL  3. Encounter for screening mammogram for breast cancer  Pt instructed on Self Breast Exam.According to ACOG guidelines Women aged 39 and older are recommended to get an annual mammogram. Form completed and given to patient contact the [facility] for appointment scheduing.   Pt encouraged to get annual mammogram -  MM Digital Screening; Future  4. Prediabetes  Chronic, stable  Tolerating ozempic well  Continue with current medications  Encouraged to limit intake of sugary foods and drinks  Encouraged to increase physical activity to 150 minutes per week - Hemoglobin A1c - CMP14 + Anion Gap - CBC no Diff - Lipid Profile  5. Class 3 severe obesity due to excess calories without serious comorbidity with body mass index (BMI) of 40.0 to 44.9 in adult [facility])  Chronic  Discussed healthy diet and regular exercise options   Encouraged to exercise at least 150 minutes per week with 2 days of strength training   Arnette Felts, FNP    THE PATIENT IS ENCOURAGED TO PRACTICE SOCIAL DISTANCING DUE TO THE COVID-19 PANDEMIC.

## 2019-03-24 LAB — CBC
Hematocrit: 35.8 % (ref 34.0–46.6)
Hemoglobin: 11.8 g/dL (ref 11.1–15.9)
MCH: 27.8 pg (ref 26.6–33.0)
MCHC: 33 g/dL (ref 31.5–35.7)
MCV: 84 fL (ref 79–97)
Platelets: 352 10*3/uL (ref 150–450)
RBC: 4.25 x10E6/uL (ref 3.77–5.28)
RDW: 14.7 % (ref 11.7–15.4)
WBC: 5.2 10*3/uL (ref 3.4–10.8)

## 2019-03-24 LAB — CMP14 + ANION GAP
ALT: 11 IU/L (ref 0–32)
AST: 15 IU/L (ref 0–40)
Albumin/Globulin Ratio: 1.5 (ref 1.2–2.2)
Albumin: 4.3 g/dL (ref 3.8–4.8)
Alkaline Phosphatase: 95 IU/L (ref 39–117)
Anion Gap: 16 mmol/L (ref 10.0–18.0)
BUN/Creatinine Ratio: 15 (ref 9–23)
BUN: 12 mg/dL (ref 6–20)
Bilirubin Total: <0.2 mg/dL (ref 0.0–1.2)
CO2: 25 mmol/L (ref 20–29)
Calcium: 9.1 mg/dL (ref 8.7–10.2)
Chloride: 98 mmol/L (ref 96–106)
Creatinine, Ser: 0.8 mg/dL (ref 0.57–1.00)
GFR calc Af Amer: 108 mL/min/{1.73_m2} (ref >59)
GFR calc non Af Amer: 94 mL/min/{1.73_m2} (ref >59)
Globulin, Total: 2.9 g/dL (ref 1.5–4.5)
Glucose: 87 mg/dL (ref 65–99)
Potassium: 3.5 mmol/L (ref 3.5–5.2)
Sodium: 139 mmol/L (ref 134–144)
Total Protein: 7.2 g/dL (ref 6.0–8.5)

## 2019-03-24 LAB — HEMOGLOBIN A1C
Est. average glucose Bld gHb Est-mCnc: 117 mg/dL
Hgb A1c MFr Bld: 5.7 % — ABNORMAL HIGH (ref 4.8–5.6)

## 2019-03-24 LAB — LIPID PANEL
Chol/HDL Ratio: 2.9 ratio (ref 0.0–4.4)
Cholesterol, Total: 146 mg/dL (ref 100–199)
HDL: 50 mg/dL (ref >39)
LDL Calculated: 87 mg/dL (ref 0–99)
Triglycerides: 45 mg/dL (ref 0–149)
VLDL Cholesterol Cal: 9 mg/dL (ref 5–40)

## 2019-06-23 ENCOUNTER — Ambulatory Visit: Payer: BC Managed Care – PPO | Admitting: Nurse Practitioner

## 2019-07-07 ENCOUNTER — Ambulatory Visit: Payer: BC Managed Care – PPO | Admitting: Nurse Practitioner

## 2019-08-08 ENCOUNTER — Other Ambulatory Visit: Payer: Self-pay | Admitting: Nurse Practitioner

## 2019-08-18 ENCOUNTER — Telehealth: Payer: Self-pay

## 2019-08-18 NOTE — Telephone Encounter (Signed)
Patient stated she is taking her ozempic as directed and she just picked up a refill last week. YRL,RMA

## 2019-08-30 DIAGNOSIS — F4323 Adjustment disorder with mixed anxiety and depressed mood: Secondary | ICD-10-CM | POA: Diagnosis not present

## 2019-08-30 DIAGNOSIS — G4733 Obstructive sleep apnea (adult) (pediatric): Secondary | ICD-10-CM | POA: Diagnosis not present

## 2019-08-30 DIAGNOSIS — F902 Attention-deficit hyperactivity disorder, combined type: Secondary | ICD-10-CM | POA: Diagnosis not present

## 2019-08-30 DIAGNOSIS — F419 Anxiety disorder, unspecified: Secondary | ICD-10-CM | POA: Diagnosis not present

## 2019-08-30 DIAGNOSIS — Z79899 Other long term (current) drug therapy: Secondary | ICD-10-CM | POA: Diagnosis not present

## 2019-09-10 ENCOUNTER — Other Ambulatory Visit: Payer: Self-pay | Admitting: Nurse Practitioner

## 2019-09-23 DIAGNOSIS — Z20828 Contact with and (suspected) exposure to other viral communicable diseases: Secondary | ICD-10-CM | POA: Diagnosis not present

## 2019-09-23 DIAGNOSIS — K30 Functional dyspepsia: Secondary | ICD-10-CM | POA: Diagnosis not present

## 2019-09-23 DIAGNOSIS — Z7189 Other specified counseling: Secondary | ICD-10-CM | POA: Diagnosis not present

## 2019-09-26 DIAGNOSIS — Z111 Encounter for screening for respiratory tuberculosis: Secondary | ICD-10-CM | POA: Diagnosis not present

## 2019-10-08 ENCOUNTER — Other Ambulatory Visit: Payer: Self-pay | Admitting: Nurse Practitioner

## 2019-10-08 DIAGNOSIS — R7303 Prediabetes: Secondary | ICD-10-CM

## 2019-10-13 ENCOUNTER — Ambulatory Visit (HOSPITAL_COMMUNITY)
Admission: EM | Admit: 2019-10-13 | Discharge: 2019-10-13 | Disposition: A | Discharge status: Home / Self Care | Payer: BC Managed Care – PPO | Attending: Family Medicine | Admitting: Family Medicine

## 2019-10-13 ENCOUNTER — Other Ambulatory Visit: Payer: Self-pay

## 2019-10-13 ENCOUNTER — Encounter (HOSPITAL_COMMUNITY): Payer: Self-pay | Admitting: Emergency Medicine

## 2019-10-13 DIAGNOSIS — L089 Local infection of the skin and subcutaneous tissue, unspecified: Secondary | ICD-10-CM

## 2019-10-13 DIAGNOSIS — L0889 Other specified local infections of the skin and subcutaneous tissue: Secondary | ICD-10-CM

## 2019-10-13 DIAGNOSIS — B9689 Other specified bacterial agents as the cause of diseases classified elsewhere: Secondary | ICD-10-CM

## 2019-10-13 DIAGNOSIS — M542 Cervicalgia: Secondary | ICD-10-CM

## 2019-10-13 MED ORDER — TIZANIDINE HCL 4 MG PO TABS: 4.0000 mg | ORAL_TABLET | Freq: Four times a day (QID) | ORAL | 0 refills | Status: DC | PRN

## 2019-10-13 MED ORDER — CEPHALEXIN 500 MG PO CAPS: 500.0000 mg | ORAL_CAPSULE | Freq: Two times a day (BID) | ORAL | 0 refills | Status: DC

## 2019-10-13 NOTE — Discharge Instructions (Addendum)
Take ibuprofen for pain.  You may take over-the-counter ibuprofen 800 mg 3 times a day Take Zanaflex as needed as a muscle relaxer.  This is useful at bedtime Take the antibiotic 2 times a day for 5 days Avoid squeezing the bump on the back of your neck Warm compresses 2 or 3 times a day

## 2019-10-13 NOTE — ED Triage Notes (Signed)
Patient has a pimple/bump on back of neck/hairline.  Patient noticed this area about a week ago.  Patient now complains of headache, neck and upper back.  Patient has taken tylenol arthritis (made her dizzy).    Patient has been squeezing this bump.  Now feels like muscles swollen, tense

## 2019-10-13 NOTE — ED Provider Notes (Signed)
[facility]    CSN: [id number] Arrival date & time: 10/13/19  1724      History   Chief Complaint Chief Complaint  Patient presents with  . Neck Pain    HPI Deborah Jacobson is a 38 y.o. female.   HPI  Patient is a small abscess on the back of her neck.  She keeps squeezing this area because she thinks that there is something in it.  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  She states that she has been working on it so much that now her neck is sore.  She has pain and spasm in the neck muscles and pain with turning her head.  She took some Tylenol.  This did not help with her pain.  She is here for evaluation.  Past Medical History:  Diagnosis Date  . Acid reflux   . Diabetes in pregnancy   . Hypertension   . Seasonal allergies     Patient Active Problem List   Diagnosis Date Noted  . Health maintenance examination 03/23/2019  . Prediabetes 12/02/2018  . Strain of back 12/02/2018  . Death of family member 12/02/2018  . Back pain 07/31/2018  . Fatigue 05/31/2018  . Chronic low back pain 11/30/2017  . Shifting sleep-work schedule 06/15/2017  . 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 Sleep related headaches 06/15/2017  . Super obese 06/15/2017  . Mood complaints in sleep disorder 06/15/2017  . Plantar fasciitis of left foot 03/01/2014  . Porokeratosis 03/01/2014  . Pain in lower limb 03/01/2014    Past Surgical History:  Procedure Laterality Date  . CESAREAN SECTION      OB History    Gravida  6   Para  4   Term      Preterm      AB      Living  4     SAB      TAB      Ectopic      Multiple      Live Births               Home Medications    Prior to Admission medications   Medication Sig Start Date End Date Taking? Authorizing Provider  ADDERALL XR 25 MG 24 hr capsule Take 25 mg by mouth daily.  07/27/17  Yes [provider]  hydrochlorothiazide (HYDRODIURIL) 12.5 MG tablet  TAKE 1 TABLET(12.5 MG) BY MOUTH DAILY 10/10/19  Yes Arnette FeltsMoore, Janece, FNP  albuterol (VENTOLIN HFA) 108 (90 Base) MCG/ACT inhaler Inhale 2 puffs into the lungs every 6 (six) hours as needed for wheezing or shortness of breath. 03/23/19   Arnette FeltsMoore, Janece, FNP  cephALEXin (KEFLEX) 500 MG capsule Take 1 capsule (500 mg total) by mouth 2 (two) times daily. 10/13/19   Eustace MooreNelson, Christien Frankl Sue, MD  OZEMPIC, 0.25 OR 0.5 MG/DOSE, 2 MG/1.5ML SOPN INJECT 0.5MG  INTO SKIN ONCE A WEEK, SAME DAY EACH WEEK ROTATING SITES, ABDOMEN,THIGH,UPPERARM. 10/10/19   Arnette FeltsMoore, Janece, FNP  tiZANidine (ZANAFLEX) 4 MG tablet Take 1-2 tablets (4-8 mg total) by mouth every 6 (six) hours as needed for muscle spasms. 10/13/19   Eustace MooreNelson, [place] Sue, MD  cetirizine (ZYRTEC) 10 MG tablet Take 10 mg by mouth daily.  10/13/19  [provider]    Family History Family History  Problem Relation Age of Onset  . Diabetes Mother   . Liver disease Mother   . Hypertension Mother  Social History Social History   Tobacco Use  . Smoking status: Former Research scientist (life sciences)  . Smokeless tobacco: Never Used  Substance Use Topics  . Alcohol use: No  . Drug use: No     Allergies   Latex   Review of Systems Review of Systems  Constitutional: Negative for chills and fever.  HENT: Negative for ear pain and sore throat.   Eyes: Negative for pain and visual disturbance.  Respiratory: Negative for cough and shortness of breath.   Cardiovascular: Negative for chest pain and palpitations.  Gastrointestinal: Negative for abdominal pain and vomiting.  Genitourinary: Negative for dysuria and hematuria.  Musculoskeletal: Positive for neck pain and neck stiffness. Negative for arthralgias and back pain.  Skin: Positive for wound. Negative for color change and rash.  Neurological: Negative for seizures and syncope.  All other systems reviewed and are negative.    Physical Exam Triage Vital Signs ED Triage Vitals  Enc Vitals Group     BP 10/13/19 1802  120/79     Pulse Rate 10/13/19 1802 82     Resp 10/13/19 1802 18     Temp 10/13/19 1802 98.4 F (36.9 C)     Temp Source 10/13/19 1802 Oral     SpO2 10/13/19 1802 100 %     Weight --      Height --      Head Circumference --      Peak Flow --      Pain Score 10/13/19 1756 8     Pain Loc --      Pain Edu? --      Excl. in [place]? --    No data found.  Updated Vital Signs BP 120/79 (BP Location: Right Arm)   Pulse 82   Temp 98.4 F (36.9 C) (Oral)   Resp 18   LMP 10/03/2019   SpO2 100%   Physical Exam Constitutional:      General: She is not in acute distress.    Appearance: She is well-developed. She is obese.  HENT:     Head: Normocephalic and atraumatic.     Mouth/Throat:     Comments: Mask in place Eyes:     Conjunctiva/sclera: Conjunctivae normal.     Pupils: Pupils are equal, round, and reactive to light.  Neck:     Musculoskeletal: Normal range of motion. Muscular tenderness present.     Comments: Full but slow range of motion.  There is a small indurated area that is hyperpigmented with an eschar on top, very tender, 1 cm nodule.  Did not feel fluctuant.  There are right-sided anterior cervical nodes that are tender.  Upper body trapezius muscles bilaterally are tender but not tense Cardiovascular:     Rate and Rhythm: Normal rate.  Pulmonary:     Effort: Pulmonary effort is normal. No respiratory distress.  Abdominal:     General: There is no distension.     Palpations: Abdomen is soft.  Musculoskeletal: Normal range of motion.  Skin:    General: Skin is warm and dry.  Neurological:     General: No focal deficit present.     Mental Status: She is alert.  Psychiatric:        Mood and Affect: Mood normal.        Behavior: Behavior normal.      UC Treatments / Results  Labs (all labs ordered are listed, but only abnormal results are displayed) Labs Reviewed - No data to display  EKG  Radiology No results found.  Procedures Procedures  (including critical care time)  Medications Ordered in UC Medications - No data to display  Initial Impression / Assessment and Plan / UC Course  I have reviewed the triage vital signs and the nursing notes.  Pertinent labs & imaging results that were available during my care of the patient were reviewed by me and considered in my medical decision making (see chart for details).     Uncertain whether the neck muscular pain is related to the minor skin infection.  We will treat both Final Clinical Impressions(s) / UC Diagnoses   Final diagnoses:  Neck pain  Bacterial skin infection     Discharge Instructions     Take ibuprofen for pain.  You may take over-the-counter ibuprofen 800 mg 3 times a day Take Zanaflex as needed as a muscle relaxer.  This is useful at bedtime Take the antibiotic 2 times a day for 5 days Avoid squeezing the bump on the back of your neck Warm compresses 2 or 3 times a day   ED Prescriptions    Medication Sig Dispense Auth. Provider   tiZANidine (ZANAFLEX) 4 MG tablet Take 1-2 tablets (4-8 mg total) by mouth every 6 (six) hours as needed for muscle spasms. 21 tablet Eustace Moore, MD   cephALEXin (KEFLEX) 500 MG capsule Take 1 capsule (500 mg total) by mouth 2 (two) times daily. 10 capsule Eustace Moore, MD     PDMP not reviewed this encounter.   Eustace Moore, MD 10/13/19 [phone]

## 2019-11-25 ENCOUNTER — Emergency Department (HOSPITAL_COMMUNITY)
Admission: EM | Admit: 2019-11-25 | Discharge: 2019-11-25 | Disposition: A | Discharge status: Home / Self Care | Payer: BC Managed Care – PPO | Attending: Emergency Medicine | Admitting: Emergency Medicine

## 2019-11-25 ENCOUNTER — Encounter (HOSPITAL_COMMUNITY): Payer: Self-pay

## 2019-11-25 ENCOUNTER — Other Ambulatory Visit: Payer: Self-pay

## 2019-11-25 DIAGNOSIS — Z87891 Personal history of nicotine dependence: Secondary | ICD-10-CM | POA: Insufficient documentation

## 2019-11-25 DIAGNOSIS — R519 Headache, unspecified: Secondary | ICD-10-CM | POA: Diagnosis not present

## 2019-11-25 DIAGNOSIS — Z79899 Other long term (current) drug therapy: Secondary | ICD-10-CM | POA: Diagnosis not present

## 2019-11-25 DIAGNOSIS — E876 Hypokalemia: Secondary | ICD-10-CM

## 2019-11-25 DIAGNOSIS — Z91040 Latex allergy status: Secondary | ICD-10-CM | POA: Diagnosis not present

## 2019-11-25 DIAGNOSIS — E119 Type 2 diabetes mellitus without complications: Secondary | ICD-10-CM | POA: Diagnosis not present

## 2019-11-25 DIAGNOSIS — I10 Essential (primary) hypertension: Secondary | ICD-10-CM | POA: Insufficient documentation

## 2019-11-25 DIAGNOSIS — G44209 Tension-type headache, unspecified, not intractable: Secondary | ICD-10-CM | POA: Diagnosis not present

## 2019-11-25 LAB — BASIC METABOLIC PANEL
Anion gap: 9 (ref 5–15)
BUN: 7 mg/dL (ref 6–20)
CO2: 27 mmol/L (ref 22–32)
Calcium: 9.3 mg/dL (ref 8.9–10.3)
Chloride: 101 mmol/L (ref 98–111)
Creatinine, Ser: 0.68 mg/dL (ref 0.44–1.00)
GFR calc Af Amer: >60 mL/min (ref >60)
GFR calc non Af Amer: >60 mL/min (ref >60)
Glucose, Bld: 89 mg/dL (ref 70–99)
Potassium: 3.2 mmol/L — ABNORMAL LOW (ref 3.5–5.1)
Sodium: 137 mmol/L (ref 135–145)

## 2019-11-25 LAB — CBC WITH DIFFERENTIAL/PLATELET
Abs Immature Granulocytes: 0.02 10*3/uL (ref 0.00–0.07)
Basophils Absolute: 0.1 10*3/uL (ref 0.0–0.1)
Basophils Relative: 1 %
Eosinophils Absolute: 0.1 10*3/uL (ref 0.0–0.5)
Eosinophils Relative: 2 %
HCT: 38.5 % (ref 36.0–46.0)
Hemoglobin: 12.5 g/dL (ref 12.0–15.0)
Immature Granulocytes: 0 %
Lymphocytes Relative: 39 %
Lymphs Abs: 2.8 10*3/uL (ref 0.7–4.0)
MCH: 27.8 pg (ref 26.0–34.0)
MCHC: 32.5 g/dL (ref 30.0–36.0)
MCV: 85.6 fL (ref 80.0–100.0)
Monocytes Absolute: 0.4 10*3/uL (ref 0.1–1.0)
Monocytes Relative: 6 %
Neutro Abs: 3.8 10*3/uL (ref 1.7–7.7)
Neutrophils Relative %: 52 %
Platelets: 346 10*3/uL (ref 150–400)
RBC: 4.5 MIL/uL (ref 3.87–5.11)
RDW: 14.7 % (ref 11.5–15.5)
WBC: 7.3 10*3/uL (ref 4.0–10.5)
nRBC: 0 % (ref 0.0–0.2)

## 2019-11-25 LAB — I-STAT BETA HCG BLOOD, ED (MC, WL, AP ONLY): I-stat hCG, quantitative: <5 m[IU]/mL (ref <5)

## 2019-11-25 LAB — CBG MONITORING, ED: Glucose-Capillary: 81 mg/dL (ref 70–99)

## 2019-11-25 MED ORDER — POTASSIUM CHLORIDE ER 10 MEQ PO TBCR: 30.0000 meq | EXTENDED_RELEASE_TABLET | Freq: Every day | ORAL | 0 refills | Status: DC

## 2019-11-25 MED ORDER — SODIUM CHLORIDE 0.9 % IV BOLUS
1000.0000 mL | Freq: Once | INTRAVENOUS | Status: AC
  Administered 2019-11-25: 1000 mL via INTRAVENOUS

## 2019-11-25 MED ORDER — DIPHENHYDRAMINE HCL 50 MG/ML IJ SOLN
25.0000 mg | Freq: Once | INTRAMUSCULAR | Status: AC
  Administered 2019-11-25: 25 mg via INTRAVENOUS
  Filled 2019-11-25: qty 1

## 2019-11-25 MED ORDER — METOCLOPRAMIDE HCL 5 MG/ML IJ SOLN
10.0000 mg | Freq: Once | INTRAMUSCULAR | Status: AC
  Administered 2019-11-25: 10 mg via INTRAVENOUS
  Filled 2019-11-25: qty 2

## 2019-11-25 NOTE — ED Triage Notes (Signed)
Patient c/o headache that started yesterday around 6:30 pm    6/10 left and right temporal areas   Patient also reports starting back adderall yesterday at 8:30 am yesterday morning and states she knows it increases her BP and causes a headache.   Patient reports she has not taken adderall in 2 months.   Patient also reports she is dehydrated.  Patient took tylenol last night around 10 pm and this morning around 5 am with 4 bottles of water.    Hx. Hypertension and states she is compliant with her medications.

## 2019-11-25 NOTE — ED Provider Notes (Signed)
[facility]EMERGENCY [facility]PT Provider Note   CSN: [id number] Arrival date & time: 11/25/19  4098     History Chief Complaint  Patient presents with  . Headache    Deborah Jacobson is a 39 y.o. female with a past medical history of hypertension on hydrochlorothiazide, ADHD on Adderall presenting to the ED with a chief complaint of headache.  Yesterday evening started having tension type of headache with blood pressure readings of 120/100 on her home machine.  She took 1 dose of Tylenol with some improvement in her headache.  She attributes the headache to restarting her Adderall after being off of it for 2 months.  Reports similar symptoms happened to her in the past when she restarted her Adderall as well.  She is also concerned that she is dehydrated as she states "I'm not really a water drinker. This past year was hard and it was just coffee and sodas and I started reading about how important drinking water is."  Because of this she has drank several bottles of water since yesterday.  She does note increased stress last night related to the whereabouts of her son which she believes worsened her headache and hypertension.  She took another dose of Tylenol early this morning.  Headache improved but is still persistent.  She denies any known personal family history of aneurysms, head injuries, vision changes, nausea or vomiting, shortness of breath, numbness in arms or legs, neck pain or stiffness, fever.  Her blood pressure has been well controlled with her hydrochlorothiazide for the past several years except in situations like this.  HPI     Past Medical History:  Diagnosis Date  . Acid reflux   . Diabetes in pregnancy   . Hypertension   . Seasonal allergies     Patient Active Problem List   Diagnosis Date Noted  . Health maintenance examination 03/23/2019  . Prediabetes 12/26/18  . Strain of back 12-26-2018  . Death of family member December 26, 2018  . Back pain  07/31/2018  . Fatigue 05/31/2018  . Chronic low back pain 11/30/2017  . Shifting sleep-work schedule 06/15/2017  . 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 Sleep related headaches 06/15/2017  . Super obese 06/15/2017  . Mood complaints in sleep disorder 06/15/2017  . Plantar fasciitis of left foot 03/01/2014  . Porokeratosis 03/01/2014  . Pain in lower limb 03/01/2014    Past Surgical History:  Procedure Laterality Date  . CESAREAN SECTION       OB History    Gravida  6   Para  4   Term      Preterm      AB      Living  4     SAB      TAB      Ectopic      Multiple      Live Births              Family History  Problem Relation Age of Onset  . Diabetes Mother   . Liver disease Mother   . Hypertension Mother     Social History   Tobacco Use  . Smoking status: Former Games developer  . Smokeless tobacco: Never Used  Substance Use Topics  . Alcohol use: No  . Drug use: No    Home Medications Prior to Admission medications   Medication Sig Start Date End Date Taking? Authorizing Provider  ADDERALL XR 25 MG 24 hr capsule Take 25 mg by mouth daily.  07/27/17  Yes [provider]  hydrochlorothiazide (HYDRODIURIL) 12.5 MG tablet TAKE 1 TABLET(12.5 MG) BY MOUTH DAILY Patient taking differently: Take 12.5 mg by mouth daily.  10/10/19  Yes Minette Brine, FNP  OVER THE COUNTER MEDICATION Take 1 tablet by mouth once a week. Vitamin C pk   Yes [provider]  OZEMPIC, 0.25 OR 0.5 MG/DOSE, 2 MG/1.5ML SOPN INJECT 0.5MG  INTO SKIN ONCE A WEEK, SAME DAY EACH WEEK ROTATING SITES, ABDOMEN,THIGH,UPPERARM. Patient taking differently: Inject 0.5 mg into the skin once a week. Monday 10/10/19  Yes Minette Brine, FNP  tiZANidine (ZANAFLEX) 4 MG tablet Take 1-2 tablets (4-8 mg total) by mouth every 6 (six) hours as needed for muscle spasms. 10/13/19  Yes Raylene Everts, MD  albuterol (VENTOLIN HFA) 108 (90 Base) MCG/ACT inhaler Inhale 2 puffs into the lungs every 6  (six) hours as needed for wheezing or shortness of breath. Patient not taking: Reported on 11/25/2019 03/23/19   Minette Brine, FNP  cephALEXin (KEFLEX) 500 MG capsule Take 1 capsule (500 mg total) by mouth 2 (two) times daily. Patient not taking: Reported on 11/25/2019 10/13/19   Raylene Everts, MD  potassium chloride (KLOR-CON) 10 MEQ tablet Take 3 tablets (30 mEq total) by mouth daily for 3 days. 11/25/19 11/28/19  Idolina Mantell, PA-C  cetirizine (ZYRTEC) 10 MG tablet Take 10 mg by mouth daily.  10/13/19  [provider]    Allergies    Latex  Review of Systems   Review of Systems  Constitutional: Negative for appetite change, chills and fever.  HENT: Negative for ear pain, rhinorrhea, sneezing and sore throat.   Eyes: Negative for photophobia and visual disturbance.  Respiratory: Negative for cough, chest tightness, shortness of breath and wheezing.   Cardiovascular: Negative for chest pain and palpitations.  Gastrointestinal: Negative for abdominal pain, blood in stool, constipation, diarrhea, nausea and vomiting.  Genitourinary: Negative for dysuria, hematuria and urgency.  Musculoskeletal: Negative for myalgias.  Skin: Negative for rash.  Neurological: Positive for headaches. Negative for dizziness, weakness and light-headedness.    Physical Exam Updated Vital Signs BP (!) 138/92 (BP Location: Left Arm)   Pulse 96   Temp 99.1 F (37.3 C) (Oral)   Resp 18   SpO2 100%   Physical Exam Vitals and nursing note reviewed.  Constitutional:      General: She is not in acute distress.    Appearance: She is well-developed.  HENT:     Head: Normocephalic and atraumatic.     Nose: Nose normal.  Eyes:     General: No scleral icterus.       Right eye: No discharge.        Left eye: No discharge.     Conjunctiva/sclera: Conjunctivae normal.     Pupils: Pupils are equal, round, and reactive to light.  Neck:     Comments: No meningismus. Cardiovascular:     Rate and  Rhythm: Normal rate and regular rhythm.     Heart sounds: Normal heart sounds. No murmur. No friction rub. No gallop.   Pulmonary:     Effort: Pulmonary effort is normal. No respiratory distress.     Breath sounds: Normal breath sounds.  Abdominal:     General: Bowel sounds are normal. There is no distension.     Palpations: Abdomen is soft.     Tenderness: There is no abdominal tenderness. There is no guarding.  Musculoskeletal:        General: Normal range of motion.  Cervical back: Normal range of motion and neck supple.  Skin:    General: Skin is warm and dry.     Findings: No rash.  Neurological:     General: No focal deficit present.     Mental Status: She is alert and oriented to person, place, and time.     Cranial Nerves: No cranial nerve deficit.     Sensory: No sensory deficit.     Motor: No weakness or abnormal muscle tone.     Coordination: Coordination normal.     Comments: Pupils reactive. No facial asymmetry noted. Cranial nerves appear grossly intact. Sensation intact to light touch on face, BUE and BLE. Strength 5/5 in BUE and BLE.      ED Results / Procedures / Treatments   Labs (all labs ordered are listed, but only abnormal results are displayed) Labs Reviewed  BASIC METABOLIC PANEL - Abnormal; Notable for the following components:      Result Value   Potassium 3.2 (*)    All other components within normal limits  CBC WITH DIFFERENTIAL/PLATELET  CBG MONITORING, ED  I-STAT BETA HCG BLOOD, ED ([facility], [facility], [facility] ONLY)    EKG None  Radiology No results found.  Procedures Procedures (including critical care time)  Medications Ordered in ED Medications  sodium chloride 0.9 % bolus 1,000 mL (1,000 mLs Intravenous New Bag/Given 11/25/19 1233)  metoCLOPramide (REGLAN) injection 10 mg (10 mg Intravenous Given 11/25/19 1229)  diphenhydrAMINE (BENADRYL) injection 25 mg (25 mg Intravenous Given 11/25/19 1229)    ED Course  I have reviewed the triage vital  signs and the nursing notes.  Pertinent labs & imaging results that were available during my care of the patient were reviewed by me and considered in my medical decision making (see chart for details).    MDM Rules/Calculators/A&P                      39 year old female with a past medical history of hypertension on hydrochlorothiazide and ADHD on Adderall presenting to the ED with a chief complaint of headache since yesterday.  Headache has been responsive to Tylenol that she is taken twice.  She believes her headache is due to restarting her Adderall after 2 months with first dose taken yesterday.  States that this is happened to her in the past.  Also notes increased stressors over the past year and last night.  No neurological deficits noted on my exam.  No meningeal signs.  Patient blood pressure 138/92 during my evaluation.  Will obtain baseline lab work, give migraine cocktail, IV fluids and reassess.  CBC, hCG unremarkable.  BMP with mild hypokalemia of 3.2 which was repleted orally.  Patient with significant improvement in her headache with migraine cocktail and IV fluids.  I feel the headache is most likely related to recent stressors and may be due to her restarting her Adderall.  Will defer to PCP for any medication adjustments. There are no headache characteristics that are lateralizing or concerning for increased ICP, infectious or vascular cause of her symptoms.  We will have her follow-up with PCP and return for worsening symptoms.  Patient is hemodynamically stable, in NAD, and able to ambulate in the ED. Evaluation does not show pathology that would require ongoing emergent intervention or inpatient treatment. I explained the diagnosis to the patient. Pain has been managed and has no complaints prior to discharge. Patient is comfortable with above plan and is stable for discharge at this  time. All questions were answered prior to disposition. Strict return precautions for returning to  the ED were discussed. Encouraged follow up with PCP.   An After Visit Summary was printed and given to the patient.   Portions of this note were generated with Scientist, clinical (histocompatibility and immunogenetics). Dictation errors may occur despite best attempts at proofreading.  Final Clinical Impression(s) / ED Diagnoses Final diagnoses:  Hypokalemia  Acute non intractable tension-type headache    Rx / DC Orders ED Discharge Orders         Ordered    potassium chloride (KLOR-CON) 10 MEQ tablet  Daily     11/25/19 1344           Dietrich Pates, PA-C 11/25/19 1349    Lorre Nick, MD 11/28/19 1528

## 2019-11-25 NOTE — Discharge Instructions (Signed)
Take the potassium by mouth as prescribed. Have your primary care provider recheck your potassium in 1 week. If you would like to change or adjust any of your medications, please speak to your primary care provider. Return to the ED if you start to have worsening headache, blurry vision, numbness in arms or legs, injuries or falls.

## 2019-12-13 ENCOUNTER — Telehealth: Payer: Self-pay

## 2019-12-13 NOTE — Telephone Encounter (Signed)
I called patient to schedule her a ER f/u I left her a v/m to call the office [facility]

## 2020-03-17 ENCOUNTER — Emergency Department (HOSPITAL_COMMUNITY): Payer: No Typology Code available for payment source

## 2020-03-17 ENCOUNTER — Other Ambulatory Visit: Payer: Self-pay

## 2020-03-17 ENCOUNTER — Encounter (HOSPITAL_COMMUNITY): Payer: Self-pay | Admitting: Emergency Medicine

## 2020-03-17 ENCOUNTER — Emergency Department (HOSPITAL_COMMUNITY)
Admission: EM | Admit: 2020-03-17 | Discharge: 2020-03-17 | Disposition: A | Discharge status: Home / Self Care | Payer: No Typology Code available for payment source | Attending: Emergency Medicine | Admitting: Emergency Medicine

## 2020-03-17 DIAGNOSIS — M7918 Myalgia, other site: Secondary | ICD-10-CM | POA: Insufficient documentation

## 2020-03-17 DIAGNOSIS — W2219XA Striking against or struck by other automobile airbag, initial encounter: Secondary | ICD-10-CM | POA: Diagnosis not present

## 2020-03-17 DIAGNOSIS — R0789 Other chest pain: Secondary | ICD-10-CM | POA: Diagnosis present

## 2020-03-17 DIAGNOSIS — Z91040 Latex allergy status: Secondary | ICD-10-CM | POA: Diagnosis not present

## 2020-03-17 DIAGNOSIS — I10 Essential (primary) hypertension: Secondary | ICD-10-CM | POA: Diagnosis not present

## 2020-03-17 DIAGNOSIS — Y9389 Activity, other specified: Secondary | ICD-10-CM | POA: Diagnosis not present

## 2020-03-17 DIAGNOSIS — Y92410 Unspecified street and highway as the place of occurrence of the external cause: Secondary | ICD-10-CM | POA: Diagnosis not present

## 2020-03-17 DIAGNOSIS — Z79899 Other long term (current) drug therapy: Secondary | ICD-10-CM | POA: Insufficient documentation

## 2020-03-17 DIAGNOSIS — Y998 Other external cause status: Secondary | ICD-10-CM | POA: Diagnosis not present

## 2020-03-17 MED ORDER — ONDANSETRON 4 MG PO TBDP
4.0000 mg | ORAL_TABLET | Freq: Once | ORAL | Status: AC
  Administered 2020-03-17: 4 mg via ORAL
  Filled 2020-03-17: qty 1

## 2020-03-17 MED ORDER — METHOCARBAMOL 500 MG PO TABS: 500.0000 mg | ORAL_TABLET | Freq: Two times a day (BID) | ORAL | 0 refills | Status: DC

## 2020-03-17 MED ORDER — METHOCARBAMOL 500 MG PO TABS
500.0000 mg | ORAL_TABLET | Freq: Once | ORAL | Status: AC
  Administered 2020-03-17: 500 mg via ORAL
  Filled 2020-03-17: qty 1

## 2020-03-17 MED ORDER — KETOROLAC TROMETHAMINE 30 MG/ML IJ SOLN
30.0000 mg | Freq: Once | INTRAMUSCULAR | Status: AC
  Administered 2020-03-17: 30 mg via INTRAMUSCULAR
  Filled 2020-03-17: qty 1

## 2020-03-17 MED ORDER — NAPROXEN 500 MG PO TABS: 500.0000 mg | ORAL_TABLET | Freq: Two times a day (BID) | ORAL | 0 refills | Status: DC

## 2020-03-17 NOTE — ED Notes (Signed)
Pallas Wahlert husband [phone] looking for update on pt
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
## 2020-03-17 NOTE — ED Notes (Signed)
Patient verbalizes understanding of discharge instructions. Opportunity for questioning and answers were provided. Armband removed by staff, pt discharged from ED to home via POV with husband 
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
## 2020-03-20 ENCOUNTER — Emergency Department (HOSPITAL_COMMUNITY): Payer: No Typology Code available for payment source

## 2020-03-20 ENCOUNTER — Other Ambulatory Visit: Payer: Self-pay

## 2020-03-20 ENCOUNTER — Encounter (HOSPITAL_COMMUNITY): Payer: Self-pay | Admitting: Emergency Medicine

## 2020-03-20 ENCOUNTER — Emergency Department (HOSPITAL_COMMUNITY)
Admission: EM | Admit: 2020-03-20 | Discharge: 2020-03-20 | Disposition: A | Discharge status: Home / Self Care | Payer: No Typology Code available for payment source | Attending: Emergency Medicine | Admitting: Emergency Medicine

## 2020-03-20 DIAGNOSIS — Z8632 Personal history of gestational diabetes: Secondary | ICD-10-CM | POA: Diagnosis not present

## 2020-03-20 DIAGNOSIS — N3001 Acute cystitis with hematuria: Secondary | ICD-10-CM | POA: Diagnosis not present

## 2020-03-20 DIAGNOSIS — R42 Dizziness and giddiness: Secondary | ICD-10-CM | POA: Diagnosis present

## 2020-03-20 DIAGNOSIS — Z79899 Other long term (current) drug therapy: Secondary | ICD-10-CM | POA: Diagnosis not present

## 2020-03-20 DIAGNOSIS — I10 Essential (primary) hypertension: Secondary | ICD-10-CM | POA: Insufficient documentation

## 2020-03-20 LAB — COMPREHENSIVE METABOLIC PANEL
ALT: 13 U/L (ref 0–44)
AST: 14 U/L — ABNORMAL LOW (ref 15–41)
Albumin: 3.3 g/dL — ABNORMAL LOW (ref 3.5–5.0)
Alkaline Phosphatase: 69 U/L (ref 38–126)
Anion gap: 7 (ref 5–15)
BUN: 13 mg/dL (ref 6–20)
CO2: 26 mmol/L (ref 22–32)
Calcium: 8.9 mg/dL (ref 8.9–10.3)
Chloride: 108 mmol/L (ref 98–111)
Creatinine, Ser: 0.71 mg/dL (ref 0.44–1.00)
GFR calc Af Amer: >60 mL/min (ref >60)
GFR calc non Af Amer: >60 mL/min (ref >60)
Glucose, Bld: 128 mg/dL — ABNORMAL HIGH (ref 70–99)
Potassium: 3.8 mmol/L (ref 3.5–5.1)
Sodium: 141 mmol/L (ref 135–145)
Total Bilirubin: 0.4 mg/dL (ref 0.3–1.2)
Total Protein: 7 g/dL (ref 6.5–8.1)

## 2020-03-20 LAB — CBC
HCT: 34.9 % — ABNORMAL LOW (ref 36.0–46.0)
Hemoglobin: 11.1 g/dL — ABNORMAL LOW (ref 12.0–15.0)
MCH: 27.8 pg (ref 26.0–34.0)
MCHC: 31.8 g/dL (ref 30.0–36.0)
MCV: 87.3 fL (ref 80.0–100.0)
Platelets: 280 10*3/uL (ref 150–400)
RBC: 4 MIL/uL (ref 3.87–5.11)
RDW: 14.7 % (ref 11.5–15.5)
WBC: 7.1 10*3/uL (ref 4.0–10.5)
nRBC: 0 % (ref 0.0–0.2)

## 2020-03-20 LAB — LIPASE, BLOOD: Lipase: 21 U/L (ref 11–51)

## 2020-03-20 LAB — URINALYSIS, ROUTINE W REFLEX MICROSCOPIC
Bilirubin Urine: NEGATIVE
Glucose, UA: NEGATIVE mg/dL
Ketones, ur: NEGATIVE mg/dL
Leukocytes,Ua: NEGATIVE
Nitrite: NEGATIVE
Protein, ur: NEGATIVE mg/dL
Specific Gravity, Urine: 1.024 (ref 1.005–1.030)
pH: 5 (ref 5.0–8.0)

## 2020-03-20 LAB — I-STAT BETA HCG BLOOD, ED (MC, WL, AP ONLY): I-stat hCG, quantitative: <5 m[IU]/mL (ref <5)

## 2020-03-20 MED ORDER — CEPHALEXIN 500 MG PO CAPS: 500.0000 mg | ORAL_CAPSULE | Freq: Four times a day (QID) | ORAL | 0 refills | Status: DC

## 2020-03-20 MED ORDER — MECLIZINE HCL 25 MG PO TABS
12.5000 mg | ORAL_TABLET | Freq: Once | ORAL | Status: AC
  Administered 2020-03-20: 12.5 mg via ORAL
  Filled 2020-03-20: qty 1

## 2020-03-20 MED ORDER — CEPHALEXIN 250 MG PO CAPS
500.0000 mg | ORAL_CAPSULE | Freq: Once | ORAL | Status: AC
  Administered 2020-03-20: 500 mg via ORAL
  Filled 2020-03-20: qty 2

## 2020-03-20 MED ORDER — KETOROLAC TROMETHAMINE 15 MG/ML IJ SOLN
15.0000 mg | Freq: Once | INTRAMUSCULAR | Status: AC
  Administered 2020-03-20: 15 mg via INTRAMUSCULAR
  Filled 2020-03-20: qty 1

## 2020-03-20 MED ORDER — SODIUM CHLORIDE 0.9% FLUSH: 3.0000 mL | Freq: Once | INTRAVENOUS | Status: DC

## 2020-03-20 MED ORDER — SODIUM CHLORIDE 0.9 % IV BOLUS
1000.0000 mL | Freq: Once | INTRAVENOUS | Status: AC
  Administered 2020-03-20: 1000 mL via INTRAVENOUS

## 2020-03-20 MED ORDER — ONDANSETRON HCL 4 MG/2ML IJ SOLN
4.0000 mg | Freq: Once | INTRAMUSCULAR | Status: AC
  Administered 2020-03-20: 4 mg via INTRAVENOUS
  Filled 2020-03-20: qty 2

## 2020-03-20 MED ORDER — ONDANSETRON HCL 4 MG PO TABS
4.0000 mg | ORAL_TABLET | Freq: Three times a day (TID) | ORAL | 0 refills | Status: DC | PRN
Start: 2020-03-20 — End: 2020-04-10

## 2020-03-20 MED ORDER — SODIUM CHLORIDE 0.9 % IV SOLN
1.0000 g | Freq: Once | INTRAVENOUS | Status: AC
  Administered 2020-03-20: 1 g via INTRAVENOUS
  Filled 2020-03-20: qty 10

## 2020-03-20 NOTE — ED Notes (Signed)
IV Blown Medication paused
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
## 2020-03-20 NOTE — Discharge Instructions (Signed)
Take the antibiotics as prescribed.  Drink plenty of fluids.  Return for new or worsening symptoms
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
## 2020-03-20 NOTE — ED Notes (Signed)
Still out at CT 

## 2020-03-21 LAB — URINE CULTURE: Culture: <10000 — AB

## 2020-03-26 ENCOUNTER — Encounter: Payer: BLUE CROSS/BLUE SHIELD | Admitting: Nurse Practitioner

## 2020-04-10 ENCOUNTER — Encounter (HOSPITAL_COMMUNITY): Payer: Self-pay

## 2020-04-10 ENCOUNTER — Other Ambulatory Visit: Payer: Self-pay

## 2020-04-10 ENCOUNTER — Emergency Department (HOSPITAL_COMMUNITY): Payer: Self-pay

## 2020-04-10 ENCOUNTER — Emergency Department (HOSPITAL_COMMUNITY)
Admission: EM | Admit: 2020-04-10 | Discharge: 2020-04-10 | Disposition: A | Discharge status: Home / Self Care | Payer: Self-pay | Attending: Emergency Medicine | Admitting: Emergency Medicine

## 2020-04-10 DIAGNOSIS — Z79899 Other long term (current) drug therapy: Secondary | ICD-10-CM | POA: Insufficient documentation

## 2020-04-10 DIAGNOSIS — M4802 Spinal stenosis, cervical region: Secondary | ICD-10-CM | POA: Insufficient documentation

## 2020-04-10 DIAGNOSIS — E119 Type 2 diabetes mellitus without complications: Secondary | ICD-10-CM | POA: Insufficient documentation

## 2020-04-10 DIAGNOSIS — I10 Essential (primary) hypertension: Secondary | ICD-10-CM | POA: Insufficient documentation

## 2020-04-10 DIAGNOSIS — Z87891 Personal history of nicotine dependence: Secondary | ICD-10-CM | POA: Insufficient documentation

## 2020-04-10 DIAGNOSIS — Z91040 Latex allergy status: Secondary | ICD-10-CM | POA: Insufficient documentation

## 2020-04-10 LAB — CBC
HCT: 40.2 % (ref 36.0–46.0)
Hemoglobin: 12.9 g/dL (ref 12.0–15.0)
MCH: 27.3 pg (ref 26.0–34.0)
MCHC: 32.1 g/dL (ref 30.0–36.0)
MCV: 85.2 fL (ref 80.0–100.0)
Platelets: 316 10*3/uL (ref 150–400)
RBC: 4.72 MIL/uL (ref 3.87–5.11)
RDW: 14.4 % (ref 11.5–15.5)
WBC: 6 10*3/uL (ref 4.0–10.5)
nRBC: 0 % (ref 0.0–0.2)

## 2020-04-10 LAB — BASIC METABOLIC PANEL
Anion gap: 11 (ref 5–15)
BUN: 9 mg/dL (ref 6–20)
CO2: 26 mmol/L (ref 22–32)
Calcium: 9.3 mg/dL (ref 8.9–10.3)
Chloride: 101 mmol/L (ref 98–111)
Creatinine, Ser: 0.68 mg/dL (ref 0.44–1.00)
GFR calc Af Amer: >60 mL/min (ref >60)
GFR calc non Af Amer: >60 mL/min (ref >60)
Glucose, Bld: 82 mg/dL (ref 70–99)
Potassium: 3.8 mmol/L (ref 3.5–5.1)
Sodium: 138 mmol/L (ref 135–145)

## 2020-04-10 MED ORDER — KETOROLAC TROMETHAMINE 60 MG/2ML IM SOLN
60.0000 mg | Freq: Once | INTRAMUSCULAR | Status: AC
  Administered 2020-04-10: 60 mg via INTRAMUSCULAR
  Filled 2020-04-10: qty 2

## 2020-04-10 MED ORDER — ONDANSETRON HCL 8 MG PO TABS
8.0000 mg | ORAL_TABLET | Freq: Three times a day (TID) | ORAL | 0 refills | Status: DC | PRN
Start: 2020-04-10 — End: 2020-06-12

## 2020-04-10 MED ORDER — GADOBUTROL 1 MMOL/ML IV SOLN
10.0000 mL | Freq: Once | INTRAVENOUS | Status: AC | PRN
  Administered 2020-04-10: 10 mL via INTRAVENOUS

## 2020-04-10 MED ORDER — PREDNISONE 20 MG PO TABS
20.0000 mg | ORAL_TABLET | Freq: Two times a day (BID) | ORAL | 0 refills | Status: DC
Start: 2020-04-10 — End: 2020-06-12

## 2020-04-10 MED ORDER — HYDROCODONE-ACETAMINOPHEN 5-325 MG PO TABS: 1.0000 | ORAL_TABLET | ORAL | 0 refills | Status: DC | PRN

## 2020-04-10 MED ORDER — IBUPROFEN 800 MG PO TABS
800.0000 mg | ORAL_TABLET | Freq: Three times a day (TID) | ORAL | 0 refills | Status: DC
Start: 2020-04-10 — End: 2020-07-10
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
## 2020-05-03 ENCOUNTER — Encounter: Payer: Self-pay | Admitting: Nurse Practitioner

## 2020-05-22 ENCOUNTER — Encounter: Payer: Self-pay | Admitting: Family Medicine

## 2020-05-22 ENCOUNTER — Ambulatory Visit (INDEPENDENT_AMBULATORY_CARE_PROVIDER_SITE_OTHER): Payer: BLUE CROSS/BLUE SHIELD | Admitting: Family Medicine

## 2020-05-22 ENCOUNTER — Other Ambulatory Visit: Payer: Self-pay

## 2020-05-22 DIAGNOSIS — M25512 Pain in left shoulder: Secondary | ICD-10-CM | POA: Diagnosis not present

## 2020-05-22 DIAGNOSIS — M542 Cervicalgia: Secondary | ICD-10-CM

## 2020-05-22 DIAGNOSIS — G8929 Other chronic pain: Secondary | ICD-10-CM

## 2020-05-22 MED ORDER — MELOXICAM 15 MG PO TABS: 7.5000 mg | ORAL_TABLET | Freq: Every day | ORAL | 6 refills | Status: DC | PRN

## 2020-05-22 MED ORDER — TIZANIDINE HCL 2 MG PO TABS: 2.0000 mg | ORAL_TABLET | Freq: Four times a day (QID) | ORAL | 1 refills | Status: DC | PRN
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
## 2020-05-23 ENCOUNTER — Encounter: Payer: Self-pay | Admitting: Family Medicine

## 2020-05-23 DIAGNOSIS — M25512 Pain in left shoulder: Secondary | ICD-10-CM

## 2020-05-23 DIAGNOSIS — G8929 Other chronic pain: Secondary | ICD-10-CM

## 2020-05-25 ENCOUNTER — Other Ambulatory Visit: Payer: Self-pay

## 2020-05-25 ENCOUNTER — Emergency Department (HOSPITAL_COMMUNITY)
Admission: EM | Admit: 2020-05-25 | Discharge: 2020-05-25 | Disposition: A | Discharge status: Home / Self Care | Payer: BLUE CROSS/BLUE SHIELD | Attending: Emergency Medicine | Admitting: Emergency Medicine

## 2020-05-25 ENCOUNTER — Ambulatory Visit (HOSPITAL_COMMUNITY): Admission: RE | Admit: 2020-05-25 | Payer: BLUE CROSS/BLUE SHIELD | Source: Ambulatory Visit

## 2020-05-25 ENCOUNTER — Encounter (HOSPITAL_COMMUNITY): Payer: Self-pay

## 2020-05-25 DIAGNOSIS — Z5321 Procedure and treatment not carried out due to patient leaving prior to being seen by health care provider: Secondary | ICD-10-CM | POA: Insufficient documentation

## 2020-05-25 DIAGNOSIS — Y999 Unspecified external cause status: Secondary | ICD-10-CM | POA: Insufficient documentation

## 2020-05-25 DIAGNOSIS — Y939 Activity, unspecified: Secondary | ICD-10-CM | POA: Insufficient documentation

## 2020-05-25 DIAGNOSIS — Y92410 Unspecified street and highway as the place of occurrence of the external cause: Secondary | ICD-10-CM | POA: Diagnosis not present

## 2020-05-25 DIAGNOSIS — M25512 Pain in left shoulder: Secondary | ICD-10-CM

## 2020-05-25 NOTE — ED Notes (Signed)
Pt eloped from waiting area. Called 3X for room placement and no answer.
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
## 2020-05-29 NOTE — Addendum Note (Signed)
Addended by: Lillia Carmel on: 05/29/2020 12:11 PM   Modules accepted: Orders

## 2020-05-30 MED ORDER — HYDROCODONE-ACETAMINOPHEN 5-325 MG PO TABS: 1.0000 | ORAL_TABLET | Freq: Every evening | ORAL | 0 refills | Status: DC | PRN

## 2020-05-30 NOTE — Addendum Note (Signed)
Addended by: Lillia Carmel on: 05/30/2020 02:42 PM   Modules accepted: Orders

## 2020-06-08 ENCOUNTER — Encounter: Payer: Self-pay | Admitting: Family Medicine

## 2020-06-11 ENCOUNTER — Encounter: Payer: Self-pay | Admitting: Family Medicine

## 2020-06-11 DIAGNOSIS — M545 Low back pain, unspecified: Secondary | ICD-10-CM

## 2020-06-11 DIAGNOSIS — G8929 Other chronic pain: Secondary | ICD-10-CM

## 2020-06-12 ENCOUNTER — Ambulatory Visit: Payer: Self-pay

## 2020-06-12 ENCOUNTER — Other Ambulatory Visit: Payer: Self-pay

## 2020-06-12 ENCOUNTER — Ambulatory Visit (INDEPENDENT_AMBULATORY_CARE_PROVIDER_SITE_OTHER): Payer: Self-pay | Admitting: Family Medicine

## 2020-06-12 ENCOUNTER — Encounter: Payer: Self-pay | Admitting: Family Medicine

## 2020-06-12 DIAGNOSIS — G8929 Other chronic pain: Secondary | ICD-10-CM

## 2020-06-12 DIAGNOSIS — M25512 Pain in left shoulder: Secondary | ICD-10-CM

## 2020-06-12 NOTE — Progress Notes (Signed)
   Office Visit Note   Patient: Deborah Jacobson           Date of Birth: Jun 07, 1981           MRN: [id number] Visit Date: 06/12/2020 Requested by: Arnette Felts, FNP [address] [place],  [place] [address] PCP: Arnette Felts, FNP  Subjective: Chief Complaint  Patient presents with  . Left Shoulder - Pain, Follow-up    Glenohumeral cortisone injection    HPI: She is here for follow-up left shoulder pain. Recent MRI scan showed a SLAP tear. Rotator cuff is intact. She is still having pain, would very much like to avoid surgery if possible.              ROS:   All other systems were reviewed and are negative.  Objective: Vital Signs: There were no vitals taken for this visit.  Physical Exam:  General:  Alert and oriented, in no acute distress. Pulm:  Breathing unlabored. Psy:  Normal mood, congruent affect.    Imaging: US Guided Needle Placement - No Linked Charges  Result Date: 06/12/2020 Ultrasound-guided left glenohumeral injection: After sterile prep with Betadine, injected 8 cc 1% lidocaine without epinephrine and 40 mg methylprednisolone using a 22-gauge spinal needle, passing the needle from posterior approach into the glenohumeral joint.  Injectate seen filling joint capsule.     Assessment & Plan: 1. Left shoulder SLAP tear -We elected to proceed with a glenohumeral injection today. She will let me know how she does.         PMFS History: Patient Active Problem List   Diagnosis Date Noted  . Health maintenance examination 03/23/2019  . Prediabetes 14-Dec-2018  . Strain of back 12-14-2018  . Death of family member 12/14/18  . Back pain 07/31/2018  . Fatigue 05/31/2018  . Chronic low back pain 11/30/2017  . Shifting sleep-work schedule 06/15/2017  . 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 Sleep related headaches 06/15/2017  . Super obese 06/15/2017  . Mood complaints in sleep disorder 06/15/2017  . Plantar fasciitis of left foot 03/01/2014  .  Porokeratosis 03/01/2014  . Pain in lower limb 03/01/2014   Past Medical History:  Diagnosis Date  . Acid reflux   . Diabetes in pregnancy   . Hypertension   . Seasonal allergies     Family History  Problem Relation Age of Onset  . Diabetes Mother   . Liver disease Mother   . Hypertension Mother     Past Surgical History:  Procedure Laterality Date  . CESAREAN SECTION     Social History   Occupational History  . Not on file  Tobacco Use  . Smoking status: Former Games developer  . Smokeless tobacco: Never Used  Vaping Use  . Vaping Use: Never used  Substance and Sexual Activity  . Alcohol use: No  . Drug use: No  . Sexual activity: Yes    Birth control/protection: None

## 2020-06-18 ENCOUNTER — Ambulatory Visit: Payer: BLUE CROSS/BLUE SHIELD | Admitting: Family Medicine

## 2020-06-21 ENCOUNTER — Other Ambulatory Visit: Payer: 59

## 2020-06-22 MED ORDER — BACLOFEN 10 MG PO TABS: 5.0000 mg | ORAL_TABLET | Freq: Three times a day (TID) | ORAL | 3 refills | Status: DC | PRN

## 2020-06-22 MED ORDER — HYDROCODONE-ACETAMINOPHEN 5-325 MG PO TABS: 1.0000 | ORAL_TABLET | Freq: Every evening | ORAL | 0 refills | Status: DC | PRN

## 2020-06-22 NOTE — Addendum Note (Signed)
Addended by: Lillia Carmel on: 06/22/2020 11:31 AM   Modules accepted: Orders

## 2020-06-22 NOTE — Addendum Note (Signed)
Addended by: Lillia Carmel on: 06/22/2020 01:01 PM   Modules accepted: Orders

## 2020-06-22 NOTE — Addendum Note (Signed)
Addended by: Lillia Carmel on: 06/22/2020 08:20 AM   Modules accepted: Orders

## 2020-06-26 ENCOUNTER — Telehealth: Payer: Self-pay | Admitting: Orthopaedic Surgery

## 2020-06-26 NOTE — Telephone Encounter (Signed)
Called patient left message to return call to schedule an appointment with Dr Roda Shutters for consult shoulder labrum tear

## 2020-06-28 ENCOUNTER — Telehealth: Payer: Self-pay

## 2020-06-28 NOTE — Telephone Encounter (Signed)
Dan from [facility] called in to notify u that cpt code [id number] has been approved . Also got approved for one unit

## 2020-06-29 ENCOUNTER — Telehealth: Payer: Self-pay | Admitting: Physical Medicine and Rehabilitation

## 2020-06-29 NOTE — Telephone Encounter (Signed)
Documented in referral  

## 2020-06-29 NOTE — Telephone Encounter (Signed)
Patient called requesting to set an appt with Dr. Alvester Morin referred from Dr. Prince Rome. Patient's phone number is [phone].

## 2020-07-04 ENCOUNTER — Other Ambulatory Visit: Payer: Self-pay | Admitting: Podiatry

## 2020-07-04 ENCOUNTER — Encounter: Payer: Self-pay | Admitting: Family Medicine

## 2020-07-04 ENCOUNTER — Ambulatory Visit (INDEPENDENT_AMBULATORY_CARE_PROVIDER_SITE_OTHER): Payer: 59

## 2020-07-04 ENCOUNTER — Ambulatory Visit: Payer: 59 | Admitting: Podiatry

## 2020-07-04 ENCOUNTER — Other Ambulatory Visit: Payer: Self-pay

## 2020-07-04 DIAGNOSIS — M722 Plantar fascial fibromatosis: Secondary | ICD-10-CM

## 2020-07-04 DIAGNOSIS — M779 Enthesopathy, unspecified: Secondary | ICD-10-CM

## 2020-07-04 DIAGNOSIS — M79671 Pain in right foot: Secondary | ICD-10-CM

## 2020-07-04 DIAGNOSIS — M79672 Pain in left foot: Secondary | ICD-10-CM

## 2020-07-04 MED ORDER — DICLOFENAC SODIUM 75 MG PO TBEC: 75.0000 mg | DELAYED_RELEASE_TABLET | Freq: Two times a day (BID) | ORAL | 2 refills | Status: DC

## 2020-07-04 MED ORDER — TRAMADOL HCL 50 MG PO TABS: 50.0000 mg | ORAL_TABLET | Freq: Four times a day (QID) | ORAL | 0 refills | Status: DC | PRN

## 2020-07-04 NOTE — Progress Notes (Signed)
Subjective:   Patient ID: Deborah Jacobson, female   DOB: 39 y.o.   MRN: [id number]   HPI Patient states she has been on her foot quite a bit recently and is getting a lot of pain in her heels of both feet.  States also the forefoot can bother her but not to the same degree   ROS      Objective:  Physical Exam  Neurovascular status intact with patient found to have depression of the arch bilateral with inflammation fluid of the medial fascial band left over right and moderate forefoot and lateral foot discomfort right over left     Assessment:  Acute plantar fasciitis bilateral with moderate forefoot capsulitis     Plan:  H&P reviewed condition sterile prep done and injected the fascia bilateral 3 mg Kenalog 5 mg Xylocaine and advised on supportive therapy stretching exercises topical medications and I placed on oral diclofenac.  Reappoint to recheck as symptoms indicate  X-rays indicate that there is no signs of fracture there is quite a bit of flatfoot deformity with spur formation

## 2020-07-04 NOTE — Addendum Note (Signed)
Addended by: Lillia Carmel on: 07/04/2020 11:22 AM   Modules accepted: Orders

## 2020-07-09 ENCOUNTER — Encounter: Payer: Self-pay | Admitting: Physical Medicine and Rehabilitation

## 2020-07-09 ENCOUNTER — Other Ambulatory Visit: Payer: Self-pay

## 2020-07-09 ENCOUNTER — Ambulatory Visit: Payer: Self-pay

## 2020-07-09 ENCOUNTER — Ambulatory Visit (INDEPENDENT_AMBULATORY_CARE_PROVIDER_SITE_OTHER): Payer: 59 | Admitting: Physical Medicine and Rehabilitation

## 2020-07-09 VITALS — BP 123/85 | HR 74

## 2020-07-09 DIAGNOSIS — M542 Cervicalgia: Secondary | ICD-10-CM

## 2020-07-09 DIAGNOSIS — M5416 Radiculopathy, lumbar region: Secondary | ICD-10-CM | POA: Diagnosis not present

## 2020-07-09 MED ORDER — METHYLPREDNISOLONE ACETATE 80 MG/ML IJ SUSP
80.0000 mg | Freq: Once | INTRAMUSCULAR | Status: DC
Start: 2020-07-09 — End: 2020-07-31

## 2020-07-09 NOTE — Progress Notes (Signed)
Pt states lower back and neck pain. Pt state when she feels cold or sitting for a long time makes her pain worse. Pt states heating pad and cream for pain helps ease sum of the pain.  Numeric Pain Rating Scale and Functional Assessment Average Pain 8   In the last MONTH (on 0-10 scale) has pain interfered with the following?  1. General activity like being  able to carry out your everyday physical activities such as walking, climbing stairs, carrying groceries, or moving a chair?  Rating(10)   +Driver, -BT, -Dye Allergies.

## 2020-07-10 ENCOUNTER — Encounter: Payer: Self-pay | Admitting: Nurse Practitioner

## 2020-07-10 ENCOUNTER — Ambulatory Visit (INDEPENDENT_AMBULATORY_CARE_PROVIDER_SITE_OTHER): Payer: 59 | Admitting: Nurse Practitioner

## 2020-07-10 ENCOUNTER — Encounter: Payer: Self-pay | Admitting: Orthopaedic Surgery

## 2020-07-10 ENCOUNTER — Ambulatory Visit (INDEPENDENT_AMBULATORY_CARE_PROVIDER_SITE_OTHER): Payer: 59 | Admitting: Orthopaedic Surgery

## 2020-07-10 VITALS — Ht <= 58 in | Wt 209.0 lb

## 2020-07-10 VITALS — BP 132/88 | HR 77 | Temp 98.3°F | Ht <= 58 in | Wt 208.0 lb

## 2020-07-10 DIAGNOSIS — R7303 Prediabetes: Secondary | ICD-10-CM

## 2020-07-10 DIAGNOSIS — M25512 Pain in left shoulder: Secondary | ICD-10-CM | POA: Diagnosis not present

## 2020-07-10 DIAGNOSIS — G8929 Other chronic pain: Secondary | ICD-10-CM

## 2020-07-10 DIAGNOSIS — Z6841 Body Mass Index (BMI) 40.0 and over, adult: Secondary | ICD-10-CM

## 2020-07-10 DIAGNOSIS — S43432A Superior glenoid labrum lesion of left shoulder, initial encounter: Secondary | ICD-10-CM | POA: Insufficient documentation

## 2020-07-10 DIAGNOSIS — Z1159 Encounter for screening for other viral diseases: Secondary | ICD-10-CM | POA: Diagnosis not present

## 2020-07-10 MED ORDER — DICLOFENAC SODIUM 1 % EX GEL: 2.0000 g | Freq: Four times a day (QID) | CUTANEOUS | 2 refills | Status: DC

## 2020-07-10 MED ORDER — OZEMPIC (0.25 OR 0.5 MG/DOSE) 2 MG/1.5ML ~~LOC~~ SOPN: 0.5000 mg | PEN_INJECTOR | SUBCUTANEOUS | 1 refills | Status: DC

## 2020-07-10 MED ORDER — HYDROCHLOROTHIAZIDE 12.5 MG PO TABS: 12.5000 mg | ORAL_TABLET | Freq: Every day | ORAL | 1 refills | Status: DC
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
## 2020-07-10 NOTE — Patient Instructions (Signed)
   If your insurance does not cover diclofenac gel take the over the counter voltaren gel.
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
## 2020-07-13 ENCOUNTER — Other Ambulatory Visit: Payer: Self-pay

## 2020-07-13 ENCOUNTER — Ambulatory Visit (INDEPENDENT_AMBULATORY_CARE_PROVIDER_SITE_OTHER): Payer: 59 | Admitting: Family Medicine

## 2020-07-13 ENCOUNTER — Encounter: Payer: Self-pay | Admitting: Family Medicine

## 2020-07-13 DIAGNOSIS — M545 Low back pain, unspecified: Secondary | ICD-10-CM

## 2020-07-13 DIAGNOSIS — M25512 Pain in left shoulder: Secondary | ICD-10-CM | POA: Diagnosis not present

## 2020-07-13 DIAGNOSIS — G8929 Other chronic pain: Secondary | ICD-10-CM
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
## 2020-07-19 NOTE — Progress Notes (Signed)
I have called [facility] in [place] and spoke with Kayla: she will mail the cd + report in the morning to [place] (mail has already gone out today).

## 2020-07-31 ENCOUNTER — Encounter: Payer: Self-pay | Admitting: Physical Medicine and Rehabilitation
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
## 2020-08-02 ENCOUNTER — Ambulatory Visit (INDEPENDENT_AMBULATORY_CARE_PROVIDER_SITE_OTHER): Payer: 59 | Admitting: Orthopaedic Surgery

## 2020-08-02 ENCOUNTER — Encounter: Payer: Self-pay | Admitting: Physical Therapy

## 2020-08-02 ENCOUNTER — Other Ambulatory Visit: Payer: Self-pay

## 2020-08-02 ENCOUNTER — Ambulatory Visit (INDEPENDENT_AMBULATORY_CARE_PROVIDER_SITE_OTHER): Payer: 59 | Admitting: Physical Therapy

## 2020-08-02 ENCOUNTER — Encounter: Payer: Self-pay | Admitting: Orthopaedic Surgery

## 2020-08-02 DIAGNOSIS — M545 Low back pain, unspecified: Secondary | ICD-10-CM

## 2020-08-02 DIAGNOSIS — M6281 Muscle weakness (generalized): Secondary | ICD-10-CM | POA: Diagnosis not present

## 2020-08-02 DIAGNOSIS — M25512 Pain in left shoulder: Secondary | ICD-10-CM

## 2020-08-02 DIAGNOSIS — M25612 Stiffness of left shoulder, not elsewhere classified: Secondary | ICD-10-CM | POA: Diagnosis not present

## 2020-08-02 DIAGNOSIS — S43432A Superior glenoid labrum lesion of left shoulder, initial encounter: Secondary | ICD-10-CM | POA: Diagnosis not present

## 2020-08-02 NOTE — Progress Notes (Signed)
Office Visit Note   Patient: Deborah Jacobson           Date of Birth: [birth year]           MRN: [id number] Visit Date: 08/02/2020              Requested by: Arnette Felts, FNP [address] [place],  [place] [address] PCP: Arnette Felts, FNP   Assessment & Plan: Visit Diagnoses:  1. Superior glenoid labrum lesion of left shoulder, initial encounter     Plan: Impression is left SLAP tear. MRI findings reviewed with the patient in detail. Patient would like to avoid surgery if possible. She has an appointment for physical therapy today. We will see how this goes over the next 6 weeks or so. She will also think about her options that include definitive treatment in terms of surgery. All of this was also discussed over the phone with her husband. Total face to face encounter time was greater than 25 minutes and over half of this time was spent in counseling and/or coordination of care.  Follow-Up Instructions: Return if symptoms worsen or fail to improve.   Orders:  No orders of the defined types were placed in this encounter.  No orders of the defined types were placed in this encounter.     Procedures: No procedures performed   Clinical Data: No additional findings.   Subjective: Chief Complaint  Patient presents with  . Left Shoulder - Pain    Deborah Jacobson returns today for follow-up of her left shoulder SLAP tear. She is here to review the MRI. 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 Bra strap also hurts her left shoulder.   Review of Systems   Objective: Vital Signs: There were no vitals taken for this visit.  Physical Exam  Ortho Exam Left shoulder shows a mildly positive crank test. Positive O'Brien. Specialty Comments:  No specialty comments available.  Imaging: No results found.   PMFS History: Patient Active Problem List   Diagnosis Date Noted  . Superior glenoid labrum lesion of  left shoulder 07/10/2020  . Health maintenance examination 03/23/2019  . Prediabetes 03-Dec-2018  . Strain of back 12/03/2018  . Death of family member 12/03/2018  . Back pain 07/31/2018  . Fatigue 05/31/2018  . Chronic low back pain 11/30/2017  . Shifting sleep-work schedule 06/15/2017  . 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 Sleep related headaches 06/15/2017  . Super obese 06/15/2017  . Mood complaints in sleep disorder 06/15/2017  . Plantar fasciitis of left foot 03/01/2014  . Porokeratosis 03/01/2014  . Pain in lower limb 03/01/2014   Past Medical History:  Diagnosis Date  . Acid reflux   . Diabetes in pregnancy   . Hypertension   . Seasonal allergies     Family History  Problem Relation Age of Onset  . Diabetes Mother   . Liver disease Mother   . Hypertension Mother     Past Surgical History:  Procedure Laterality Date  . CESAREAN SECTION     Social History   Occupational History  . Not on file  Tobacco Use  . Smoking status: Former Games developer  . Smokeless tobacco: Never Used  Vaping Use  . Vaping Use: Never used  Substance and Sexual Activity  . Alcohol use: No  . Drug use: No  . Sexual activity: Yes    Birth control/protection: None
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
## 2020-08-02 NOTE — Patient Instructions (Signed)
Access Code: [email or web address] URL: [email or web address] Date: 08/02/2020 Prepared by: Roderic Scarce  Exercises Supine Posterior Pelvic Tilt - 2 x daily - 10-15 reps Lower Trunk Rotations - 2 x daily - 10-15 reps Shoulder External Rotation and Scapular Retraction - 2 x daily - 10-15 reps Standing Backward Shoulder Rolls - 2 x daily - 10-15 reps Standing Cervical Retraction - 2 x daily - 10-15 reps Circular Shoulder Pendulum with Table Support - 2 x daily - 10-15 reps  Patient Education TENS Unit

## 2020-08-09 ENCOUNTER — Encounter: Payer: Self-pay | Admitting: Nurse Practitioner

## 2020-08-09 ENCOUNTER — Encounter: Payer: Self-pay | Admitting: Orthopaedic Surgery

## 2020-08-09 ENCOUNTER — Encounter: Payer: Self-pay | Admitting: Family Medicine

## 2020-08-09 ENCOUNTER — Other Ambulatory Visit: Payer: Self-pay

## 2020-08-09 ENCOUNTER — Ambulatory Visit (INDEPENDENT_AMBULATORY_CARE_PROVIDER_SITE_OTHER): Payer: 59 | Admitting: Nurse Practitioner

## 2020-08-09 VITALS — BP 118/84 | HR 79 | Temp 98.1°F | Ht <= 58 in | Wt 203.0 lb

## 2020-08-09 DIAGNOSIS — Z20822 Contact with and (suspected) exposure to covid-19: Secondary | ICD-10-CM

## 2020-08-09 DIAGNOSIS — Z6841 Body Mass Index (BMI) 40.0 and over, adult: Secondary | ICD-10-CM | POA: Insufficient documentation

## 2020-08-09 DIAGNOSIS — F419 Anxiety disorder, unspecified: Secondary | ICD-10-CM

## 2020-08-09 DIAGNOSIS — R1030 Lower abdominal pain, unspecified: Secondary | ICD-10-CM | POA: Diagnosis not present

## 2020-08-09 DIAGNOSIS — R3129 Other microscopic hematuria: Secondary | ICD-10-CM

## 2020-08-09 DIAGNOSIS — R3911 Hesitancy of micturition: Secondary | ICD-10-CM

## 2020-08-09 DIAGNOSIS — E66813 Obesity, class 3: Secondary | ICD-10-CM

## 2020-08-09 LAB — POCT URINALYSIS DIPSTICK
Bilirubin, UA: NEGATIVE
Glucose, UA: NEGATIVE
Ketones, UA: NEGATIVE
Leukocytes, UA: NEGATIVE
Nitrite, UA: NEGATIVE
Protein, UA: NEGATIVE
Spec Grav, UA: >=1.03 — AB (ref 1.010–1.025)
Urobilinogen, UA: 0.2 E.U./dL
pH, UA: 6 (ref 5.0–8.0)

## 2020-08-09 MED ORDER — HYDROXYZINE HCL 25 MG PO TABS: 25.0000 mg | ORAL_TABLET | Freq: Three times a day (TID) | ORAL | 2 refills | Status: DC | PRN
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
## 2020-08-10 ENCOUNTER — Encounter: Payer: Self-pay | Admitting: Nurse Practitioner

## 2020-08-10 LAB — NOVEL CORONAVIRUS, NAA: SARS-CoV-2, NAA: NOT DETECTED

## 2020-08-10 LAB — SARS-COV-2, NAA 2 DAY TAT

## 2020-08-12 ENCOUNTER — Other Ambulatory Visit: Payer: Self-pay | Admitting: Nurse Practitioner

## 2020-08-12 DIAGNOSIS — N390 Urinary tract infection, site not specified: Secondary | ICD-10-CM

## 2020-08-12 LAB — URINE CULTURE

## 2020-08-12 MED ORDER — NITROFURANTOIN MONOHYD MACRO 100 MG PO CAPS: 100.0000 mg | ORAL_CAPSULE | Freq: Two times a day (BID) | ORAL | 0 refills | Status: AC

## 2020-08-13 ENCOUNTER — Other Ambulatory Visit: Payer: Self-pay

## 2020-08-13 ENCOUNTER — Ambulatory Visit (INDEPENDENT_AMBULATORY_CARE_PROVIDER_SITE_OTHER): Payer: 59 | Admitting: Rehabilitative and Restorative Service Providers"

## 2020-08-13 ENCOUNTER — Encounter: Payer: Self-pay | Admitting: Orthopaedic Surgery

## 2020-08-13 ENCOUNTER — Encounter: Payer: Self-pay | Admitting: Rehabilitative and Restorative Service Providers"

## 2020-08-13 DIAGNOSIS — R293 Abnormal posture: Secondary | ICD-10-CM

## 2020-08-13 DIAGNOSIS — M25512 Pain in left shoulder: Secondary | ICD-10-CM

## 2020-08-13 DIAGNOSIS — M25612 Stiffness of left shoulder, not elsewhere classified: Secondary | ICD-10-CM

## 2020-08-13 DIAGNOSIS — G8929 Other chronic pain: Secondary | ICD-10-CM

## 2020-08-13 DIAGNOSIS — M6281 Muscle weakness (generalized): Secondary | ICD-10-CM

## 2020-08-13 DIAGNOSIS — M545 Low back pain, unspecified: Secondary | ICD-10-CM | POA: Diagnosis not present
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
## 2020-08-13 NOTE — Telephone Encounter (Signed)
Please call her when you can.  Thanks.

## 2020-08-13 NOTE — Patient Instructions (Signed)
Access Code: [email or web address] URL: [email or web address] Date: 08/13/2020 Prepared by: Pauletta Browns  Exercises Standing Lumbar Extension at Wall - Forearms - 5 x daily - 7 x weekly - 1 sets - 5 reps - 3 seconds hold  Home walking program  Avoid prolonged sitting

## 2020-08-14 ENCOUNTER — Encounter: Payer: Self-pay | Admitting: Orthopaedic Surgery

## 2020-08-15 ENCOUNTER — Other Ambulatory Visit: Payer: 59

## 2020-08-16 ENCOUNTER — Encounter: Payer: Self-pay | Admitting: Physical Therapy

## 2020-08-16 ENCOUNTER — Ambulatory Visit (INDEPENDENT_AMBULATORY_CARE_PROVIDER_SITE_OTHER): Payer: 59 | Admitting: Physical Therapy

## 2020-08-16 ENCOUNTER — Other Ambulatory Visit: Payer: Self-pay

## 2020-08-16 DIAGNOSIS — M25512 Pain in left shoulder: Secondary | ICD-10-CM

## 2020-08-16 DIAGNOSIS — M25612 Stiffness of left shoulder, not elsewhere classified: Secondary | ICD-10-CM

## 2020-08-16 DIAGNOSIS — G8929 Other chronic pain: Secondary | ICD-10-CM

## 2020-08-16 DIAGNOSIS — R293 Abnormal posture: Secondary | ICD-10-CM | POA: Diagnosis not present

## 2020-08-16 DIAGNOSIS — M6281 Muscle weakness (generalized): Secondary | ICD-10-CM

## 2020-08-16 DIAGNOSIS — M545 Low back pain, unspecified: Secondary | ICD-10-CM

## 2020-08-16 NOTE — Therapy (Signed)
[facility] Physical Therapy [address] [place], [place], [address] Phone: [phone]   Fax:  [phone]  Physical Therapy Treatment  Patient Details  Name: Deborah Jacobson MRN: [id number] Date of Birth: [birth year] Referring Provider (PT): Dr Aram Candela   Encounter Date: 08/16/2020   PT End of Session - 08/16/20 1346    Visit Number 3    Number of Visits 12    Date for PT Re-Evaluation 09/13/20    Authorization Type [employer]    PT Start Time 1346    PT Stop Time 1435    PT Time Calculation (min) 49 min    Activity Tolerance Patient limited by pain    Behavior During Therapy [facility] for tasks assessed/performed           Past Medical History:  Diagnosis Date  . Acid reflux   . Diabetes in pregnancy   . Hypertension   . Seasonal allergies     Past Surgical History:  Procedure Laterality Date  . CESAREAN SECTION      There were no vitals filed for this visit.   Subjective Assessment - 08/16/20 1350    Subjective Pt reports that the exercise and home TENs seem to be helping.  Does still always have some pain in her shoulders. She has started walking 30 min a day.    Patient Stated Goals avoid surgery on her shoulder.    Currently in Pain? Yes    Pain Score 5     Pain Location Shoulder    Pain Orientation Left    Pain Descriptors / Indicators Aching    Pain Type Chronic pain                             [facility] Adult PT Treatment/Exercise - 08/16/20 0001      Exercises   Exercises Other Exercises    Other Exercises  10x5sec PPT,then TA bracing and single leg clam,  10 reps bridge,       Shoulder Exercises: Supine   External Rotation Strengthening;Both;10 reps;Theraband   2 sec holds   External Rotation Limitations small motion then isometric hold     Other Supine Exercises 10 reps shoulder presses - tactile cues to get the movement . 4 reps goal post arms to scarecrow, had to stop d/t severe tigthening anterior Lt shoulder        Shoulder Exercises: Isometric Strengthening   External Rotation 5X5"    Internal Rotation 5X5"      Shoulder Exercises: Stretch   Other Shoulder Stretches low and mid doorway stretch Lt UE       Modalities   Modalities Electrical Stimulation;Moist Heat      Moist Heat Therapy   Number Minutes Moist Heat 10 Minutes    Moist Heat Location Lumbar Spine;Shoulder   lt     Electrical Stimulation   Electrical Stimulation Location Lt shoulder complex    Electrical Stimulation Action IFC     Electrical Stimulation Parameters supine to tolerance    Electrical Stimulation Goals Pain                    PT Short Term Goals - 08/13/20 1401      PT SHORT TERM GOAL #1   Title I with initial HEP    Time 4    Period Weeks    Status On-going    Target Date 08/23/20      PT  SHORT TERM GOAL #2   Title report =/> 50% reduction of overall back pain to allow return to normal lumbar and cervical motion    Time 3    Period Weeks    Status On-going    Target Date 08/23/20      PT SHORT TERM GOAL #3   Title assess Lt shoulder strength and set goals    Time 3    Period Weeks    Status On-going    Target Date 08/23/20             PT Long Term Goals - 08/13/20 1401      PT LONG TERM GOAL #1   Title I with advanced HEP    Time 6    Period Weeks    Status On-going      PT LONG TERM GOAL #2   Title report minimal to no back pain with daily activity    Time 6    Period Weeks    Status On-going      PT LONG TERM GOAL #3   Title report decreased LT shoulder pain to allow her to consistently perform her own bathing and dressing    Time 6    Period Weeks    Status On-going      PT LONG TERM GOAL #4   Title improve quick DASH =/< 40% limited    Time 6    Period Weeks    Status On-going      PT LONG TERM GOAL #5   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  She was able to perform AA flexion on UE rehaber to ~ 120 degrees for 10 reps, ER is very limited.  She was able to perform ther ex alternating between core/back and shoulder today.  di dend with heat and stim as her shoulder flared up with moving between IR/ER    Rehab Potential Good    PT Frequency 2x / week    PT Duration 6 weeks    PT Treatment/Interventions Iontophoresis 4mg /ml Dexamethasone;Taping;Vasopneumatic Device;Patient/family education;Moist Heat;Therapeutic activities;Dry needling;Spinal Manipulations;Passive range of motion;Therapeutic exercise;Cryotherapy;Electrical Stimulation;Neuromuscular re-education;Manual techniques    PT Next Visit Plan Progress low back strength, walking and as much shoulder AROM as she can handle.    Consulted and Agree with Plan of Care Patient           Patient will benefit from skilled therapeutic intervention in order to improve the following deficits and impairments:  Decreased range of motion, Obesity, Impaired UE functional use, Increased muscle spasms, Pain, Hypomobility, Decreased strength  Visit Diagnosis: Chronic pain in left shoulder  Stiffness of left shoulder, not elsewhere classified  Posture abnormality  Muscle weakness (generalized)  Chronic low back pain, unspecified back pain laterality, unspecified whether sciatica present     Problem List Patient Active Problem List   Diagnosis Date Noted  . Morbid obesity with BMI of 45.0-49.9, adult (HCC) 08/09/2020  . Superior glenoid labrum lesion of left shoulder 07/10/2020  . Health maintenance examination 03/23/2019  . Prediabetes December 27, 2018  . Strain of back 12-27-2018  . Death of family member 12/27/18  . Back pain 07/31/2018  .  Fatigue 05/31/2018  . Chronic low back pain 11/30/2017  . Shifting sleep-work schedule 06/15/2017  . Paradoxical insomnia  06/15/2017  . Sleep related headaches 06/15/2017  . Super obese 06/15/2017  . Mood complaints in sleep disorder 06/15/2017  . Plantar fasciitis of left foot 03/01/2014  . Porokeratosis 03/01/2014  . Pain in lower limb 03/01/2014    Roderic Scarce PT  08/16/2020, 2:28 PM  [facility] Physical Therapy [address] [place], [place], [address] Phone: [phone]   Fax:  [phone]  Name: Deborah Jacobson MRN: [id number] Date of Birth: [birth year]

## 2020-08-20 ENCOUNTER — Encounter: Payer: 59 | Admitting: Rehabilitative and Restorative Service Providers"

## 2020-08-21 ENCOUNTER — Ambulatory Visit
Admission: RE | Admit: 2020-08-21 | Discharge: 2020-08-21 | Disposition: A | Discharge status: Home / Self Care | Payer: 59 | Source: Ambulatory Visit | Attending: Nurse Practitioner | Admitting: Nurse Practitioner

## 2020-08-21 ENCOUNTER — Other Ambulatory Visit: Payer: Self-pay

## 2020-08-21 DIAGNOSIS — R3911 Hesitancy of micturition: Secondary | ICD-10-CM

## 2020-08-22 ENCOUNTER — Encounter (HOSPITAL_BASED_OUTPATIENT_CLINIC_OR_DEPARTMENT_OTHER): Payer: Self-pay | Admitting: Orthopaedic Surgery

## 2020-08-22 ENCOUNTER — Encounter: Payer: Self-pay | Admitting: Rehabilitative and Restorative Service Providers"

## 2020-08-22 ENCOUNTER — Other Ambulatory Visit: Payer: Self-pay

## 2020-08-22 ENCOUNTER — Ambulatory Visit (INDEPENDENT_AMBULATORY_CARE_PROVIDER_SITE_OTHER): Payer: 59 | Admitting: Rehabilitative and Restorative Service Providers"

## 2020-08-22 DIAGNOSIS — G8929 Other chronic pain: Secondary | ICD-10-CM

## 2020-08-22 DIAGNOSIS — M25612 Stiffness of left shoulder, not elsewhere classified: Secondary | ICD-10-CM | POA: Diagnosis not present

## 2020-08-22 DIAGNOSIS — R262 Difficulty in walking, not elsewhere classified: Secondary | ICD-10-CM

## 2020-08-22 DIAGNOSIS — R293 Abnormal posture: Secondary | ICD-10-CM | POA: Diagnosis not present

## 2020-08-22 DIAGNOSIS — M6281 Muscle weakness (generalized): Secondary | ICD-10-CM

## 2020-08-22 DIAGNOSIS — M25512 Pain in left shoulder: Secondary | ICD-10-CM

## 2020-08-22 DIAGNOSIS — M545 Low back pain, unspecified: Secondary | ICD-10-CM

## 2020-08-22 NOTE — Patient Instructions (Signed)
Access Code: [email or web address] URL: [email or web address] Date: 08/22/2020 Prepared by: Pauletta Browns  Exercises Standing Hip Hiking - 2 x daily - 7 x weekly - 2 sets - 10 reps - 3 seconds hold

## 2020-08-22 NOTE — Therapy (Signed)
[place], [place], [address] Phone: [phone]   Fax:  [phone]  Physical Therapy Treatment  Patient Details  Name: Deborah Jacobson MRN: [id number] Date of Birth: [birth year] Referring Provider (PT): Dr Sol Passer   Encounter Date: 08/22/2020   PT End of Session - 08/22/20 1832    Visit Number 4    Number of Visits 12    Date for PT Re-Evaluation 09/13/20    Authorization Type [employer]    PT Start Time 1100    PT Stop Time 1140    PT Time Calculation (min) 40 min    Activity Tolerance Patient tolerated treatment well    Behavior During Therapy [facility] for tasks assessed/performed           Past Medical History:  Diagnosis Date  . Acid reflux   . Diabetes in pregnancy   . Hypertension   . Seasonal allergies     Past Surgical History:  Procedure Laterality Date  . CESAREAN SECTION      There were no vitals filed for this visit.   Subjective Assessment - 08/22/20 1829    Subjective Deborah Jacobson reports progress with her LBP.  She is going to follow recommendations regarding shoulder surgery.    Patient Stated Goals avoid surgery on her shoulder.    Currently in Pain? Yes    Pain Score 5     Pain Location Shoulder    Pain Orientation Left    Pain Descriptors / Indicators Aching;Sore;Sharp    Pain Type Chronic pain    Pain Onset More than a month ago    Pain Frequency Constant    Aggravating Factors  L UE use and sleeping    Effect of Pain on Daily Activities Out of work and can't use L arm with most ADLs                             [facility] Adult PT Treatment/Exercise - 08/22/20 0001      Exercises   Exercises Knee/Hip;Shoulder    Other Exercises  Cervical extension isometrics 10X 5 seconds      Knee/Hip Exercises: Stretches   Other Knee/Hip Stretches Pelvic rotation AROM 20X 10 seconds      Knee/Hip Exercises: Standing   Hip Abduction Stengthening;Both;3 sets;10 reps   Standing  hip hike with perfect posture   Other Standing Knee Exercises Trunk extension AROM 10X 3 seconds    Other Standing Knee Exercises Shoulder blades pinch 10X 5 seconds      Shoulder Exercises: Supine   Protraction Strengthening;Left;10 reps   3 seconds     Shoulder Exercises: Standing   Other Standing Exercises Pendulum: F/B; CW; CCW; R/L                  PT Education - 08/22/20 1831    Education Details Reviewed and added to HEP.    Person(s) Educated Patient    Methods Explanation;Demonstration;Tactile cues;Verbal cues;Handout    Comprehension Verbalized understanding;Tactile cues required;Need further instruction;Returned demonstration;Verbal cues required;Other (comment)            PT Short Term Goals - 08/22/20 1832      PT SHORT TERM GOAL #1   Title I with initial HEP    Time 4    Period Weeks    Status Achieved    Target Date 08/23/20      PT SHORT TERM GOAL #2  Title report =/> 50% reduction of overall back pain to allow return to normal lumbar and cervical motion    Time 3    Period Weeks    Status Partially Met    Target Date 08/23/20      PT SHORT TERM GOAL #3   Title assess Lt shoulder strength and set goals    Time 3    Period Weeks    Status On-going    Target Date 08/23/20             PT Long Term Goals - 08/22/20 1832      PT LONG TERM GOAL #1   Title I with advanced HEP    Time 6    Period Weeks    Status On-going      PT LONG TERM GOAL #2   Title report minimal to no back pain with daily activity    Time 6    Period Weeks    Status Partially Met      PT LONG TERM GOAL #3   Title report decreased LT shoulder pain to allow her to consistently perform her own bathing and dressing    Time 6    Period Weeks    Status On-going      PT LONG TERM GOAL #4   Title improve quick DASH =/< 40% limited    Time 6    Period Weeks    Status On-going      PT LONG TERM GOAL #5   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  She is in the process of setting up a labral repair of her shoulder.  We will continue to try to keep the AROM of her shoulder in preparation for surgery while focusing on her back with her supervised PT.    Personal Factors and Comorbidities Comorbidity 3+    Comorbidities HTN, pre- DM, HA's, chronic LBP    Examination-Activity Limitations Reach Overhead;Bend;Caring for Others;Dressing;Hygiene/Grooming;Lift;Sleep    Examination-Participation Restrictions Cleaning;Meal Prep;Laundry    Rehab Potential Good    PT Frequency 2x / week    PT Duration 6 weeks    PT Treatment/Interventions Iontophoresis 72m/ml Dexamethasone;Taping;Vasopneumatic Device;Patient/family education;Moist Heat;Therapeutic activities;Dry needling;Spinal Manipulations;Passive range of motion;Therapeutic exercise;Cryotherapy;Electrical Stimulation;Neuromuscular re-education;Manual techniques    PT Next Visit Plan Progress low back strength, walking and as much shoulder AROM as she can handle.    PT Home Exercise Plan Access Code: [email or web address]  Trunk extension AROM and walking.  Access Code: [email or web address]   Consulted and Agree with Plan of Care Patient           Patient will benefit from skilled therapeutic intervention in order to improve the following deficits and impairments:  Decreased range of motion, Obesity, Impaired UE functional use, Increased muscle spasms, Pain, Hypomobility, Decreased strength  Visit Diagnosis: Chronic pain in left shoulder  Stiffness of left shoulder, not elsewhere classified  Posture abnormality  Muscle weakness (generalized)  Chronic low back pain, unspecified back pain laterality, unspecified whether sciatica present  Difficulty in walking, not elsewhere  classified     Problem List Patient Active Problem List   Diagnosis Date Noted  . Morbid obesity  with BMI of 45.0-49.9, adult ([place]) 08/09/2020  . Superior glenoid labrum lesion of left shoulder 07/10/2020  . Health maintenance examination 03/23/2019  . Prediabetes December 29, 2018  . Strain of back 29-Dec-2018  . Death of family member 2018-12-29  . Back pain 07/31/2018  . Fatigue 05/31/2018  . Chronic low back pain 11/30/2017  . Shifting sleep-work schedule 06/15/2017  . 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 Sleep related headaches 06/15/2017  . Super obese 06/15/2017  . Mood complaints in sleep disorder 06/15/2017  . Plantar fasciitis of left foot 03/01/2014  . Porokeratosis 03/01/2014  . Pain in lower limb 03/01/2014    Farley Ly PT, MPT 08/22/2020, 6:36 PM  [facility] [address] [place], [place], [address] Phone: [phone]   Fax:  [phone]  Name: Deborah Jacobson MRN: [id number] Date of Birth: 02-07-1981

## 2020-08-23 ENCOUNTER — Telehealth: Payer: Self-pay | Admitting: Nurse Practitioner

## 2020-08-23 DIAGNOSIS — N9489 Other specified conditions associated with female genital organs and menstrual cycle: Secondary | ICD-10-CM

## 2020-08-23 NOTE — Telephone Encounter (Signed)
Called to give results of her ultrasound, there was an incidental finding of an adenaxal mass which looks like a cyst, will order additional ultrasound. Pending results will make referral as necessary. Will also cancel upcoming appt. She is to have shoulder surgery on 10/20.

## 2020-08-24 ENCOUNTER — Encounter: Payer: 59 | Admitting: Rehabilitative and Restorative Service Providers"

## 2020-08-25 ENCOUNTER — Other Ambulatory Visit (HOSPITAL_COMMUNITY)
Admission: RE | Admit: 2020-08-25 | Discharge: 2020-08-25 | Disposition: A | Discharge status: Home / Self Care | Payer: 59 | Source: Ambulatory Visit | Attending: Orthopaedic Surgery | Admitting: Orthopaedic Surgery

## 2020-08-25 DIAGNOSIS — Z01812 Encounter for preprocedural laboratory examination: Secondary | ICD-10-CM | POA: Diagnosis present

## 2020-08-25 DIAGNOSIS — Z20822 Contact with and (suspected) exposure to covid-19: Secondary | ICD-10-CM | POA: Diagnosis not present

## 2020-08-25 LAB — SARS CORONAVIRUS 2 (TAT 6-24 HRS): SARS Coronavirus 2: NEGATIVE

## 2020-08-27 ENCOUNTER — Telehealth: Payer: Self-pay | Admitting: Orthopaedic Surgery

## 2020-08-27 ENCOUNTER — Encounter (HOSPITAL_BASED_OUTPATIENT_CLINIC_OR_DEPARTMENT_OTHER)
Admission: RE | Admit: 2020-08-27 | Discharge: 2020-08-27 | Disposition: A | Discharge status: Home / Self Care | Payer: 59 | Source: Ambulatory Visit | Attending: Orthopaedic Surgery | Admitting: Orthopaedic Surgery

## 2020-08-27 ENCOUNTER — Encounter: Payer: Self-pay | Admitting: Orthopaedic Surgery

## 2020-08-27 ENCOUNTER — Telehealth: Payer: Self-pay | Admitting: Rehabilitative and Restorative Service Providers"

## 2020-08-27 ENCOUNTER — Encounter: Payer: 59 | Admitting: Rehabilitative and Restorative Service Providers"

## 2020-08-27 DIAGNOSIS — Z01818 Encounter for other preprocedural examination: Secondary | ICD-10-CM | POA: Diagnosis not present

## 2020-08-27 LAB — BASIC METABOLIC PANEL
Anion gap: 7 (ref 5–15)
BUN: 10 mg/dL (ref 6–20)
CO2: 26 mmol/L (ref 22–32)
Calcium: 8.9 mg/dL (ref 8.9–10.3)
Chloride: 107 mmol/L (ref 98–111)
Creatinine, Ser: 0.85 mg/dL (ref 0.44–1.00)
GFR, Estimated: >60 mL/min (ref >60)
Glucose, Bld: 93 mg/dL (ref 70–99)
Potassium: 4.3 mmol/L (ref 3.5–5.1)
Sodium: 140 mmol/L (ref 135–145)

## 2020-08-27 NOTE — Progress Notes (Signed)

## 2020-08-27 NOTE — Telephone Encounter (Signed)
E 

## 2020-08-27 NOTE — Telephone Encounter (Signed)
Zarin said she called to cancel.  She has surgery Wednesday.  No further appointments currently scheduled.  Will follow-up post-surgery.

## 2020-08-29 ENCOUNTER — Ambulatory Visit (HOSPITAL_BASED_OUTPATIENT_CLINIC_OR_DEPARTMENT_OTHER): Payer: 59 | Admitting: Anesthesiology

## 2020-08-29 ENCOUNTER — Encounter (HOSPITAL_BASED_OUTPATIENT_CLINIC_OR_DEPARTMENT_OTHER)
Admission: RE | Disposition: A | Discharge status: Home / Self Care | Payer: Self-pay | Source: Home / Self Care | Attending: Orthopaedic Surgery

## 2020-08-29 ENCOUNTER — Other Ambulatory Visit: Payer: Self-pay

## 2020-08-29 ENCOUNTER — Other Ambulatory Visit: Payer: Self-pay | Admitting: Nurse Practitioner

## 2020-08-29 ENCOUNTER — Encounter: Payer: Self-pay | Admitting: Orthopaedic Surgery

## 2020-08-29 ENCOUNTER — Encounter (HOSPITAL_BASED_OUTPATIENT_CLINIC_OR_DEPARTMENT_OTHER): Payer: Self-pay | Admitting: Orthopaedic Surgery

## 2020-08-29 ENCOUNTER — Ambulatory Visit (HOSPITAL_BASED_OUTPATIENT_CLINIC_OR_DEPARTMENT_OTHER)
Admission: RE | Admit: 2020-08-29 | Discharge: 2020-08-29 | Disposition: A | Discharge status: Home / Self Care | Payer: 59 | Attending: Orthopaedic Surgery | Admitting: Orthopaedic Surgery

## 2020-08-29 DIAGNOSIS — Z7984 Long term (current) use of oral hypoglycemic drugs: Secondary | ICD-10-CM | POA: Diagnosis not present

## 2020-08-29 DIAGNOSIS — Z79899 Other long term (current) drug therapy: Secondary | ICD-10-CM | POA: Diagnosis not present

## 2020-08-29 DIAGNOSIS — G4730 Sleep apnea, unspecified: Secondary | ICD-10-CM | POA: Diagnosis not present

## 2020-08-29 DIAGNOSIS — E119 Type 2 diabetes mellitus without complications: Secondary | ICD-10-CM | POA: Insufficient documentation

## 2020-08-29 DIAGNOSIS — M67912 Unspecified disorder of synovium and tendon, left shoulder: Secondary | ICD-10-CM

## 2020-08-29 DIAGNOSIS — Z6841 Body Mass Index (BMI) 40.0 and over, adult: Secondary | ICD-10-CM | POA: Insufficient documentation

## 2020-08-29 DIAGNOSIS — S43432A Superior glenoid labrum lesion of left shoulder, initial encounter: Secondary | ICD-10-CM | POA: Diagnosis not present

## 2020-08-29 DIAGNOSIS — X58XXXA Exposure to other specified factors, initial encounter: Secondary | ICD-10-CM | POA: Insufficient documentation

## 2020-08-29 DIAGNOSIS — J302 Other seasonal allergic rhinitis: Secondary | ICD-10-CM | POA: Diagnosis not present

## 2020-08-29 DIAGNOSIS — M7552 Bursitis of left shoulder: Secondary | ICD-10-CM | POA: Diagnosis not present

## 2020-08-29 DIAGNOSIS — Z87891 Personal history of nicotine dependence: Secondary | ICD-10-CM | POA: Diagnosis not present

## 2020-08-29 DIAGNOSIS — I10 Essential (primary) hypertension: Secondary | ICD-10-CM | POA: Diagnosis not present

## 2020-08-29 DIAGNOSIS — M67814 Other specified disorders of tendon, left shoulder: Secondary | ICD-10-CM | POA: Insufficient documentation

## 2020-08-29 HISTORY — PX: SHOULDER ARTHROSCOPY WITH BICEPS TENDON REPAIR: SHX5674

## 2020-08-29 LAB — POCT PREGNANCY, URINE: Preg Test, Ur: NEGATIVE

## 2020-08-29 SURGERY — SHOULDER ARTHROSCOPY WITH BICEPS TENDON REPAIR
Anesthesia: General | Site: Shoulder | Laterality: Left

## 2020-08-29 MED ORDER — PROPOFOL 10 MG/ML IV BOLUS
INTRAVENOUS | Status: AC
  Filled 2020-08-29: qty 20

## 2020-08-29 MED ORDER — CEFAZOLIN SODIUM-DEXTROSE 2-4 GM/100ML-% IV SOLN
INTRAVENOUS | Status: AC
  Filled 2020-08-29: qty 100

## 2020-08-29 MED ORDER — GLYCOPYRROLATE 0.2 MG/ML IJ SOLN
INTRAMUSCULAR | Status: DC | PRN
  Administered 2020-08-29: .2 mg via INTRAVENOUS

## 2020-08-29 MED ORDER — ROCURONIUM BROMIDE 100 MG/10ML IV SOLN
INTRAVENOUS | Status: DC | PRN
  Administered 2020-08-29: 60 mg via INTRAVENOUS

## 2020-08-29 MED ORDER — FENTANYL CITRATE (PF) 100 MCG/2ML IJ SOLN
INTRAMUSCULAR | Status: AC
  Filled 2020-08-29: qty 2

## 2020-08-29 MED ORDER — SUGAMMADEX SODIUM 200 MG/2ML IV SOLN
INTRAVENOUS | Status: DC | PRN
  Administered 2020-08-29: 200 mg via INTRAVENOUS

## 2020-08-29 MED ORDER — BUPIVACAINE-EPINEPHRINE (PF) 0.5% -1:200000 IJ SOLN
INTRAMUSCULAR | Status: DC | PRN
  Administered 2020-08-29: 30 mL via PERINEURAL

## 2020-08-29 MED ORDER — MIDAZOLAM HCL 2 MG/2ML IJ SOLN
INTRAMUSCULAR | Status: AC
  Filled 2020-08-29: qty 2

## 2020-08-29 MED ORDER — CLONIDINE HCL (ANALGESIA) 100 MCG/ML EP SOLN
EPIDURAL | Status: DC | PRN
  Administered 2020-08-29: 50 ug

## 2020-08-29 MED ORDER — PROMETHAZINE HCL 25 MG/ML IJ SOLN: 6.2500 mg | INTRAMUSCULAR | Status: DC | PRN

## 2020-08-29 MED ORDER — GLYCOPYRROLATE PF 0.2 MG/ML IJ SOSY
PREFILLED_SYRINGE | INTRAMUSCULAR | Status: AC
  Filled 2020-08-29: qty 1

## 2020-08-29 MED ORDER — LIDOCAINE 2% (20 MG/ML) 5 ML SYRINGE
INTRAMUSCULAR | Status: AC
  Filled 2020-08-29: qty 5

## 2020-08-29 MED ORDER — HYDROCODONE-ACETAMINOPHEN 7.5-325 MG PO TABS
1.0000 | ORAL_TABLET | Freq: Three times a day (TID) | ORAL | 0 refills | Status: DC | PRN
Start: 2020-08-29 — End: 2020-09-11

## 2020-08-29 MED ORDER — ONDANSETRON HCL 4 MG/2ML IJ SOLN
INTRAMUSCULAR | Status: DC | PRN
  Administered 2020-08-29: 4 mg via INTRAVENOUS

## 2020-08-29 MED ORDER — LACTATED RINGERS IV SOLN: INTRAVENOUS | Status: DC

## 2020-08-29 MED ORDER — DEXAMETHASONE SODIUM PHOSPHATE 4 MG/ML IJ SOLN
INTRAMUSCULAR | Status: DC | PRN
  Administered 2020-08-29: 5 mg via INTRAVENOUS

## 2020-08-29 MED ORDER — ACETAMINOPHEN 500 MG PO TABS
ORAL_TABLET | ORAL | Status: AC
  Filled 2020-08-29: qty 2

## 2020-08-29 MED ORDER — CEFAZOLIN SODIUM-DEXTROSE 2-4 GM/100ML-% IV SOLN
2.0000 g | INTRAVENOUS | Status: AC
  Administered 2020-08-29: 2 g via INTRAVENOUS

## 2020-08-29 MED ORDER — LIDOCAINE HCL (CARDIAC) PF 100 MG/5ML IV SOSY
PREFILLED_SYRINGE | INTRAVENOUS | Status: DC | PRN
  Administered 2020-08-29: 50 mg via INTRAVENOUS

## 2020-08-29 MED ORDER — FENTANYL CITRATE (PF) 100 MCG/2ML IJ SOLN
INTRAMUSCULAR | Status: DC | PRN
  Administered 2020-08-29: 50 ug via INTRAVENOUS

## 2020-08-29 MED ORDER — HYDRALAZINE HCL 20 MG/ML IJ SOLN
INTRAMUSCULAR | Status: AC
  Filled 2020-08-29: qty 1

## 2020-08-29 MED ORDER — PROPOFOL 10 MG/ML IV BOLUS
INTRAVENOUS | Status: DC | PRN
  Administered 2020-08-29: 160 mg via INTRAVENOUS

## 2020-08-29 MED ORDER — ROCURONIUM BROMIDE 10 MG/ML (PF) SYRINGE
PREFILLED_SYRINGE | INTRAVENOUS | Status: AC
  Filled 2020-08-29: qty 10

## 2020-08-29 MED ORDER — ACETAMINOPHEN 500 MG PO TABS
1000.0000 mg | ORAL_TABLET | Freq: Once | ORAL | Status: AC
  Administered 2020-08-29: 1000 mg via ORAL

## 2020-08-29 MED ORDER — FENTANYL CITRATE (PF) 100 MCG/2ML IJ SOLN
100.0000 ug | Freq: Once | INTRAMUSCULAR | Status: AC
  Administered 2020-08-29: 50 ug via INTRAVENOUS

## 2020-08-29 MED ORDER — FENTANYL CITRATE (PF) 100 MCG/2ML IJ SOLN
25.0000 ug | INTRAMUSCULAR | Status: DC | PRN
  Administered 2020-08-29: 25 ug via INTRAVENOUS

## 2020-08-29 MED ORDER — MIDAZOLAM HCL 2 MG/2ML IJ SOLN
2.0000 mg | Freq: Once | INTRAMUSCULAR | Status: AC
  Administered 2020-08-29: 2 mg via INTRAVENOUS

## 2020-08-29 MED ORDER — HYDRALAZINE HCL 20 MG/ML IJ SOLN
10.0000 mg | Freq: Once | INTRAMUSCULAR | Status: AC
  Administered 2020-08-29: 10 mg via INTRAVENOUS

## 2020-08-29 MED ORDER — ONDANSETRON HCL 4 MG/2ML IJ SOLN
INTRAMUSCULAR | Status: AC
  Filled 2020-08-29: qty 2

## 2020-08-29 MED ORDER — OXYCODONE-ACETAMINOPHEN 5-325 MG PO TABS: 1.0000 | ORAL_TABLET | Freq: Three times a day (TID) | ORAL | 0 refills | Status: DC | PRN

## 2020-08-29 MED ORDER — PHENYLEPHRINE HCL (PRESSORS) 10 MG/ML IV SOLN
INTRAVENOUS | Status: DC | PRN
  Administered 2020-08-29 (×3): 80 ug via INTRAVENOUS

## 2020-08-29 MED ORDER — EPINEPHRINE PF 1 MG/ML IJ SOLN
INTRAMUSCULAR | Status: AC
  Filled 2020-08-29: qty 1

## 2020-08-29 SURGICAL SUPPLY — 72 items
ADH SKN CLS APL DERMABOND .7 (GAUZE/BANDAGES/DRESSINGS)
ANCH SUT SWLK 24.5 SLF PNCH VT (Anchor) ×1 IMPLANT
ANCHOR BIOCOMP SWIVELOCK (Anchor) ×2 IMPLANT
APL SKNCLS STERI-STRIP NONHPOA (GAUZE/BANDAGES/DRESSINGS)
BENZOIN TINCTURE PRP APPL 2/3 (GAUZE/BANDAGES/DRESSINGS) IMPLANT
BLADE EXCALIBUR 4.0MM X 13CM (MISCELLANEOUS) ×1
BLADE EXCALIBUR 4.0X13 (MISCELLANEOUS) ×1 IMPLANT
BURR OVAL 8 FLU 4.0MM X 13CM (MISCELLANEOUS) ×1
BURR OVAL 8 FLU 4.0X13 (MISCELLANEOUS) ×2 IMPLANT
CANNULA 5.75X71 LONG (CANNULA) ×3 IMPLANT
CANNULA SHOULDER 7CM (CANNULA) ×3 IMPLANT
CANNULA TWIST IN 8.25X7CM (CANNULA) ×2 IMPLANT
CLOSURE WOUND 1/2 X4 (GAUZE/BANDAGES/DRESSINGS)
COOLER ICEMAN CLASSIC (MISCELLANEOUS) ×2 IMPLANT
DERMABOND ADVANCED (GAUZE/BANDAGES/DRESSINGS)
DERMABOND ADVANCED .7 DNX12 (GAUZE/BANDAGES/DRESSINGS) IMPLANT
DISSECTOR  3.8MM X 13CM (MISCELLANEOUS) ×3
DISSECTOR 3.8MM X 13CM (MISCELLANEOUS) ×1 IMPLANT
DRAPE IMP U-DRAPE 54X76 (DRAPES) ×3 IMPLANT
DRAPE INCISE IOBAN 66X45 STRL (DRAPES) ×2 IMPLANT
DRAPE STERI 35X30 U-POUCH (DRAPES) ×3 IMPLANT
DRAPE U-SHAPE 47X51 STRL (DRAPES) ×3 IMPLANT
DRAPE U-SHAPE 76X120 STRL (DRAPES) ×6 IMPLANT
DRSG PAD ABDOMINAL 8X10 ST (GAUZE/BANDAGES/DRESSINGS) ×3 IMPLANT
DURAPREP 26ML APPLICATOR (WOUND CARE) ×3 IMPLANT
FIBER TAPE 2MM (SUTURE) IMPLANT
GAUZE SPONGE 4X4 12PLY STRL (GAUZE/BANDAGES/DRESSINGS) ×3 IMPLANT
GAUZE XEROFORM 1X8 LF (GAUZE/BANDAGES/DRESSINGS) ×3 IMPLANT
GLOVE BIOGEL PI IND STRL 7.0 (GLOVE) ×1 IMPLANT
GLOVE BIOGEL PI INDICATOR 7.0 (GLOVE) ×2
GLOVE ECLIPSE 7.0 STRL STRAW (GLOVE) ×3 IMPLANT
GLOVE SKINSENSE NS SZ7.5 (GLOVE) ×2
GLOVE SKINSENSE STRL SZ7.5 (GLOVE) ×1 IMPLANT
GLOVE SURG SS PI 7.5 STRL IVOR (GLOVE) ×6 IMPLANT
GLOVE SURG SYN 7.5  E (GLOVE) ×3
GLOVE SURG SYN 7.5 E (GLOVE) ×1 IMPLANT
GLOVE SURG SYN 7.5 PF PI (GLOVE) ×1 IMPLANT
GOWN STRL REIN XL XLG (GOWN DISPOSABLE) ×3 IMPLANT
GOWN STRL REUS W/ TWL LRG LVL3 (GOWN DISPOSABLE) ×1 IMPLANT
GOWN STRL REUS W/ TWL XL LVL3 (GOWN DISPOSABLE) ×1 IMPLANT
GOWN STRL REUS W/TWL LRG LVL3 (GOWN DISPOSABLE) ×3
GOWN STRL REUS W/TWL XL LVL3 (GOWN DISPOSABLE) ×3
MANIFOLD NEPTUNE II (INSTRUMENTS) ×3 IMPLANT
NDL SCORPION MULTI FIRE (NEEDLE) IMPLANT
NEEDLE SCORPION MULTI FIRE (NEEDLE) ×3 IMPLANT
PACK ARTHROSCOPY DSU (CUSTOM PROCEDURE TRAY) ×3 IMPLANT
PACK BASIN DAY SURGERY FS (CUSTOM PROCEDURE TRAY) ×3 IMPLANT
PAD COLD SHLDR WRAP-ON (PAD) ×2 IMPLANT
PORT APPOLLO RF 90DEGREE MULTI (SURGICAL WAND) ×3 IMPLANT
SHEET MEDIUM DRAPE 40X70 STRL (DRAPES) ×6 IMPLANT
SLEEVE SCD COMPRESS KNEE MED (MISCELLANEOUS) ×3 IMPLANT
SLING ARM FOAM STRAP LRG (SOFTGOODS) ×2 IMPLANT
STRIP CLOSURE SKIN 1/2X4 (GAUZE/BANDAGES/DRESSINGS) IMPLANT
SUPPORT WRAP ARM LG (MISCELLANEOUS) ×3 IMPLANT
SUT ETHILON 3 0 PS 1 (SUTURE) ×3 IMPLANT
SUT FIBERWIRE #2 38 T-5 BLUE (SUTURE)
SUT MNCRL AB 4-0 PS2 18 (SUTURE) ×3 IMPLANT
SUT PDS AB 1 CT  36 (SUTURE)
SUT PDS AB 1 CT 36 (SUTURE) IMPLANT
SUT TIGER TAPE 7 IN WHITE (SUTURE) IMPLANT
SUT VIC AB 2-0 CT1 27 (SUTURE) ×3
SUT VIC AB 2-0 CT1 TAPERPNT 27 (SUTURE) ×1 IMPLANT
SUTURE FIBERWR #2 38 T-5 BLUE (SUTURE) IMPLANT
SUTURE TAPE 1.3 40 TPR END (SUTURE) IMPLANT
SUTURE TAPE TIGERLINK 1.3MM BL (SUTURE) IMPLANT
SUTURETAPE 1.3 40 TPR END (SUTURE)
SUTURETAPE TIGERLINK 1.3MM BL (SUTURE) ×3
SYR 50ML SLIP (SYRINGE) ×3 IMPLANT
TOWEL GREEN STERILE FF (TOWEL DISPOSABLE) ×3 IMPLANT
TUBE CONNECTING 20'X1/4 (TUBING) ×1
TUBE CONNECTING 20X1/4 (TUBING) ×1 IMPLANT
TUBING ARTHROSCOPY IRRIG 16FT (MISCELLANEOUS) ×2 IMPLANT

## 2020-08-29 NOTE — Discharge Instructions (Signed)
Post-operative patient instructions  Shoulder Arthroscopy   . Ice:  Place intermittent ice or cooler pack over your shoulder, 30 minutes on and 30 minutes off.  Continue this for the first 72 hours after surgery, then save ice for use after therapy sessions or on more active days.   . Weight:  You may bear weight on your arm as your symptoms allow.  Do not lift more than 20 pounds for the first 6 weeks after surgery. . Motion:  Perform gentle shoulder motion as tolerated . Dressing:  Perform 1st dressing change at 2 days postoperative. A moderate amount of blood tinged drainage is to be expected.  So if you bleed through the dressing on the first or second day or if you have fevers, it is fine to change the dressing/check the wounds early and redress wound.  If it bleeds through again, or if the incisions are leaking frank blood, please call the office. May change dressing every 1-2 days thereafter to help watch wounds. Can purchase Tegaderm (or 11M Nexcare) water resistant dressings at local pharmacy / [facility]. . Shower:  Light shower is ok after 2 days.  Please take shower, NO bath. Recover with gauze and ace wrap to help keep wounds protected.   . Pain medication:  A narcotic pain medication has been prescribed.  Take as directed.  Typically you need narcotic pain medication more regularly during the first 3 to 5 days after surgery.  Decrease your use of the medication as the pain improves.  Narcotics can sometimes cause constipation, even after a few doses.  If you have problems with constipation, you can take an over the counter stool softener or light laxative.  If you have persistent problems, please notify your physician's office. Marland Kitchen Physical therapy: Additional activity guidelines to be provided by your physician or physical therapist at follow-up visits.  . Driving: Do not recommend driving x 2 weeks post surgical, especially if surgery performed on right side. Should not drive while taking  narcotic pain medications. It typically takes at least 2 weeks to restore sufficient neuromuscular function for normal reaction times for driving safety.  . Call [phone] for questions or problems. Evenings you will be forwarded to the hospital operator.  Ask for the orthopaedic physician on call. Please call if you experience:    o Redness, foul smelling, or persistent drainage from the surgical site  o worsening shoulder pain and swelling not responsive to medication  o any calf pain and or swelling of the lower leg  o temperatures greater than 101.5 F o other questions or concerns   Thank you for allowing [place] to be a part of your care.      Post Anesthesia Home Care Instructions  Activity: Get plenty of rest for the remainder of the day. A responsible individual must stay with you for 24 hours following the procedure.  For the next 24 hours, DO NOT: -Drive a car -Advertising copywriter -Drink alcoholic beverages -Take any medication unless instructed by your physician -Make any legal decisions or sign important papers.  Meals: Start with liquid foods such as gelatin or soup. Progress to regular foods as tolerated. Avoid greasy, spicy, heavy foods. If nausea and/or vomiting occur, drink only clear liquids until the nausea and/or vomiting subsides. Call your physician if vomiting continues.  Special Instructions/Symptoms: Your throat may feel dry or sore from the anesthesia or the breathing tube placed in your throat during surgery. If this causes discomfort, gargle with  warm salt water. The discomfort should disappear within 24 hours.     Regional Anesthesia Blocks  1. Numbness or the inability to move the "blocked" extremity may last from 3-48 hours after placement. The length of time depends on the medication injected and your individual response to the medication. If the numbness is not going away after 48 hours, call your surgeon.  2. The extremity that is blocked will need  to be protected until the numbness is gone and the  Strength has returned. Because you cannot feel it, you will need to take extra care to avoid injury. Because it may be weak, you may have difficulty moving it or using it. You may not know what position it is in without looking at it while the block is in effect.  3. For blocks in the legs and feet, returning to weight bearing and walking needs to be done carefully. You will need to wait until the numbness is entirely gone and the strength has returned. You should be able to move your leg and foot normally before you try and bear weight or walk. You will need someone to be with you when you first try to ensure you do not fall and possibly risk injury.  4. Bruising and tenderness at the needle site are common side effects and will resolve in a few days.  5. Persistent numbness or new problems with movement should be communicated to the surgeon or the [facility] [phone] [facility] [phone]).   The next dose of Tylenol can be given at 7:30pm if needed.

## 2020-08-29 NOTE — Anesthesia Procedure Notes (Signed)
Procedure Name: Intubation Date/Time: 08/29/2020 2:39 PM Performed by: Cleda Clarks, CRNA Pre-anesthesia Checklist: Patient identified, Emergency Drugs available, Suction available and Patient being monitored Patient Re-evaluated:Patient Re-evaluated prior to induction Oxygen Delivery Method: Circle system utilized Preoxygenation: Pre-oxygenation with 100% oxygen Induction Type: IV induction Ventilation: Mask ventilation without difficulty Laryngoscope Size: Miller and 2 Grade View: Grade II Tube type: Oral Tube size: 7.0 mm Number of attempts: 1 Airway Equipment and Method: Stylet and Oral airway Placement Confirmation: ETT inserted through vocal cords under direct vision,  positive ETCO2 and breath sounds checked- equal and bilateral Secured at: 21 cm Tube secured with: Tape Dental Injury: Teeth and Oropharynx as per pre-operative assessment

## 2020-08-29 NOTE — Transfer of Care (Signed)
Immediate Anesthesia Transfer of Care Note  Patient: Deborah Jacobson  Procedure(s) Performed: LEFT SHOULDER ARTHROSCOPY WITH BICEPS TENODESIS (Left Shoulder)  Patient Location: PACU  Anesthesia Type:GA combined with regional for post-op pain  Level of Consciousness: awake, alert  and oriented  Airway & Oxygen Therapy: Patient Spontanous Breathing and Patient connected to face mask oxygen  Post-op Assessment: Report given to RN and Post -op Vital signs reviewed and stable  Post vital signs: Reviewed and stable  Last Vitals:  Vitals Value Taken Time  BP 140/99 08/29/20 1557  Temp 36.4 C 08/29/20 1557  Pulse 79 08/29/20 1559  Resp 20 08/29/20 1559  SpO2 98 % 08/29/20 1559  Vitals shown include unvalidated device data.  Last Pain:  Vitals:   08/29/20 1319  TempSrc: Oral  PainSc: 3          Complications: No complications documented.

## 2020-08-29 NOTE — Anesthesia Procedure Notes (Signed)
Anesthesia Regional Block: Interscalene brachial plexus block   Pre-Anesthetic Checklist: ,, timeout performed, Correct Patient, Correct Site, Correct Laterality, Correct Procedure, Correct Position, site marked, Risks and benefits discussed,  Surgical consent,  Pre-op evaluation,  At surgeon's request and post-op pain management  Laterality: Left  Prep: chloraprep       Needles:  Injection technique: Single-shot  Needle Type: Echogenic Stimulator Needle     Needle Length: 5cm  Needle Gauge: 22     Additional Needles:   Procedures:,,,, ultrasound used (permanent image in chart),,,,  Narrative:  Start time: 08/29/2020 2:02 PM End time: 08/29/2020 2:07 PM Injection made incrementally with aspirations every 5 mL.  Performed by: Personally  Anesthesiologist: Cecile Hearing, MD  Additional Notes: Functioning IV was confirmed and monitors were applied.  A 75mm 22ga Arrow echogenic stimulator needle was used. Sterile prep and drape, hand hygiene, and sterile gloves were used.  Negative aspiration and negative test dose prior to incremental administration of local anesthetic. The patient tolerated the procedure well.  Ultrasound guidance: relevent anatomy identified, needle position confirmed, local anesthetic spread visualized around nerve(s), vascular puncture avoided.  Image printed for medical record.

## 2020-08-29 NOTE — Anesthesia Preprocedure Evaluation (Addendum)
Anesthesia Evaluation  Patient identified by MRN, date of birth, ID band Patient awake    Reviewed: Allergy & Precautions, NPO status , Patient's Chart, lab work & pertinent test results  Airway Mallampati: II  TM Distance: >3 FB Neck ROM: Full    Dental  (+) Dental Advisory Given, Chipped   Pulmonary sleep apnea , former smoker,    Pulmonary exam normal breath sounds clear to auscultation       Cardiovascular hypertension, Pt. on medications Normal cardiovascular exam Rhythm:Regular Rate:Normal     Neuro/Psych  Headaches, negative psych ROS   GI/Hepatic Neg liver ROS, GERD  ,  Endo/Other  diabetes, Type obesity  Renal/GU negative Renal ROS     Musculoskeletal negative musculoskeletal ROS (+)   Abdominal   Peds  Hematology negative hematology ROS (+)   Anesthesia Other Findings Day of surgery medications reviewed with the patient.  Reproductive/Obstetrics                            Anesthesia Physical Anesthesia Plan  ASA: III  Anesthesia Plan: General   Post-op Pain Management:  Regional for Post-op pain   Induction: Intravenous  PONV Risk Score and Plan: 3 and Midazolam, Dexamethasone and Ondansetron  Airway Management Planned: Oral ETT  Additional Equipment:   Intra-op Plan:   Post-operative Plan: Extubation in OR  Informed Consent: I have reviewed the patients History and Physical, chart, labs and discussed the procedure including the risks, benefits and alternatives for the proposed anesthesia with the patient or authorized representative who has indicated his/her understanding and acceptance.       Plan Discussed with: CRNA  Anesthesia Plan Comments:         Anesthesia Quick Evaluation

## 2020-08-29 NOTE — Progress Notes (Signed)
Assisted Dr. Turk with left, ultrasound guided, interscalene  block. Side rails up, monitors on throughout procedure. See vital signs in flow sheet. Tolerated Procedure well. 

## 2020-08-29 NOTE — Op Note (Signed)
   Date of Surgery: 08/29/2020  INDICATIONS: The patient is a 39 year old female with left shoulder pain that has failed conservative treatment;  The patient did consent to the procedure after discussion of the risks and benefits.  PREOPERATIVE DIAGNOSIS:  1.  Left shoulder SLAP tear 2.  Left shoulder rotator cuff tendinopathy 3.  Left shoulder subacromial bursitis  POSTOPERATIVE DIAGNOSIS: Same.  PROCEDURE:  1.  Left shoulder arthroscopic biceps tenodesis 2.  Left shoulder extensive debridement of labrum, biceps, articular and bursal surface of the supraspinatus and infraspinatus, rotator interval, subscapularis tendon 3.  Left shoulder subacromial decompression without CA ligament release  SURGEON: N. Glee Arvin, M.D.  ASSIST: Starlyn Skeans [place], [place]; necessary for the timely completion of procedure and due to complexity of procedure..  ANESTHESIA:  general, regional  IV FLUIDS AND URINE: See anesthesia.  ESTIMATED BLOOD LOSS: minimal mL.  IMPLANTS: Arthrex 4.75 mm self punching swivel lock  COMPLICATIONS: None.  DESCRIPTION OF PROCEDURE: The patient was brought to the operating room and placed supine on the operating table.  The patient had been signed prior to the procedure and this was documented. The patient had the anesthesia placed by the anesthesiologist.  A time-out was performed to confirm that this was the correct patient, site, side and location. The patient did receive antibiotics prior to the incision and was re-dosed during the procedure as needed at indicated intervals.  The patient was then positioned into the beach chair position with all bony prominences well padded and neutral C spine. The patient had the operative extremity prepped and draped in the standard surgical fashion.    Incisions were created for standard shoulder arthroscopy portals.  Diagnostic shoulder arthroscopy was first performed which revealed a type II SLAP lesion as well as degenerative  posterior superior labral tearing, mild biceps tendinopathy, mild subscapularis tendinopathy, synovitis of the rotator interval, mild articular surface tendinopathy of supraspinatus and infraspinatus.  An oscillating shaver was used to debride all of the structures back to stable tissue.  We then used a fiber loop to perform an arthroscopic biceps tenodesis into the bicipital groove just superior to the subscapularis tendon.  The bone quality was excellent.  We then repositioned the arthroscope into the subacromial space.  Subdeltoid and subacromial bursectomy performed.  She had a type I acromion.  CA ligament was left intact.  She had moderate tendinosis of the bursal surface of the supraspinatus infraspinatus.  Oscillating shaver was used to debride this tissue.  AC joint was unremarkable.  Excess fluid removed from the shoulder joint.  Incisions closed with interrupted nylon sutures.  Sterile dressings were applied.  Patient tolerated procedure well and no immediate complications.  POSTOPERATIVE PLAN: Discharge home and follow-up in 1 week for suture removal.  Mayra Reel, MD [facility] [phone] 3:41 PM

## 2020-08-29 NOTE — H&P (Signed)
PREOPERATIVE H&P  Chief Complaint: left shoulder superior labrum anterior posterior tear  HPI: Deborah Jacobson is a 39 y.o. female who presents for surgical treatment of left shoulder superior labrum anterior posterior tear.  She denies any changes in medical history.  Past Medical History:  Diagnosis Date  . Acid reflux   . Diabetes in pregnancy   . Hypertension   . Seasonal allergies    Past Surgical History:  Procedure Laterality Date  . CESAREAN SECTION     Social History   Socioeconomic History  . Marital status: Married    Spouse name: Not on file  . Number of children: Not on file  . Years of education: Not on file  . Highest education level: Not on file  Occupational History  . Not on file  Tobacco Use  . Smoking status: Former Games developer  . Smokeless tobacco: Never Used  Vaping Use  . Vaping Use: Never used  Substance and Sexual Activity  . Alcohol use: No  . Drug use: No  . Sexual activity: Yes    Birth control/protection: None  Other Topics Concern  . Not on file  Social History Narrative  . Not on file   Social Determinants of Health   Financial Resource Strain:   . Difficulty of Paying Living Expenses: Not on file  Food Insecurity:   . Worried About Programme researcher, broadcasting/film/video in the Last Year: Not on file  . Ran Out of Food in the Last Year: Not on file  Transportation Needs:   . Lack of Transportation (Medical): Not on file  . Lack of Transportation (Non-Medical): Not on file  Physical Activity:   . Days of Exercise per Week: Not on file  . Minutes of Exercise per Session: Not on file  Stress:   . Feeling of Stress : Not on file  Social Connections:   . Frequency of Communication with Friends and Family: Not on file  . Frequency of Social Gatherings with Friends and Family: Not on file  . Attends Religious Services: Not on file  . Active Member of Clubs or Organizations: Not on file  . Attends Banker Meetings: Not on file  .  Marital Status: Not on file   Family History  Problem Relation Age of Onset  . Diabetes Mother   . Liver disease Mother   . Hypertension Mother    Allergies  Allergen Reactions  . Latex Hives    Powder from gloves   Prior to Admission medications   Medication Sig Start Date End Date Taking? Authorizing Provider  baclofen (LIORESAL) 10 MG tablet Take 0.5-1 tablets (5-10 mg total) by mouth 3 (three) times daily as needed for muscle spasms. 06/22/20  Yes Hilts, Casimiro Needle, MD  diclofenac Sodium (VOLTAREN) 1 % GEL Apply 2 g topically 4 (four) times daily. 07/10/20  Yes Arnette Felts, FNP  diphenhydramine-acetaminophen (TYLENOL PM) 25-500 MG TABS tablet Take 1 tablet by mouth at bedtime as needed.   Yes [provider]  hydrochlorothiazide (HYDRODIURIL) 12.5 MG tablet Take 1 tablet (12.5 mg total) by mouth daily. 07/10/20  Yes Arnette Felts, FNP  hydrOXYzine (ATARAX/VISTARIL) 25 MG tablet Take 1 tablet (25 mg total) by mouth 3 (three) times daily as needed. 08/09/20  Yes Arnette Felts, FNP  meloxicam (MOBIC) 15 MG tablet Take 0.5-1 tablets (7.5-15 mg total) by mouth daily as needed for pain. 05/22/20  Yes Hilts, Casimiro Needle, MD  Semaglutide,0.25 or 0.5MG /DOS, (OZEMPIC, 0.25 OR 0.5 MG/DOSE,) 2  MG/1.5ML SOPN Inject 0.375 mLs (0.5 mg total) into the skin once a week. 07/10/20  Yes Arnette Felts, FNP  albuterol (VENTOLIN HFA) 108 (90 Base) MCG/ACT inhaler Inhale 2 puffs into the lungs every 6 (six) hours as needed for wheezing or shortness of breath. Patient not taking: Reported on 08/09/2020 03/23/19   Arnette Felts, FNP  diphenhydrAMINE (BENADRYL) 25 MG tablet Take 25 mg by mouth every 6 (six) hours as needed for allergies.    [provider]  cetirizine (ZYRTEC) 10 MG tablet Take 10 mg by mouth daily.  10/13/19  [provider]     Positive ROS: All other systems have been reviewed and were otherwise negative with the exception of those mentioned in the HPI and as above.  Physical  Exam: General: Alert, no acute distress Cardiovascular: No pedal edema Respiratory: No cyanosis, no use of accessory musculature GI: abdomen soft Skin: No lesions in the area of chief complaint Neurologic: Sensation intact distally Psychiatric: Patient is competent for consent with normal mood and affect Lymphatic: no lymphedema  MUSCULOSKELETAL: exam stable  Assessment: left shoulder superior labrum anterior posterior tear  Plan: Plan for Procedure(s): LEFT SHOULDER ARTHROSCOPY WITH BICEPS TENODESIS  The risks benefits and alternatives were discussed with the patient including but not limited to the risks of nonoperative treatment, versus surgical intervention including infection, bleeding, nerve injury,  blood clots, cardiopulmonary complications, morbidity, mortality, among others, and they were willing to proceed.   Preoperative templating of the joint replacement has been completed, documented, and submitted to the Operating Room personnel in order to optimize intra-operative equipment management.   Glee Arvin, MD 08/29/2020 6:58 AM

## 2020-08-30 ENCOUNTER — Encounter: Payer: Self-pay | Admitting: Nurse Practitioner

## 2020-08-30 ENCOUNTER — Encounter: Payer: Self-pay | Admitting: Family Medicine

## 2020-08-30 ENCOUNTER — Encounter: Payer: Self-pay | Admitting: Orthopaedic Surgery

## 2020-08-30 ENCOUNTER — Telehealth: Payer: Self-pay | Admitting: Orthopaedic Surgery

## 2020-08-30 ENCOUNTER — Telehealth: Payer: Self-pay

## 2020-08-30 ENCOUNTER — Encounter (HOSPITAL_BASED_OUTPATIENT_CLINIC_OR_DEPARTMENT_OTHER): Payer: Self-pay | Admitting: Orthopaedic Surgery

## 2020-08-30 NOTE — Telephone Encounter (Signed)
Pt stated at her last visit you told her you would cancel her upcoming appt and everything would be done at her physical appt in Dec. appt was not cancelled I did cancel it but did you want to see her back before Dec?

## 2020-08-30 NOTE — Telephone Encounter (Signed)
She also sent my chart messages.

## 2020-08-30 NOTE — Telephone Encounter (Signed)
Patient called requesting a call back. Patient had recent surgery and was given a ice machine. Patient has questions about machine. Please call patient at [phone].

## 2020-08-30 NOTE — Anesthesia Postprocedure Evaluation (Signed)
Anesthesia Post Note  Patient: Deborah Jacobson  Procedure(s) Performed: LEFT SHOULDER ARTHROSCOPY WITH BICEPS TENODESIS (Left Shoulder)     Patient location during evaluation: PACU Anesthesia Type: General Level of consciousness: awake and alert Pain management: pain level controlled Vital Signs Assessment: post-procedure vital signs reviewed and stable Respiratory status: spontaneous breathing, nonlabored ventilation and respiratory function stable Cardiovascular status: blood pressure returned to baseline and stable Postop Assessment: no apparent nausea or vomiting Anesthetic complications: no   No complications documented.  Last Vitals:  Vitals:   08/29/20 1723 08/29/20 1728  BP: (!) 148/100 (!) 151/104  Pulse: 61 64  Resp: 18   Temp: 36.5 C   SpO2: 95% 95%    Last Pain:  Vitals:   08/29/20 1723  TempSrc:   PainSc: 2                  Cecile Hearing

## 2020-08-31 ENCOUNTER — Encounter: Payer: 59 | Admitting: Rehabilitative and Restorative Service Providers"

## 2020-08-31 NOTE — Telephone Encounter (Signed)
No, we can wait until December she is preparing for surgery

## 2020-09-04 ENCOUNTER — Ambulatory Visit: Payer: 59 | Admitting: Nurse Practitioner

## 2020-09-05 ENCOUNTER — Ambulatory Visit (INDEPENDENT_AMBULATORY_CARE_PROVIDER_SITE_OTHER): Payer: 59 | Admitting: Orthopaedic Surgery

## 2020-09-05 ENCOUNTER — Encounter: Payer: Self-pay | Admitting: Orthopaedic Surgery

## 2020-09-05 ENCOUNTER — Other Ambulatory Visit: Payer: Self-pay

## 2020-09-05 DIAGNOSIS — S43432A Superior glenoid labrum lesion of left shoulder, initial encounter: Secondary | ICD-10-CM

## 2020-09-05 NOTE — Progress Notes (Signed)
   Post-Op Visit Note   Patient: Deborah Jacobson           Date of Birth: November 20, 1980           MRN: [id number] Visit Date: 09/05/2020 PCP: Arnette Felts, FNP   Assessment & Plan:  Chief Complaint:  Chief Complaint  Patient presents with  . Left Shoulder - Pain   Visit Diagnoses:  1. Superior glenoid labrum lesion of left shoulder, initial encounter     Plan: Deborah Jacobson is 1 week status post left shoulder scope biceps tenodesis.  Overall doing well.  Minimal pain.  Sutures removed today.  Neurovascular intact.  We will make a referral to outpatient PT.  She would like to see Deborah Jacobson specifically.  Questions encouraged and answered.  No lifting more than 20 pounds with the operative arm for 6 weeks after surgery.  Follow-up in 4 weeks.  Follow-Up Instructions: Return in about 4 weeks (around 10/03/2020).   Orders:  Orders Placed This Encounter  Procedures  . Ambulatory referral to Physical Therapy   No orders of the defined types were placed in this encounter.   Imaging: No results found.  PMFS History: Patient Active Problem List   Diagnosis Date Noted  . Subacromial bursitis of left shoulder joint 08/29/2020  . Tendinopathy of left rotator cuff 08/29/2020  . Morbid obesity with BMI of 45.0-49.9, adult (HCC) 08/09/2020  . Superior glenoid labrum lesion of left shoulder 07/10/2020  . Health maintenance examination 03/23/2019  . Prediabetes 12/06/18  . Strain of back Dec 06, 2018  . Death of family member December 06, 2018  . Back pain 07/31/2018  . Fatigue 05/31/2018  . Chronic low back pain 11/30/2017  . Shifting sleep-work schedule 06/15/2017  . 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 Sleep related headaches 06/15/2017  . Super obese 06/15/2017  . Mood complaints in sleep disorder 06/15/2017  . Plantar fasciitis of left foot 03/01/2014  . Porokeratosis 03/01/2014  . Pain in lower limb 03/01/2014   Past Medical History:  Diagnosis Date  . Acid reflux   . Diabetes in  pregnancy   . Hypertension   . Seasonal allergies     Family History  Problem Relation Age of Onset  . Diabetes Mother   . Liver disease Mother   . Hypertension Mother     Past Surgical History:  Procedure Laterality Date  . CESAREAN SECTION    . SHOULDER ARTHROSCOPY WITH BICEPS TENDON REPAIR Left 08/29/2020   Procedure: LEFT SHOULDER ARTHROSCOPY WITH BICEPS TENODESIS;  Surgeon: Tarry Kos, MD;  Location: [facility];  Service: Orthopedics;  Laterality: Left;   Social History   Occupational History  . Not on file  Tobacco Use  . Smoking status: Former Games developer  . Smokeless tobacco: Never Used  Vaping Use  . Vaping Use: Never used  Substance and Sexual Activity  . Alcohol use: No  . Drug use: No  . Sexual activity: Yes    Birth control/protection: None

## 2020-09-06 ENCOUNTER — Ambulatory Visit (INDEPENDENT_AMBULATORY_CARE_PROVIDER_SITE_OTHER): Payer: 59 | Admitting: Rehabilitative and Restorative Service Providers"

## 2020-09-06 ENCOUNTER — Encounter: Payer: Self-pay | Admitting: Rehabilitative and Restorative Service Providers"

## 2020-09-06 DIAGNOSIS — M6281 Muscle weakness (generalized): Secondary | ICD-10-CM | POA: Diagnosis not present

## 2020-09-06 DIAGNOSIS — M25612 Stiffness of left shoulder, not elsewhere classified: Secondary | ICD-10-CM

## 2020-09-06 DIAGNOSIS — G8929 Other chronic pain: Secondary | ICD-10-CM | POA: Diagnosis not present

## 2020-09-06 DIAGNOSIS — M25512 Pain in left shoulder: Secondary | ICD-10-CM

## 2020-09-06 NOTE — Patient Instructions (Signed)
Access Code: [email or web address] URL: [email or web address] Date: 09/06/2020 Prepared by: Pauletta Browns  Exercises Standing Scapular Retraction - 5 x daily - 7 x weekly - 1 sets - 5 reps - 5 second hold Supine Scapular Protraction in Flexion with Dumbbells - 2-3 x daily - 7 x weekly - 1 sets - 20 reps - 3 seconds hold Pendulums - 5 x daily - 7 x weekly - 1 sets - 20-30 reps

## 2020-09-06 NOTE — Therapy (Signed)
[facility] Physical Therapy [address] [place], [place], [address] Phone: [phone]   Fax:  [phone]  Physical Therapy Post-surgical Evaluation  Patient Details  Name: Deborah Jacobson MRN: [id number] Date of Birth: Mar 14, 1981 Referring Provider (PT): Tarry Kos MD   Encounter Date: 09/06/2020   PT End of Session - 09/06/20 1112    Visit Number 1    Number of Visits 12    Date for PT Re-Evaluation 11/09/20    Authorization Type [employer]    PT Start Time 1032    PT Stop Time 1102    PT Time Calculation (min) 30 min    Activity Tolerance Patient tolerated treatment well;No increased pain    Behavior During Therapy WFL for tasks assessed/performed           Past Medical History:  Diagnosis Date  . Acid reflux   . Diabetes in pregnancy   . Hypertension   . Seasonal allergies     Past Surgical History:  Procedure Laterality Date  . CESAREAN SECTION    . SHOULDER ARTHROSCOPY WITH BICEPS TENDON REPAIR Left 08/29/2020   Procedure: LEFT SHOULDER ARTHROSCOPY WITH BICEPS TENODESIS;  Surgeon: Tarry Kos, MD;  Location: [facility];  Service: Orthopedics;  Laterality: Left;    There were no vitals filed for this visit.    Subjective Assessment - 09/06/20 1108    Subjective Deborah Jacobson reports sleep is MUCH better post-surgery.    Pertinent History Limited use of L UE with reaching and overhead function.  She has her husband assist with donning her bra and washing.    Limitations House hold activities    Diagnostic tests MRI of shoulder - partial slap tear.    Patient Stated Goals Get shoulder healthy for return to normal activities including work.    Currently in Pain? 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    Multiple Pain Sites No              [facility] PT Assessment - 09/06/20 0001      Assessment   Medical Diagnosis s/p L biceps tenodesis, arthroscopy and labral repair 08/29/2020    Referring Provider (PT) Tarry Kos MD    Onset Date/Surgical Date 08/29/20      Precautions   Precautions Other (comment)   Go slow with ER progressions the first 6 weeks post-op     Balance Screen   Has the patient fallen in the past 6 months No    Has the patient had a decrease in activity level because of a fear of falling?  No    Is the patient reluctant to leave their home because of a fear of falling?  No      Prior Function   Level of Independence Independent      Cognition   Overall Cognitive Status Within Functional Limits for tasks assessed      Observation/Other Assessments   Focus on Therapeutic Outcomes (FOTO)  46 (67 Goal)  Posture/Postural Control   Posture/Postural Control Postural limitations    Postural Limitations Rounded Shoulders      ROM / Strength   AROM / PROM / Strength PROM      AROM   Overall AROM  Deficits    Right/Left Shoulder Left    Left Shoulder Flexion 100 Degrees    Left Shoulder Internal Rotation 60 Degrees    Left Shoulder External Rotation 30 Degrees    Left Shoulder Horizontal ADduction 10 Degrees                      Objective measurements completed on examination: See above findings.       [facility] Adult PT Treatment/Exercise - 09/06/20 0001      Exercises   Exercises Shoulder      Shoulder Exercises: Supine   Protraction AAROM;Left;10 reps   3 seconds     Shoulder Exercises: Standing   Retraction Strengthening;Both;10 reps   5 seconds shoulder blade pinches   Other Standing Exercises Pendulum: F/B; CW; CCW; R/L                  PT Education - 09/06/20 1111    Education Details Reviewed newly prescribed HEP,  covered phases of rehabilitation and discussed avoiding quick motions, WB or reaching behind her (ER) with the L arm.    Person(s) Educated Patient    Methods Explanation;Demonstration;Verbal cues;Handout    Comprehension Need further instruction;Returned demonstration;Verbal cues required;Verbalized understanding            PT Short Term Goals - 09/06/20 1118      PT SHORT TERM GOAL #1   Title I with initial HEP    Time 4    Period Weeks    Status New    Target Date 09/21/20      PT SHORT TERM GOAL #2   Title Report =/> 50% reduction of overall back pain to allow return to normal activities.    Time 12    Period Weeks    Status Revised    Target Date 11/09/20      PT SHORT TERM GOAL #3   Period Weeks    Status On-going    Target Date 11/09/20             PT Long Term Goals - 09/06/20 1120      PT LONG TERM GOAL #1   Title I with long-term HEP.    Time 12    Period Weeks    Status New    Target Date 11/09/20      PT LONG TERM GOAL #2   Title Deborah Jacobson will report L shoulder pain consistently 0-2/10 on the Numeric Pain Rating Scale.    Baseline 4-5/10    Time 12    Period Weeks    Status New    Target Date 11/09/20      PT LONG TERM GOAL #3   Title Improve FOTO score to 67.    Baseline 46    Time 12    Period Weeks    Status New    Target Date 11/09/20      PT LONG TERM GOAL #4   Title Improve shoulder AROM for flexion to 170; ER to 90; IR to 60 and horizontal adduction to 40 degrees.    Baseline See Objective.    Time 12    Period Weeks    Status New    Target Date 11/09/20  PT LONG TERM GOAL #5   Title Improve L shoulder strength as assessed by improve MMT scores.    Baseline Deferred due to just over 1 week post-surgery.    Time 12    Period Weeks    Status New    Target Date 11/09/20                  Plan - 09/06/20 1115    Clinical Impression Statement Amoy already notes significant pain relief post-surgery.  Early  emphasis will be on improving capsular flexibility with caution to avoid excessive ER.  Strength and other functional activities will be progressed as appropriate with expected transfer into independent PT in ~ 12 weeks.    Personal Factors and Comorbidities Comorbidity 3+    Comorbidities HTN, pre- DM, HA's, chronic LBP    Examination-Activity Limitations Reach Overhead;Bend;Caring for Others;Dressing;Hygiene/Grooming;Lift;Sleep    Examination-Participation Restrictions Cleaning;Meal Prep;Laundry    Stability/Clinical Decision Making Stable/Uncomplicated    Clinical Decision Making Low    Rehab Potential Good    PT Frequency 2x / week    PT Duration 12 weeks    PT Treatment/Interventions Patient/family education;Therapeutic activities;Passive range of motion;Therapeutic exercise;Cryotherapy;Neuromuscular re-education;Manual techniques;Vasopneumatic Device    PT Next Visit Plan Capsular stretching, PROM and AAROM    PT Home Exercise Plan Access Code: [email or web address]    Consulted and Agree with Plan of Care Patient           Patient will benefit from skilled therapeutic intervention in order to improve the following deficits and impairments:  Decreased range of motion, Obesity, Impaired UE functional use, Pain, Hypomobility, Decreased strength, Decreased activity tolerance, Decreased endurance, Increased edema  Visit Diagnosis: Stiffness of left shoulder, not elsewhere classified  Chronic pain in left shoulder  Muscle weakness (generalized)     Problem List Patient Active Problem List   Diagnosis Date Noted  . Subacromial bursitis of left shoulder joint 08/29/2020  . Tendinopathy of left rotator cuff 08/29/2020  . Morbid obesity with BMI of 45.0-49.9, adult (HCC) 08/09/2020  . Superior glenoid labrum lesion of left shoulder 07/10/2020  . Health maintenance examination 03/23/2019  . Prediabetes 2018-12-21  . Strain of back 12/21/2018  . Death of family member December 21, 2018  . Back pain  07/31/2018  . Fatigue 05/31/2018  . Chronic low back pain 11/30/2017  . Shifting sleep-work schedule 06/15/2017  . 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 Sleep related headaches 06/15/2017  . Super obese 06/15/2017  . Mood complaints in sleep disorder 06/15/2017  . Plantar fasciitis of left foot 03/01/2014  . Porokeratosis 03/01/2014  . Pain in lower limb 03/01/2014    Cherlyn Cushing PT, MPT 09/06/2020, 11:26 AM  [facility] Physical Therapy [address] [place], [place], [address] Phone: [phone]   Fax:  [phone]  Name: Deborah Jacobson MRN: [id number] Date of Birth: 11/19/1980

## 2020-09-07 ENCOUNTER — Encounter: Payer: Self-pay | Admitting: Orthopaedic Surgery

## 2020-09-07 ENCOUNTER — Other Ambulatory Visit: Payer: Self-pay | Admitting: Orthopaedic Surgery

## 2020-09-07 MED ORDER — ONDANSETRON HCL 4 MG PO TABS
4.0000 mg | ORAL_TABLET | Freq: Three times a day (TID) | ORAL | 0 refills | Status: DC | PRN
Start: 2020-09-07 — End: 2020-11-05

## 2020-09-07 NOTE — Telephone Encounter (Signed)
That is fine for note to be written for husband to be out of work to take care of wife.

## 2020-09-10 ENCOUNTER — Encounter: Payer: Self-pay | Admitting: Orthopaedic Surgery

## 2020-09-10 NOTE — Telephone Encounter (Signed)
Up to 6 weeks

## 2020-09-11 ENCOUNTER — Other Ambulatory Visit: Payer: Self-pay | Admitting: Physician Assistant

## 2020-09-11 MED ORDER — HYDROCODONE-ACETAMINOPHEN 5-325 MG PO TABS
1.0000 | ORAL_TABLET | Freq: Three times a day (TID) | ORAL | 0 refills | Status: DC | PRN
Start: 2020-09-11 — End: 2020-11-05

## 2020-09-11 NOTE — Telephone Encounter (Signed)
Sent in

## 2020-09-12 ENCOUNTER — Encounter: Payer: Self-pay | Admitting: Rehabilitative and Restorative Service Providers"

## 2020-09-12 ENCOUNTER — Other Ambulatory Visit: Payer: Self-pay

## 2020-09-12 ENCOUNTER — Ambulatory Visit (INDEPENDENT_AMBULATORY_CARE_PROVIDER_SITE_OTHER): Payer: 59 | Admitting: Rehabilitative and Restorative Service Providers"

## 2020-09-12 DIAGNOSIS — M6281 Muscle weakness (generalized): Secondary | ICD-10-CM

## 2020-09-12 DIAGNOSIS — G8929 Other chronic pain: Secondary | ICD-10-CM

## 2020-09-12 DIAGNOSIS — R293 Abnormal posture: Secondary | ICD-10-CM

## 2020-09-12 DIAGNOSIS — M25512 Pain in left shoulder: Secondary | ICD-10-CM | POA: Diagnosis not present

## 2020-09-12 DIAGNOSIS — M25612 Stiffness of left shoulder, not elsewhere classified: Secondary | ICD-10-CM

## 2020-09-12 NOTE — Therapy (Signed)
[facility] Physical Therapy [address] [place], [place], [address] Phone: [phone]   Fax:  [phone]  Physical Therapy Treatment  Patient Details  Name: Deborah Jacobson MRN: [id number] Date of Birth: [birth year] Referring Provider (PT): Deborah Kos MD   Encounter Date: 09/12/2020   PT End of Session - 09/12/20 1702    Visit Number 2    Number of Visits 12    Date for PT Re-Evaluation 11/09/20    Authorization Type [employer]    PT Start Time 1515    PT Stop Time 1555    PT Time Calculation (min) 40 min    Activity Tolerance Patient tolerated treatment well;No increased pain    Behavior During Therapy WFL for tasks assessed/performed           Past Medical History:  Diagnosis Date  . Acid reflux   . Diabetes in pregnancy   . Hypertension   . Seasonal allergies     Past Surgical History:  Procedure Laterality Date  . CESAREAN SECTION    . SHOULDER ARTHROSCOPY WITH BICEPS TENDON REPAIR Left 08/29/2020   Procedure: LEFT SHOULDER ARTHROSCOPY WITH BICEPS TENODESIS;  Surgeon: Deborah Kos, MD;  Location: [facility];  Service: Orthopedics;  Laterality: Left;    There were no vitals filed for this visit.   Subjective Assessment - 09/12/20 1523    Subjective Deborah Jacobson reports good HEP compliance.    Pertinent History Limited use of L UE with reaching and overhead function.  She has her husband assist with donning her bra and washing.    Limitations House hold activities    Diagnostic tests MRI of shoulder - partial slap tear.    Patient Stated Goals Get shoulder healthy for return to normal activities including work.    Currently in Pain? 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    Multiple Pain Sites No                             [facility] Adult PT Treatment/Exercise - 09/12/20 0001      Posture/Postural Control   Posture/Postural Control Postural limitations    Postural Limitations Rounded Shoulders      Exercises   Exercises Shoulder      Shoulder Exercises: Supine   Protraction AROM;Left;10 reps   3 seconds   External Rotation AROM;Left;10 reps   Slow to comfort   Internal Rotation AROM;Left;10 reps   10 seconds   Flexion AROM;Left;10 reps   No hold; protract 1st     Shoulder Exercises: Standing   Retraction Strengthening;Both;10 reps   5 seconds shoulder blade pinches   Other Standing Exercises Pendulum: F/B; CW; CCW; R/L                  PT Education - 09/12/20 1700    Education Details Reviewed and updated HEP.    Person(s) Educated Patient    Methods Explanation;Demonstration;Tactile cues;Verbal cues  Comprehension Verbalized understanding;Need further instruction;Returned demonstration;Verbal cues required;Tactile cues required            PT Short Term Goals - 09/12/20 1700      PT SHORT TERM GOAL #1   Title I with initial HEP    Time 4    Period Weeks    Status Achieved    Target Date 09/21/20      PT SHORT TERM GOAL #2   Title Report =/> 50% reduction of overall back pain to allow return to normal activities.    Time 12    Period Weeks    Status On-going    Target Date 11/09/20      PT SHORT TERM GOAL #3   Period Weeks    Status On-going    Target Date 11/09/20             PT Long Term Goals - 09/12/20 1701      PT LONG TERM GOAL #1   Title I with long-term HEP.    Time 12    Period Weeks    Status On-going      PT LONG TERM GOAL #2   Title Deborah Jacobson will report L shoulder pain consistently 0-2/10 on the Numeric Pain Rating Scale.    Baseline 4-5/10    Time 12    Period Weeks    Status On-going      PT  LONG TERM GOAL #3   Title Improve FOTO score to 67.    Baseline 46    Time 12    Period Weeks    Status On-going      PT LONG TERM GOAL #4   Title Improve shoulder AROM for flexion to 170; ER to 90; IR to 60 and horizontal adduction to 40 degrees.    Baseline See Objective.    Time 12    Period Weeks    Status On-going      PT LONG TERM GOAL #5   Title Improve L shoulder strength as assessed by improve MMT scores.    Baseline Deferred due to just over 1 week post-surgery.    Time 12    Period Weeks    Status On-going                 Plan - 09/12/20 1702    Clinical Impression Statement Deborah Jacobson continues to note progress with her sleep and L shoulder AROM.  I reinforced the need to be careful with ER and lifting as she described lifting her bag/purse in an awkward position that could put strain on the labrum and biceps tenodesis.  Continue AROM and functional work with progression to strengthening as appropriate.    Personal Factors and Comorbidities Comorbidity 3+    Comorbidities HTN, pre- DM, HA's, chronic LBP    Examination-Activity Limitations Reach Overhead;Bend;Caring for Others;Dressing;Hygiene/Grooming;Lift;Sleep    Examination-Participation Restrictions Cleaning;Meal Prep;Laundry    Stability/Clinical Decision Making Stable/Uncomplicated    Rehab Potential Good    PT Frequency 2x / week    PT Duration 12 weeks    PT Treatment/Interventions Patient/family education;Therapeutic activities;Passive range of motion;Therapeutic exercise;Cryotherapy;Neuromuscular re-education;Manual techniques;Vasopneumatic Device    PT Next Visit Plan Capsular stretching, PROM and AAROM    PT Home Exercise Plan Access Code: [email or web address].  Access Code: [email or web address]    Consulted and Agree with Plan of Care Patient           Patient will benefit from skilled therapeutic intervention in order to improve the following deficits  and impairments:  Decreased range of motion, Obesity, Impaired UE  functional use, Pain, Hypomobility, Decreased strength, Decreased activity tolerance, Decreased endurance, Increased edema  Visit Diagnosis: Stiffness of left shoulder, not elsewhere classified  Chronic pain in left shoulder  Muscle weakness (generalized)  Posture abnormality     Problem List Patient Active Problem List   Diagnosis Date Noted  . Subacromial bursitis of left shoulder joint 08/29/2020  . Tendinopathy of left rotator cuff 08/29/2020  . Morbid obesity with BMI of 45.0-49.9, adult (HCC) 08/09/2020  . Superior glenoid labrum lesion of left shoulder 07/10/2020  . Health maintenance examination 03/23/2019  . Prediabetes 12/03/18  . Strain of back 12-03-2018  . Death of family member 2018/12/03  . Back pain 07/31/2018  . Fatigue 05/31/2018  . Chronic low back pain 11/30/2017  . Shifting sleep-work schedule 06/15/2017  . 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 Sleep related headaches 06/15/2017  . Super obese 06/15/2017  . Mood complaints in sleep disorder 06/15/2017  . Plantar fasciitis of left foot 03/01/2014  . Porokeratosis 03/01/2014  . Pain in lower limb 03/01/2014    Cherlyn Cushing PT, MPT 09/12/2020, 5:06 PM  [facility] Physical Therapy [address] [place], [place], [address] Phone: [phone]   Fax:  [phone]  Name: Deborah Jacobson MRN: [id number] Date of Birth: [birth year]

## 2020-09-14 ENCOUNTER — Encounter: Payer: Self-pay | Admitting: Orthopaedic Surgery

## 2020-09-19 ENCOUNTER — Ambulatory Visit: Payer: 59 | Admitting: Rehabilitative and Restorative Service Providers"

## 2020-09-20 ENCOUNTER — Ambulatory Visit (INDEPENDENT_AMBULATORY_CARE_PROVIDER_SITE_OTHER): Payer: 59 | Admitting: Rehabilitative and Restorative Service Providers"

## 2020-09-20 ENCOUNTER — Encounter: Payer: Self-pay | Admitting: Rehabilitative and Restorative Service Providers"

## 2020-09-20 ENCOUNTER — Other Ambulatory Visit: Payer: Self-pay

## 2020-09-20 DIAGNOSIS — M25512 Pain in left shoulder: Secondary | ICD-10-CM | POA: Diagnosis not present

## 2020-09-20 DIAGNOSIS — M25612 Stiffness of left shoulder, not elsewhere classified: Secondary | ICD-10-CM | POA: Diagnosis not present

## 2020-09-20 DIAGNOSIS — M6281 Muscle weakness (generalized): Secondary | ICD-10-CM

## 2020-09-20 DIAGNOSIS — G8929 Other chronic pain: Secondary | ICD-10-CM

## 2020-09-20 NOTE — Therapy (Signed)
[facility] Physical Therapy [address] [place], [place], [address] Phone: [phone]   Fax:  [phone]  Physical Therapy Treatment  Patient Details  Name: Deborah Jacobson MRN: [id number] Date of Birth: 05-02-1981 Referring Provider (PT): Tarry Kos MD   Encounter Date: 09/20/2020   PT End of Session - 09/20/20 1228    Visit Number 3    Number of Visits 12    Date for PT Re-Evaluation 11/09/20    Authorization Type [employer]    PT Start Time 1140    PT Stop Time 1224    PT Time Calculation (min) 44 min    Activity Tolerance Patient tolerated treatment well;No increased pain    Behavior During Therapy WFL for tasks assessed/performed           Past Medical History:  Diagnosis Date  . Acid reflux   . Diabetes in pregnancy   . Hypertension   . Seasonal allergies     Past Surgical History:  Procedure Laterality Date  . CESAREAN SECTION    . SHOULDER ARTHROSCOPY WITH BICEPS TENDON REPAIR Left 08/29/2020   Procedure: LEFT SHOULDER ARTHROSCOPY WITH BICEPS TENODESIS;  Surgeon: Tarry Kos, MD;  Location: [facility];  Service: Orthopedics;  Laterality: Left;    There were no vitals filed for this visit.   Subjective Assessment - 09/20/20 1224    Subjective Deborah Jacobson reports sleep is improving.  She does have some soreness from some shredding and paperwork at home.    Pertinent History Limited use of L UE with reaching and overhead function.  She has her husband assist with donning her bra and washing.    Limitations House hold activities    Diagnostic tests MRI of shoulder - partial slap tear.    Patient Stated Goals Get shoulder healthy for return to normal activities including work.    Currently in Pain? No/denies    Pain Onset 1 to 4 weeks ago    Aggravating Factors  Overuse.    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    Multiple Pain Sites No               [facility] PT Assessment - 09/20/20 0001      AROM   Left Shoulder Flexion 120 Degrees    Left Shoulder Internal Rotation 60 Degrees    Left Shoulder External Rotation 70 Degrees                         [facility] Adult PT Treatment/Exercise - 09/20/20 0001      Posture/Postural Control   Posture/Postural Control Postural limitations    Postural Limitations Rounded Shoulders      Exercises   Exercises Shoulder      Shoulder Exercises: Supine   Protraction Strengthening;Left;20 reps;Weights    Protraction Weight (lbs) 1#    External Rotation AROM;Left;10 reps   Slow to comfort   Internal Rotation AROM;Left;10 reps   10 seconds   Flexion AROM;Left;10 reps   No hold; protract 1st     Shoulder Exercises: Standing   Retraction Strengthening;Both;10 reps   5 seconds shoulder blade pinches   Other Standing Exercises Pendulum: F/B; CW; CCW; R/L    Other Standing Exercises Biceps with supination NO WEIGHTS 10X 3 seconds slow eccentrics      Shoulder Exercises: Pulleys   Flexion Other (comment)  10X 10 seconds   Scaption Other (comment)   10X 10 seconds                 PT Education - 09/20/20 1226    Education Details Reviewed HEP with minor and appropriate progressions.    Person(s) Educated Patient    Methods Explanation;Demonstration;Tactile cues;Verbal cues    Comprehension Verbalized understanding;Returned demonstration;Verbal cues required;Need further instruction            PT Short Term Goals - 09/20/20 1227      PT SHORT TERM GOAL #1   Title I with initial HEP    Time 4    Period Weeks    Status Achieved    Target Date 09/21/20      PT SHORT TERM GOAL #2   Title Report =/> 50% reduction of overall back pain to allow return to normal activities.    Time 12    Period Weeks    Status On-going    Target Date 11/09/20      PT SHORT TERM GOAL #3   Period Weeks    Status On-going    Target Date 11/09/20             PT Long Term  Goals - 09/20/20 1227      PT LONG TERM GOAL #1   Title I with long-term HEP.    Time 12    Period Weeks    Status On-going      PT LONG TERM GOAL #2   Title Deborah Jacobson will report L shoulder pain consistently 0-2/10 on the Numeric Pain Rating Scale.    Baseline 4-5/10    Time 12    Period Weeks    Status On-going      PT LONG TERM GOAL #3   Title Improve FOTO score to 67.    Baseline 46    Time 12    Period Weeks    Status On-going      PT LONG TERM GOAL #4   Title Improve shoulder AROM for flexion to 170; ER to 90; IR to 60 and horizontal adduction to 40 degrees.    Baseline See Objective.    Time 12    Period Weeks    Status On-going      PT LONG TERM GOAL #5   Title Improve L shoulder strength as assessed by improve MMT scores.    Baseline Deferred due to just over 3 weeks post-surgery.    Time 12    Period Weeks    Status On-going                 Plan - 09/20/20 1228    Clinical Impression Statement Deborah Jacobson is sleeping through the night on a more consistent basis.  HEP compliance is very good and objective AROM values are ahead of schedule.  I reminded her to still be selective and conservative with R shoulder use as healing is still occuring.  Continue appropriate progressions with emphasis on AROM and scapular strength until RTC strengthening is appropriate 8-12 weeks post-surgery.    Personal Factors and Comorbidities Comorbidity 3+    Comorbidities HTN, pre- DM, HA's, chronic LBP    Examination-Activity Limitations Reach Overhead;Bend;Caring for Others;Dressing;Hygiene/Grooming;Lift;Sleep    Examination-Participation Restrictions Cleaning;Meal Prep;Laundry    Stability/Clinical Decision Making Stable/Uncomplicated    Rehab Potential Good    PT Frequency 2x / week    PT Duration 12 weeks    PT Treatment/Interventions Patient/family education;Therapeutic activities;Passive range  of motion;Therapeutic exercise;Cryotherapy;Neuromuscular re-education;Manual  techniques;Vasopneumatic Device    PT Next Visit Plan Capsular stretching, PROM and AAROM    PT Home Exercise Plan Access Code: [email or web address].  Access Code: [email or web address]    Consulted and Agree with Plan of Care Patient           Patient will benefit from skilled therapeutic intervention in order to improve the following deficits and impairments:  Decreased range of motion, Obesity, Impaired UE functional use, Pain, Hypomobility, Decreased strength, Decreased activity tolerance, Decreased endurance, Increased edema  Visit Diagnosis: Stiffness of left shoulder, not elsewhere classified  Chronic pain in left shoulder  Muscle weakness (generalized)     Problem List Patient Active Problem List   Diagnosis Date Noted  . Subacromial bursitis of left shoulder joint 08/29/2020  . Tendinopathy of left rotator cuff 08/29/2020  . Morbid obesity with BMI of 45.0-49.9, adult (HCC) 08/09/2020  . Superior glenoid labrum lesion of left shoulder 07/10/2020  . Health maintenance examination 03/23/2019  . Prediabetes 12/24/2018  . Strain of back 24-Dec-2018  . Death of family member Dec 24, 2018  . Back pain 07/31/2018  . Fatigue 05/31/2018  . Chronic low back pain 11/30/2017  . Shifting sleep-work schedule 06/15/2017  . 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 Sleep related headaches 06/15/2017  . Super obese 06/15/2017  . Mood complaints in sleep disorder 06/15/2017  . Plantar fasciitis of left foot 03/01/2014  . Porokeratosis 03/01/2014  . Pain in lower limb 03/01/2014    Cherlyn Cushing PT, MPT 09/20/2020, 12:31 PM  [facility] Physical Therapy [address] [place], [place], [address] Phone: [phone]   Fax:  [phone]  Name: Deborah Jacobson MRN: [id number] Date of Birth: 1980-12-25

## 2020-09-21 ENCOUNTER — Ambulatory Visit (INDEPENDENT_AMBULATORY_CARE_PROVIDER_SITE_OTHER): Payer: 59 | Admitting: Rehabilitative and Restorative Service Providers"

## 2020-09-21 ENCOUNTER — Encounter: Payer: Self-pay | Admitting: Rehabilitative and Restorative Service Providers"

## 2020-09-21 DIAGNOSIS — R293 Abnormal posture: Secondary | ICD-10-CM | POA: Diagnosis not present

## 2020-09-21 DIAGNOSIS — M25612 Stiffness of left shoulder, not elsewhere classified: Secondary | ICD-10-CM | POA: Diagnosis not present

## 2020-09-21 DIAGNOSIS — M6281 Muscle weakness (generalized): Secondary | ICD-10-CM

## 2020-09-21 DIAGNOSIS — M25512 Pain in left shoulder: Secondary | ICD-10-CM

## 2020-09-21 DIAGNOSIS — G8929 Other chronic pain: Secondary | ICD-10-CM

## 2020-09-21 NOTE — Patient Instructions (Signed)
OK for PT 1X/week due to excellent progress.

## 2020-09-21 NOTE — Therapy (Signed)
[facility] Physical Therapy [address] [place], [place], [address] Phone: [phone]   Fax:  [phone]  Physical Therapy Treatment  Patient Details  Name: Deborah Jacobson MRN: [id number] Date of Birth: 1980-12-02 Referring Provider (PT): Tarry Kos MD   Encounter Date: 09/21/2020   PT End of Session - 09/21/20 1221    Visit Number 4    Number of Visits 12    Date for PT Re-Evaluation 11/09/20    Authorization Type [employer]    PT Start Time 1144    PT Stop Time 1224    PT Time Calculation (min) 40 min    Activity Tolerance Patient tolerated treatment well;No increased pain    Behavior During Therapy WFL for tasks assessed/performed           Past Medical History:  Diagnosis Date  . Acid reflux   . Diabetes in pregnancy   . Hypertension   . Seasonal allergies     Past Surgical History:  Procedure Laterality Date  . CESAREAN SECTION    . SHOULDER ARTHROSCOPY WITH BICEPS TENDON REPAIR Left 08/29/2020   Procedure: LEFT SHOULDER ARTHROSCOPY WITH BICEPS TENODESIS;  Surgeon: Tarry Kos, MD;  Location: [facility];  Service: Orthopedics;  Laterality: Left;    There were no vitals filed for this visit.   Subjective Assessment - 09/21/20 1216    Subjective No pain or soreness from yesterday's visit.    Pertinent History Limited use of L UE with reaching and overhead function.  She has her husband assist with donning her bra and washing.    Limitations House hold activities    How long can you sit comfortably? varies, can tolerate 15' if using heat at the same time.    How long can you stand comfortably? 53'    Diagnostic tests MRI of shoulder - partial slap tear.    Patient Stated Goals Get shoulder healthy for return to normal activities including work.    Currently in Pain? Yes    Pain Score 2     Pain Location Shoulder    Pain Orientation Left    Pain Descriptors / Indicators Aching;Sore    Pain Type Surgical pain      Pain Onset 1 to 4 weeks ago    Pain Frequency Constant    Aggravating Factors  Overuse and awkward positions (like getting dressed)    Pain Relieving Factors Ice and exercises    Effect of Pain on Daily Activities Out of work    Multiple Pain Sites No                             [facility] Adult PT Treatment/Exercise - 09/21/20 0001      Posture/Postural Control   Posture/Postural Control Postural limitations    Postural Limitations Rounded Shoulders      Exercises   Exercises Shoulder      Shoulder Exercises: Supine   Protraction Strengthening;Left;20 reps;Weights    Protraction Weight (lbs) 1#    External Rotation AROM;Left;10 reps   Slow to comfort   Internal Rotation AROM;Left;10 reps   10 seconds   Flexion AROM;Left;10 reps   5 seconds; protract 1st     Shoulder Exercises: Standing   Retraction Strengthening;Both;10 reps   5 seconds shoulder blade pinches   Other Standing Exercises Pendulum: F/B; CW; CCW; R/L    Other Standing Exercises Biceps with supination NO WEIGHTS 10X 3 seconds  slow eccentrics      Shoulder Exercises: Pulleys   Flexion Other (comment)   10X 10 seconds   Scaption Other (comment)   10X 10 seconds                 PT Education - 09/21/20 1220    Education Details Reviewed HEP.    Person(s) Educated Patient    Methods Explanation;Demonstration;Tactile cues;Verbal cues    Comprehension Verbalized understanding;Returned demonstration;Verbal cues required;Need further instruction            PT Short Term Goals - 09/20/20 1227      PT SHORT TERM GOAL #1   Title I with initial HEP    Time 4    Period Weeks    Status Achieved    Target Date 09/21/20      PT SHORT TERM GOAL #2   Title Report =/> 50% reduction of overall back pain to allow return to normal activities.    Time 12    Period Weeks    Status On-going    Target Date 11/09/20      PT SHORT TERM GOAL #3   Period Weeks    Status On-going    Target Date  11/09/20             PT Long Term Goals - 09/21/20 1221      PT LONG TERM GOAL #1   Title I with long-term HEP.    Time 12    Period Weeks    Status On-going      PT LONG TERM GOAL #2   Title Deborah Jacobson will report L shoulder pain consistently 0-2/10 on the Numeric Pain Rating Scale.    Baseline 4-5/10    Time 12    Period Weeks    Status On-going      PT LONG TERM GOAL #3   Title Improve FOTO score to 67.    Baseline 46    Time 12    Period Weeks    Status On-going      PT LONG TERM GOAL #4   Title Improve shoulder AROM for flexion to 170; ER to 90; IR to 60 and horizontal adduction to 40 degrees.    Baseline See Objective.    Time 12    Period Weeks    Status On-going      PT LONG TERM GOAL #5   Title Improve L shoulder strength as assessed by improve MMT scores.    Baseline Deferred due to just over 3 weeks post-surgery.    Time 12    Period Weeks    Status On-going                 Plan - 09/21/20 1222    Clinical Impression Statement Deborah Jacobson continues to make progress with her self-reported function.  Due to excellent progress and the need to be conservative with strength progressions at this stage post-surgery, she will decrease supervised PT visits to 1X/week over the next 2 weeks before starting strength work and returning to 2X/week.    Personal Factors and Comorbidities Comorbidity 3+    Comorbidities HTN, pre- DM, HA's, chronic LBP    Examination-Activity Limitations Reach Overhead;Bend;Caring for Others;Dressing;Hygiene/Grooming;Lift;Sleep    Examination-Participation Restrictions Cleaning;Meal Prep;Laundry    Stability/Clinical Decision Making Stable/Uncomplicated    Rehab Potential Good    PT Frequency 2x / week    PT Duration 12 weeks    PT Treatment/Interventions Patient/family education;Therapeutic activities;Passive range of motion;Therapeutic exercise;Cryotherapy;Neuromuscular  re-education;Manual techniques;Vasopneumatic Device    PT Next  Visit Plan Capsular stretching, PROM and AAROM    PT Home Exercise Plan Access Code: [email or web address].  Access Code: [email or web address]    Consulted and Agree with Plan of Care Patient           Patient will benefit from skilled therapeutic intervention in order to improve the following deficits and impairments:  Decreased range of motion, Obesity, Impaired UE functional use, Pain, Hypomobility, Decreased strength, Decreased activity tolerance, Decreased endurance, Increased edema  Visit Diagnosis: Stiffness of left shoulder, not elsewhere classified  Chronic pain in left shoulder  Muscle weakness (generalized)  Posture abnormality     Problem List Patient Active Problem List   Diagnosis Date Noted  . Subacromial bursitis of left shoulder joint 08/29/2020  . Tendinopathy of left rotator cuff 08/29/2020  . Morbid obesity with BMI of 45.0-49.9, adult (HCC) 08/09/2020  . Superior glenoid labrum lesion of left shoulder 07/10/2020  . Health maintenance examination 03/23/2019  . Prediabetes 12/22/18  . Strain of back 12-22-2018  . Death of family member 22-Dec-2018  . Back pain 07/31/2018  . Fatigue 05/31/2018  . Chronic low back pain 11/30/2017  . Shifting sleep-work schedule 06/15/2017  . 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 Sleep related headaches 06/15/2017  . Super obese 06/15/2017  . Mood complaints in sleep disorder 06/15/2017  . Plantar fasciitis of left foot 03/01/2014  . Porokeratosis 03/01/2014  . Pain in lower limb 03/01/2014    Cherlyn Cushing PT, MPT 09/21/2020, 12:27 PM  [facility] Physical Therapy [address] [place], [place], [address] Phone: [phone]   Fax:  [phone]  Name: Deborah Jacobson MRN: [id number] Date of Birth: 09/24/1981

## 2020-09-26 ENCOUNTER — Ambulatory Visit
Admission: RE | Admit: 2020-09-26 | Discharge: 2020-09-26 | Disposition: A | Discharge status: Home / Self Care | Payer: 59 | Source: Ambulatory Visit | Attending: Nurse Practitioner | Admitting: Nurse Practitioner

## 2020-09-26 DIAGNOSIS — N9489 Other specified conditions associated with female genital organs and menstrual cycle: Secondary | ICD-10-CM

## 2020-09-27 ENCOUNTER — Ambulatory Visit (INDEPENDENT_AMBULATORY_CARE_PROVIDER_SITE_OTHER): Payer: 59 | Admitting: Rehabilitative and Restorative Service Providers"

## 2020-09-27 ENCOUNTER — Other Ambulatory Visit: Payer: Self-pay

## 2020-09-27 ENCOUNTER — Encounter: Payer: Self-pay | Admitting: Rehabilitative and Restorative Service Providers"

## 2020-09-27 DIAGNOSIS — G8929 Other chronic pain: Secondary | ICD-10-CM

## 2020-09-27 DIAGNOSIS — M6281 Muscle weakness (generalized): Secondary | ICD-10-CM

## 2020-09-27 DIAGNOSIS — M25512 Pain in left shoulder: Secondary | ICD-10-CM

## 2020-09-27 DIAGNOSIS — M25612 Stiffness of left shoulder, not elsewhere classified: Secondary | ICD-10-CM

## 2020-09-27 NOTE — Therapy (Signed)
[facility] Physical Therapy [address] [place], [place], [address] Phone: [phone]   Fax:  [phone]  Physical Therapy Treatment/Reassessment  Patient Details  Name: Deborah Jacobson MRN: [id number] Date of Birth: 1981-09-03 Referring Provider (PT): Tarry Kos MD   Encounter Date: 09/27/2020   PT End of Session - 09/27/20 1222    Visit Number 5    Number of Visits 12    Date for PT Re-Evaluation 11/09/20    Authorization Type [employer]    PT Start Time 1137    PT Stop Time 1230    PT Time Calculation (min) 53 min    Activity Tolerance Patient tolerated treatment well;No increased pain    Behavior During Therapy WFL for tasks assessed/performed           Past Medical History:  Diagnosis Date  . Acid reflux   . Diabetes in pregnancy   . Hypertension   . Seasonal allergies     Past Surgical History:  Procedure Laterality Date  . CESAREAN SECTION    . SHOULDER ARTHROSCOPY WITH BICEPS TENDON REPAIR Left 08/29/2020   Procedure: LEFT SHOULDER ARTHROSCOPY WITH BICEPS TENODESIS;  Surgeon: Tarry Kos, MD;  Location: [facility];  Service: Orthopedics;  Laterality: Left;    There were no vitals filed for this visit.   Subjective Assessment - 09/27/20 1209    Subjective Deborah Jacobson is happy with her early progress.    Pertinent History Improving L UE function.  Still limited with reaching, overhead function and lifting.    Limitations House hold activities    How long can you sit comfortably? 2 hours (was 15 minutes)    How long can you stand comfortably? No problem (was 15 minutes)    How long can you walk comfortably? Mile +    Diagnostic tests MRI of shoulder - partial slap tear.    Patient Stated Goals Get shoulder healthy for return to normal activities including work.    Currently in Pain? Yes    Pain Score 2     Pain Location Shoulder    Pain Orientation Left    Pain Descriptors / Indicators Aching;Sore    Pain Type  Surgical pain;Chronic pain    Pain Onset More than a month ago    Pain Frequency Intermittent    Aggravating Factors  Overuse, sleeping on the L side, reaching and overhead function    Pain Relieving Factors Modalities    Effect of Pain on Daily Activities Out of work, reaching and overhead function affected    Multiple Pain Sites No              [facility] PT Assessment - 09/27/20 0001      Observation/Other Assessments   Focus on Therapeutic Outcomes (FOTO)  55      AROM   Right/Left Shoulder Left;Right    Left Shoulder Flexion 140 Degrees    Left Shoulder Internal Rotation 55 Degrees    Left Shoulder External Rotation 80 Degrees    Left Shoulder Horizontal ADduction 20 Degrees      PROM   Right/Left Shoulder Left;Right                         [facility] Adult PT Treatment/Exercise - 09/27/20 0001      Exercises   Exercises Shoulder      Shoulder Exercises: Supine   Protraction Strengthening;Left;20 reps;Weights    Protraction Weight (lbs) 1#  External Rotation AROM;Left;10 reps   Slow to comfort   Internal Rotation AROM;Left;10 reps   10 seconds   Flexion AROM;Left;10 reps   10 seconds; protract 1st     Shoulder Exercises: Standing   Retraction Strengthening;Both;10 reps   5 seconds shoulder blade pinches   Other Standing Exercises Pendulum: F/B; CW; CCW    Other Standing Exercises Biceps with supination NO WEIGHTS 10X 3 seconds slow eccentrics      Shoulder Exercises: Pulleys   Flexion Other (comment)   10X 10 seconds   Scaption Other (comment)   10X 10 seconds                 PT Education - 09/27/20 1218    Education Details Reviewed HEP and discussed the importance of being patient and avoiding overuse, extreme end ROM and lifting heavy weights.    Person(s) Educated Patient    Methods Explanation;Demonstration;Tactile cues;Verbal cues    Comprehension Verbal cues required;Returned demonstration;Need further instruction;Verbalized  understanding            PT Short Term Goals - 09/27/20 1219      PT SHORT TERM GOAL #1   Title I with initial HEP    Time 4    Period Weeks    Status Achieved    Target Date 09/21/20      PT SHORT TERM GOAL #2   Title Report =/> 50% reduction of overall back pain to allow return to normal activities.    Time 12    Period Weeks    Status Achieved    Target Date 11/09/20      PT SHORT TERM GOAL #3   Period Weeks    Status Achieved    Target Date 11/09/20             PT Long Term Goals - 09/27/20 1219      PT LONG TERM GOAL #1   Title I with long-term HEP.    Time 12    Period Weeks    Status On-going      PT LONG TERM GOAL #2   Title Deborah Jacobson will report L shoulder pain consistently 0-2/10 on the Numeric Pain Rating Scale.    Baseline 4-5/10    Time 12    Period Weeks    Status On-going      PT LONG TERM GOAL #3   Title Improve FOTO score to 67.    Baseline 46    Time 12    Period Weeks    Status On-going      PT LONG TERM GOAL #4   Title Improve shoulder AROM for flexion to 170; ER to 90; IR to 60 and horizontal adduction to 40 degrees.    Baseline Flexion 140; ER 80; IR 55 and HA 20 degrees.    Time 12    Period Weeks    Status On-going      PT LONG TERM GOAL #5   Title Improve L shoulder strength as assessed by improve MMT scores.    Baseline Deferred due to just over 3 weeks post-surgery.    Time 12    Period Weeks    Status On-going                 Plan - 09/27/20 1223    Clinical Impression Statement Deborah Jacobson is sleeping much better and AROM is ahead of schedule.  Reaching and overhead function are limited as would be expected at this point post-surgery.  AROM remains the primary emphasis at this time with increased strengthening scheduled for after Thanksgiving.    Personal Factors and Comorbidities Comorbidity 3+    Comorbidities HTN, pre- DM, HA's, chronic LBP    Examination-Activity Limitations Reach Overhead;Bend;Caring for  Others;Dressing;Hygiene/Grooming;Lift;Sleep    Examination-Participation Restrictions Cleaning;Meal Prep;Laundry    Stability/Clinical Decision Making Stable/Uncomplicated    Rehab Potential Good    PT Frequency 2x / week    PT Duration 8 weeks    PT Treatment/Interventions Patient/family education;Therapeutic activities;Passive range of motion;Therapeutic exercise;Cryotherapy;Neuromuscular re-education;Manual techniques;Vasopneumatic Device    PT Next Visit Plan Capsular stretching, PROM and AAROM    PT Home Exercise Plan Access Code: [email or web address].  Access Code: [email or web address]    Consulted and Agree with Plan of Care Patient           Patient will benefit from skilled therapeutic intervention in order to improve the following deficits and impairments:  Decreased range of motion, Obesity, Impaired UE functional use, Pain, Hypomobility, Decreased strength, Decreased activity tolerance, Decreased endurance, Increased edema  Visit Diagnosis: Muscle weakness (generalized)  Stiffness of left shoulder, not elsewhere classified  Chronic pain in left shoulder     Problem List Patient Active Problem List   Diagnosis Date Noted  . Subacromial bursitis of left shoulder joint 08/29/2020  . Tendinopathy of left rotator cuff 08/29/2020  . Morbid obesity with BMI of 45.0-49.9, adult (HCC) 08/09/2020  . Superior glenoid labrum lesion of left shoulder 07/10/2020  . Health maintenance examination 03/23/2019  . Prediabetes 2018-12-24  . Strain of back 2018-12-24  . Death of family member 2018/12/24  . Back pain 07/31/2018  . Fatigue 05/31/2018  . Chronic low back pain 11/30/2017  . Shifting sleep-work schedule 06/15/2017  . 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 Sleep related headaches 06/15/2017  . Super obese 06/15/2017  . Mood complaints in sleep disorder 06/15/2017  . Plantar fasciitis of left foot 03/01/2014  . Porokeratosis 03/01/2014  . Pain in lower limb 03/01/2014    Cherlyn Cushing PT,  MPT 09/27/2020, 12:31 PM  [facility] Physical Therapy [address] [place], [place], [address] Phone: [phone]   Fax:  [phone]  Name: Deborah Jacobson MRN: [id number] Date of Birth: 07/18/1981

## 2020-09-28 ENCOUNTER — Encounter: Payer: 59 | Admitting: Rehabilitative and Restorative Service Providers"

## 2020-10-03 ENCOUNTER — Ambulatory Visit (INDEPENDENT_AMBULATORY_CARE_PROVIDER_SITE_OTHER): Payer: 59 | Admitting: Rehabilitative and Restorative Service Providers"

## 2020-10-03 ENCOUNTER — Encounter: Payer: Self-pay | Admitting: Rehabilitative and Restorative Service Providers"

## 2020-10-03 ENCOUNTER — Other Ambulatory Visit: Payer: Self-pay

## 2020-10-03 ENCOUNTER — Ambulatory Visit (INDEPENDENT_AMBULATORY_CARE_PROVIDER_SITE_OTHER): Payer: 59 | Admitting: Physician Assistant

## 2020-10-03 DIAGNOSIS — M25512 Pain in left shoulder: Secondary | ICD-10-CM

## 2020-10-03 DIAGNOSIS — M6281 Muscle weakness (generalized): Secondary | ICD-10-CM

## 2020-10-03 DIAGNOSIS — Z9889 Other specified postprocedural states: Secondary | ICD-10-CM

## 2020-10-03 DIAGNOSIS — M25612 Stiffness of left shoulder, not elsewhere classified: Secondary | ICD-10-CM | POA: Diagnosis not present

## 2020-10-03 DIAGNOSIS — G8929 Other chronic pain: Secondary | ICD-10-CM

## 2020-10-03 NOTE — Therapy (Signed)
[facility] Physical Therapy [address] [place], [place], [address] Phone: [phone]   Fax:  [phone]  Physical Therapy Treatment  Patient Details  Name: Deborah Jacobson MRN: [id number] Date of Birth: 02-Jan-1981 Referring Provider (PT): Tarry Kos MD   Encounter Date: 10/03/2020   PT End of Session - 10/03/20 1715    Visit Number 6    Number of Visits 12    Date for PT Re-Evaluation 11/09/20    Authorization Type [employer]    PT Start Time 1437    PT Stop Time 1515    PT Time Calculation (min) 38 min    Activity Tolerance Patient tolerated treatment well;No increased pain    Behavior During Therapy WFL for tasks assessed/performed           Past Medical History:  Diagnosis Date  . Acid reflux   . Diabetes in pregnancy   . Hypertension   . Seasonal allergies     Past Surgical History:  Procedure Laterality Date  . CESAREAN SECTION    . SHOULDER ARTHROSCOPY WITH BICEPS TENDON REPAIR Left 08/29/2020   Procedure: LEFT SHOULDER ARTHROSCOPY WITH BICEPS TENODESIS;  Surgeon: Tarry Kos, MD;  Location: [facility];  Service: Orthopedics;  Laterality: Left;    There were no vitals filed for this visit.   Subjective Assessment - 10/03/20 1712    Subjective Terris notes using her arm more comfortably for light ADLs around the house.    Pertinent History Improving L UE function.  Still limited with reaching, overhead function and lifting.    Limitations House hold activities    How long can you sit comfortably? 2 hours (was 15 minutes)    How long can you stand comfortably? No problem (was 15 minutes)    How long can you walk comfortably? Mile +    Diagnostic tests MRI of shoulder - partial slap tear.    Patient Stated Goals Get shoulder healthy for return to normal activities including work.    Currently in Pain? Yes    Pain Score 3     Pain Location Shoulder    Pain Orientation Left    Pain Descriptors / Indicators  Aching;Sore    Pain Type Chronic pain;Surgical pain    Pain Onset More than a month ago    Pain Frequency Intermittent    Aggravating Factors  Overuse and sleeping directly on the L shoulder    Pain Relieving Factors Exercise and heat/ice    Effect of Pain on Daily Activities Out of work.  Reaching and overhead function are limited    Multiple Pain Sites No                             [facility] Adult PT Treatment/Exercise - 10/03/20 0001      Exercises   Exercises Shoulder      Shoulder Exercises: Supine   Protraction Strengthening;Left;20 reps;Weights    Protraction Weight (lbs) 2#    External Rotation AROM;Left;10 reps   Slow to comfort   Internal Rotation AROM;Left;10 reps   10 seconds   Flexion AROM;Left;10 reps   10 seconds; protract 1st     Shoulder Exercises: Sidelying   External Rotation Other (comment)   Next visit 0#     Shoulder Exercises: Standing   Retraction Strengthening;Both;10 reps   5 seconds shoulder blade pinches   Diagonals Other (comment)   Posterior capsule stretch (Horizontal adduction)  10X 10 sec   Other Standing Exercises Pendulum: F/B; CW; CCW    Other Standing Exercises Biceps with supination NO WEIGHTS 10X 3 seconds slow eccentrics      Shoulder Exercises: Pulleys   Flexion Other (comment)   10X 10 seconds   Scaption Other (comment)   10X 10 seconds                 PT Education - 10/03/20 1713    Education Details Reviewed and progressed HEP (posterior capsule stretching and scapular strengthening).    Person(s) Educated Patient    Methods Explanation;Demonstration;Verbal cues;Tactile cues;Handout    Comprehension Verbal cues required;Need further instruction;Returned demonstration;Verbalized understanding;Tactile cues required            PT Short Term Goals - 09/27/20 1219      PT SHORT TERM GOAL #1   Title I with initial HEP    Time 4    Period Weeks    Status Achieved    Target Date 09/21/20      PT SHORT  TERM GOAL #2   Title Report =/> 50% reduction of overall back pain to allow return to normal activities.    Time 12    Period Weeks    Status Achieved    Target Date 11/09/20      PT SHORT TERM GOAL #3   Period Weeks    Status Achieved    Target Date 11/09/20             PT Long Term Goals - 10/03/20 1714      PT LONG TERM GOAL #1   Title I with long-term HEP.    Time 12    Period Weeks    Status On-going      PT LONG TERM GOAL #2   Title Topaz will report L shoulder pain consistently 0-2/10 on the Numeric Pain Rating Scale.    Baseline 4-5/10    Time 12    Period Weeks    Status On-going      PT LONG TERM GOAL #3   Title Improve FOTO score to 67.    Baseline 46    Time 12    Period Weeks    Status On-going      PT LONG TERM GOAL #4   Title Improve shoulder AROM for flexion to 170; ER to 90; IR to 60 and horizontal adduction to 40 degrees.    Baseline Flexion 140; ER 80; IR 55 and HA 20 degrees.    Time 12    Period Weeks    Status On-going      PT LONG TERM GOAL #5   Title Improve L shoulder strength as assessed by improve MMT scores.    Baseline Deferred due to just over 3 weeks post-surgery.    Time 12    Period Weeks    Status On-going                 Plan - 10/03/20 1506    Clinical Impression Statement Edythe continues to note AROM gains and less apprehension and pain with light ADLs.  Added posterior capsule stretching and increased resistance to serratus anterior strengthening.  OK to begin easy rotator cuff strength work next week to continue to improve Rio's L shoulder function.    Personal Factors and Comorbidities Comorbidity 3+    Comorbidities HTN, pre- DM, HA's, chronic LBP    Examination-Activity Limitations Reach Overhead;Bend;Caring for Others;Dressing;Hygiene/Grooming;Lift;Sleep    Examination-Participation Restrictions Cleaning;Meal  Prep;Laundry    Stability/Clinical Decision Making Stable/Uncomplicated    Clinical  Decision Making Low    Rehab Potential Good    PT Frequency 2x / week    PT Duration 8 weeks    PT Treatment/Interventions Patient/family education;Therapeutic activities;Passive range of motion;Therapeutic exercise;Cryotherapy;Neuromuscular re-education;Manual techniques;Vasopneumatic Device    PT Next Visit Plan Capsular stretching, PROM and AAROM    PT Home Exercise Plan Access Code: [email or web address].  Access Code: [email or web address]    Consulted and Agree with Plan of Care Patient           Patient will benefit from skilled therapeutic intervention in order to improve the following deficits and impairments:  Decreased range of motion, Obesity, Impaired UE functional use, Pain, Hypomobility, Decreased strength, Decreased activity tolerance, Decreased endurance, Increased edema  Visit Diagnosis: Muscle weakness (generalized)  Stiffness of left shoulder, not elsewhere classified  Chronic pain in left shoulder     Problem List Patient Active Problem List   Diagnosis Date Noted  . Subacromial bursitis of left shoulder joint 08/29/2020  . Tendinopathy of left rotator cuff 08/29/2020  . Morbid obesity with BMI of 45.0-49.9, adult (HCC) 08/09/2020  . Superior glenoid labrum lesion of left shoulder 07/10/2020  . Health maintenance examination 03/23/2019  . Prediabetes 2018-12-22  . Strain of back December 22, 2018  . Death of family member 12/22/18  . Back pain 07/31/2018  . Fatigue 05/31/2018  . Chronic low back pain 11/30/2017  . Shifting sleep-work schedule 06/15/2017  . 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 Sleep related headaches 06/15/2017  . Super obese 06/15/2017  . Mood complaints in sleep disorder 06/15/2017  . Plantar fasciitis of left foot 03/01/2014  . Porokeratosis 03/01/2014  . Pain in lower limb 03/01/2014    Cherlyn Cushing PT, MPT 10/03/2020, 5:18 PM  [facility] Physical Therapy [address] [place], [place], [address] Phone: [phone]   Fax:   [phone]  Name: EMILLEE TALSMA MRN: [id number] Date of Birth: [birth year]

## 2020-10-03 NOTE — Progress Notes (Signed)
   Post-Op Visit Note   Patient: Deborah Jacobson           Date of Birth: 1981-06-12           MRN: [id number] Visit Date: 10/03/2020 PCP: Arnette Felts, FNP   Assessment & Plan:  Chief Complaint: No chief complaint on file.  Visit Diagnoses:  1. S/P arthroscopy of left shoulder     Plan: Patient is a pleasant 39 year old female comes in today 6 weeks status post left shoulder arthroscopic biceps tenodesis.  She has been doing well.  She has been in physical therapy making steady progress.  She has minimal pain.  Semination of the left shoulder reveals approximately 100 degrees of forward flexion.  She can internally rotate to her back pocket.  At this point, she will continue with physical therapy and may increase activity as tolerated.  She can discontinue her sling.  She will follow up with [place] in 6 weeks time for recheck.  Call with concerns or questions in the meantime.  Follow-Up Instructions: Return in about 6 weeks (around 11/14/2020).   Orders:  No orders of the defined types were placed in this encounter.  No orders of the defined types were placed in this encounter.   Imaging: No new imaging  PMFS History: Patient Active Problem List   Diagnosis Date Noted  . Subacromial bursitis of left shoulder joint 08/29/2020  . Tendinopathy of left rotator cuff 08/29/2020  . Morbid obesity with BMI of 45.0-49.9, adult (HCC) 08/09/2020  . Superior glenoid labrum lesion of left shoulder 07/10/2020  . Health maintenance examination 03/23/2019  . Prediabetes December 09, 2018  . Strain of back 09-Dec-2018  . Death of family member 12/09/2018  . Back pain 07/31/2018  . Fatigue 05/31/2018  . Chronic low back pain 11/30/2017  . Shifting sleep-work schedule 06/15/2017  . 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 Sleep related headaches 06/15/2017  . Super obese 06/15/2017  . Mood complaints in sleep disorder 06/15/2017  . Plantar fasciitis of left foot 03/01/2014  . Porokeratosis 03/01/2014   . Pain in lower limb 03/01/2014   Past Medical History:  Diagnosis Date  . Acid reflux   . Diabetes in pregnancy   . Hypertension   . Seasonal allergies     Family History  Problem Relation Age of Onset  . Diabetes Mother   . Liver disease Mother   . Hypertension Mother     Past Surgical History:  Procedure Laterality Date  . CESAREAN SECTION    . SHOULDER ARTHROSCOPY WITH BICEPS TENDON REPAIR Left 08/29/2020   Procedure: LEFT SHOULDER ARTHROSCOPY WITH BICEPS TENODESIS;  Surgeon: Tarry Kos, MD;  Location: [facility];  Service: Orthopedics;  Laterality: Left;   Social History   Occupational History  . Not on file  Tobacco Use  . Smoking status: Former Games developer  . Smokeless tobacco: Never Used  Vaping Use  . Vaping Use: Never used  Substance and Sexual Activity  . Alcohol use: No  . Drug use: No  . Sexual activity: Yes    Birth control/protection: None

## 2020-10-07 ENCOUNTER — Encounter: Payer: Self-pay | Admitting: Orthopaedic Surgery

## 2020-10-08 ENCOUNTER — Other Ambulatory Visit: Payer: Self-pay | Admitting: Physician Assistant

## 2020-10-08 MED ORDER — TRAMADOL HCL 50 MG PO TABS
50.0000 mg | ORAL_TABLET | Freq: Three times a day (TID) | ORAL | 0 refills | Status: DC | PRN
Start: 2020-10-08 — End: 2020-11-05

## 2020-10-08 NOTE — Telephone Encounter (Signed)
For some reason I am not able to erx from the hospital right now.  Can you guys call in tramadol 50mg  1-2 tid prn pain #30 no refills

## 2020-10-10 ENCOUNTER — Other Ambulatory Visit: Payer: Self-pay

## 2020-10-10 ENCOUNTER — Ambulatory Visit (INDEPENDENT_AMBULATORY_CARE_PROVIDER_SITE_OTHER): Payer: 59 | Admitting: Rehabilitative and Restorative Service Providers"

## 2020-10-10 ENCOUNTER — Encounter: Payer: Self-pay | Admitting: Rehabilitative and Restorative Service Providers"

## 2020-10-10 DIAGNOSIS — M6281 Muscle weakness (generalized): Secondary | ICD-10-CM | POA: Diagnosis not present

## 2020-10-10 DIAGNOSIS — M25512 Pain in left shoulder: Secondary | ICD-10-CM | POA: Diagnosis not present

## 2020-10-10 DIAGNOSIS — M25612 Stiffness of left shoulder, not elsewhere classified: Secondary | ICD-10-CM

## 2020-10-10 DIAGNOSIS — G8929 Other chronic pain: Secondary | ICD-10-CM

## 2020-10-10 NOTE — Therapy (Signed)
[facility] [address] [place], [place], [address] Phone: [phone]   Fax:  [phone]  Physical Therapy Treatment  Patient Details  Name: Deborah Jacobson MRN: [id number] Date of Birth: 09-Jan-1981 Referring Provider (PT): Tarry Kos MD   Encounter Date: 10/10/2020   PT End of Session - 10/10/20 1417    Visit Number 7    Number of Visits 12    Date for PT Re-Evaluation 11/09/20    Authorization Type [employer]    PT Start Time 1331    PT Stop Time 1415    PT Time Calculation (min) 44 min    Activity Tolerance Patient tolerated treatment well;No increased pain    Behavior During Therapy WFL for tasks assessed/performed           Past Medical History:  Diagnosis Date  . Acid reflux   . Diabetes in pregnancy   . Hypertension   . Seasonal allergies     Past Surgical History:  Procedure Laterality Date  . CESAREAN SECTION    . SHOULDER ARTHROSCOPY WITH BICEPS TENDON REPAIR Left 08/29/2020   Procedure: LEFT SHOULDER ARTHROSCOPY WITH BICEPS TENODESIS;  Surgeon: Tarry Kos, MD;  Location: [facility];  Service: Orthopedics;  Laterality: Left;    There were no vitals filed for this visit.   Subjective Assessment - 10/10/20 1412    Subjective [name] asked for a refill of her pain medication.  Sleep is still doing well with most pain/ache during the day.    Pertinent History Improving L UE function.  Still limited with reaching, overhead function and lifting.    Limitations House hold activities    How long can you sit comfortably? 2 hours (was 15 minutes)    How long can you stand comfortably? No problem (was 15 minutes)    How long can you walk comfortably? Mile +    Diagnostic tests MRI of shoulder - partial slap tear.    Patient Stated Goals Get shoulder healthy for return to normal activities including work.    Currently in Pain? Yes    Pain Score 4     Pain Location Shoulder    Pain Orientation Left      Pain Descriptors / Indicators Aching;Sore    Pain Type Chronic pain;Surgical pain    Pain Onset More than a month ago    Pain Frequency Intermittent    Aggravating Factors  L shoulder use    Pain Relieving Factors Exercise and modalities    Effect of Pain on Daily Activities Out of work.    Multiple Pain Sites No              [facility] PT Assessment - 10/10/20 0001      AROM   Right/Left Shoulder Left    Left Shoulder Flexion 150 Degrees    Left Shoulder Internal Rotation 70 Degrees    Left Shoulder External Rotation 70 Degrees    Left Shoulder Horizontal ADduction 30 Degrees                         [facility] Adult PT Treatment/Exercise - 10/10/20 0001      Therapeutic Activites    Therapeutic Activities Other Therapeutic Activities   Reaching 3 10X each (overhead, behind head, behind back)     Exercises   Exercises Shoulder      Shoulder Exercises: Supine   Protraction Strengthening;Left;20 reps;Weights    Protraction Weight (lbs)  3#    External Rotation AROM;Left;10 reps   Slow to comfort   Internal Rotation AROM;Left;10 reps   10 seconds   Flexion AROM;Left;10 reps   10 seconds; protract 1st     Shoulder Exercises: Sidelying   External Rotation Strengthening;Left;10 reps   2 sets 0#     Shoulder Exercises: Standing   Retraction Strengthening;Both;10 reps   5 seconds shoulder blade pinches   Diagonals Other (comment)   Posterior capsule stretch (Horizontal adduction) 10X 10 sec   Other Standing Exercises If needed    Other Standing Exercises Biceps with supination 1# 10X 3 seconds slow eccentrics      Shoulder Exercises: Pulleys   Flexion Other (comment)   If needed   Scaption Other (comment)   If needed                 PT Education - 10/10/20 1415    Education Details Reviewed and progressed HEP (side lie ER and thumb up the back stretch)    Person(s) Educated Patient    Methods Explanation;Demonstration;Tactile cues;Verbal cues;Handout     Comprehension Verbal cues required;Need further instruction;Returned demonstration;Verbalized understanding;Tactile cues required            PT Short Term Goals - 09/27/20 1219      PT SHORT TERM GOAL #1   Title I with initial HEP    Time 4    Period Weeks    Status Achieved    Target Date 09/21/20      PT SHORT TERM GOAL #2   Title Report =/> 50% reduction of overall back pain to allow return to normal activities.    Time 12    Period Weeks    Status Achieved    Target Date 11/09/20      PT SHORT TERM GOAL #3   Period Weeks    Status Achieved    Target Date 11/09/20             PT Long Term Goals - 10/10/20 1415      PT LONG TERM GOAL #1   Title I with long-term HEP.    Time 12    Period Weeks    Status On-going      PT LONG TERM GOAL #2   Title [name] will report L shoulder pain consistently 0-2/10 on the Numeric Pain Rating Scale.    Baseline 4-5/10    Time 12    Period Weeks    Status On-going      PT LONG TERM GOAL #3   Title Improve FOTO score to 67.    Baseline 46 at eval, 55 at reassessment    Time 12    Period Weeks    Status On-going      PT LONG TERM GOAL #4   Title Improve shoulder AROM for flexion to 170; ER to 90; IR to 60 and horizontal adduction to 40 degrees.    Baseline Flexion 150; ER 70; IR 70 and HA 30 degrees.    Time 12    Period Weeks    Status On-going      PT LONG TERM GOAL #5   Title Improve L shoulder strength as assessed by improve MMT scores.    Baseline Deferred due to just over 3 weeks post-surgery.    Time 12    Period Weeks    Status On-going                 Plan - 10/10/20  1417    Clinical Impression Statement Danaria continues to make objective progress towards long-term goals.  She is sleeping well and her shoulder does not bother her at night unless she rolls on the L side.  Increased confidence and use has led to some increased soreness and using medications for pain during the day.  No  hiking/shrug is evident with reaching at ches level and only mild hike/shrug with overhead function.  Continue appropriate strength progressions to return Adessa to work.    Personal Factors and Comorbidities Comorbidity 3+    Comorbidities HTN, pre- DM, HA's, chronic LBP    Examination-Activity Limitations Reach Overhead;Bend;Caring for Others;Dressing;Hygiene/Grooming;Lift;Sleep    Examination-Participation Restrictions Cleaning;Meal Prep;Laundry    Stability/Clinical Decision Making Stable/Uncomplicated    Rehab Potential Good    PT Frequency 2x / week    PT Duration 8 weeks    PT Treatment/Interventions Patient/family education;Therapeutic activities;Passive range of motion;Therapeutic exercise;Cryotherapy;Neuromuscular re-education;Manual techniques;Vasopneumatic Device    PT Next Visit Plan Capsular stretching, PROM and AAROM    PT Home Exercise Plan Access Code: [email or web address].  Access Code: [email or web address]    Consulted and Agree with Plan of Care Patient           Patient will benefit from skilled therapeutic intervention in order to improve the following deficits and impairments:  Decreased range of motion, Obesity, Impaired UE functional use, Pain, Hypomobility, Decreased strength, Decreased activity tolerance, Decreased endurance, Increased edema  Visit Diagnosis: Muscle weakness (generalized)  Stiffness of left shoulder, not elsewhere classified  Chronic pain in left shoulder     Problem List Patient Active Problem List   Diagnosis Date Noted  . Subacromial bursitis of left shoulder joint 08/29/2020  . Tendinopathy of left rotator cuff 08/29/2020  . Morbid obesity with BMI of 45.0-49.9, adult (HCC) 08/09/2020  . Superior glenoid labrum lesion of left shoulder 07/10/2020  . Health maintenance examination 03/23/2019  . Prediabetes 12-31-2018  . Strain of back 12/31/18  . Death of family member Dec 31, 2018  . Back pain 07/31/2018  . Fatigue 05/31/2018  . Chronic low back  pain 11/30/2017  . Shifting sleep-work schedule 06/15/2017  . 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 Sleep related headaches 06/15/2017  . Super obese 06/15/2017  . Mood complaints in sleep disorder 06/15/2017  . Plantar fasciitis of left foot 03/01/2014  . Porokeratosis 03/01/2014  . Pain in lower limb 03/01/2014    Cherlyn Cushing PT, MPT 10/10/2020, 2:20 PM  [facility] [address] [place], [place], [address] Phone: [phone]   Fax:  [phone]  Name: LEATA DOMINY MRN: [id number] Date of Birth: [birth year]

## 2020-10-17 ENCOUNTER — Other Ambulatory Visit: Payer: Self-pay

## 2020-10-17 ENCOUNTER — Ambulatory Visit (INDEPENDENT_AMBULATORY_CARE_PROVIDER_SITE_OTHER): Payer: 59 | Admitting: Rehabilitative and Restorative Service Providers"

## 2020-10-17 ENCOUNTER — Encounter: Payer: Self-pay | Admitting: Rehabilitative and Restorative Service Providers"

## 2020-10-17 DIAGNOSIS — M6281 Muscle weakness (generalized): Secondary | ICD-10-CM

## 2020-10-17 DIAGNOSIS — G8929 Other chronic pain: Secondary | ICD-10-CM | POA: Diagnosis not present

## 2020-10-17 DIAGNOSIS — M25512 Pain in left shoulder: Secondary | ICD-10-CM

## 2020-10-17 DIAGNOSIS — M25612 Stiffness of left shoulder, not elsewhere classified: Secondary | ICD-10-CM | POA: Diagnosis not present

## 2020-10-17 NOTE — Therapy (Signed)
[facility] [address] [place], [place], [address] Phone: [phone]   Fax:  [phone]  Physical Therapy Treatment  Patient Details  Name: Deborah Jacobson MRN: [id number] Date of Birth: May 28, 1981 Referring Provider (PT): Tarry Kos MD   Encounter Date: 10/17/2020   PT End of Session - 10/17/20 1733    Visit Number 8    Number of Visits 12    Date for PT Re-Evaluation 11/09/20    Authorization Type [employer]    PT Start Time 1347    PT Stop Time 1427    PT Time Calculation (min) 40 min    Activity Tolerance Patient tolerated treatment well;No increased pain    Behavior During Therapy WFL for tasks assessed/performed           Past Medical History:  Diagnosis Date  . Acid reflux   . Diabetes in pregnancy   . Hypertension   . Seasonal allergies     Past Surgical History:  Procedure Laterality Date  . CESAREAN SECTION    . SHOULDER ARTHROSCOPY WITH BICEPS TENDON REPAIR Left 08/29/2020   Procedure: LEFT SHOULDER ARTHROSCOPY WITH BICEPS TENODESIS;  Surgeon: Tarry Kos, MD;  Location: [facility];  Service: Orthopedics;  Laterality: Left;    There were no vitals filed for this visit.   Subjective Assessment - 10/17/20 1729    Subjective Isbella reports pain is gradually improving now that she is "mostly" done with her move into a new home.    Pertinent History Improving L UE function.  Still limited with reaching, overhead function and lifting.    Limitations House hold activities    How long can you sit comfortably? 2 hours (was 15 minutes)    How long can you stand comfortably? No problem (was 15 minutes)    How long can you walk comfortably? Mile +    Diagnostic tests MRI of shoulder - partial slap tear.    Patient Stated Goals Get shoulder healthy for return to normal activities including work.    Currently in Pain? Yes    Pain Score 3     Pain Location Shoulder    Pain Orientation Left    Pain  Descriptors / Indicators Aching;Sore    Pain Type Chronic pain;Surgical pain    Pain Onset More than a month ago    Pain Frequency Intermittent    Aggravating Factors  L shoulder use    Pain Relieving Factors Exercise, meds, modalities    Effect of Pain on Daily Activities Limits reaching, overhead function and return to work                             [facility] Adult PT Treatment/Exercise - 10/17/20 0001      Therapeutic Activites    Therapeutic Activities Other Therapeutic Activities   Reaching 3 10X each (overhead, behind head, behind back)     Exercises   Exercises Shoulder      Shoulder Exercises: Supine   Protraction Strengthening;Left;20 reps;Weights    Protraction Weight (lbs) 3#    External Rotation AROM;Left;10 reps   Slow to comfort   Internal Rotation AROM;Left;10 reps   10 seconds   Flexion AROM;Left;10 reps   10 seconds; protract 1st     Shoulder Exercises: Sidelying   External Rotation Strengthening;Left;10 reps   2 sets 1#     Shoulder Exercises: Standing   Internal Rotation AAROM;Left;10 reps  10 seconds   Retraction Strengthening;Both;10 reps   5 seconds shoulder blade pinches   Diagonals Other (comment)   Posterior capsule stretch (Horizontal adduction) 10X 10 sec   Other Standing Exercises Biceps with supination 2# 10X 3 seconds slow eccentrics                  PT Education - 10/17/20 1731    Education Details Added thumb up the back IR stretch to HEP.  Discussed starting beginner RTC strengthening next visit.    Person(s) Educated Patient    Methods Explanation;Demonstration;Tactile cues;Verbal cues;Handout    Comprehension Verbal cues required;Returned demonstration;Need further instruction;Verbalized understanding;Tactile cues required            PT Short Term Goals - 10/17/20 1731      PT SHORT TERM GOAL #1   Title I with initial HEP    Time 4    Period Weeks    Status Achieved    Target Date 09/21/20      PT SHORT  TERM GOAL #2   Title Report =/> 50% reduction of overall back pain to allow return to normal activities.    Time 12    Period Weeks    Status Achieved    Target Date 11/09/20      PT SHORT TERM GOAL #3   Period Weeks    Status Achieved    Target Date 11/09/20             PT Long Term Goals - 10/17/20 1732      PT LONG TERM GOAL #1   Title I with long-term HEP.    Time 12    Period Weeks    Status On-going      PT LONG TERM GOAL #2   Title [name] will report L shoulder pain consistently 0-2/10 on the Numeric Pain Rating Scale.    Baseline 4-5/10    Time 12    Period Weeks    Status On-going      PT LONG TERM GOAL #3   Title Improve FOTO score to 67.    Baseline 46 at eval, 55 at last reassessment, 66 on 10/17/2020    Time 12    Period Weeks    Status On-going      PT LONG TERM GOAL #4   Title Improve shoulder AROM for flexion to 170; ER to 90; IR to 60 and horizontal adduction to 40 degrees.    Baseline Flexion 150; ER 70; IR 70 and HA 30 degrees.    Time 12    Period Weeks    Status On-going      PT LONG TERM GOAL #5   Title Improve L shoulder strength as assessed by improve MMT scores.    Baseline Deferred due to just over 3 weeks post-surgery.    Time 12    Period Weeks    Status On-going                 Plan - 10/17/20 1733    Clinical Impression Statement FOTO score, observed AROM and self-reported function continue to improve.  Added thumb up the back IR stretch today.  Slowly progress strength with beginning RTC strengthening next visit with another RA in 3-4 weeks for possible DC.    Personal Factors and Comorbidities Comorbidity 3+    Comorbidities HTN, pre- DM, HA's, chronic LBP    Examination-Activity Limitations Reach Overhead;Bend;Caring for Others;Dressing;Hygiene/Grooming;Lift;Sleep    Examination-Participation Restrictions Cleaning;Meal Prep;Laundry  Stability/Clinical Decision Making Stable/Uncomplicated    Rehab Potential Good     PT Frequency 2x / week    PT Duration 8 weeks    PT Treatment/Interventions Patient/family education;Therapeutic activities;Passive range of motion;Therapeutic exercise;Cryotherapy;Neuromuscular re-education;Manual techniques;Vasopneumatic Device    PT Next Visit Plan Capsular stretching, PROM and AAROM    PT Home Exercise Plan Access Code: [email or web address].  Access Code: [email or web address]    Consulted and Agree with Plan of Care Patient           Patient will benefit from skilled therapeutic intervention in order to improve the following deficits and impairments:  Decreased range of motion, Obesity, Impaired UE functional use, Pain, Hypomobility, Decreased strength, Decreased activity tolerance, Decreased endurance, Increased edema  Visit Diagnosis: Muscle weakness (generalized)  Stiffness of left shoulder, not elsewhere classified  Chronic pain in left shoulder     Problem List Patient Active Problem List   Diagnosis Date Noted  . Subacromial bursitis of left shoulder joint 08/29/2020  . Tendinopathy of left rotator cuff 08/29/2020  . Morbid obesity with BMI of 45.0-49.9, adult (HCC) 08/09/2020  . Superior glenoid labrum lesion of left shoulder 07/10/2020  . Health maintenance examination 03/23/2019  . Prediabetes 12/28/18  . Strain of back 2018/12/28  . Death of family member 12/28/18  . Back pain 07/31/2018  . Fatigue 05/31/2018  . Chronic low back pain 11/30/2017  . Shifting sleep-work schedule 06/15/2017  . 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 Sleep related headaches 06/15/2017  . Super obese 06/15/2017  . Mood complaints in sleep disorder 06/15/2017  . Plantar fasciitis of left foot 03/01/2014  . Porokeratosis 03/01/2014  . Pain in lower limb 03/01/2014    Cherlyn Cushing PT, MPT 10/17/2020, 5:35 PM  [facility] [address] [place], [place], [address] Phone: [phone]   Fax:  [phone]  Name: KESHAWNA DIX MRN:  [id number] Date of Birth: 14-Sep-1981

## 2020-10-19 ENCOUNTER — Telehealth: Payer: Self-pay | Admitting: Rehabilitative and Restorative Service Providers"

## 2020-10-19 ENCOUNTER — Encounter: Payer: Self-pay | Admitting: Orthopaedic Surgery

## 2020-10-19 NOTE — Telephone Encounter (Signed)
Deborah Jacobson was concerned with increased shoulder pain after doing heavier house cores on Wednesday (laundry).  I advised her to keep exercises simple (Codmans and SBP) and use ice for the next 2 days.  She can gradually return to her normal home exercise program on Sunday if her pain is back to it's previous baseline level.  If not, keep with easy exercises until her next PT visit although I do not think this will be necessary.

## 2020-10-22 MED ORDER — KETOROLAC TROMETHAMINE 10 MG PO TABS: 10.0000 mg | ORAL_TABLET | Freq: Two times a day (BID) | ORAL | 0 refills | Status: DC | PRN

## 2020-10-24 ENCOUNTER — Encounter: Payer: Self-pay | Admitting: Rehabilitative and Restorative Service Providers"

## 2020-10-24 ENCOUNTER — Ambulatory Visit (INDEPENDENT_AMBULATORY_CARE_PROVIDER_SITE_OTHER): Payer: 59 | Admitting: Rehabilitative and Restorative Service Providers"

## 2020-10-24 ENCOUNTER — Other Ambulatory Visit: Payer: Self-pay

## 2020-10-24 DIAGNOSIS — M6281 Muscle weakness (generalized): Secondary | ICD-10-CM

## 2020-10-24 DIAGNOSIS — M25512 Pain in left shoulder: Secondary | ICD-10-CM

## 2020-10-24 DIAGNOSIS — G8929 Other chronic pain: Secondary | ICD-10-CM

## 2020-10-24 DIAGNOSIS — M25612 Stiffness of left shoulder, not elsewhere classified: Secondary | ICD-10-CM | POA: Diagnosis not present

## 2020-10-24 DIAGNOSIS — R293 Abnormal posture: Secondary | ICD-10-CM | POA: Diagnosis not present

## 2020-10-24 NOTE — Therapy (Signed)
[facility] [address] [place], [place], [address] Phone: [phone]   Fax:  [phone]  Physical Therapy Treatment  Patient Details  Name: Deborah Jacobson MRN: [id number] Date of Birth: June 05, 1981 Referring Provider (PT): Deborah Kos MD   Encounter Date: 10/24/2020   PT End of Session - 10/24/20 1452    Visit Number 9    Number of Visits 12    Date for PT Re-Evaluation 11/09/20    Authorization Type [employer]    PT Start Time 1343    PT Stop Time 1436    PT Time Calculation (min) 53 min    Activity Tolerance Patient tolerated treatment well;No increased pain    Behavior During Therapy WFL for tasks assessed/performed           Past Medical History:  Diagnosis Date  . Acid reflux   . Diabetes in pregnancy   . Hypertension   . Seasonal allergies     Past Surgical History:  Procedure Laterality Date  . CESAREAN SECTION    . SHOULDER ARTHROSCOPY WITH BICEPS TENDON REPAIR Left 08/29/2020   Procedure: LEFT SHOULDER ARTHROSCOPY WITH BICEPS TENODESIS;  Surgeon: Deborah Kos, MD;  Location: [facility];  Service: Orthopedics;  Laterality: Left;    There were no vitals filed for this visit.   Subjective Assessment - 10/24/20 1424    Subjective Pain is better than at her last visit and had calmed down significantly by Sunday (visit Friday).  It appears overuse with making beds, laundry and baking is flaring her back up.  We discussed taking it easy and allowing other people to help as we are still < 2 months post-surgery.    Pertinent History Improving L UE function.  Still limited with reaching, overhead function and lifting.    Limitations House hold activities    How long can you sit comfortably? 2 hours (was 15 minutes)    How long can you stand comfortably? No problem (was 15 minutes)    How long can you walk comfortably? Mile +    Diagnostic tests MRI of shoulder - partial slap tear.    Patient Stated  Goals Get shoulder healthy for return to normal activities including work.    Currently in Pain? Yes    Pain Score 2     Pain Location Shoulder    Pain Orientation Left    Pain Descriptors / Indicators Aching;Tender    Pain Onset More than a month ago    Pain Frequency Intermittent    Aggravating Factors  Overuse    Pain Relieving Factors Exercise, meds and modalities    Effect of Pain on Daily Activities Overuse leads to flare-ups and slows post-surgical progress    Multiple Pain Sites No              [facility] PT Assessment - 10/24/20 0001      AROM   Right/Left Shoulder Left    Left Shoulder Flexion 150 Degrees    Left Shoulder Internal Rotation 45 Degrees    Left Shoulder External Rotation 70 Degrees    Left Shoulder Horizontal ADduction 25 Degrees                         [facility] Adult PT Treatment/Exercise - 10/24/20 0001      Exercises   Exercises Shoulder      Shoulder Exercises: Supine   Protraction Strengthening;Left;20 reps;Weights    Protraction Weight (  lbs) 0#, 3# next visit    External Rotation AROM;Left;20 reps   Slow to comfort   Theraband Level (Shoulder External Rotation) --   10 seconds   Internal Rotation AROM;Left;20 reps   10 seconds   Theraband Level (Shoulder Internal Rotation) --   10 seconds   Flexion AROM;Left;10 reps   10 seconds; protract 1st   Theraband Level (Shoulder Flexion) --   protract 1st 10 second hold     Shoulder Exercises: Sidelying   External Rotation Strengthening;Left;10 reps   2 sets 1#     Shoulder Exercises: Standing   Retraction Strengthening;Both;10 reps   5 seconds shoulder blade pinches   Diagonals Other (comment)   Posterior capsule stretch (Horizontal adduction) 10X 10 sec   Other Standing Exercises Thumb up the back stretch with scapular retraction (pull to center of back with thumb up) 10X 10 seconds    Other Standing Exercises Biceps with supination 2# 10X 3 seconds slow eccentrics                   PT Education - 10/24/20 1427    Education Details Reviewed HEP with emphasis on capsular stretching, scapular and beginner RTC strengthening.  Lots of time talking about avoiding overuse.    Person(s) Educated Patient    Methods Explanation;Demonstration;Tactile cues;Verbal cues    Comprehension Verbalized understanding;Tactile cues required;Need further instruction;Returned demonstration;Verbal cues required            PT Short Term Goals - 10/24/20 1429      PT SHORT TERM GOAL #1   Title I with initial HEP    Time 4    Period Weeks    Status Achieved    Target Date 09/21/20      PT SHORT TERM GOAL #2   Title Report =/> 50% reduction of overall back pain to allow return to normal activities.    Time 12    Period Weeks    Status Achieved    Target Date 11/09/20      PT SHORT TERM GOAL #3   Period Weeks    Status Achieved    Target Date 11/09/20             PT Long Term Goals - 10/24/20 1452      PT LONG TERM GOAL #1   Title I with long-term HEP.    Time 12    Period Weeks    Status On-going      PT LONG TERM GOAL #2   Title [name] will report L shoulder pain consistently 0-2/10 on the Numeric Pain Rating Scale.    Baseline 4-5/10    Time 12    Period Weeks    Status On-going      PT LONG TERM GOAL #3   Title Improve FOTO score to 67.    Baseline 46 at eval, 55 at last reassessment, 66 on 10/17/2020    Time 12    Period Weeks    Status On-going      PT LONG TERM GOAL #4   Title Improve shoulder AROM for flexion to 170; ER to 90; IR to 60 and horizontal adduction to 40 degrees.    Baseline Flexion 150; ER 70; IR 45 and HA 25 degrees.    Time 12    Period Weeks    Status On-going      PT LONG TERM GOAL #5   Title Improve L shoulder strength as assessed by improve MMT scores.  Baseline Deferred due to just over 3 weeks post-surgery.    Time 12    Period Weeks    Status On-going                 Plan - 10/24/20 1453     Clinical Impression Statement Overall progress post-surgery has been positive.  [name] has been over doing house chores the past 2 weeks (stripping beds, doing laundry, baking, cleaning) which has lead to some overuse and increased pain.  As a result, she has cut back on her HEP compliance.  We discussed the fact that she is < 2 months post-surgery and still needs to pace herself and ask for help with heavier physical demand activities.  With return to the prescribed HEP schedule, her prognosis to meet all LTGs remains good.    Personal Factors and Comorbidities Comorbidity 3+    Comorbidities HTN, pre- DM, HA's, chronic LBP    Examination-Activity Limitations Reach Overhead;Bend;Caring for Others;Dressing;Hygiene/Grooming;Lift;Sleep    Examination-Participation Restrictions Cleaning;Meal Prep;Laundry    Stability/Clinical Decision Making Stable/Uncomplicated    Rehab Potential Good    PT Frequency 2x / week    PT Duration 8 weeks    PT Treatment/Interventions Patient/family education;Therapeutic activities;Passive range of motion;Therapeutic exercise;Cryotherapy;Neuromuscular re-education;Manual techniques;Vasopneumatic Device    PT Next Visit Plan Capsular stretching, PROM and AAROM    PT Home Exercise Plan Access Code: [email or web address].  Access Code: [email or web address]    Consulted and Agree with Plan of Care Patient           Patient will benefit from skilled therapeutic intervention in order to improve the following deficits and impairments:  Decreased range of motion,Obesity,Impaired UE functional use,Pain,Hypomobility,Decreased strength,Decreased activity tolerance,Decreased endurance,Increased edema  Visit Diagnosis: Muscle weakness (generalized)  Stiffness of left shoulder, not elsewhere classified  Chronic pain in left shoulder  Posture abnormality     Problem List Patient Active Problem List   Diagnosis Date Noted  . Subacromial bursitis of left shoulder joint 08/29/2020  .  Tendinopathy of left rotator cuff 08/29/2020  . Morbid obesity with BMI of 45.0-49.9, adult (HCC) 08/09/2020  . Superior glenoid labrum lesion of left shoulder 07/10/2020  . Health maintenance examination 03/23/2019  . Prediabetes 12-18-2018  . Strain of back 18-Dec-2018  . Death of family member 12/18/18  . Back pain 07/31/2018  . Fatigue 05/31/2018  . Chronic low back pain 11/30/2017  . Shifting sleep-work schedule 06/15/2017  . 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 Sleep related headaches 06/15/2017  . Super obese 06/15/2017  . Mood complaints in sleep disorder 06/15/2017  . Plantar fasciitis of left foot 03/01/2014  . Porokeratosis 03/01/2014  . Pain in lower limb 03/01/2014    Cherlyn Cushing PT, MPT 10/24/2020, 2:56 PM  [facility] [address] [place], [place], [address] Phone: [phone]   Fax:  [phone]  Name: Deborah Jacobson MRN: [id number] Date of Birth: 01/08/1981

## 2020-10-31 ENCOUNTER — Ambulatory Visit (INDEPENDENT_AMBULATORY_CARE_PROVIDER_SITE_OTHER): Payer: 59 | Admitting: Rehabilitative and Restorative Service Providers"

## 2020-10-31 ENCOUNTER — Other Ambulatory Visit: Payer: Self-pay

## 2020-10-31 ENCOUNTER — Encounter: Payer: Self-pay | Admitting: Rehabilitative and Restorative Service Providers"

## 2020-10-31 DIAGNOSIS — M25612 Stiffness of left shoulder, not elsewhere classified: Secondary | ICD-10-CM

## 2020-10-31 DIAGNOSIS — M25512 Pain in left shoulder: Secondary | ICD-10-CM

## 2020-10-31 DIAGNOSIS — M6281 Muscle weakness (generalized): Secondary | ICD-10-CM | POA: Diagnosis not present

## 2020-10-31 DIAGNOSIS — G8929 Other chronic pain: Secondary | ICD-10-CM | POA: Diagnosis not present

## 2020-10-31 NOTE — Therapy (Signed)
[facility] [address] [place], [place], [address] Phone: [phone]   Fax:  [phone]  Physical Therapy Treatment/Reassessment  Patient Details  Name: Deborah Jacobson MRN: [id number] Date of Birth: 02/27/1981 Referring Provider (PT): Tarry Kos MD   Encounter Date: 10/31/2020   PT End of Session - 10/31/20 1735    Visit Number 10    Number of Visits 12    Date for PT Re-Evaluation 11/09/20    Authorization Type [employer]    Progress Note Due on Visit 12    PT Start Time 1354    PT Stop Time 1433    PT Time Calculation (min) 39 min    Activity Tolerance Patient tolerated treatment well;No increased pain    Behavior During Therapy WFL for tasks assessed/performed           Past Medical History:  Diagnosis Date  . Acid reflux   . Diabetes in pregnancy   . Hypertension   . Seasonal allergies     Past Surgical History:  Procedure Laterality Date  . CESAREAN SECTION    . SHOULDER ARTHROSCOPY WITH BICEPS TENDON REPAIR Left 08/29/2020   Procedure: LEFT SHOULDER ARTHROSCOPY WITH BICEPS TENODESIS;  Surgeon: Tarry Kos, MD;  Location: [facility];  Service: Orthopedics;  Laterality: Left;    There were no vitals filed for this visit.   Subjective Assessment - 10/31/20 1729    Subjective Nattaly reports gaining more confidence with her L shoulder over the past week.  HEP with supine stretches has slipped.    Pertinent History Improving L UE function.  Still limited with reaching, overhead function and lifting.    Limitations House hold activities    How long can you sit comfortably? 2 hours (was 15 minutes)    How long can you stand comfortably? No problem (was 15 minutes)    How long can you walk comfortably? Mile +    Diagnostic tests MRI of shoulder - partial slap tear.    Patient Stated Goals Get shoulder healthy for return to normal activities including work.    Currently in Pain? Yes    Pain Score 3      Pain Location Shoulder    Pain Orientation Left    Pain Type Chronic pain;Surgical pain    Pain Onset More than a month ago    Pain Frequency Intermittent    Aggravating Factors  Overuse    Pain Relieving Factors Exercise and modalities, meds on occasion    Effect of Pain on Daily Activities Yaret is still easing back into normal ADLs    Multiple Pain Sites No              [facility] PT Assessment - 10/31/20 0001      AROM   Right/Left Shoulder Left    Left Shoulder Flexion 150 Degrees    Left Shoulder Internal Rotation 50 Degrees    Left Shoulder External Rotation 70 Degrees    Left Shoulder Horizontal ADduction 20 Degrees                         [facility] Adult PT Treatment/Exercise - 10/31/20 0001      Exercises   Exercises Shoulder      Shoulder Exercises: Supine   Protraction Strengthening;20 reps;Both    Protraction Weight (lbs) 3#    External Rotation AROM;Left;20 reps   Slow to comfort   Theraband Level (Shoulder External Rotation) --  10 seconds   Internal Rotation AROM;Left;20 reps   10 seconds   Theraband Level (Shoulder Internal Rotation) --   10 seconds     Shoulder Exercises: Sidelying   External Rotation Strengthening;Left;10 reps;Weights;Other (comment)    Theraband Level (Shoulder External Rotation) Other (comment)   1#   External Rotation Limitations 2 sets      Shoulder Exercises: Standing   External Rotation Strengthening;Left;10 reps    Theraband Level (Shoulder External Rotation) Level 2 (Red)    External Rotation Limitations Within comfortable range    Internal Rotation Strengthening;Left;10 reps;Theraband   10 seconds thumb up the back   Theraband Level (Shoulder Internal Rotation) Level 2 (Red)    Internal Rotation Limitations Within comfortable range    Retraction Strengthening;Both;10 reps;Theraband;Other (comment)   5 seconds shoulder blade pinches   Theraband Level (Shoulder Retraction) Level 2 (Red)    Retraction  Limitations 5 second hold    Diagonals --   Posterior capsule stretch (Horizontal adduction) 10X 10 sec   Other Standing Exercises Thumb up the back stretch with scapular retraction (pull to center of back with thumb up) 10X 10 seconds    Other Standing Exercises Biceps with supination 10X 3#                  PT Education - 10/31/20 1730    Education Details Reviewed exam findings with heavy emphasis on increasing compliance with supine stretches.  AROM is 78% of expected and 90% or better is expected 12 weeks post-surgery (currently 9-10 weeks post-surgery).    Person(s) Educated Patient    Methods Explanation;Demonstration;Tactile cues;Verbal cues;Handout    Comprehension Verbalized understanding;Returned demonstration;Verbal cues required;Need further instruction;Tactile cues required            PT Short Term Goals - 10/31/20 1732      PT SHORT TERM GOAL #1   Title I with initial HEP    Time 4    Period Weeks    Status Achieved    Target Date 09/21/20      PT SHORT TERM GOAL #2   Title Report =/> 50% reduction of overall back pain to allow return to normal activities.    Time 12    Period Weeks    Status Achieved    Target Date 11/09/20      PT SHORT TERM GOAL #3   Period Weeks    Status Achieved    Target Date 11/09/20             PT Long Term Goals - 10/31/20 1732      PT LONG TERM GOAL #1   Title I with long-term HEP.    Time 12    Period Weeks    Status On-going      PT LONG TERM GOAL #2   Title Lien will report L shoulder pain consistently 0-2/10 on the Numeric Pain Rating Scale.    Baseline 4-5/10    Time 12    Period Weeks    Status On-going      PT LONG TERM GOAL #3   Title Improve FOTO score to 67.    Baseline 46 at eval, 55 at last reassessment, 66 on 10/17/2020, 60 at 10/31/20 visit.    Time 12    Period Weeks    Status On-going      PT LONG TERM GOAL #4   Title Improve shoulder AROM for flexion to 170; ER to 90; IR to 60 and  horizontal adduction  to 40 degrees.    Baseline AROM is 78% of LTG.    Time 12    Period Weeks    Status On-going      PT LONG TERM GOAL #5   Title Improve L shoulder strength as assessed by improve MMT scores.    Baseline Deferred due to just over 3 weeks post-surgery.    Time 12    Period Weeks    Status On-going                 Plan - 10/31/20 1736    Clinical Impression Statement Overuse and a decrease in compliance with her supine shoulder stretches has resulted in a decreased FOTO score (60) as compared to 2 weeks ago (66).  With continuing to get better with avoiding overuse and returning to the prescibed HEP (handout given today), Derya will be back on track and improve upon her current AROM (78% of LTG), strength and self-reported function.  All are significantly improved post-surgery but more limited than 2 weeks ago.  I expect this will be a short-term set back with modifications and education provided today.    Personal Factors and Comorbidities Comorbidity 3+    Comorbidities HTN, pre- DM, HA's, chronic LBP    Examination-Activity Limitations Reach Overhead;Bend;Caring for Others;Dressing;Hygiene/Grooming;Lift;Sleep    Examination-Participation Restrictions Cleaning;Meal Prep;Laundry    Stability/Clinical Decision Making Stable/Uncomplicated    Rehab Potential Good    PT Frequency 2x / week    PT Duration 4 weeks    PT Treatment/Interventions Patient/family education;Therapeutic activities;Passive range of motion;Therapeutic exercise;Cryotherapy;Neuromuscular re-education;Manual techniques;Vasopneumatic Device    PT Next Visit Plan AROM and strength    PT Home Exercise Plan Access Code: [email or web address].    Consulted and Agree with Plan of Care Patient           Patient will benefit from skilled therapeutic intervention in order to improve the following deficits and impairments:  Decreased range of motion,Obesity,Impaired UE functional use,Pain,Hypomobility,Decreased  strength,Decreased activity tolerance,Decreased endurance,Increased edema  Visit Diagnosis: Muscle weakness (generalized)  Stiffness of left shoulder, not elsewhere classified  Chronic pain in left shoulder     Problem List Patient Active Problem List   Diagnosis Date Noted  . Subacromial bursitis of left shoulder joint 08/29/2020  . Tendinopathy of left rotator cuff 08/29/2020  . Morbid obesity with BMI of 45.0-49.9, adult (HCC) 08/09/2020  . Superior glenoid labrum lesion of left shoulder 07/10/2020  . Health maintenance examination 03/23/2019  . Prediabetes 12/21/18  . Strain of back 2018-12-21  . Death of family member 2018/12/21  . Back pain 07/31/2018  . Fatigue 05/31/2018  . Chronic low back pain 11/30/2017  . Shifting sleep-work schedule 06/15/2017  . 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 Sleep related headaches 06/15/2017  . Super obese 06/15/2017  . Mood complaints in sleep disorder 06/15/2017  . Plantar fasciitis of left foot 03/01/2014  . Porokeratosis 03/01/2014  . Pain in lower limb 03/01/2014    Cherlyn Cushing PT, MPT 10/31/2020, 5:40 PM  [facility] [address] [place], [place], [address] Phone: [phone]   Fax:  [phone]  Name: KIONDRA CAICEDO MRN: [id number] Date of Birth: [birth year]

## 2020-11-05 ENCOUNTER — Other Ambulatory Visit: Payer: Self-pay | Admitting: Physician Assistant

## 2020-11-05 ENCOUNTER — Other Ambulatory Visit: Payer: Self-pay

## 2020-11-05 ENCOUNTER — Encounter: Payer: Self-pay | Admitting: Orthopaedic Surgery

## 2020-11-05 ENCOUNTER — Ambulatory Visit (INDEPENDENT_AMBULATORY_CARE_PROVIDER_SITE_OTHER): Payer: 59 | Admitting: Nurse Practitioner

## 2020-11-05 ENCOUNTER — Encounter: Payer: Self-pay | Admitting: Nurse Practitioner

## 2020-11-05 VITALS — BP 124/86 | HR 77 | Temp 98.2°F | Ht <= 58 in | Wt 207.2 lb

## 2020-11-05 DIAGNOSIS — M25512 Pain in left shoulder: Secondary | ICD-10-CM

## 2020-11-05 DIAGNOSIS — Z6841 Body Mass Index (BMI) 40.0 and over, adult: Secondary | ICD-10-CM

## 2020-11-05 DIAGNOSIS — G8929 Other chronic pain: Secondary | ICD-10-CM

## 2020-11-05 DIAGNOSIS — Z79899 Other long term (current) drug therapy: Secondary | ICD-10-CM

## 2020-11-05 DIAGNOSIS — Z0000 Encounter for general adult medical examination without abnormal findings: Secondary | ICD-10-CM

## 2020-11-05 DIAGNOSIS — Z1231 Encounter for screening mammogram for malignant neoplasm of breast: Secondary | ICD-10-CM

## 2020-11-05 DIAGNOSIS — R7303 Prediabetes: Secondary | ICD-10-CM | POA: Diagnosis not present

## 2020-11-05 LAB — POCT UA - MICROALBUMIN
Albumin/Creatinine Ratio, Urine, POC: 30
Creatinine, POC: 300 mg/dL
Microalbumin Ur, POC: 80 mg/L

## 2020-11-05 MED ORDER — OZEMPIC (1 MG/DOSE) 4 MG/3ML ~~LOC~~ SOPN: 1.0000 mg | PEN_INJECTOR | SUBCUTANEOUS | 1 refills | Status: DC

## 2020-11-05 MED ORDER — TRAMADOL HCL 50 MG PO TABS: 50.0000 mg | ORAL_TABLET | Freq: Three times a day (TID) | ORAL | 0 refills | Status: DC | PRN

## 2020-11-05 MED ORDER — DICLOFENAC SODIUM 1 % EX GEL: 2.0000 g | Freq: Four times a day (QID) | CUTANEOUS | 2 refills | Status: DC

## 2020-11-05 NOTE — Telephone Encounter (Signed)
I am just now seeing this as I was off of work last Thursday until today.  Please let her know that I just sent this in

## 2020-11-05 NOTE — Patient Instructions (Signed)
Health Maintenance, Female Adopting a healthy lifestyle and getting preventive care are important in promoting health and wellness. Ask your health care provider about:  The right schedule for you to have regular tests and exams.  Things you can do on your own to prevent diseases and keep yourself healthy. What should I know about diet, weight, and exercise? Eat a healthy diet   Eat a diet that includes plenty of vegetables, fruits, low-fat dairy products, and lean protein.  Do not eat a lot of foods that are high in solid fats, added sugars, or sodium. Maintain a healthy weight Body mass index (BMI) is used to identify weight problems. It estimates body fat based on height and weight. Your health care provider can help determine your BMI and help you achieve or maintain a healthy weight. Get regular exercise Get regular exercise. This is one of the most important things you can do for your health. Most adults should:  Exercise for at least 150 minutes each week. The exercise should increase your heart rate and make you sweat (moderate-intensity exercise).  Do strengthening exercises at least twice a week. This is in addition to the moderate-intensity exercise.  Spend less time sitting. Even light physical activity can be beneficial. Watch cholesterol and blood lipids Have your blood tested for lipids and cholesterol at 40 years of age, then have this test every 5 years. Have your cholesterol levels checked more often if:  Your lipid or cholesterol levels are high.  You are older than 40 years of age.  You are at high risk for heart disease. What should I know about cancer screening? Depending on your health history and family history, you may need to have cancer screening at various ages. This may include screening for:  Breast cancer.  Cervical cancer.  Colorectal cancer.  Skin cancer.  Lung cancer. What should I know about heart disease, diabetes, and high blood  pressure? Blood pressure and heart disease  High blood pressure causes heart disease and increases the risk of stroke. This is more likely to develop in people who have high blood pressure readings, are of African descent, or are overweight.  Have your blood pressure checked: ? Every 3-5 years if you are 18-39 years of age. ? Every year if you are 40 years old or older. Diabetes Have regular diabetes screenings. This checks your fasting blood sugar level. Have the screening done:  Once every three years after age 40 if you are at a normal weight and have a low risk for diabetes.  More often and at a younger age if you are overweight or have a high risk for diabetes. What should I know about preventing infection? Hepatitis B If you have a higher risk for hepatitis B, you should be screened for this virus. Talk with your health care provider to find out if you are at risk for hepatitis B infection. Hepatitis C Testing is recommended for:  Everyone born from 1945 through 1965.  Anyone with known risk factors for hepatitis C. Sexually transmitted infections (STIs)  Get screened for STIs, including gonorrhea and chlamydia, if: ? You are sexually active and are younger than 40 years of age. ? You are older than 40 years of age and your health care provider tells you that you are at risk for this type of infection. ? Your sexual activity has changed since you were last screened, and you are at increased risk for chlamydia or gonorrhea. Ask your health care provider if   you are at risk.  Ask your health care provider about whether you are at high risk for HIV. Your health care provider may recommend a prescription medicine to help prevent HIV infection. If you choose to take medicine to prevent HIV, you should first get tested for HIV. You should then be tested every 3 months for as long as you are taking the medicine. Pregnancy  If you are about to stop having your period (premenopausal) and  you may become pregnant, seek counseling before you get pregnant.  Take 400 to 800 micrograms (mcg) of folic acid every day if you become pregnant.  Ask for birth control (contraception) if you want to prevent pregnancy. Osteoporosis and menopause Osteoporosis is a disease in which the bones lose minerals and strength with aging. This can result in bone fractures. If you are 65 years old or older, or if you are at risk for osteoporosis and fractures, ask your health care provider if you should:  Be screened for bone loss.  Take a calcium or vitamin D supplement to lower your risk of fractures.  Be given hormone replacement therapy (HRT) to treat symptoms of menopause. Follow these instructions at home: Lifestyle  Do not use any products that contain nicotine or tobacco, such as cigarettes, e-cigarettes, and chewing tobacco. If you need help quitting, ask your health care provider.  Do not use street drugs.  Do not share needles.  Ask your health care provider for help if you need support or information about quitting drugs. Alcohol use  Do not drink alcohol if: ? Your health care provider tells you not to drink. ? You are pregnant, may be pregnant, or are planning to become pregnant.  If you drink alcohol: ? Limit how much you use to 0-1 drink a day. ? Limit intake if you are breastfeeding.  Be aware of how much alcohol is in your drink. In the [place] one drink equals one 12 oz bottle of beer (355 mL), one 5 oz glass of wine (148 mL), or one 1 oz glass of hard liquor (44 mL). General instructions  Schedule regular health, dental, and eye exams.  Stay current with your vaccines.  Tell your health care provider if: ? You often feel depressed. ? You have ever been abused or do not feel safe at home. Summary  Adopting a healthy lifestyle and getting preventive care are important in promoting health and wellness.  Follow your health care provider's instructions about healthy  diet, exercising, and getting tested or screened for diseases.  Follow your health care provider's instructions on monitoring your cholesterol and blood pressure. This information is not intended to replace advice given to you by your health care provider. Make sure you discuss any questions you have with your health care provider. Document Revised: 10/20/2018 Document Reviewed: 10/20/2018 Elsevier Patient Education  2020 [employer]  

## 2020-11-05 NOTE — Progress Notes (Signed)
I,Deborah Jacobson,acting as a scribe for Deborah Moore, FNP.,have documented all relevant documentation on the behalf of Deborah Moore, FNP,as directed by  Deborah Moore, FNP while in the presence of Deborah Moore, FNP. This visit occurred during the SARS-CoV-2 public health emergency.  Safety protocols were in place, including screening questions prior to the visit, additional usage of staff PPE, and extensive cleaning of exam room while observing appropriate contact time as indicated for disinfecting solutions.  Subjective:     Patient ID: Deborah Jacobson , female    DOB: 12/26/1980 , 39 y.o.   MRN: [id number]   Chief Complaint  Patient presents with  . Annual Exam    HPI  Here for hm  Wt Readings from Last 3 Encounters: 11/05/20 : 207 lb 3.2 oz (94 kg) 08/29/20 : 209 lb 3.5 oz (94.9 kg) 08/09/20 : 203 lb (92.1 kg)     Past Medical History:  Diagnosis Date  . Acid reflux   . Diabetes in pregnancy   . Hypertension   . Seasonal allergies      Family History  Problem Relation Age of Onset  . Diabetes Mother   . Liver disease Mother   . Hypertension Mother      Current Outpatient Medications:  .  albuterol (VENTOLIN HFA) 108 (90 Base) MCG/ACT inhaler, INHALE 2 PUFFS INTO THE LUNGS EVERY 6 HOURS AS NEEDED FOR WHEEZING OR SHORTNESS OF BREATH, Disp: 6.7 g, Rfl: 1 .  diclofenac Sodium (VOLTAREN) 1 % GEL, Apply 2 g topically 4 (four) times daily., Disp: 100 g, Rfl: 2 .  diphenhydrAMINE (BENADRYL) 25 MG tablet, Take 25 mg by mouth every 6 (six) hours as needed for allergies., Disp: , Rfl:  .  hydrochlorothiazide (HYDRODIURIL) 12.5 MG tablet, Take 1 tablet (12.5 mg total) by mouth daily., Disp: 90 tablet, Rfl: 1 .  hydrOXYzine (ATARAX/VISTARIL) 25 MG tablet, Take 1 tablet (25 mg total) by mouth 3 (three) times daily as needed., Disp: 30 tablet, Rfl: 2 .  meloxicam (MOBIC) 15 MG tablet, Take 0.5-1 tablets (7.5-15 mg total) by mouth daily as needed for pain., Disp: 30 tablet,  Rfl: 6 .  Semaglutide, 1 MG/DOSE, (OZEMPIC, 1 MG/DOSE,) 4 MG/3ML SOPN, Inject 1 mg into the skin once a week., Disp: 9 mL, Rfl: 1 .  traMADol (ULTRAM) 50 MG tablet, Take 1 tablet (50 mg total) by mouth 3 (three) times daily as needed., Disp: 30 tablet, Rfl: 0   Allergies  Allergen Reactions  . Latex Hives    Powder from gloves      The patient states she uses tubal ligation for birth control.  No LMP recorded. (Menstrual status: Irregular Periods).  [facility].  Negative for Dysmenorrhea and Negative for Menorrhagia. Negative for: breast discharge, breast lump(s), breast pain and breast self exam. Associated symptoms include abnormal vaginal bleeding. Pertinent negatives include abnormal bleeding (hematology), anxiety, decreased libido, depression, difficulty falling sleep, dyspareunia, history of infertility, nocturia, sexual dysfunction, sleep disturbances, urinary incontinence, urinary urgency, vaginal discharge and vaginal itching. Diet regular. The patient states her exercise level is minimal - currently in PT for left shoulder. She is currently out of work due to her shoulder.     The patient's tobacco use is:  Social History   Tobacco Use  Smoking Status Former Smoker  Smokeless Tobacco Never Used   She has been exposed to passive smoke. The patient's alcohol use is:  Social History   Substance   and Sexual Activity  Alcohol Use No   Additional information: Last pap unknown actual date.    Review of Systems  Constitutional: Negative.   HENT: Negative.   Eyes: Negative.   Respiratory: Negative.   Cardiovascular: Negative.   Gastrointestinal: Negative.   Endocrine: Negative.   Genitourinary: Negative.   Musculoskeletal: Negative.   Skin: Negative.   Allergic/Immunologic: Negative.   Neurological: Negative.   Hematological: Negative.   Psychiatric/Behavioral: Negative.      Today's Vitals   11/05/20 1022  BP: 124/86   Pulse: 77  Temp: 98.2 F (36.8 C)  TempSrc: Oral  Weight: 207 lb 3.2 oz (94 kg)  Height: 4' 9" (1.448 m)  PainSc: 0-No pain   Body mass index is 44.84 kg/m.   Objective:  Physical Exam Vitals reviewed.  Constitutional:      General: She is not in acute distress.    Appearance: Normal appearance. She is well-developed. She is obese.  HENT:     Head: Normocephalic and atraumatic.     Right Ear: Hearing, tympanic membrane, ear canal and external ear normal. There is no impacted cerumen.     Left Ear: Hearing, tympanic membrane, ear canal and external ear normal. There is no impacted cerumen.     Nose:     Comments: Deferred - masked    Mouth/Throat:     Comments: Deferred - masked Eyes:     General: Lids are normal.     Extraocular Movements: Extraocular movements intact.     Conjunctiva/sclera: Conjunctivae normal.     Pupils: Pupils are equal, round, and reactive to light.     Funduscopic exam:    Right eye: No papilledema.        Left eye: No papilledema.  Neck:     Thyroid: No thyroid mass.     Vascular: No carotid bruit.  Cardiovascular:     Rate and Rhythm: Normal rate and regular rhythm.     Pulses: Normal pulses.     Heart sounds: Normal heart sounds. No murmur heard.   Pulmonary:     Effort: Pulmonary effort is normal.     Breath sounds: Normal breath sounds.  Chest:     Chest wall: No mass.  Breasts:     Tanner Score is 5.     Right: Normal. No mass, tenderness, axillary adenopathy or supraclavicular adenopathy.     Left: Normal. No mass, tenderness, axillary adenopathy or supraclavicular adenopathy.    Abdominal:     General: Abdomen is flat. Bowel sounds are normal. There is no distension.     Palpations: Abdomen is soft.     Tenderness: There is no abdominal tenderness.  Genitourinary:    Rectum: Guaiac result negative.  Musculoskeletal:        General: Tenderness (left shoulder pain, she also has healing surgical scar present to anterior  shoulder) present. No swelling. Normal range of motion.     Cervical back: Full passive range of motion without pain, normal range of motion and neck supple.     Right lower leg: No edema.     Left lower leg: No edema.  Lymphadenopathy:     Upper Body:     Right upper body: No supraclavicular, axillary or pectoral adenopathy.     Left upper body: No supraclavicular, axillary or pectoral adenopathy.  Skin:    General: Skin is warm and dry.     Capillary Refill: Capillary refill takes less than 2 seconds.  Neurological:  General: No focal deficit present.     Mental Status: She is alert and oriented to person, place, and time.     Cranial Nerves: No cranial nerve deficit.     Sensory: No sensory deficit.  Psychiatric:        Mood and Affect: Mood normal.        Behavior: Behavior normal.        Thought Content: Thought content normal.        Judgment: Judgment normal.         Assessment And Plan:     1. Encounter for general adult medical examination w/o abnormal findings . Behavior modifications discussed and diet history reviewed.   . Pt will continue to exercise regularly and modify diet with low GI, plant based foods and decrease intake of processed foods.  . Recommend intake of daily multivitamin, Vitamin D, and calcium.  . Recommend mammogram for preventive screenings, as well as recommend immunizations that include influenza, TDAP (up to date), will get records from [facility] for her last PAP if more than 3 years will have her to return for PAP only - VITAMIN D 25 Hydroxy (Vit-D Deficiency, Fractures) - Lipid panel  2. Prediabetes Chronic, controlled Continue with current medications Encouraged to limit intake of sugary foods and drinks Encouraged to increase physical activity to 150 minutes per week as tolerated, I have encouraged her to just walk and increase the time slowly - CMP14+EGFR - Hemoglobin A1c - Semaglutide, 1 MG/DOSE, (OZEMPIC, 1 MG/DOSE,) 4 MG/3ML  SOPN; Inject 1 mg into the skin once a week.  Dispense: 9 mL; Refill: 1  3. Class 3 severe obesity due to excess calories without serious comorbidity with body mass index (BMI) of 40.0 to 44.9 in adult (HCC)  Chronic  Discussed healthy diet and regular exercise options   Encouraged to exercise at least 150 minutes per week with 2 days of strength training  Goal to get her BMI below 40 to start  4. Other long term (current) drug therapy - CBC  5. Chronic left shoulder pain  She continues to go to Rehab for her left shoulder, she does have stiffness     Patient was given opportunity to ask questions. Patient verbalized understanding of the plan and was able to repeat key elements of the plan. All questions were answered to their satisfaction.   Deborah Moore, FNP   I, Deborah Moore, FNP, have reviewed all documentation for this visit. The documentation on 11/05/20 for the exam, diagnosis, procedures, and orders are all accurate and complete.    THE PATIENT IS ENCOURAGED TO PRACTICE SOCIAL DISTANCING DUE TO THE COVID-19 PANDEMIC.   

## 2020-11-06 LAB — CMP14+EGFR
ALT: 11 IU/L (ref 0–32)
AST: 13 IU/L (ref 0–40)
Albumin/Globulin Ratio: 1.2 (ref 1.2–2.2)
Albumin: 4.2 g/dL (ref 3.8–4.8)
Alkaline Phosphatase: 96 IU/L (ref 44–121)
BUN/Creatinine Ratio: 16 (ref 9–23)
BUN: 12 mg/dL (ref 6–24)
Bilirubin Total: <0.2 mg/dL (ref 0.0–1.2)
CO2: 26 mmol/L (ref 20–29)
Calcium: 9.4 mg/dL (ref 8.7–10.2)
Chloride: 101 mmol/L (ref 96–106)
Creatinine, Ser: 0.77 mg/dL (ref 0.57–1.00)
GFR calc Af Amer: 112 mL/min/{1.73_m2} (ref >59)
GFR calc non Af Amer: 97 mL/min/{1.73_m2} (ref >59)
Globulin, Total: 3.4 g/dL (ref 1.5–4.5)
Glucose: 84 mg/dL (ref 65–99)
Potassium: 3.9 mmol/L (ref 3.5–5.2)
Sodium: 139 mmol/L (ref 134–144)
Total Protein: 7.6 g/dL (ref 6.0–8.5)

## 2020-11-06 LAB — CBC
Hematocrit: 37.5 % (ref 34.0–46.6)
Hemoglobin: 12.1 g/dL (ref 11.1–15.9)
MCH: 27.2 pg (ref 26.6–33.0)
MCHC: 32.3 g/dL (ref 31.5–35.7)
MCV: 84 fL (ref 79–97)
Platelets: 328 10*3/uL (ref 150–450)
RBC: 4.45 x10E6/uL (ref 3.77–5.28)
RDW: 14.9 % (ref 11.7–15.4)
WBC: 5.3 10*3/uL (ref 3.4–10.8)

## 2020-11-06 LAB — HEMOGLOBIN A1C
Est. average glucose Bld gHb Est-mCnc: 117 mg/dL
Hgb A1c MFr Bld: 5.7 % — ABNORMAL HIGH (ref 4.8–5.6)

## 2020-11-06 LAB — VITAMIN D 25 HYDROXY (VIT D DEFICIENCY, FRACTURES): Vit D, 25-Hydroxy: 27.3 ng/mL — ABNORMAL LOW (ref 30.0–100.0)

## 2020-11-07 ENCOUNTER — Encounter: Payer: 59 | Admitting: Rehabilitative and Restorative Service Providers"

## 2020-11-07 ENCOUNTER — Telehealth: Payer: Self-pay | Admitting: Rehabilitative and Restorative Service Providers"

## 2020-11-07 NOTE — Telephone Encounter (Signed)
Deborah Jacobson said she was unable to reach [place] by phone.  She is sick and wanted to stay away from people.  We will try again on 11/14/20.

## 2020-11-08 LAB — LIPID PANEL
Chol/HDL Ratio: 2.8 ratio (ref 0.0–4.4)
Cholesterol, Total: 162 mg/dL (ref 100–199)
HDL: 57 mg/dL (ref >39)
LDL Chol Calc (NIH): 96 mg/dL (ref 0–99)
Triglycerides: 42 mg/dL (ref 0–149)
VLDL Cholesterol Cal: 9 mg/dL (ref 5–40)

## 2020-11-08 LAB — SPECIMEN STATUS REPORT

## 2020-11-14 ENCOUNTER — Other Ambulatory Visit: Payer: Self-pay

## 2020-11-14 ENCOUNTER — Ambulatory Visit (INDEPENDENT_AMBULATORY_CARE_PROVIDER_SITE_OTHER): Payer: 59 | Admitting: Rehabilitative and Restorative Service Providers"

## 2020-11-14 ENCOUNTER — Encounter: Payer: Self-pay | Admitting: Rehabilitative and Restorative Service Providers"

## 2020-11-14 DIAGNOSIS — M6281 Muscle weakness (generalized): Secondary | ICD-10-CM | POA: Diagnosis not present

## 2020-11-14 DIAGNOSIS — M25612 Stiffness of left shoulder, not elsewhere classified: Secondary | ICD-10-CM

## 2020-11-14 DIAGNOSIS — M25512 Pain in left shoulder: Secondary | ICD-10-CM

## 2020-11-14 DIAGNOSIS — R293 Abnormal posture: Secondary | ICD-10-CM | POA: Diagnosis not present

## 2020-11-14 DIAGNOSIS — G8929 Other chronic pain: Secondary | ICD-10-CM

## 2020-11-14 NOTE — Therapy (Signed)
[facility] [address] [place], [place], [address] Phone: [phone]   Fax:  [phone]  Physical Therapy Treatment  Patient Details  Name: Deborah Jacobson MRN: [id number] Date of Birth: 1981-03-16 Referring Provider (PT): Tarry Kos MD   Encounter Date: 11/14/2020   PT End of Session - 11/14/20 1713    Visit Number 11    Number of Visits 12    Date for PT Re-Evaluation 11/09/20    Authorization Type [employer]    Progress Note Due on Visit 12    PT Start Time 1433    PT Stop Time 1515    PT Time Calculation (min) 42 min    Activity Tolerance Patient tolerated treatment well;No increased pain    Behavior During Therapy WFL for tasks assessed/performed           Past Medical History:  Diagnosis Date  . Acid reflux   . Diabetes in pregnancy   . Hypertension   . Seasonal allergies     Past Surgical History:  Procedure Laterality Date  . CESAREAN SECTION    . SHOULDER ARTHROSCOPY WITH BICEPS TENDON REPAIR Left 08/29/2020   Procedure: LEFT SHOULDER ARTHROSCOPY WITH BICEPS TENODESIS;  Surgeon: Tarry Kos, MD;  Location: [facility];  Service: Orthopedics;  Laterality: Left;    There were no vitals filed for this visit.   Subjective Assessment - 11/14/20 1711    Subjective [name] reports weekly gains in AROM and strength.  Limitations are still noted with overhead and behind the back function.    Pertinent History Improving L UE function.  Still limited with reaching, overhead function and lifting.    Limitations House hold activities    How long can you sit comfortably? 2 hours (was 15 minutes)    How long can you stand comfortably? No problem (was 15 minutes)    How long can you walk comfortably? Mile +    Diagnostic tests MRI of shoulder - partial slap tear.    Patient Stated Goals Get shoulder healthy for return to normal activities including work.    Currently in Pain? No/denies    Pain Onset More  than a month ago    Aggravating Factors  Overuse    Pain Relieving Factors Exercise and heat/ice    Effect of Pain on Daily Activities Not yet back to work    Multiple Pain Sites No                             [facility] Adult PT Treatment/Exercise - 11/14/20 0001      Exercises   Exercises Shoulder      Shoulder Exercises: Supine   Protraction Strengthening;20 reps;Both    Protraction Weight (lbs) 3#    External Rotation AROM;Left;20 reps   Slow to comfort   Theraband Level (Shoulder External Rotation) --   10 seconds   Internal Rotation AROM;Left;20 reps   10 seconds   Theraband Level (Shoulder Internal Rotation) --   10 seconds   Flexion AROM;Left;10 reps   10 seconds; protract 1st   Theraband Level (Shoulder Flexion) --   protract 1st 10 second hold     Shoulder Exercises: Sidelying   External Rotation Strengthening;Left;10 reps;Weights;Other (comment)    External Rotation Weight (lbs) 2#      Shoulder Exercises: Standing   External Rotation Strengthening;Left;10 reps    Theraband Level (Shoulder External Rotation) Level 2 (Red)  External Rotation Limitations Within comfortable range    Internal Rotation Strengthening;Left;10 reps;Theraband   10 seconds thumb up the back   Theraband Level (Shoulder Internal Rotation) Level 2 (Red)    Internal Rotation Limitations Within comfortable range    Retraction Strengthening;Both;10 reps;Theraband;Other (comment)   5 seconds shoulder blade pinches   Theraband Level (Shoulder Retraction) Level 2 (Red)    Retraction Limitations 5 second hold    Diagonals --   Posterior capsule stretch (Horizontal adduction) 10X 10 sec   Other Standing Exercises Thumb up the back stretch with scapular retraction (pull to center of back with thumb up) 10X 10 seconds    Other Standing Exercises Biceps with supination 20X 3#                  PT Education - 11/14/20 1712    Education Details Reviewed HEP with emphasis on capsular  stretching multiple times daily along with 3X/week strengthening.    Person(s) Educated Patient    Methods Explanation;Demonstration;Tactile cues;Verbal cues    Comprehension Verbal cues required;Need further instruction;Returned demonstration;Verbalized understanding;Tactile cues required            PT Short Term Goals - 10/31/20 1732      PT SHORT TERM GOAL #1   Title I with initial HEP    Time 4    Period Weeks    Status Achieved    Target Date 09/21/20      PT SHORT TERM GOAL #2   Title Report =/> 50% reduction of overall back pain to allow return to normal activities.    Time 12    Period Weeks    Status Achieved    Target Date 11/09/20      PT SHORT TERM GOAL #3   Period Weeks    Status Achieved    Target Date 11/09/20             PT Long Term Goals - 11/14/20 1713      PT LONG TERM GOAL #1   Title I with long-term HEP.    Time 12    Period Weeks    Status On-going      PT LONG TERM GOAL #2   Title [name] will report L shoulder pain consistently 0-2/10 on the Numeric Pain Rating Scale.    Baseline 4-5/10    Time 12    Period Weeks    Status On-going      PT LONG TERM GOAL #3   Title Improve FOTO score to 67.    Baseline 46 at eval, 55 at last reassessment, 66 on 10/17/2020, 60 at 10/31/20 visit.    Time 12    Period Weeks    Status On-going      PT LONG TERM GOAL #4   Title Improve shoulder AROM for flexion to 170; ER to 90; IR to 60 and horizontal adduction to 40 degrees.    Baseline AROM is 78% of LTG.    Time 12    Period Weeks    Status On-going      PT LONG TERM GOAL #5   Title Improve L shoulder strength as assessed by improve MMT scores.    Baseline Deferred due to just over 3 weeks post-surgery.    Time 12    Period Weeks    Status On-going                 Plan - 11/14/20 1714    Clinical Impression Statement [name]  notes increased HEP compliance has resulted in the expected improved AROM, strength and decrease in pain.   Capsular tightness still needs to be addressed to help with reaching behind the back and overhead function.  Continue POC.    Personal Factors and Comorbidities Comorbidity 3+    Comorbidities HTN, pre- DM, HA's, chronic LBP    Examination-Activity Limitations Reach Overhead;Bend;Caring for Others;Dressing;Hygiene/Grooming;Lift;Sleep    Examination-Participation Restrictions Cleaning;Meal Prep;Laundry    Stability/Clinical Decision Making Stable/Uncomplicated    Rehab Potential Good    PT Frequency 2x / week    PT Duration 4 weeks    PT Treatment/Interventions Patient/family education;Therapeutic activities;Passive range of motion;Therapeutic exercise;Cryotherapy;Neuromuscular re-education;Manual techniques;Vasopneumatic Device    PT Next Visit Plan AROM and strength    PT Home Exercise Plan Access Code: [email or web address].    Consulted and Agree with Plan of Care Patient           Patient will benefit from skilled therapeutic intervention in order to improve the following deficits and impairments:  Decreased range of motion,Obesity,Impaired UE functional use,Pain,Hypomobility,Decreased strength,Decreased activity tolerance,Decreased endurance,Increased edema  Visit Diagnosis: Muscle weakness (generalized)  Stiffness of left shoulder, not elsewhere classified  Chronic pain in left shoulder  Posture abnormality     Problem List Patient Active Problem List   Diagnosis Date Noted  . Subacromial bursitis of left shoulder joint 08/29/2020  . Tendinopathy of left rotator cuff 08/29/2020  . Morbid obesity with BMI of 45.0-49.9, adult ([place]) 08/09/2020  . Superior glenoid labrum lesion of left shoulder 07/10/2020  . Health maintenance examination 03/23/2019  . Prediabetes 12-06-2018  . Strain of back 12-06-2018  . Death of family member 12/06/2018  . Back pain 07/31/2018  . Fatigue 05/31/2018  . Chronic low back pain 11/30/2017  . Shifting sleep-work schedule 06/15/2017  . Paradoxical  insomnia 06/15/2017  . Sleep related headaches 06/15/2017  . Super obese 06/15/2017  . Mood complaints in sleep disorder 06/15/2017  . Plantar fasciitis of left foot 03/01/2014  . Porokeratosis 03/01/2014  . Pain in lower limb 03/01/2014    Farley Ly PT, MPT 11/14/2020, 5:16 PM  [facility] [address] [place], [place], [address] Phone: [phone]   Fax:  [phone]  Name: ZYIAH WITHINGTON MRN: [id number] Date of Birth: 1981-01-06

## 2020-11-21 ENCOUNTER — Encounter: Payer: Self-pay | Admitting: Rehabilitative and Restorative Service Providers"

## 2020-11-21 ENCOUNTER — Ambulatory Visit (INDEPENDENT_AMBULATORY_CARE_PROVIDER_SITE_OTHER): Payer: 59 | Admitting: Rehabilitative and Restorative Service Providers"

## 2020-11-21 ENCOUNTER — Other Ambulatory Visit: Payer: Self-pay

## 2020-11-21 DIAGNOSIS — M6281 Muscle weakness (generalized): Secondary | ICD-10-CM | POA: Diagnosis not present

## 2020-11-21 DIAGNOSIS — M25612 Stiffness of left shoulder, not elsewhere classified: Secondary | ICD-10-CM | POA: Diagnosis not present

## 2020-11-21 DIAGNOSIS — M25512 Pain in left shoulder: Secondary | ICD-10-CM | POA: Diagnosis not present

## 2020-11-21 DIAGNOSIS — G8929 Other chronic pain: Secondary | ICD-10-CM | POA: Diagnosis not present

## 2020-11-21 NOTE — Therapy (Signed)
[place], [place], [address] Phone: [phone]   Fax:  [phone]  Physical Therapy Treatment  Patient Details  Name: Deborah Jacobson MRN: [id number] Date of Birth: 1981/11/08 Referring Provider (PT): Leandrew Koyanagi MD   Encounter Date: 11/21/2020   PT End of Session - 11/21/20 1629    Visit Number 12    Number of Visits 16    Date for PT Re-Evaluation 11/09/20    Authorization Type [employer]    Progress Note Due on Visit 16    PT Start Time 1430    PT Stop Time 1515    PT Time Calculation (min) 45 min    Activity Tolerance Patient tolerated treatment well;No increased pain    Behavior During Therapy WFL for tasks assessed/performed           Past Medical History:  Diagnosis Date  . Acid reflux   . Diabetes in pregnancy   . Hypertension   . Seasonal allergies     Past Surgical History:  Procedure Laterality Date  . CESAREAN SECTION    . SHOULDER ARTHROSCOPY WITH BICEPS TENDON REPAIR Left 08/29/2020   Procedure: LEFT SHOULDER ARTHROSCOPY WITH BICEPS TENODESIS;  Surgeon: Leandrew Koyanagi, MD;  Location: [facility];  Service: Orthopedics;  Laterality: Left;    There were no vitals filed for this visit.   Subjective Assessment - 11/21/20 1627    Subjective Jameia reports she is using her shoulder "almost like (she) normally would."    Pertinent History Improving L UE function.  Still limited with reaching, overhead function and lifting.    Limitations House hold activities    How long can you sit comfortably? 2 hours (was 15 minutes)    How long can you stand comfortably? No problem (was 15 minutes)    How long can you walk comfortably? Mile +    Diagnostic tests MRI of shoulder - partial slap tear.    Patient Stated Goals Get shoulder healthy for return to normal activities including work.    Currently in Pain? Yes    Pain Score 4     Pain Location Shoulder    Pain Orientation Left     Pain Descriptors / Indicators Sore;Aching    Pain Type Surgical pain;Chronic pain    Pain Onset More than a month ago    Pain Frequency Intermittent    Aggravating Factors  Overuse and fatigue    Pain Relieving Factors Exercise and modalities    Effect of Pain on Daily Activities Out of work as a CNA    Multiple Pain Sites No              [facility] PT Assessment - 11/21/20 0001      AROM   Right/Left Shoulder Left    Left Shoulder Flexion 155 Degrees    Left Shoulder Internal Rotation 50 Degrees    Left Shoulder External Rotation 75 Degrees    Left Shoulder Horizontal ADduction 25 Degrees                         [facility] Adult PT Treatment/Exercise - 11/21/20 0001      Exercises   Exercises Shoulder      Shoulder Exercises: Supine   Protraction Strengthening;20 reps;Both    Protraction Weight (lbs) 4#    External Rotation AROM;Left;20 reps   Slow to comfort   Theraband Level (Shoulder External Rotation) --  10 seconds   Internal Rotation AROM;Left;20 reps   10 seconds   Theraband Level (Shoulder Internal Rotation) --   10 seconds   Flexion AROM;Left;10 reps   10 seconds; protract 1st   Theraband Level (Shoulder Flexion) --   protract 1st 10 second hold     Shoulder Exercises: Sidelying   External Rotation Strengthening;Left;10 reps;Weights;Other (comment)    External Rotation Weight (lbs) 2#      Shoulder Exercises: Standing   External Rotation Strengthening;Left;10 reps    Theraband Level (Shoulder External Rotation) Level 2 (Red)    External Rotation Limitations Within comfortable range    Internal Rotation Strengthening;Left;10 reps;Theraband   10 seconds thumb up the back   Theraband Level (Shoulder Internal Rotation) Level 2 (Red)    Internal Rotation Limitations Within comfortable range    Retraction Strengthening;Both;10 reps;Theraband;Other (comment)   5 seconds shoulder blade pinches   Theraband Level (Shoulder Retraction) Level 4 (Blue)     Retraction Limitations 5 second hold    Diagonals --   Posterior capsule stretch (Horizontal adduction) 10X 10 sec   Other Standing Exercises Thumb up the back stretch with scapular retraction (pull to center of back with thumb up) 10X 10 seconds    Other Standing Exercises Biceps with supination 20X 4#                  PT Education - 11/21/20 1628    Education Details Reviewed HEP with emphasis on capsular stretching multiple times/day and strengthening 3X/week.    Person(s) Educated Patient;Spouse    Methods Explanation;Demonstration;Tactile cues;Verbal cues    Comprehension Verbalized understanding;Tactile cues required;Returned demonstration;Need further instruction;Verbal cues required            PT Short Term Goals - 10/31/20 1732      PT SHORT TERM GOAL #1   Title I with initial HEP    Time 4    Period Weeks    Status Achieved    Target Date 09/21/20      PT SHORT TERM GOAL #2   Title Report =/> 50% reduction of overall back pain to allow return to normal activities.    Time 12    Period Weeks    Status Achieved    Target Date 11/09/20      PT SHORT TERM GOAL #3   Period Weeks    Status Achieved    Target Date 11/09/20             PT Long Term Goals - 11/21/20 1629      PT LONG TERM GOAL #1   Title I with long-term HEP.    Time 12    Period Weeks    Status Partially Met      PT LONG TERM GOAL #2   Title Aasiyah will report L shoulder pain consistently 0-2/10 on the Numeric Pain Rating Scale.    Baseline 4-5/10    Time 12    Period Weeks    Status On-going      PT LONG TERM GOAL #3   Title Improve FOTO score to 67.    Baseline 46 at eval, 55 at last reassessment, 66 on 10/17/2020, 60 at 10/31/20 visit.    Time 12    Period Weeks    Status On-going      PT LONG TERM GOAL #4   Title Improve shoulder AROM for flexion to 170; ER to 90; IR to 60 and horizontal adduction to 40 degrees.  Baseline AROM is 78% of LTG.    Time 12    Period  Weeks    Status On-going      PT LONG TERM GOAL #5   Title Improve L shoulder strength as assessed by improve MMT scores.    Baseline Deferred due to just over 3 weeks post-surgery.    Time 12    Period Weeks    Status Partially Met                 Plan - 11/21/20 1630    Clinical Impression Statement HEP compliance is good and AROM was objectively better today.  Continue emphasis on daily capsular stretching and 3X/week strength work to return to work and normal function (RA late January).    Personal Factors and Comorbidities Comorbidity 3+    Comorbidities HTN, pre- DM, HA's, chronic LBP    Examination-Activity Limitations Reach Overhead;Bend;Caring for Others;Dressing;Hygiene/Grooming;Lift;Sleep    Examination-Participation Restrictions Cleaning;Meal Prep;Laundry    Stability/Clinical Decision Making Stable/Uncomplicated    Rehab Potential Good    PT Frequency 2x / week    PT Duration 4 weeks    PT Treatment/Interventions Patient/family education;Therapeutic activities;Passive range of motion;Therapeutic exercise;Cryotherapy;Neuromuscular re-education;Manual techniques;Vasopneumatic Device    PT Next Visit Plan AROM and strength    PT Home Exercise Plan Access Code: [email or web address].    Consulted and Agree with Plan of Care Patient           Patient will benefit from skilled therapeutic intervention in order to improve the following deficits and impairments:  Decreased range of motion,Obesity,Impaired UE functional use,Pain,Hypomobility,Decreased strength,Decreased activity tolerance,Decreased endurance,Increased edema  Visit Diagnosis: Muscle weakness (generalized)  Stiffness of left shoulder, not elsewhere classified  Chronic pain in left shoulder     Problem List Patient Active Problem List   Diagnosis Date Noted  . Subacromial bursitis of left shoulder joint 08/29/2020  . Tendinopathy of left rotator cuff 08/29/2020  . Morbid obesity with BMI of 45.0-49.9, adult  ([place]) 08/09/2020  . Superior glenoid labrum lesion of left shoulder 07/10/2020  . Health maintenance examination 03/23/2019  . Prediabetes 09-Dec-2018  . Strain of back 09-Dec-2018  . Death of family member 12-09-2018  . Back pain 07/31/2018  . Fatigue 05/31/2018  . Chronic low back pain 11/30/2017  . Shifting sleep-work schedule 06/15/2017  . 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 Sleep related headaches 06/15/2017  . Super obese 06/15/2017  . Mood complaints in sleep disorder 06/15/2017  . Plantar fasciitis of left foot 03/01/2014  . Porokeratosis 03/01/2014  . Pain in lower limb 03/01/2014    Farley Ly PT, MPT 11/21/2020, 4:32 PM  [facility] [address] [place], [place], [address] Phone: [phone]   Fax:  [phone]  Name: MIDGE MOMON MRN: [id number] Date of Birth: [birth year]

## 2020-11-27 ENCOUNTER — Encounter: Payer: Self-pay | Admitting: Nurse Practitioner

## 2020-11-27 ENCOUNTER — Other Ambulatory Visit: Payer: Self-pay | Admitting: Nurse Practitioner

## 2020-11-27 DIAGNOSIS — N9489 Other specified conditions associated with female genital organs and menstrual cycle: Secondary | ICD-10-CM

## 2020-11-28 ENCOUNTER — Telehealth: Payer: 59 | Admitting: Family

## 2020-11-28 ENCOUNTER — Other Ambulatory Visit: Payer: Self-pay

## 2020-11-28 ENCOUNTER — Encounter: Payer: Self-pay | Admitting: Nurse Practitioner

## 2020-11-28 ENCOUNTER — Telehealth (INDEPENDENT_AMBULATORY_CARE_PROVIDER_SITE_OTHER): Payer: 59 | Admitting: Nurse Practitioner

## 2020-11-28 DIAGNOSIS — J029 Acute pharyngitis, unspecified: Secondary | ICD-10-CM | POA: Diagnosis not present

## 2020-11-28 DIAGNOSIS — G4700 Insomnia, unspecified: Secondary | ICD-10-CM | POA: Diagnosis not present

## 2020-11-28 DIAGNOSIS — R6883 Chills (without fever): Secondary | ICD-10-CM | POA: Diagnosis not present

## 2020-11-28 MED ORDER — AMOXICILLIN 500 MG PO CAPS: 500.0000 mg | ORAL_CAPSULE | Freq: Two times a day (BID) | ORAL | 0 refills | Status: DC

## 2020-11-28 MED ORDER — TRAZODONE HCL 50 MG PO TABS: 50.0000 mg | ORAL_TABLET | Freq: Every evening | ORAL | 1 refills | Status: DC | PRN

## 2020-11-28 NOTE — Patient Instructions (Addendum)
Take aswhaganda to help with sleep and anxiety   Do not take tramadol and trazadone together

## 2020-11-28 NOTE — Progress Notes (Signed)

## 2020-11-28 NOTE — Progress Notes (Signed)
Virtual Visit via Mychart    This visit type was conducted due to national recommendations for restrictions regarding the COVID-19 Pandemic (e.g. social distancing) in an effort to limit this patient's exposure and mitigate transmission in our community.  Due to her co-morbid illnesses, this patient is at least at moderate risk for complications without adequate follow up.  This format is felt to be most appropriate for this patient at this time.  All issues noted in this document were discussed and addressed.  A limited physical exam was performed with this format.    This visit type was conducted due to national recommendations for restrictions regarding the COVID-19 Pandemic (e.g. social distancing) in an effort to limit this patient's exposure and mitigate transmission in our community.  Patients identity confirmed using two different identifiers.  This format is felt to be most appropriate for this patient at this time.  All issues noted in this document were discussed and addressed.  No physical exam was performed (except for noted visual exam findings with Video Visits).    Date:  11/28/2020   ID:  Deborah Jacobson, DOB May 29, 1981, MRN [id number]  Patient Location:  Car - spoke with Deborah Jacobson  Provider location:   Office    Chief Complaint:  Chills   History of Present Illness:    Deborah Jacobson is a 40 y.o. female who presents via video conferencing for a telehealth visit today.    The patient does not have symptoms concerning for COVID-19 infection (fever, chills, cough, or new shortness of breath).   Difficultly swallowing and chills where he will have goose bumps. She does have a family history of thyroid issues.  Denies feeling any lumps or bumps.     Past Medical History:  Diagnosis Date  . Acid reflux   . Diabetes in pregnancy   . Hypertension   . Seasonal allergies    Past Surgical History:  Procedure Laterality Date  . CESAREAN SECTION    . SHOULDER  ARTHROSCOPY WITH BICEPS TENDON REPAIR Left 08/29/2020   Procedure: LEFT SHOULDER ARTHROSCOPY WITH BICEPS TENODESIS;  Surgeon: Tarry Kos, MD;  Location: [facility];  Service: Orthopedics;  Laterality: Left;     Current Meds  Medication Sig  . albuterol (VENTOLIN HFA) 108 (90 Base) MCG/ACT inhaler INHALE 2 PUFFS INTO THE LUNGS EVERY 6 HOURS AS NEEDED FOR WHEEZING OR SHORTNESS OF BREATH  . diclofenac Sodium (VOLTAREN) 1 % GEL Apply 2 g topically 4 (four) times daily.  . diphenhydrAMINE (BENADRYL) 25 MG tablet Take 25 mg by mouth every 6 (six) hours as needed for allergies.  . hydrochlorothiazide (HYDRODIURIL) 12.5 MG tablet Take 1 tablet (12.5 mg total) by mouth daily.  . hydrOXYzine (ATARAX/VISTARIL) 25 MG tablet Take 1 tablet (25 mg total) by mouth 3 (three) times daily as needed.  . meloxicam (MOBIC) 15 MG tablet Take 0.5-1 tablets (7.5-15 mg total) by mouth daily as needed for pain.  . Semaglutide, 1 MG/DOSE, (OZEMPIC, 1 MG/DOSE,) 4 MG/3ML SOPN Inject 1 mg into the skin once a week.  . traMADol (ULTRAM) 50 MG tablet Take 1 tablet (50 mg total) by mouth 3 (three) times daily as needed.  . traZODone (DESYREL) 50 MG tablet Take 1 tablet (50 mg total) by mouth at bedtime as needed for sleep.     Allergies:   Latex   Social History   Tobacco Use  . Smoking status: Former Games developer  . Smokeless tobacco: Never Used  Vaping  Use  . Vaping Use: Never used  Substance Use Topics  . Alcohol use: No  . Drug use: No     Family Hx: The patient's family history includes Diabetes in her mother; Hypertension in her mother; Liver disease in her mother.  ROS:   Please see the history of present illness.    Review of Systems  Constitutional: Positive for chills. Negative for fever, malaise/fatigue and weight loss.  Respiratory: Negative.   Cardiovascular: Negative.   Neurological: Negative for dizziness and tingling.  Psychiatric/Behavioral: Negative.     All other systems  reviewed and are negative.   Labs/Other Tests and Data Reviewed:    Recent Labs: 11/05/2020: ALT 11; BUN 12; Creatinine, Ser 0.77; Hemoglobin 12.1; Platelets 328; Potassium 3.9; Sodium 139   Recent Lipid Panel Lab Results  Component Value Date/Time   CHOL 162 11/05/2020 12:02 PM   TRIG 42 11/05/2020 12:02 PM   HDL 57 11/05/2020 12:02 PM   CHOLHDL 2.8 11/05/2020 12:02 PM   LDLCALC 96 11/05/2020 12:02 PM    Wt Readings from Last 3 Encounters:  11/05/20 207 lb 3.2 oz (94 kg)  08/29/20 209 lb 3.5 oz (94.9 kg)  08/09/20 203 lb (92.1 kg)     Exam:    Vital Signs:  LMP 10/29/2020     Physical Exam Constitutional:      General: She is not in acute distress.    Appearance: Normal appearance. She is obese.  Neck:     Comments: Via video no abnormal findings. Pulmonary:     Effort: Pulmonary effort is normal.  Musculoskeletal:     Cervical back: Normal range of motion.  Neurological:     Mental Status: She is alert.  Psychiatric:        Mood and Affect: Mood normal.        Behavior: Behavior normal.        Thought Content: Thought content normal.        Judgment: Judgment normal.     ASSESSMENT & PLAN:    1. Chills  During the time this could have been related to having a cold she was tested for covid and was negative  Would like to be checked for thyroid problems due to a family history  Will come for labs tomorrow, pending results will add labs - TSH; Future  2. Insomnia, unspecified type  Tylenol pm not effective would like to try trazodone again which was effective  She is advised to not take trazodone and tramadol together - traZODone (DESYREL) 50 MG tablet; Take 1 tablet (50 mg total) by mouth at bedtime as needed for sleep.  Dispense: 30 tablet; Refill: 1   COVID-19 Education: The signs and symptoms of COVID-19 were discussed with the patient and how to seek care for testing (follow up with PCP or arrange E-visit).  The importance of social distancing  was discussed today.  Patient Risk:   After full review of this patients clinical status, I feel that they are at least moderate risk at this time.  Time:   Today, I have spent  minutes/ seconds with the patient with telehealth technology discussing above diagnoses.     Medication Adjustments/Labs and Tests Ordered: Current medicines are reviewed at length with the patient today.  Concerns regarding medicines are outlined above.   Tests Ordered: Orders Placed This Encounter  Procedures  . TSH    Medication Changes: Meds ordered this encounter  Medications  . traZODone (DESYREL) 50 MG tablet  Sig: Take 1 tablet (50 mg total) by mouth at bedtime as needed for sleep.    Dispense:  30 tablet    Refill:  1    Disposition:  Follow up prn  Signed, Arnette Felts, FNP

## 2020-11-29 ENCOUNTER — Other Ambulatory Visit: Payer: Self-pay

## 2020-11-29 ENCOUNTER — Other Ambulatory Visit: Payer: 59

## 2020-11-29 DIAGNOSIS — R6883 Chills (without fever): Secondary | ICD-10-CM

## 2020-11-30 ENCOUNTER — Encounter: Payer: 59 | Admitting: Rehabilitative and Restorative Service Providers"

## 2020-11-30 LAB — TSH: TSH: 1.23 u[IU]/mL (ref 0.450–4.500)

## 2020-12-05 ENCOUNTER — Encounter: Payer: 59 | Admitting: Rehabilitative and Restorative Service Providers"

## 2020-12-07 ENCOUNTER — Encounter: Payer: Self-pay | Admitting: Rehabilitative and Restorative Service Providers"

## 2020-12-07 ENCOUNTER — Other Ambulatory Visit: Payer: Self-pay

## 2020-12-07 ENCOUNTER — Ambulatory Visit (INDEPENDENT_AMBULATORY_CARE_PROVIDER_SITE_OTHER): Payer: 59 | Admitting: Rehabilitative and Restorative Service Providers"

## 2020-12-07 DIAGNOSIS — M25512 Pain in left shoulder: Secondary | ICD-10-CM | POA: Diagnosis not present

## 2020-12-07 DIAGNOSIS — G8929 Other chronic pain: Secondary | ICD-10-CM | POA: Diagnosis not present

## 2020-12-07 DIAGNOSIS — M6281 Muscle weakness (generalized): Secondary | ICD-10-CM

## 2020-12-07 DIAGNOSIS — M25612 Stiffness of left shoulder, not elsewhere classified: Secondary | ICD-10-CM | POA: Diagnosis not present

## 2020-12-07 NOTE — Therapy (Signed)
[facility] [address] [place], [place], [address] Phone: [phone]   Fax:  [phone]  Physical Therapy Treatment  Patient Details  Name: Deborah Jacobson MRN: [id number] Date of Birth: 1981/08/13 Referring Provider (PT): Leandrew Koyanagi MD   Encounter Date: 12/07/2020   PT End of Session - 12/07/20 1720    Visit Number 13    Number of Visits 16    Date for PT Re-Evaluation 11/09/20    Authorization Type [employer]    Progress Note Due on Visit 16    PT Start Time 4010    PT Stop Time 1231    PT Time Calculation (min) 38 min    Activity Tolerance Patient tolerated treatment well;No increased pain    Behavior During Therapy WFL for tasks assessed/performed           Past Medical History:  Diagnosis Date  . Acid reflux   . Diabetes in pregnancy   . Hypertension   . Seasonal allergies     Past Surgical History:  Procedure Laterality Date  . CESAREAN SECTION    . SHOULDER ARTHROSCOPY WITH BICEPS TENDON REPAIR Left 08/29/2020   Procedure: LEFT SHOULDER ARTHROSCOPY WITH BICEPS TENODESIS;  Surgeon: Leandrew Koyanagi, MD;  Location: [place];  Service: Orthopedics;  Laterality: Left;    There were no vitals filed for this visit.   Subjective Assessment - 12/07/20 1717    Subjective Zuria feels better weekly and is "almost normal."    Pertinent History Improving L UE function.  Still limited with reaching, overhead function and lifting.    Limitations House hold activities    How long can you sit comfortably? 2 hours (was 15 minutes)    How long can you stand comfortably? No problem (was 15 minutes)    How long can you walk comfortably? Mile +    Diagnostic tests MRI of shoulder - partial slap tear.    Patient Stated Goals Get shoulder healthy for return to normal activities including work.    Currently in Pain? No/denies    Pain Onset More than a month ago              [facility] PT Assessment - 12/07/20 0001       AROM   Right/Left Shoulder Left    Left Shoulder Flexion 160 Degrees    Left Shoulder Internal Rotation 60 Degrees    Left Shoulder External Rotation 80 Degrees    Left Shoulder Horizontal ADduction 30 Degrees                         [facility] Adult PT Treatment/Exercise - 12/07/20 0001      Exercises   Exercises Shoulder      Shoulder Exercises: Supine   Protraction Strengthening;20 reps;Both    Protraction Weight (lbs) 4#    External Rotation AROM;Left;20 reps   Slow to comfort   Theraband Level (Shoulder External Rotation) --   10 seconds   Internal Rotation AROM;Left;20 reps   10 seconds   Theraband Level (Shoulder Internal Rotation) --   10 seconds   Flexion AROM;Left;10 reps   10 seconds; protract 1st   Theraband Level (Shoulder Flexion) --   protract 1st 10 second hold     Shoulder Exercises: Standing   External Rotation Strengthening;Left;10 reps    Theraband Level (Shoulder External Rotation) Level 2 (Red)    External Rotation Limitations Within comfortable range  Internal Rotation Strengthening;Left;10 reps;Theraband   10 seconds thumb up the back   Theraband Level (Shoulder Internal Rotation) Level 2 (Red)    Internal Rotation Limitations Within comfortable range    Retraction Strengthening;Both;10 reps;Theraband;Other (comment)   5 seconds shoulder blade pinches   Theraband Level (Shoulder Retraction) Level 4 (Blue)    Retraction Limitations 5 second hold    Diagonals --   Posterior capsule stretch (Horizontal adduction) 10X 10 sec   Other Standing Exercises Thumb up the back stretch with scapular retraction (pull to center of back with thumb up) 10X 10 seconds                  PT Education - 12/07/20 1718    Education Details Updated and reviewed HEP with emphasis on AROM.    Person(s) Educated Patient    Methods Explanation;Demonstration;Tactile cues;Verbal cues;Handout    Comprehension Verbalized understanding;Need further  instruction;Returned demonstration;Tactile cues required;Verbal cues required            PT Short Term Goals - 12/07/20 1718      PT SHORT TERM GOAL #1   Title I with initial HEP    Time 4    Period Weeks    Status Achieved    Target Date 09/21/20      PT SHORT TERM GOAL #2   Title Report =/> 50% reduction of overall back pain to allow return to normal activities.    Time 12    Period Weeks    Status Achieved    Target Date 11/09/20      PT SHORT TERM GOAL #3   Period Weeks    Status Achieved    Target Date 11/09/20             PT Long Term Goals - 12/07/20 1719      PT LONG TERM GOAL #1   Title I with long-term HEP.    Time 12    Period Weeks    Status Achieved      PT LONG TERM GOAL #2   Title Toriana will report L shoulder pain consistently 0-2/10 on the Numeric Pain Rating Scale.    Baseline 4-5/10    Time 12    Period Weeks    Status Achieved      PT LONG TERM GOAL #3   Title Improve FOTO score to 67.    Baseline 46 at eval, 55 at last reassessment, 66 on 10/17/2020, 60 at 10/31/20 visit.    Time 12    Period Weeks    Status On-going      PT LONG TERM GOAL #4   Title Improve shoulder AROM for flexion to 170; ER to 90; IR to 60 and horizontal adduction to 40 degrees.    Baseline AROM is > 85% of LTG.    Time 12    Period Weeks    Status On-going      PT LONG TERM GOAL #5   Title Improve L shoulder strength as assessed by improve MMT scores.    Baseline Deferred due to just over 3 weeks post-surgery.    Time 12    Period Weeks    Status Partially Met                 Plan - 12/07/20 1721    Clinical Impression Statement Keilyn is nearly ready for independent rehabilitation.  Remaining AROM impairments are being addressed and she should meet LTGs in 2 weeks.  Follow-up RA  in 2 weeks to assess readiness for DC.    Personal Factors and Comorbidities Comorbidity 3+    Comorbidities HTN, pre- DM, HA's, chronic LBP    Examination-Activity  Limitations Reach Overhead;Bend;Caring for Others;Dressing;Hygiene/Grooming;Lift;Sleep    Examination-Participation Restrictions Cleaning;Meal Prep;Laundry    Stability/Clinical Decision Making Stable/Uncomplicated    Rehab Potential Good    PT Frequency 2x / week    PT Duration 4 weeks    PT Treatment/Interventions Patient/family education;Therapeutic activities;Passive range of motion;Therapeutic exercise;Cryotherapy;Neuromuscular re-education;Manual techniques;Vasopneumatic Device    PT Next Visit Plan AROM and strength    PT Home Exercise Plan Access Code: [email or web address].    Consulted and Agree with Plan of Care Patient           Patient will benefit from skilled therapeutic intervention in order to improve the following deficits and impairments:  Decreased range of motion,Obesity,Impaired UE functional use,Pain,Hypomobility,Decreased strength,Decreased activity tolerance,Decreased endurance,Increased edema  Visit Diagnosis: Stiffness of left shoulder, not elsewhere classified  Chronic pain in left shoulder  Muscle weakness (generalized)     Problem List Patient Active Problem List   Diagnosis Date Noted  . Subacromial bursitis of left shoulder joint 08/29/2020  . Tendinopathy of left rotator cuff 08/29/2020  . Morbid obesity with BMI of 45.0-49.9, adult ([place]) 08/09/2020  . Superior glenoid labrum lesion of left shoulder 07/10/2020  . Health maintenance examination 03/23/2019  . Prediabetes December 15, 2018  . Strain of back 2018-12-15  . Death of family member 12-15-18  . Back pain 07/31/2018  . Fatigue 05/31/2018  . Chronic low back pain 11/30/2017  . Shifting sleep-work schedule 06/15/2017  . 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 Sleep related headaches 06/15/2017  . Super obese 06/15/2017  . Mood complaints in sleep disorder 06/15/2017  . Plantar fasciitis of left foot 03/01/2014  . Porokeratosis 03/01/2014  . Pain in lower limb 03/01/2014    Farley Ly PT,  MPT 12/07/2020, [facility] [address] [place], [place], [address] Phone: [phone]   Fax:  [phone]  Name: PRIYAH SCHMUCK MRN: [id number] Date of Birth: 12-14-1980

## 2020-12-07 NOTE — Patient Instructions (Signed)
Updated HEP with emphasis on supine stretches 2X/day to normalize AROM (see objective).

## 2020-12-11 ENCOUNTER — Encounter: Payer: Self-pay | Admitting: Orthopaedic Surgery

## 2020-12-12 ENCOUNTER — Other Ambulatory Visit: Payer: Self-pay | Admitting: Physician Assistant

## 2020-12-12 ENCOUNTER — Ambulatory Visit
Admission: RE | Admit: 2020-12-12 | Discharge: 2020-12-12 | Disposition: A | Discharge status: Home / Self Care | Payer: 59 | Source: Ambulatory Visit | Attending: Nurse Practitioner | Admitting: Nurse Practitioner

## 2020-12-12 DIAGNOSIS — N9489 Other specified conditions associated with female genital organs and menstrual cycle: Secondary | ICD-10-CM

## 2020-12-12 MED ORDER — TRAMADOL HCL 50 MG PO TABS: 50.0000 mg | ORAL_TABLET | Freq: Three times a day (TID) | ORAL | 0 refills | Status: DC | PRN

## 2020-12-12 NOTE — Telephone Encounter (Signed)
Sent in

## 2020-12-14 ENCOUNTER — Encounter: Payer: 59 | Admitting: Rehabilitative and Restorative Service Providers"

## 2020-12-21 ENCOUNTER — Other Ambulatory Visit: Payer: Self-pay

## 2020-12-21 ENCOUNTER — Encounter: Payer: Self-pay | Admitting: Rehabilitative and Restorative Service Providers"

## 2020-12-21 ENCOUNTER — Ambulatory Visit (INDEPENDENT_AMBULATORY_CARE_PROVIDER_SITE_OTHER): Payer: 59 | Admitting: Rehabilitative and Restorative Service Providers"

## 2020-12-21 DIAGNOSIS — M6281 Muscle weakness (generalized): Secondary | ICD-10-CM

## 2020-12-21 DIAGNOSIS — G8929 Other chronic pain: Secondary | ICD-10-CM | POA: Diagnosis not present

## 2020-12-21 DIAGNOSIS — M25612 Stiffness of left shoulder, not elsewhere classified: Secondary | ICD-10-CM | POA: Diagnosis not present

## 2020-12-21 DIAGNOSIS — M25512 Pain in left shoulder: Secondary | ICD-10-CM | POA: Diagnosis not present

## 2020-12-21 NOTE — Therapy (Addendum)
[facility] [address] [place], [place], [address] Phone: [phone]   Fax:  [phone]  Physical Therapy Treatment/Reassessment/Discharge  Patient Details  Name: Deborah Jacobson MRN: [id number] Date of Birth: [birth year] Referring Provider (PT): Leandrew Koyanagi MD  PHYSICAL THERAPY DISCHARGE SUMMARY  Visits from Start of Care: 14  Current functional level related to goals / functional outcomes: Excellent   Remaining deficits: See note-mild   Education / Equipment: Updated HEP Plan: Patient agrees to discharge.  Patient goals were met. Patient is being discharged due to meeting the stated rehab goals.  ?????     Encounter Date: 12/21/2020   PT End of Session - 12/21/20 1752    Visit Number 14    Number of Visits 16    Date for PT Re-Evaluation 11/09/20    Authorization Type [employer]    Progress Note Due on Visit 16    PT Start Time 1151    PT Stop Time 1231    PT Time Calculation (min) 40 min    Activity Tolerance Patient tolerated treatment well;No increased pain    Behavior During Therapy WFL for tasks assessed/performed           Past Medical History:  Diagnosis Date  . Acid reflux   . Diabetes in pregnancy   . Hypertension   . Seasonal allergies     Past Surgical History:  Procedure Laterality Date  . CESAREAN SECTION    . SHOULDER ARTHROSCOPY WITH BICEPS TENDON REPAIR Left 08/29/2020   Procedure: LEFT SHOULDER ARTHROSCOPY WITH BICEPS TENODESIS;  Surgeon: Leandrew Koyanagi, MD;  Location: [facility];  Service: Orthopedics;  Laterality: Left;    There were no vitals filed for this visit.   Subjective Assessment - 12/21/20 1157    Subjective Sleep is good.  Function is good.  A little tightness overhead and behind the back.    Pertinent History Improving L UE function.  Still limited with reaching, overhead function and lifting.    Limitations House hold activities    How long can you sit  comfortably? 2 hours (was 15 minutes)    How long can you stand comfortably? No problem (was 15 minutes)    How long can you walk comfortably? Mile +    Diagnostic tests MRI of shoulder - partial slap tear.    Patient Stated Goals Get shoulder healthy for return to normal activities including work.    Currently in Pain? No/denies    Pain Onset More than a month ago    Multiple Pain Sites No              [facility] PT Assessment - 12/21/20 0001      AROM   Right/Left Shoulder Left    Left Shoulder Flexion 160 Degrees    Left Shoulder Internal Rotation 55 Degrees    Left Shoulder External Rotation 80 Degrees    Left Shoulder Horizontal ADduction 30 Degrees                         [facility] Adult PT Treatment/Exercise - 12/21/20 0001      Exercises   Exercises Shoulder      Shoulder Exercises: Supine   Protraction Strengthening;20 reps;Both    Protraction Weight (lbs) 4#    External Rotation AROM;Left;20 reps   Slow to comfort   Theraband Level (Shoulder External Rotation) --   10 seconds   Internal Rotation AROM;Left;20 reps  10 seconds   Theraband Level (Shoulder Internal Rotation) --   10 seconds   Flexion AROM;Left;10 reps   10 seconds; protract 1st   Flexion Limitations Protraction 1st 10 seconds      Shoulder Exercises: Standing   External Rotation Strengthening;Left;10 reps    Theraband Level (Shoulder External Rotation) Level 2 (Red)    External Rotation Limitations Within comfortable range    Internal Rotation Strengthening;Left;10 reps;Theraband   10 seconds thumb up the back   Theraband Level (Shoulder Internal Rotation) Level 2 (Red)    Internal Rotation Limitations Within comfortable range    Retraction --   5 seconds shoulder blade pinches   Theraband Level (Shoulder Retraction) --    Retraction Limitations --    Diagonals --    Other Standing Exercises Thumb up the back stretch with scapular retraction (pull to center of back with thumb up) 10X 10  seconds                  PT Education - 12/21/20 1749    Education Details Reviewed exam findings and maintenance HEP.    Person(s) Educated Patient    Methods Explanation;Demonstration;Verbal cues;Handout    Comprehension Verbalized understanding;Returned demonstration;Verbal cues required            PT Short Term Goals - 12/21/20 1750      PT SHORT TERM GOAL #1   Title I with initial HEP    Time 4    Period Weeks    Status Achieved    Target Date 09/21/20      PT SHORT TERM GOAL #2   Title Report =/> 50% reduction of overall back pain to allow return to normal activities.    Time 12    Period Weeks    Status Achieved    Target Date 11/09/20      PT SHORT TERM GOAL #3   Period Weeks    Status Achieved    Target Date 11/09/20             PT Long Term Goals - 12/21/20 1750      PT LONG TERM GOAL #1   Title I with long-term HEP.    Time 12    Period Weeks    Status Achieved      PT LONG TERM GOAL #2   Title Karielle will report L shoulder pain consistently 0-2/10 on the Numeric Pain Rating Scale.    Baseline 4-5/10    Time 12    Period Weeks    Status Achieved      PT LONG TERM GOAL #3   Title Improve FOTO score to 67.    Baseline 46 at eval, 55 at last reassessment, 69 on 12/21/2020    Time 12    Period Weeks    Status Achieved      PT LONG TERM GOAL #4   Title Improve shoulder AROM for flexion to 170; ER to 90; IR to 60 and horizontal adduction to 40 degrees.    Baseline AROM is > 87% of LTG.    Time 12    Period Weeks    Status Achieved      PT LONG TERM GOAL #5   Title Improve L shoulder strength as assessed by improve MMT scores.    Baseline Deferred due to just over 3 weeks post-surgery.    Time 12    Period Weeks    Status Achieved                   Plan - 12/21/20 1753    Clinical Impression Statement [name] is "almost completely" pain-free.  AROM is 87% of the uninvolved R.  Strength is very good and remaining AROM and  strength shortfalls are being addressed with her long-term HEP.  She agrees to discharge and transfer into independent rehabilitation.    Personal Factors and Comorbidities Comorbidity 3+    Comorbidities HTN, pre- DM, HA's, chronic LBP    Examination-Activity Limitations Reach Overhead;Bend;Caring for Others;Dressing;Hygiene/Grooming;Lift;Sleep    Examination-Participation Restrictions Cleaning;Meal Prep;Laundry    Stability/Clinical Decision Making Stable/Uncomplicated    Rehab Potential Good    PT Frequency 2x / week    PT Duration 4 weeks    PT Treatment/Interventions Patient/family education;Therapeutic activities;Passive range of motion;Therapeutic exercise;Cryotherapy;Neuromuscular re-education;Manual techniques;Vasopneumatic Device    PT Next Visit Plan AROM and strength    PT Home Exercise Plan Access Code: [email or web address].    Consulted and Agree with Plan of Care Patient           Patient will benefit from skilled therapeutic intervention in order to improve the following deficits and impairments:  Decreased range of motion,Obesity,Impaired UE functional use,Pain,Hypomobility,Decreased strength,Decreased activity tolerance,Decreased endurance,Increased edema  Visit Diagnosis: Stiffness of left shoulder, not elsewhere classified  Muscle weakness (generalized)  Chronic pain in left shoulder     Problem List Patient Active Problem List   Diagnosis Date Noted  . Subacromial bursitis of left shoulder joint 08/29/2020  . Tendinopathy of left rotator cuff 08/29/2020  . Morbid obesity with BMI of 45.0-49.9, adult ([place]) 08/09/2020  . Superior glenoid labrum lesion of left shoulder 07/10/2020  . Health maintenance examination 03/23/2019  . Prediabetes 2018/12/04  . Strain of back 12-04-2018  . Death of family member 2018-12-04  . Back pain 07/31/2018  . Fatigue 05/31/2018  . Chronic low back pain 11/30/2017  . Shifting sleep-work schedule 06/15/2017  . Paradoxical insomnia  06/15/2017  . Sleep related headaches 06/15/2017  . Super obese 06/15/2017  . Mood complaints in sleep disorder 06/15/2017  . Plantar fasciitis of left foot 03/01/2014  . Porokeratosis 03/01/2014  . Pain in lower limb 03/01/2014    Farley Ly PT, MPT 12/21/2020, 5:55 PM  [facility] [address] [place], [place], [address] Phone: [phone]   Fax:  [phone]  Name: ASHTON SABINE MRN: [id number] Date of Birth: Aug 03, 1981

## 2021-01-01 ENCOUNTER — Other Ambulatory Visit: Payer: Self-pay | Admitting: Nurse Practitioner

## 2021-01-01 DIAGNOSIS — R7303 Prediabetes: Secondary | ICD-10-CM

## 2021-01-09 ENCOUNTER — Ambulatory Visit (INDEPENDENT_AMBULATORY_CARE_PROVIDER_SITE_OTHER): Payer: 59 | Admitting: Advanced Practice Midwife

## 2021-01-09 ENCOUNTER — Encounter: Payer: Self-pay | Admitting: Advanced Practice Midwife

## 2021-01-09 ENCOUNTER — Other Ambulatory Visit (HOSPITAL_COMMUNITY)
Admission: RE | Admit: 2021-01-09 | Discharge: 2021-01-09 | Disposition: A | Discharge status: Home / Self Care | Payer: 59 | Source: Ambulatory Visit | Attending: Advanced Practice Midwife | Admitting: Advanced Practice Midwife

## 2021-01-09 ENCOUNTER — Other Ambulatory Visit: Payer: Self-pay

## 2021-01-09 VITALS — BP 136/92 | HR 86 | Ht <= 58 in | Wt 210.0 lb

## 2021-01-09 DIAGNOSIS — N898 Other specified noninflammatory disorders of vagina: Secondary | ICD-10-CM | POA: Insufficient documentation

## 2021-01-09 DIAGNOSIS — Z01419 Encounter for gynecological examination (general) (routine) without abnormal findings: Secondary | ICD-10-CM

## 2021-01-09 NOTE — Progress Notes (Signed)
Has been getting pap at [facility] dept  Has an odor

## 2021-01-09 NOTE — Patient Instructions (Signed)
Preventive Care 41-41 Years Old, Female Preventive care refers to lifestyle choices and visits with your health care provider that can promote health and wellness. This includes:  A yearly physical exam. This is also called an annual wellness visit.  Regular dental and eye exams.  Immunizations.  Screening for certain conditions.  Healthy lifestyle choices, such as: ? Eating a healthy diet. ? Getting regular exercise. ? Not using drugs or products that contain nicotine and tobacco. ? Limiting alcohol use. What can I expect for my preventive care visit? Physical exam Your health care provider will check your:  Height and weight. These may be used to calculate your BMI (body mass index). BMI is a measurement that tells if you are at a healthy weight.  Heart rate and blood pressure.  Body temperature.  Skin for abnormal spots. Counseling Your health care provider may ask you questions about your:  Past medical problems.  Family's medical history.  Alcohol, tobacco, and drug use.  Emotional well-being.  Home life and relationship well-being.  Sexual activity.  Diet, exercise, and sleep habits.  Work and work Statistician.  Access to firearms.  Method of birth control.  Menstrual cycle.  Pregnancy history. What immunizations do I need? Vaccines are usually given at various ages, according to a schedule. Your health care provider will recommend vaccines for you based on your age, medical history, and lifestyle or other factors, such as travel or where you work.   What tests do I need? Blood tests  Lipid and cholesterol levels. These may be checked every 5 years, or more often if you are over 3 years old.  Hepatitis C test.  Hepatitis B test. Screening  Lung cancer screening. You may have this screening every year starting at age 41 if you have a 30-pack-year history of smoking and currently smoke or have quit within the past 15 years.  Colorectal cancer  screening. ? All adults should have this screening starting at age 41 and continuing until age 17. ? Your health care provider may recommend screening at age 41 if you are at increased risk. ? You will have tests every 1-10 years, depending on your results and the type of screening test.  Diabetes screening. ? This is done by checking your blood sugar (glucose) after you have not eaten for a while (fasting). ? You may have this done every 1-3 years.  Mammogram. ? This may be done every 1-2 years. ? Talk with your health care provider about when you should start having regular mammograms. This may depend on whether you have a family history of breast cancer.  BRCA-related cancer screening. This may be done if you have a family history of breast, ovarian, tubal, or peritoneal cancers.  Pelvic exam and Pap test. ? This may be done every 3 years starting at age 41. ? Starting at age 11, this may be done every 5 years if you have a Pap test in combination with an HPV test. Other tests  STD (sexually transmitted disease) testing, if you are at risk.  Bone density scan. This is done to screen for osteoporosis. You may have this scan if you are at high risk for osteoporosis. Talk with your health care provider about your test results, treatment options, and if necessary, the need for more tests. Follow these instructions at home: Eating and drinking  Eat a diet that includes fresh fruits and vegetables, whole grains, lean protein, and low-fat dairy products.  Take vitamin and mineral supplements  as recommended by your health care provider.  Do not drink alcohol if: ? Your health care provider tells you not to drink. ? You are pregnant, may be pregnant, or are planning to become pregnant.  If you drink alcohol: ? Limit how much you have to 0-1 drink a day. ? Be aware of how much alcohol is in your drink. In the [place] one drink equals one 12 oz bottle of beer (355 mL), one 5 oz glass of  wine (148 mL), or one 1 oz glass of hard liquor (44 mL).   Lifestyle  Take daily care of your teeth and gums. Brush your teeth every morning and night with fluoride toothpaste. Floss one time each day.  Stay active. Exercise for at least 30 minutes 5 or more days each week.  Do not use any products that contain nicotine or tobacco, such as cigarettes, e-cigarettes, and chewing tobacco. If you need help quitting, ask your health care provider.  Do not use drugs.  If you are sexually active, practice safe sex. Use a condom or other form of protection to prevent STIs (sexually transmitted infections).  If you do not wish to become pregnant, use a form of birth control. If you plan to become pregnant, see your health care provider for a prepregnancy visit.  If told by your health care provider, take low-dose aspirin daily starting at age 41.  Find healthy ways to cope with stress, such as: ? Meditation, yoga, or listening to music. ? Journaling. ? Talking to a trusted person. ? Spending time with friends and family. Safety  Always wear your seat belt while driving or riding in a vehicle.  Do not drive: ? If you have been drinking alcohol. Do not ride with someone who has been drinking. ? When you are tired or distracted. ? While texting.  Wear a helmet and other protective equipment during sports activities.  If you have firearms in your house, make sure you follow all gun safety procedures. What's next?  Visit your health care provider once a year for an annual wellness visit.  Ask your health care provider how often you should have your eyes and teeth checked.  Stay up to date on all vaccines. This information is not intended to replace advice given to you by your health care provider. Make sure you discuss any questions you have with your health care provider. Document Revised: 07/31/2020 Document Reviewed: 07/08/2018 Elsevier Patient Education  2021 Elsevier Inc.  

## 2021-01-10 ENCOUNTER — Encounter: Payer: Self-pay | Admitting: Radiology

## 2021-01-10 NOTE — Progress Notes (Signed)
GYNECOLOGY ANNUAL PREVENTATIVE CARE ENCOUNTER NOTE  History:     Deborah Jacobson is a 40 y.o. [phone] female here for a routine annual gynecologic exam and to establish care.  Current complaints: None.   Denies abnormal vaginal bleeding, discharge, pelvic pain, problems with intercourse or other gynecologic concerns.  S/p BTL in 2007. Irregular cycle but has monthly episodes of bleeding. OB hx cesarean birth x 4.   Gynecologic History Patient's last menstrual period was 12/31/2020 (approximate). Contraception: tubal ligation Last Pap: 2019. Results were: normal with negative HPV per patient Last mammogram: No history.   Obstetric History OB History  Gravida Para Term Preterm AB Living  6 4 4   2 4   SAB IAB Ectopic Multiple Live Births  1 1     4     # Outcome Date GA Lbr Len/2nd Weight Sex Delivery Anes PTL Lv  6 Term           5 IAB           4 SAB           3 Term           2 Term           1 Term             Past Medical History:  Diagnosis Date  . Acid reflux   . Diabetes in pregnancy   . Hypertension   . Seasonal allergies     Past Surgical History:  Procedure Laterality Date  . CESAREAN SECTION    . SHOULDER ARTHROSCOPY WITH BICEPS TENDON REPAIR Left 08/29/2020   Procedure: LEFT SHOULDER ARTHROSCOPY WITH BICEPS TENODESIS;  Surgeon: , MD;  Location: [facility];  Service: Orthopedics;  Laterality: Left;  . TUBAL LIGATION      Current Outpatient Medications on File Prior to Visit  Medication Sig Dispense Refill  . albuterol (VENTOLIN HFA) 108 (90 Base) MCG/ACT inhaler INHALE 2 PUFFS INTO THE LUNGS EVERY 6 HOURS AS NEEDED FOR WHEEZING OR SHORTNESS OF BREATH 6.7 g 1  . diclofenac Sodium (VOLTAREN) 1 % GEL Apply 2 g topically 4 (four) times daily. 100 g 2  . diphenhydrAMINE (BENADRYL) 25 MG tablet Take 25 mg by mouth every 6 (six) hours as needed for allergies.    . hydrochlorothiazide (HYDRODIURIL) 12.5 MG tablet TAKE 1 TABLET(12.5  MG) BY MOUTH DAILY 90 tablet 1  . hydrOXYzine (ATARAX/VISTARIL) 25 MG tablet Take 1 tablet (25 mg total) by mouth 3 (three) times daily as needed. 30 tablet 2  . meloxicam (MOBIC) 15 MG tablet Take 0.5-1 tablets (7.5-15 mg total) by mouth daily as needed for pain. 30 tablet 6  . Semaglutide, 1 MG/DOSE, (OZEMPIC, 1 MG/DOSE,) 4 MG/3ML SOPN Inject 1 mg into the skin once a week. 9 mL 1  . traMADol (ULTRAM) 50 MG tablet Take 1 tablet (50 mg total) by mouth 3 (three) times daily as needed. 30 tablet 0  . traZODone (DESYREL) 50 MG tablet Take 1 tablet (50 mg total) by mouth at bedtime as needed for sleep. 30 tablet 1  . [DISCONTINUED] cetirizine (ZYRTEC) 10 MG tablet Take 10 mg by mouth daily.     No current facility-administered medications on file prior to visit.    Allergies  Allergen Reactions  . Latex Hives    Powder from gloves    Social History:  reports that she has quit smoking. She has never used smokeless tobacco. She reports  that she does not drink alcohol and does not use drugs.  Family History  Problem Relation Age of Onset  . Diabetes Mother   . Liver disease Mother   . Hypertension Mother     The following portions of the patient's history were reviewed and updated as appropriate: allergies, current medications, past family history, past medical history, past social history, past surgical history and problem list.  Review of Systems Pertinent items noted in HPI and remainder of comprehensive ROS otherwise negative.  Physical Exam:  BP (!) 136/92   Pulse 86   Ht 4\' 9"  (1.448 m)   Wt 210 lb (95.3 kg)   LMP 12/31/2020 (Approximate)   BMI 45.44 kg/m  CONSTITUTIONAL: Well-developed, well-nourished female in no acute distress.  HENT:  Normocephalic, atraumatic, External right and left ear normal.  EYES: Conjunctivae and EOM are normal. Pupils are equal, round, and reactive to light. No scleral icterus.  NECK: Normal range of motion, supple, no masses.  Normal thyroid.   SKIN: Skin is warm and dry. No rash noted. Not diaphoretic. No erythema. No pallor. MUSCULOSKELETAL: Normal range of motion. No tenderness.  No cyanosis, clubbing, or edema. NEUROLOGIC: Alert and oriented to person, place, and time. Normal reflexes, muscle tone coordination.  PSYCHIATRIC: Normal mood and affect. Normal behavior. Normal judgment and thought content. CARDIOVASCULAR: Normal heart rate noted, regular rhythm RESPIRATORY: Clear to auscultation bilaterally. Effort and breath sounds normal, no problems with respiration noted. BREASTS: Symmetric in size. No masses, tenderness, skin changes, nipple drainage, or lymphadenopathy bilaterally. Performed in the presence of a chaperone. ABDOMEN: Soft, no distention noted.  No tenderness, rebound or guarding.  PELVIC: Normal appearing external genitalia and urethral meatus; normal appearing vaginal mucosa and cervix.  No abnormal discharge noted. Normal uterine size, no other palpable masses, no uterine or adnexal tenderness.  Performed in the presence of a chaperone.   Assessment and Plan:    1. Well woman exam with routine gynecological exam - Pap attempted. Cervix very anterior, located directly behind pubic bone. Multiple patient and CNM positions attempted, able to confirm location of cervix with bimanual but unable to visualize cervix in a position tolerable for patient. 20 minutes of 45 minute visit spent attempting to collect Pap.  Per patient she had pap 3 years ago at [facility]. If records confirm, patient not due for pap. Advised patient that instead of prolonging attempts beyond 45 minute visit, will request records, plan for MD to attempt pap if due - Cytology - PAP - MM 3D SCREEN BREAST BILATERAL; Future  2. Vaginal odor  - Cervicovaginal ancillary only  Will follow up results of pap smear and manage accordingly. Mammogram scheduled Routine preventative health maintenance measures emphasized. Please refer to After Visit Summary for  other counseling recommendations.     Total visit time: 45 minutes. Greater than 50% of visit spent in counseling and coordination of care  [facility], MSN, CNM Certified Nurse Midwife, [place] [employer] for [employer], [facility]

## 2021-01-11 LAB — CERVICOVAGINAL ANCILLARY ONLY
Bacterial Vaginitis (gardnerella): NEGATIVE
Candida Glabrata: NEGATIVE
Candida Vaginitis: NEGATIVE
Comment: NEGATIVE
Comment: NEGATIVE
Comment: NEGATIVE

## 2021-01-13 ENCOUNTER — Encounter: Payer: Self-pay | Admitting: Orthopaedic Surgery

## 2021-01-24 NOTE — Telephone Encounter (Signed)
Looks like [facility] day surgery

## 2021-01-29 ENCOUNTER — Other Ambulatory Visit: Payer: Self-pay | Admitting: Nurse Practitioner

## 2021-01-29 ENCOUNTER — Ambulatory Visit (INDEPENDENT_AMBULATORY_CARE_PROVIDER_SITE_OTHER): Payer: 59 | Admitting: Orthopaedic Surgery

## 2021-01-29 ENCOUNTER — Encounter: Payer: Self-pay | Admitting: Orthopaedic Surgery

## 2021-01-29 DIAGNOSIS — Z9889 Other specified postprocedural states: Secondary | ICD-10-CM

## 2021-01-29 DIAGNOSIS — G4700 Insomnia, unspecified: Secondary | ICD-10-CM

## 2021-01-29 NOTE — Progress Notes (Signed)
Office Visit Note   Patient: Deborah Jacobson           Date of Birth: [birth year]           MRN: [id number] Visit Date: 01/29/2021              Requested by: Deborah Felts, FNP [address] [place],  [place] [address] PCP: Deborah Felts, FNP   Assessment & Plan: Visit Diagnoses:  1. S/P arthroscopy of left shoulder     Plan: Impression is a 53-month status post left biceps tenodesis shoulder arthroscopy.  At this point we will release her to activity as tolerated and continue her daily home exercises.  Based on her symptoms I feel that these will continue to improve with time.  We will see her back as needed.  Follow-Up Instructions: Return if symptoms worsen or fail to improve.   Orders:  No orders of the defined types were placed in this encounter.  No orders of the defined types were placed in this encounter.     Procedures: No procedures performed   Clinical Data: No additional findings.   Subjective: Chief Complaint  Patient presents with  . Left Shoulder - Routine Post Op    Deborah Jacobson is 10-month status post left shoulder scope biceps tenodesis.  Overall doing well and has some calf pain.  She has completed outpatient PT and now doing daily home exercise.  She has been out of work even before the surgery.  She uses Biofreeze and heating pad occasionally.   Review of Systems  Constitutional: Negative.   HENT: Negative.   Eyes: Negative.   Respiratory: Negative.   Cardiovascular: Negative.   Endocrine: Negative.   Musculoskeletal: Negative.   Neurological: Negative.   Hematological: Negative.   Psychiatric/Behavioral: Negative.   All other systems reviewed and are negative.    Objective: Vital Signs: LMP 12/31/2020 (Approximate)   Physical Exam Vitals and nursing note reviewed.  Constitutional:      Appearance: She is well-developed.  Pulmonary:     Effort: Pulmonary effort is normal.  Skin:    General: Skin is warm.     Capillary  Refill: Capillary refill takes less than 2 seconds.  Neurological:     Mental Status: She is alert and oriented to person, place, and time.  Psychiatric:        Behavior: Behavior normal.        Thought Content: Thought content normal.        Judgment: Judgment normal.     Ortho Exam Left shoulder shows fully healed surgical scars.  Range of motion is about 90% of the unaffected side.  Strength is essentially normal to manual muscle testing. Specialty Comments:  No specialty comments available.  Imaging: No results found.   PMFS History: Patient Active Problem List   Diagnosis Date Noted  . Subacromial bursitis of left shoulder joint 08/29/2020  . Tendinopathy of left rotator cuff 08/29/2020  . Morbid obesity with BMI of 45.0-49.9, adult (HCC) 08/09/2020  . Superior glenoid labrum lesion of left shoulder 07/10/2020  . Health maintenance examination 03/23/2019  . Prediabetes 12-25-2018  . Strain of back December 25, 2018  . Death of family member 12-25-2018  . Back pain 07/31/2018  . Fatigue 05/31/2018  . Chronic low back pain 11/30/2017  . Shifting sleep-work schedule 06/15/2017  . 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 Sleep related headaches 06/15/2017  . Super obese 06/15/2017  . Mood complaints in sleep disorder 06/15/2017  . Plantar  fasciitis of left foot 03/01/2014  . Porokeratosis 03/01/2014  . Pain in lower limb 03/01/2014   Past Medical History:  Diagnosis Date  . Acid reflux   . Diabetes in pregnancy   . Hypertension   . Seasonal allergies     Family History  Problem Relation Age of Onset  . Diabetes Mother   . Liver disease Mother   . Hypertension Mother     Past Surgical History:  Procedure Laterality Date  . CESAREAN SECTION    . SHOULDER ARTHROSCOPY WITH BICEPS TENDON REPAIR Left 08/29/2020   Procedure: LEFT SHOULDER ARTHROSCOPY WITH BICEPS TENODESIS;  Surgeon: Tarry Kos, MD;  Location: [facility];  Service: Orthopedics;  Laterality:  Left;  . TUBAL LIGATION     Social History   Occupational History  . Not on file  Tobacco Use  . Smoking status: Former Games developer  . Smokeless tobacco: Never Used  Vaping Use  . Vaping Use: Never used  Substance and Sexual Activity  . Alcohol use: No  . Drug use: No  . Sexual activity: Yes    Birth control/protection: None

## 2021-01-29 NOTE — Telephone Encounter (Signed)
Trazodone refill 

## 2021-02-27 ENCOUNTER — Encounter: Payer: Self-pay | Admitting: Orthopaedic Surgery

## 2021-02-27 ENCOUNTER — Other Ambulatory Visit: Payer: Self-pay | Admitting: Orthopaedic Surgery

## 2021-02-27 MED ORDER — TRAMADOL HCL 50 MG PO TABS: 50.0000 mg | ORAL_TABLET | Freq: Every day | ORAL | 0 refills | Status: DC | PRN

## 2021-03-05 ENCOUNTER — Ambulatory Visit: Payer: 59

## 2021-03-06 ENCOUNTER — Ambulatory Visit: Payer: 59 | Admitting: Nurse Practitioner

## 2021-03-14 ENCOUNTER — Ambulatory Visit: Payer: 59 | Admitting: Obstetrics and Gynecology

## 2021-03-14 ENCOUNTER — Encounter: Payer: Self-pay | Admitting: Obstetrics and Gynecology

## 2021-03-18 NOTE — Progress Notes (Signed)
Patient did not keep her pap smear appointment for 03/14/2021.  Cornelia Copa MD Attending Center for [employer] [facility])

## 2021-03-26 ENCOUNTER — Telehealth: Payer: Self-pay | Admitting: Nurse Practitioner

## 2021-03-26 ENCOUNTER — Ambulatory Visit: Payer: 59 | Admitting: Nurse Practitioner

## 2021-03-26 NOTE — Telephone Encounter (Signed)
Called PT to reschedule no show appt. No answer left VM  

## 2021-04-18 ENCOUNTER — Ambulatory Visit: Payer: 59

## 2021-04-24 ENCOUNTER — Ambulatory Visit (INDEPENDENT_AMBULATORY_CARE_PROVIDER_SITE_OTHER): Payer: 59 | Admitting: Nurse Practitioner

## 2021-04-24 ENCOUNTER — Encounter: Payer: Self-pay | Admitting: Nurse Practitioner

## 2021-04-24 ENCOUNTER — Other Ambulatory Visit: Payer: Self-pay

## 2021-04-24 VITALS — BP 132/74 | HR 74

## 2021-04-24 DIAGNOSIS — R5383 Other fatigue: Secondary | ICD-10-CM | POA: Diagnosis not present

## 2021-04-24 DIAGNOSIS — M545 Low back pain, unspecified: Secondary | ICD-10-CM | POA: Diagnosis not present

## 2021-04-24 DIAGNOSIS — G8929 Other chronic pain: Secondary | ICD-10-CM

## 2021-04-24 DIAGNOSIS — J3489 Other specified disorders of nose and nasal sinuses: Secondary | ICD-10-CM

## 2021-04-24 LAB — POC COVID19 BINAXNOW: SARS Coronavirus 2 Ag: NEGATIVE

## 2021-04-24 MED ORDER — TRAMADOL HCL 50 MG PO TABS: 50.0000 mg | ORAL_TABLET | Freq: Every day | ORAL | 0 refills | Status: DC | PRN

## 2021-04-24 MED ORDER — AMOXICILLIN-POT CLAVULANATE 875-125 MG PO TABS: 1.0000 | ORAL_TABLET | Freq: Two times a day (BID) | ORAL | 0 refills | Status: AC

## 2021-04-24 NOTE — Patient Instructions (Signed)

## 2021-04-24 NOTE — Progress Notes (Signed)
I,Tianna Badgett,acting as a Neurosurgeon for [employer], NP.,have documented all relevant documentation on the behalf of [employer], NP,as directed by  Charlesetta Ivory, NP while in the presence of Charlesetta Ivory, NP.  This visit occurred during the SARS-CoV-2 public health emergency.  Safety protocols were in place, including screening questions prior to the visit, additional usage of staff PPE, and extensive cleaning of exam room while observing appropriate contact time as indicated for disinfecting solutions.  Subjective:     Patient ID: Deborah Jacobson , female    DOB: Jan 28, 1981 , 40 y.o.   MRN: [id number]   Chief Complaint  Patient presents with   Fatigue    HPI  Patient is here for fatigue. She also has complaints of nausea and headache. She said she was exposed to someone who had COVID 19 on Sunday and her symptoms started this week. She is also stating that her back has been hurting. She has a history of back pain and surgery. This was an outside visit. Denies SOB and chest pain     Past Medical History:  Diagnosis Date   Acid reflux    Diabetes in pregnancy    Hypertension    Seasonal allergies      Family History  Problem Relation Age of Onset   Diabetes Mother    Liver disease Mother    Hypertension Mother      Current Outpatient Medications:    amoxicillin-clavulanate (AUGMENTIN) 875-125 MG tablet, Take 1 tablet by mouth 2 (two) times daily for 7 days., Disp: 14 tablet, Rfl: 0   albuterol (VENTOLIN HFA) 108 (90 Base) MCG/ACT inhaler, INHALE 2 PUFFS INTO THE LUNGS EVERY 6 HOURS AS NEEDED FOR WHEEZING OR SHORTNESS OF BREATH, Disp: 6.7 g, Rfl: 1   diclofenac Sodium (VOLTAREN) 1 % GEL, Apply 2 g topically 4 (four) times daily., Disp: 100 g, Rfl: 2   diphenhydrAMINE (BENADRYL) 25 MG tablet, Take 25 mg by mouth every 6 (six) hours as needed for allergies., Disp: , Rfl:    hydrochlorothiazide (HYDRODIURIL) 12.5 MG tablet, TAKE 1 TABLET(12.5 MG) BY MOUTH  DAILY, Disp: 90 tablet, Rfl: 1   hydrOXYzine (ATARAX/VISTARIL) 25 MG tablet, Take 1 tablet (25 mg total) by mouth 3 (three) times daily as needed., Disp: 30 tablet, Rfl: 2   meloxicam (MOBIC) 15 MG tablet, Take 0.5-1 tablets (7.5-15 mg total) by mouth daily as needed for pain., Disp: 30 tablet, Rfl: 6   Semaglutide, 1 MG/DOSE, (OZEMPIC, 1 MG/DOSE,) 4 MG/3ML SOPN, Inject 1 mg into the skin once a week., Disp: 9 mL, Rfl: 1   traMADol (ULTRAM) 50 MG tablet, Take 1 tablet (50 mg total) by mouth daily as needed for up to 5 days., Disp: 5 tablet, Rfl: 0   Allergies  Allergen Reactions   Latex Hives    Powder from gloves     Review of Systems  Constitutional:  Positive for fatigue. Negative for chills and fever.  HENT:  Positive for sinus pressure and sore throat.   Respiratory:  Negative for cough, shortness of breath and wheezing.   Cardiovascular:  Negative for chest pain and palpitations.  Gastrointestinal:  Positive for nausea.  Musculoskeletal:  Positive for arthralgias and back pain.  Neurological:  Positive for headaches.    Today's Vitals   There is no height or weight on file to calculate BMI.   Objective:  Physical Exam Constitutional:      Appearance: Normal appearance.  HENT:     Head: Normocephalic and  atraumatic.     Nose: No congestion.  Cardiovascular:     Rate and Rhythm: Normal rate and regular rhythm.     Pulses: Normal pulses.     Heart sounds: Normal heart sounds. No murmur heard. Pulmonary:     Effort: Pulmonary effort is normal. No respiratory distress.     Breath sounds: Normal breath sounds. No wheezing.  Skin:    General: Skin is warm and dry.     Capillary Refill: Capillary refill takes less than 2 seconds.  Neurological:     Mental Status: She is alert and oriented to person, place, and time.        Assessment And Plan:     1. Fatigue, unspecified type -Will check for covid-19  - POC COVID-19- neg  - Novel Coronavirus, NAA ([facility])  2.  Sinus pressure -will treat her for sinus.  -Advised patient to use OTC Flonase and allergy medication as needed.  - amoxicillin-clavulanate (AUGMENTIN) 875-125 MG tablet; Take 1 tablet by mouth 2 (two) times daily for 7 days.  Dispense: 14 tablet; Refill: 0  3. Chronic low back pain without sciatica, unspecified back pain laterality -Patient has history of chronic back pain -Will do a short course for pain relief -Advised patient to see surgeon  - traMADol (ULTRAM) 50 MG tablet; Take 1 tablet (50 mg total) by mouth daily as needed for up to 5 days.  Dispense: 5 tablet; Refill: 0    The patient was encouraged to call or send a message through MyChart for any questions or concerns.   Side effects and appropriate use of all the medication(s) were discussed with the patient today. Patient advised to use the medication(s) as directed by their healthcare provider. The patient was encouraged to read, review, and understand all associated package inserts and contact our office with any questions or concerns. The patient accepts the risks of the treatment plan and had an opportunity to ask questions.   The patient was encouraged to call or send a message through MyChart for any questions or concerns.   Advised patient to take Vitamin C, D, Zinc.Keep yourself hydrated with a lot of water and rest. Take Delsym for cough and Mucinex. Take Tylenol or pain reliever every 4-6 hours as needed for pain/fever/body ache. Educated patient if symptoms get worse or if she experiences any SOB or pain in her legs to seek immediate emergency care. Continue to monitor your pulse oxygen. Call us if you have any questions. Quarantine for 5 days if tested positive and no symptoms or 10 days if tested positive and have symptoms. Wear a mask around other Jacobson.   Patient was given opportunity to ask questions. Patient verbalized understanding of the plan and was able to repeat key elements of the plan. All questions were  answered to their satisfaction.  Raman Kelsi Benham, DNP   I, Raman Audrionna Lampton have reviewed all documentation for this visit. The documentation on 04/24/21 for the exam, diagnosis, procedures, and orders are all accurate and complete.    IF YOU HAVE BEEN REFERRED TO A SPECIALIST, IT MAY TAKE 1-2 WEEKS TO SCHEDULE/PROCESS THE REFERRAL. IF YOU HAVE NOT HEARD FROM US/SPECIALIST IN TWO WEEKS, PLEASE GIVE [place] A CALL AT [phone].   THE PATIENT IS ENCOURAGED TO PRACTICE SOCIAL DISTANCING DUE TO THE COVID-19 PANDEMIC.

## 2021-04-25 ENCOUNTER — Other Ambulatory Visit: Payer: Self-pay | Admitting: Nurse Practitioner

## 2021-04-25 DIAGNOSIS — G4700 Insomnia, unspecified: Secondary | ICD-10-CM

## 2021-04-25 LAB — NOVEL CORONAVIRUS, NAA: SARS-CoV-2, NAA: NOT DETECTED

## 2021-04-25 LAB — SARS-COV-2, NAA 2 DAY TAT

## 2021-04-26 ENCOUNTER — Other Ambulatory Visit: Payer: Self-pay | Admitting: Nurse Practitioner

## 2021-04-26 DIAGNOSIS — G4700 Insomnia, unspecified: Secondary | ICD-10-CM

## 2021-04-26 MED ORDER — TRAZODONE HCL 50 MG PO TABS: 50.0000 mg | ORAL_TABLET | Freq: Every evening | ORAL | 0 refills | Status: DC | PRN

## 2021-04-26 MED ORDER — TRAZODONE HCL 50 MG PO TABS: 50.0000 mg | ORAL_TABLET | Freq: Every evening | ORAL | 1 refills | Status: DC | PRN

## 2021-04-26 NOTE — Telephone Encounter (Signed)
Refill sent.

## 2021-05-29 ENCOUNTER — Encounter: Payer: Self-pay | Admitting: Orthopaedic Surgery

## 2021-06-05 ENCOUNTER — Other Ambulatory Visit: Payer: Self-pay | Admitting: Orthopaedic Surgery

## 2021-06-05 MED ORDER — TRAMADOL HCL 50 MG PO TABS: 50.0000 mg | ORAL_TABLET | Freq: Every day | ORAL | 0 refills | Status: DC | PRN

## 2021-06-27 ENCOUNTER — Encounter: Payer: Self-pay | Admitting: Orthopaedic Surgery

## 2021-06-28 ENCOUNTER — Other Ambulatory Visit: Payer: Self-pay | Admitting: Nurse Practitioner

## 2021-06-28 DIAGNOSIS — R7303 Prediabetes: Secondary | ICD-10-CM

## 2021-07-01 ENCOUNTER — Encounter: Payer: Self-pay | Admitting: Nurse Practitioner

## 2021-07-01 ENCOUNTER — Emergency Department (HOSPITAL_COMMUNITY)
Admission: EM | Admit: 2021-07-01 | Discharge: 2021-07-02 | Discharge status: Against Medical Advice | Payer: 59 | Attending: Emergency Medicine | Admitting: Emergency Medicine

## 2021-07-01 DIAGNOSIS — Z87891 Personal history of nicotine dependence: Secondary | ICD-10-CM | POA: Insufficient documentation

## 2021-07-01 DIAGNOSIS — R1031 Right lower quadrant pain: Secondary | ICD-10-CM | POA: Insufficient documentation

## 2021-07-01 DIAGNOSIS — I10 Essential (primary) hypertension: Secondary | ICD-10-CM | POA: Insufficient documentation

## 2021-07-01 DIAGNOSIS — R109 Unspecified abdominal pain: Secondary | ICD-10-CM | POA: Diagnosis present

## 2021-07-01 DIAGNOSIS — Z91040 Latex allergy status: Secondary | ICD-10-CM | POA: Insufficient documentation

## 2021-07-01 DIAGNOSIS — Z79899 Other long term (current) drug therapy: Secondary | ICD-10-CM | POA: Insufficient documentation

## 2021-07-02 ENCOUNTER — Encounter: Payer: Self-pay | Admitting: Nurse Practitioner

## 2021-07-02 ENCOUNTER — Encounter (HOSPITAL_COMMUNITY): Payer: Self-pay

## 2021-07-02 ENCOUNTER — Ambulatory Visit
Admission: RE | Admit: 2021-07-02 | Discharge: 2021-07-02 | Disposition: A | Discharge status: Home / Self Care | Payer: 59 | Source: Ambulatory Visit | Attending: Emergency Medicine | Admitting: Emergency Medicine

## 2021-07-02 ENCOUNTER — Encounter (HOSPITAL_COMMUNITY): Payer: Self-pay | Admitting: Radiology

## 2021-07-02 ENCOUNTER — Emergency Department (HOSPITAL_COMMUNITY): Payer: 59

## 2021-07-02 ENCOUNTER — Emergency Department (HOSPITAL_COMMUNITY)
Admission: EM | Admit: 2021-07-02 | Discharge: 2021-07-02 | Disposition: A | Discharge status: Home / Self Care | Payer: 59 | Source: Home / Self Care | Attending: Emergency Medicine | Admitting: Emergency Medicine

## 2021-07-02 ENCOUNTER — Other Ambulatory Visit: Payer: Self-pay

## 2021-07-02 VITALS — BP 107/82 | HR 77 | Temp 98.5°F | Resp 18

## 2021-07-02 DIAGNOSIS — I10 Essential (primary) hypertension: Secondary | ICD-10-CM | POA: Insufficient documentation

## 2021-07-02 DIAGNOSIS — Z91040 Latex allergy status: Secondary | ICD-10-CM | POA: Insufficient documentation

## 2021-07-02 DIAGNOSIS — R1031 Right lower quadrant pain: Secondary | ICD-10-CM | POA: Insufficient documentation

## 2021-07-02 DIAGNOSIS — Z87891 Personal history of nicotine dependence: Secondary | ICD-10-CM | POA: Insufficient documentation

## 2021-07-02 DIAGNOSIS — Z79899 Other long term (current) drug therapy: Secondary | ICD-10-CM | POA: Insufficient documentation

## 2021-07-02 LAB — URINALYSIS, ROUTINE W REFLEX MICROSCOPIC
Bacteria, UA: NONE SEEN
Bilirubin Urine: NEGATIVE
Glucose, UA: NEGATIVE mg/dL
Ketones, ur: NEGATIVE mg/dL
Leukocytes,Ua: NEGATIVE
Nitrite: NEGATIVE
Protein, ur: NEGATIVE mg/dL
RBC / HPF: >50 RBC/hpf — ABNORMAL HIGH (ref 0–5)
Specific Gravity, Urine: 1.02 (ref 1.005–1.030)
pH: 5 (ref 5.0–8.0)

## 2021-07-02 LAB — COMPREHENSIVE METABOLIC PANEL
ALT: 13 U/L (ref 0–44)
AST: 19 U/L (ref 15–41)
Albumin: 3.7 g/dL (ref 3.5–5.0)
Alkaline Phosphatase: 75 U/L (ref 38–126)
Anion gap: 5 (ref 5–15)
BUN: 11 mg/dL (ref 6–20)
CO2: 27 mmol/L (ref 22–32)
Calcium: 9.2 mg/dL (ref 8.9–10.3)
Chloride: 106 mmol/L (ref 98–111)
Creatinine, Ser: 0.73 mg/dL (ref 0.44–1.00)
GFR, Estimated: >60 mL/min (ref >60)
Glucose, Bld: 89 mg/dL (ref 70–99)
Potassium: 3.4 mmol/L — ABNORMAL LOW (ref 3.5–5.1)
Sodium: 138 mmol/L (ref 135–145)
Total Bilirubin: 0.3 mg/dL (ref 0.3–1.2)
Total Protein: 7.7 g/dL (ref 6.5–8.1)

## 2021-07-02 LAB — CBC WITH DIFFERENTIAL/PLATELET
Abs Immature Granulocytes: 0.01 10*3/uL (ref 0.00–0.07)
Basophils Absolute: 0 10*3/uL (ref 0.0–0.1)
Basophils Relative: 1 %
Eosinophils Absolute: 0.1 10*3/uL (ref 0.0–0.5)
Eosinophils Relative: 3 %
HCT: 38 % (ref 36.0–46.0)
Hemoglobin: 12 g/dL (ref 12.0–15.0)
Immature Granulocytes: 0 %
Lymphocytes Relative: 44 %
Lymphs Abs: 2.4 10*3/uL (ref 0.7–4.0)
MCH: 27.3 pg (ref 26.0–34.0)
MCHC: 31.6 g/dL (ref 30.0–36.0)
MCV: 86.6 fL (ref 80.0–100.0)
Monocytes Absolute: 0.4 10*3/uL (ref 0.1–1.0)
Monocytes Relative: 7 %
Neutro Abs: 2.5 10*3/uL (ref 1.7–7.7)
Neutrophils Relative %: 45 %
Platelets: 343 10*3/uL (ref 150–400)
RBC: 4.39 MIL/uL (ref 3.87–5.11)
RDW: 15.6 % — ABNORMAL HIGH (ref 11.5–15.5)
WBC: 5.5 10*3/uL (ref 4.0–10.5)
nRBC: 0 % (ref 0.0–0.2)

## 2021-07-02 LAB — POCT URINALYSIS DIP (MANUAL ENTRY)
Bilirubin, UA: NEGATIVE
Glucose, UA: NEGATIVE mg/dL
Ketones, POC UA: NEGATIVE mg/dL
Leukocytes, UA: NEGATIVE
Nitrite, UA: NEGATIVE
Protein Ur, POC: 100 mg/dL — AB
Spec Grav, UA: >=1.03 — AB (ref 1.010–1.025)
Urobilinogen, UA: 1 E.U./dL
pH, UA: 6 (ref 5.0–8.0)

## 2021-07-02 LAB — I-STAT BETA HCG BLOOD, ED (MC, WL, AP ONLY): I-stat hCG, quantitative: <5 m[IU]/mL (ref <5)

## 2021-07-02 LAB — LIPASE, BLOOD: Lipase: 27 U/L (ref 11–51)

## 2021-07-02 MED ORDER — IOHEXOL 300 MG/ML  SOLN
100.0000 mL | Freq: Once | INTRAMUSCULAR | Status: AC | PRN
  Administered 2021-07-02: 100 mL via INTRAVENOUS

## 2021-07-02 NOTE — ED Triage Notes (Signed)
Pt c/o feeling nodule to RLQ with pain, pt also has Rt flank pain. Patient is currently on menstrual and states she still has her appendix. Denies chills/ fever, and changes in urinary pattern.

## 2021-07-02 NOTE — ED Notes (Signed)
Pt taken out via wheelchair at d/c. VSS. GCS 15. Steady on feet to car. Husband picked her up.

## 2021-07-02 NOTE — ED Provider Notes (Signed)
[facility] EMERGENCY DEPARTMENT Provider Note   CSN: [id number] Arrival date & time: 07/02/21  1315     History No chief complaint on file.   ABCDE Deborah Jacobson is a 40 y.o. female.  Patient with onset of right lower quadrant abdominal pain that started yesterday.  Intermittent in nature.  Patient seen in urgent care told to come in for concerns for appendicitis.  Patient is currently on her menstrual period patient went to [facility] this morning but the wait was too long.  So she came here.  No nausea no vomiting no diarrhea.      Past Medical History:  Diagnosis Date   Acid reflux    Diabetes in pregnancy    Hypertension    Seasonal allergies     Patient Active Problem List   Diagnosis Date Noted   Subacromial bursitis of left shoulder joint 08/29/2020   Tendinopathy of left rotator cuff 08/29/2020   Morbid obesity with BMI of 45.0-49.9, adult (HCC) 08/09/2020   Superior glenoid labrum lesion of left shoulder 07/10/2020   Health maintenance examination 03/23/2019   Prediabetes 12-31-2018   Strain of back 12/31/2018   Death of family member 2018/12/31   Back pain 07/31/2018   Fatigue 05/31/2018   Chronic low back pain 11/30/2017   Shifting sleep-work schedule 06/15/2017   Paradoxical insomnia 06/15/2017   Sleep related headaches 06/15/2017   Super obese 06/15/2017   Mood complaints in sleep disorder 06/15/2017   Plantar fasciitis of left foot 03/01/2014   Porokeratosis 03/01/2014   Pain in lower limb 03/01/2014    Past Surgical History:  Procedure Laterality Date   CESAREAN SECTION     SHOULDER ARTHROSCOPY WITH BICEPS TENDON REPAIR Left 08/29/2020   Procedure: LEFT SHOULDER ARTHROSCOPY WITH BICEPS TENODESIS;  Surgeon: Tarry Kos, MD;  Location: [facility];  Service: Orthopedics;  Laterality: Left;   TUBAL LIGATION       OB History     Gravida  6   Para  4   Term  4   Preterm      AB  2   Living  4      SAB   1   IAB  1   Ectopic      Multiple      Live Births  4           Family History  Problem Relation Age of Onset   Diabetes Mother    Liver disease Mother    Hypertension Mother     Social History   Tobacco Use   Smoking status: Former   Smokeless tobacco: Never  Building services engineer Use: Never used  Substance Use Topics   Alcohol use: No   Drug use: No    Home Medications Prior to Admission medications   Medication Sig Start Date End Date Taking? Authorizing Provider  traMADol (ULTRAM) 50 MG tablet Take 1-2 tablets (50-100 mg total) by mouth daily as needed. 06/05/21   Tarry Kos, MD  albuterol (VENTOLIN HFA) 108 (90 Base) MCG/ACT inhaler INHALE 2 PUFFS INTO THE LUNGS EVERY 6 HOURS AS NEEDED FOR WHEEZING OR SHORTNESS OF BREATH 08/29/20   Arnette Felts, FNP  diclofenac Sodium (VOLTAREN) 1 % GEL Apply 2 g topically 4 (four) times daily. 11/05/20   Arnette Felts, FNP  diphenhydrAMINE (BENADRYL) 25 MG tablet Take 25 mg by mouth every 6 (six) hours as needed for allergies.    [provider]  hydrochlorothiazide (HYDRODIURIL) 12.5 MG tablet TAKE 1 TABLET(12.5 MG) BY MOUTH DAILY 06/28/21   Arnette Felts, FNP  hydrOXYzine (ATARAX/VISTARIL) 25 MG tablet Take 1 tablet (25 mg total) by mouth 3 (three) times daily as needed. 08/09/20   Arnette Felts, FNP  meloxicam (MOBIC) 15 MG tablet Take 0.5-1 tablets (7.5-15 mg total) by mouth daily as needed for pain. 05/22/20   Hilts, Casimiro Needle, MD  Semaglutide, 1 MG/DOSE, (OZEMPIC, 1 MG/DOSE,) 4 MG/3ML SOPN Inject 1 mg into the skin once a week. 11/05/20   Arnette Felts, FNP  traZODone (DESYREL) 50 MG tablet Take 1 tablet (50 mg total) by mouth at bedtime as needed for sleep. 04/26/21   Charlesetta Ivory, NP  cetirizine (ZYRTEC) 10 MG tablet Take 10 mg by mouth daily.  10/13/19  [provider]    Allergies    Latex  Review of Systems   Review of Systems  Constitutional:  Negative for chills and fever.  HENT:  Negative  for rhinorrhea and sore throat.   Eyes:  Negative for visual disturbance.  Respiratory:  Negative for cough and shortness of breath.   Cardiovascular:  Negative for chest pain and leg swelling.  Gastrointestinal:  Positive for abdominal pain. Negative for diarrhea, nausea and vomiting.  Genitourinary:  Positive for flank pain and vaginal bleeding. Negative for dysuria.  Musculoskeletal:  Negative for back pain and neck pain.  Skin:  Negative for rash.  Neurological:  Negative for dizziness, light-headedness and headaches.  Hematological:  Does not bruise/bleed easily.  Psychiatric/Behavioral:  Negative for confusion.    Physical Exam Updated Vital Signs BP (!) 133/99 (BP Location: Left Arm)   Pulse 74   Temp 98.7 F (37.1 C) (Oral)   Resp 15   LMP 07/01/2021   SpO2 100%   Physical Exam  ED Results / Procedures / Treatments   Labs (all labs ordered are listed, but only abnormal results are displayed) Labs Reviewed  COMPREHENSIVE METABOLIC PANEL - Abnormal; Notable for the following components:      Result Value   Potassium 3.4 (*)    All other components within normal limits  CBC WITH DIFFERENTIAL/PLATELET - Abnormal; Notable for the following components:   RDW 15.6 (*)    All other components within normal limits  URINALYSIS, ROUTINE W REFLEX MICROSCOPIC - Abnormal; Notable for the following components:   APPearance HAZY (*)    Hgb urine dipstick LARGE (*)    RBC / HPF >50 (*)    All other components within normal limits  LIPASE, BLOOD  I-STAT BETA HCG BLOOD, ED ([facility], [facility], [facility] ONLY)    EKG None  Radiology CT ABDOMEN PELVIS W CONTRAST  Result Date: 07/02/2021 CLINICAL DATA:  RIGHT lower quadrant pain, concern for appendicitis in this 40 year old female. EXAM: CT ABDOMEN AND PELVIS WITH CONTRAST TECHNIQUE: Multidetector CT imaging of the abdomen and pelvis was performed using the standard protocol following bolus administration of intravenous contrast. CONTRAST:   OMNIPAQUE IOHEXOL 300 MG/ML  SOLN COMPARISON:  November 16, 2018, endovaginal sonogram from December 12, 2020. FINDINGS: Lower chest: Lung bases are clear. No effusion or consolidative changes. Hepatobiliary: Cyst in the posterior RIGHT hepatic lobe. Small cysts elsewhere in the liver. Largest in the posterior RIGHT hepatic lobe measuring 2.1 cm, well-marginated, increased in size compared to the study of April of 2019 in January of 2020. Other cysts are minimally increased in size. No pericholecystic stranding. No biliary duct dilation. Portal vein is patent. Hepatic veins are patent. Pancreas: Normal,  without mass, inflammation or ductal dilatation. Spleen: Normal size and contour.  No focal lesion. Adrenals/Urinary Tract: Adrenal glands are normal. There is symmetric renal enhancement. No hydronephrosis. No perinephric stranding. Smooth contour of the urinary bladder. No perivesical stranding. No visible ureteral calculi. Added density in the renal collecting systems favored to represent a small amount of excreted contrast. Stomach/Bowel: No acute gastrointestinal process with normal appendix. Vascular/Lymphatic: Patent portal vein. Patent abdominal aorta with normal caliber and without signs of atherosclerotic changes. Smooth contour of the IVC. There is no gastrohepatic or hepatoduodenal ligament lymphadenopathy. No retroperitoneal or mesenteric lymphadenopathy. No pelvic sidewall lymphadenopathy. Reproductive: Signs of prior C-section suggested in the lower uterus, perhaps classic type incision, longitudinal type C-section. No adnexal masses. Other: No free air. No signs of ascites. Small fat containing umbilical hernia and mild rectus diastasis is similar to prior imaging. Musculoskeletal: No acute bone finding or destructive bone process. IMPRESSION: 1. No acute findings in the abdomen or pelvis. 2. Hepatic cysts. 3. Small fat containing umbilical hernia and mild rectus diastasis is similar to prior imaging.  Electronically Signed   By: Donzetta Kohut M.D.   On: 07/02/2021 18:22    Procedures Procedures   Medications Ordered in ED Medications  iohexol (OMNIPAQUE) 300 MG/ML solution 100 mL (100 mLs Intravenous Contrast Given 07/02/21 1757)    ED Course  I have reviewed the triage vital signs and the nursing notes.  Pertinent labs & imaging results that were available during my care of the patient were reviewed by me and considered in my medical decision making (see chart for details).    MDM Rules/Calculators/A&P                           Work-up for the right lower quadrant abdominal pain urinalysis not consistent with urinary tract infection.  Pregnancy test negative.  There was some blood in the urine but she is on her menses.  No other significant lab abnormalities.  CT scan of the abdomen and pelvis without any acute findings to explain the pain.  May be menstrual related.   Final Clinical Impression(s) / ED Diagnoses Final diagnoses:  Right lower quadrant abdominal pain    Rx / DC Orders ED Discharge Orders     None        Vanetta Mulders, MD 07/02/21 2100

## 2021-07-02 NOTE — ED Notes (Addendum)
Patient is being discharged from the Urgent Care and sent to the Emergency Department via personal vehicle. Per H. Wieters PA-C, patient is in need of higher level of care due to RLQ pain, and unabitly to r/o appendicitis. Patient is aware and verbalizes understanding of plan of care.  Vitals:   07/02/21 1220  BP: 107/82  Pulse: 77  Resp: 18  Temp: 98.5 F (36.9 C)  SpO2: 97%

## 2021-07-02 NOTE — Discharge Instructions (Addendum)
Acute onset RLQ pain last night, concern for appendicitis vs stone

## 2021-07-02 NOTE — ED Provider Notes (Signed)
Emergency Medicine Provider Triage Evaluation Note  Deborah Jacobson , a 40 y.o. female  was evaluated in triage.  Pt complains of right lower quadrant abdominal pain.  This started yesterday.  She went to urgent care this morning and was sent here.  She was seen at [facility] however did not have any work-up done on left before being seen. She denies any fevers.  No nausea or vomiting.  She has had 4 C-sections. Her pain radiates into her back.  No history of kidney stones.  Review of Systems  Positive: RLQ abdominal pain Negative: fevers  Physical Exam  BP 112/72 (BP Location: Left Arm)   Pulse 72   Temp 98.7 F (37.1 C) (Oral)   Resp 18   LMP 07/01/2021   SpO2 98%  Gen:   Awake, no distress   Resp:  Normal effort  MSK:   Moves extremities without difficulty  Other:  Abdomen is diffusely tender primarily in the right lower quadrant.  Medical Decision Making  Medically screening exam initiated at 2:07 PM.  Appropriate orders placed.  Deborah Jacobson was informed that the remainder of the evaluation will be completed by another provider, this initial triage assessment does not replace that evaluation, and the importance of remaining in the ED until their evaluation is complete.  Note: Portions of this report may have been transcribed using voice recognition software. Every effort was made to ensure accuracy; however, inadvertent computerized transcription errors may be present    Cristina Gong, PA-C 07/02/21 1422    Rolan Bucco, MD 07/02/21 1450

## 2021-07-02 NOTE — ED Triage Notes (Signed)
Pt complains of right flank pain for the past few hrs.

## 2021-07-02 NOTE — ED Provider Notes (Signed)
[facility]    CSN: [id number] Arrival date & time: 07/02/21  1158      History   Chief Complaint Chief Complaint  Patient presents with   Abdominal Pain   Back Pain    HPI Deborah Jacobson is a 40 y.o. female presenting today for evaluation of abdominal pain. Acute onset of abdominal pain last night. Located in RLQ and right flank. Pain waxing and waning in severity. Denies urinary symptoms, hematuria, dysuria. Denies history of stones. Bowels at baseline. Denies nausea, vomiting. Denies fevers.  Denies prior abdominal surgeries.  Currently on menstrual cycle.  Pain worse with movement.  HPI  Past Medical History:  Diagnosis Date   Acid reflux    Diabetes in pregnancy    Hypertension    Seasonal allergies     Patient Active Problem List   Diagnosis Date Noted   Subacromial bursitis of left shoulder joint 08/29/2020   Tendinopathy of left rotator cuff 08/29/2020   Morbid obesity with BMI of 45.0-49.9, adult (HCC) 08/09/2020   Superior glenoid labrum lesion of left shoulder 07/10/2020   Health maintenance examination 03/23/2019   Prediabetes 2018-12-20   Strain of back 12-20-2018   Death of family member 12-20-2018   Back pain 07/31/2018   Fatigue 05/31/2018   Chronic low back pain 11/30/2017   Shifting sleep-work schedule 06/15/2017   Paradoxical insomnia 06/15/2017   Sleep related headaches 06/15/2017   Super obese 06/15/2017   Mood complaints in sleep disorder 06/15/2017   Plantar fasciitis of left foot 03/01/2014   Porokeratosis 03/01/2014   Pain in lower limb 03/01/2014    Past Surgical History:  Procedure Laterality Date   CESAREAN SECTION     SHOULDER ARTHROSCOPY WITH BICEPS TENDON REPAIR Left 08/29/2020   Procedure: LEFT SHOULDER ARTHROSCOPY WITH BICEPS TENODESIS;  Surgeon: Tarry Kos, MD;  Location: [facility];  Service: Orthopedics;  Laterality: Left;   TUBAL LIGATION      OB History     Gravida  6   Para  4    Term  4   Preterm      AB  2   Living  4      SAB  1   IAB  1   Ectopic      Multiple      Live Births  4            Home Medications    Prior to Admission medications   Medication Sig Start Date End Date Taking? Authorizing Provider  traMADol (ULTRAM) 50 MG tablet Take 1-2 tablets (50-100 mg total) by mouth daily as needed. 06/05/21   Tarry Kos, MD  albuterol (VENTOLIN HFA) 108 (90 Base) MCG/ACT inhaler INHALE 2 PUFFS INTO THE LUNGS EVERY 6 HOURS AS NEEDED FOR WHEEZING OR SHORTNESS OF BREATH 08/29/20   Arnette Felts, FNP  diclofenac Sodium (VOLTAREN) 1 % GEL Apply 2 g topically 4 (four) times daily. 11/05/20   Arnette Felts, FNP  diphenhydrAMINE (BENADRYL) 25 MG tablet Take 25 mg by mouth every 6 (six) hours as needed for allergies.    [provider]  hydrochlorothiazide (HYDRODIURIL) 12.5 MG tablet TAKE 1 TABLET(12.5 MG) BY MOUTH DAILY 06/28/21   Arnette Felts, FNP  hydrOXYzine (ATARAX/VISTARIL) 25 MG tablet Take 1 tablet (25 mg total) by mouth 3 (three) times daily as needed. 08/09/20   Arnette Felts, FNP  meloxicam (MOBIC) 15 MG tablet Take 0.5-1 tablets (7.5-15 mg total) by mouth daily as needed  for pain. 05/22/20   Hilts, Casimiro Needle, MD  Semaglutide, 1 MG/DOSE, (OZEMPIC, 1 MG/DOSE,) 4 MG/3ML SOPN Inject 1 mg into the skin once a week. 11/05/20   Arnette Felts, FNP  traZODone (DESYREL) 50 MG tablet Take 1 tablet (50 mg total) by mouth at bedtime as needed for sleep. 04/26/21   Charlesetta Ivory, NP  cetirizine (ZYRTEC) 10 MG tablet Take 10 mg by mouth daily.  10/13/19  [provider]    Family History Family History  Problem Relation Age of Onset   Diabetes Mother    Liver disease Mother    Hypertension Mother     Social History Social History   Tobacco Use   Smoking status: Former   Smokeless tobacco: Never  Building services engineer Use: Never used  Substance Use Topics   Alcohol use: No   Drug use: No     Allergies   Latex   Review  of Systems Review of Systems  Constitutional:  Negative for fever.  Respiratory:  Negative for shortness of breath.   Cardiovascular:  Negative for chest pain.  Gastrointestinal:  Positive for abdominal pain. Negative for diarrhea, nausea and vomiting.  Genitourinary:  Negative for dysuria, flank pain, genital sores, hematuria, menstrual problem, vaginal bleeding, vaginal discharge and vaginal pain.  Musculoskeletal:  Negative for back pain.  Skin:  Negative for rash.  Neurological:  Negative for dizziness, light-headedness and headaches.    Physical Exam Triage Vital Signs ED Triage Vitals  Enc Vitals Group     BP      Pulse      Resp      Temp      Temp src      SpO2      Weight      Height      Head Circumference      Peak Flow      Pain Score      Pain Loc      Pain Edu?      Excl. in GC?    No data found.  Updated Vital Signs BP 107/82 (BP Location: Left Arm)   Pulse 77   Temp 98.5 F (36.9 C) (Oral)   Resp 18   LMP 07/01/2021   SpO2 97%   Visual Acuity Right Eye Distance:   Left Eye Distance:   Bilateral Distance:    Right Eye Near:   Left Eye Near:    Bilateral Near:     Physical Exam Vitals and nursing note reviewed.  Constitutional:      Appearance: She is well-developed.     Comments: No acute distress  HENT:     Head: Normocephalic and atraumatic.     Nose: Nose normal.  Eyes:     Conjunctiva/sclera: Conjunctivae normal.  Cardiovascular:     Rate and Rhythm: Normal rate and regular rhythm.  Pulmonary:     Effort: Pulmonary effort is normal. No respiratory distress.  Abdominal:     General: There is no distension.     Comments: Holding right side when transitioning from sitting to supine, avoiding pressure on right side  Tender to palpation in right lower quadrant, positive Rovsing, positive McBurney's  Musculoskeletal:        General: Normal range of motion.     Cervical back: Neck supple.  Skin:    General: Skin is warm and dry.   Neurological:     Mental Status: She is alert and oriented to person, place, and time.  UC Treatments / Results  Labs (all labs ordered are listed, but only abnormal results are displayed) Labs Reviewed  POCT URINALYSIS DIP (MANUAL ENTRY) - Abnormal; Notable for the following components:      Result Value   Color, UA other (*)    Clarity, UA cloudy (*)    Spec Grav, UA >=1.030 (*)    Blood, UA large (*)    Protein Ur, POC =100 (*)    All other components within normal limits    EKG   Radiology No results found.  Procedures Procedures (including critical care time)  Medications Ordered in UC Medications - No data to display  Initial Impression / Assessment and Plan / UC Course  I have reviewed the triage vital signs and the nursing notes.  Pertinent labs & imaging results that were available during my care of the patient were reviewed by me and considered in my medical decision making (see chart for details).     Urine with negative leuks and nitrites, does have large blood, but likely from being on menstrual cycle, cannot rule out stone, but most suspicious of possible appendicitis given abdominal exam findings.  Recommending further evaluation and work-up in emergency room.  Patient verbalized understanding expresses intent to go to ED for further imaging.  Discussed strict return precautions. Patient verbalized understanding and is agreeable with plan.  Final Clinical Impressions(s) / UC Diagnoses   Final diagnoses:  RLQ abdominal pain     Discharge Instructions      Acute onset RLQ pain last night, concern for appendicitis vs stone     ED Prescriptions   None    PDMP not reviewed this encounter.   Lew Dawes, [place] 07/02/21 1343

## 2021-07-02 NOTE — Discharge Instructions (Addendum)
I would recommend Motrin for the abdominal pain.  Follow-up with your doctor if symptoms do not resolve over the next few days.  CT scan of the abdomen without any evidence of appendicitis or any kidney stones.  No explanation for the pain.  Which is reassuring.

## 2021-07-02 NOTE — ED Triage Notes (Signed)
Pt here sent from UC for right lower quadrant pain , was [place] [facility] last [place] but did not stay

## 2021-07-02 NOTE — ED Notes (Signed)
Pt here with RLQ pain that started yesterday. Said it's intermittent and comes and goes in waves. Sometimes the pain is sharp and sometimes it's dull. She said she went to [place] yesterday, but the wait was too long. She feels like it's possibly a kidney stone because it goes from her front to her back, but she's never had one before. No nausea with it. Says the last 2 hours her pain has felt dull. Urgent care told her to come here because they were concerned for appendicitis. Pt AxO x4. GCS 15. Denies further needs.

## 2021-07-03 NOTE — Telephone Encounter (Signed)
Left voice mail

## 2021-07-04 ENCOUNTER — Other Ambulatory Visit: Payer: Self-pay

## 2021-07-04 ENCOUNTER — Ambulatory Visit (INDEPENDENT_AMBULATORY_CARE_PROVIDER_SITE_OTHER): Payer: 59 | Admitting: Nurse Practitioner

## 2021-07-04 ENCOUNTER — Encounter: Payer: Self-pay | Admitting: Nurse Practitioner

## 2021-07-04 VITALS — BP 114/66 | HR 78 | Temp 97.9°F | Ht <= 58 in | Wt 196.4 lb

## 2021-07-04 DIAGNOSIS — R1030 Lower abdominal pain, unspecified: Secondary | ICD-10-CM

## 2021-07-04 DIAGNOSIS — Z01419 Encounter for gynecological examination (general) (routine) without abnormal findings: Secondary | ICD-10-CM

## 2021-07-04 DIAGNOSIS — K7689 Other specified diseases of liver: Secondary | ICD-10-CM

## 2021-07-04 DIAGNOSIS — M6208 Separation of muscle (nontraumatic), other site: Secondary | ICD-10-CM

## 2021-07-04 NOTE — Progress Notes (Signed)
I,Katawbba Wiggins,acting as a Neurosurgeon for [employer], FNP.,have documented all relevant documentation on the behalf of Arnette Felts, FNP,as directed by  Arnette Felts, FNP while in the presence of Arnette Felts, FNP.   This visit occurred during the SARS-CoV-2 public health emergency.  Safety protocols were in place, including screening questions prior to the visit, additional usage of staff PPE, and extensive cleaning of exam room while observing appropriate contact time as indicated for disinfecting solutions.  Subjective:     Patient ID: Deborah Jacobson , female    DOB: 1981-06-09 , 40 y.o.   MRN: [id number]   Chief Complaint  Patient presents with   Abdominal Pain    HPI  The patient is here for abdominal pain.  She is having abdomen pain from lower abdomen around her low back flank area. Monday started having pain. She felt like she had a knot to the groin area applied heating pad but when radiating to flank area. Dull to sharp. Pain was not as strong, pain is taking her breath around.  Her cycle came on yesterday. She sees the GYN in [place]. She has an appt with Dr. Shawnie Pons in October.      Past Medical History:  Diagnosis Date   Acid reflux    Diabetes in pregnancy    Hypertension    Seasonal allergies      Family History  Problem Relation Age of Onset   Diabetes Mother    Liver disease Mother    Hypertension Mother      Current Outpatient Medications:    traMADol (ULTRAM) 50 MG tablet, Take 1-2 tablets (50-100 mg total) by mouth daily as needed., Disp: 20 tablet, Rfl: 0   albuterol (VENTOLIN HFA) 108 (90 Base) MCG/ACT inhaler, INHALE 2 PUFFS INTO THE LUNGS EVERY 6 HOURS AS NEEDED FOR WHEEZING OR SHORTNESS OF BREATH, Disp: 6.7 g, Rfl: 1   diclofenac Sodium (VOLTAREN) 1 % GEL, Apply 2 g topically 4 (four) times daily., Disp: 100 g, Rfl: 2   diphenhydrAMINE (BENADRYL) 25 MG tablet, Take 25 mg by mouth every 6 (six) hours as needed for allergies., Disp: , Rfl:     hydrochlorothiazide (HYDRODIURIL) 12.5 MG tablet, TAKE 1 TABLET(12.5 MG) BY MOUTH DAILY, Disp: 90 tablet, Rfl: 1   hydrOXYzine (ATARAX/VISTARIL) 25 MG tablet, Take 1 tablet (25 mg total) by mouth 3 (three) times daily as needed., Disp: 30 tablet, Rfl: 2   meloxicam (MOBIC) 15 MG tablet, Take 0.5-1 tablets (7.5-15 mg total) by mouth daily as needed for pain., Disp: 30 tablet, Rfl: 6   OZEMPIC, 1 MG/DOSE, 4 MG/3ML SOPN, INJECT 1MG  UNDER THE SKIN ONCE A WEEK, Disp: 9 mL, Rfl: 1   traZODone (DESYREL) 50 MG tablet, Take 1 tablet (50 mg total) by mouth at bedtime as needed for sleep., Disp: 30 tablet, Rfl: 0   Allergies  Allergen Reactions   Latex Hives    Powder from gloves     Review of Systems  Constitutional: Negative.  Negative for fatigue.  Respiratory: Negative.  Negative for cough.   Cardiovascular: Negative.   Gastrointestinal:  Positive for abdominal pain (right lower quadrant radiating to low back). Negative for abdominal distention and nausea.  Musculoskeletal:  Positive for back pain.  Neurological:  Negative for dizziness and headaches.  Psychiatric/Behavioral: Negative.      Today's Vitals   07/04/21 0936  BP: 114/66  Pulse: 78  Temp: 97.9 F (36.6 C)  TempSrc: Oral  Weight: 196 lb 6.4 oz (89.1  kg)  Height: 4\' 9"  (1.448 m)   Body mass index is 42.5 kg/m.  Wt Readings from Last 3 Encounters:  07/04/21 196 lb 6.4 oz (89.1 kg)  07/01/21 196 lb (88.9 kg)  01/09/21 210 lb (95.3 kg)    BP Readings from Last 3 Encounters:  07/04/21 114/66  07/02/21 (!) 130/95  07/02/21 107/82    Objective:  Physical Exam Vitals reviewed.  Constitutional:      General: She is not in acute distress.    Appearance: Normal appearance. She is well-developed. She is obese.  HENT:     Head: Normocephalic and atraumatic.     Right Ear: Hearing normal.     Left Ear: Hearing normal.     Nose:     Comments: Deferred - masked    Mouth/Throat:     Comments: Deferred - masked Eyes:      General: Lids are normal.     Funduscopic exam:    Right eye: No papilledema.        Left eye: No papilledema.  Neck:     Thyroid: No thyroid mass.     Vascular: No carotid bruit.  Cardiovascular:     Rate and Rhythm: Normal rate and regular rhythm.     Pulses: Normal pulses.     Heart sounds: Normal heart sounds. No murmur heard. Pulmonary:     Effort: Pulmonary effort is normal. No respiratory distress.     Breath sounds: Normal breath sounds. No wheezing.  Chest:     Chest wall: No mass.  Breasts:    Tanner Score is 5.     Right: Normal. No mass or tenderness.     Left: Normal. No mass or tenderness.  Abdominal:     General: Abdomen is flat. Bowel sounds are normal. There is no distension.     Palpations: Abdomen is soft.     Tenderness: There is abdominal tenderness.  Genitourinary:    Rectum: Guaiac result negative.  Musculoskeletal:     Cervical back: Full passive range of motion without pain, normal range of motion and neck supple.  Lymphadenopathy:     Upper Body:     Right upper body: No supraclavicular, axillary or pectoral adenopathy.     Left upper body: No supraclavicular, axillary or pectoral adenopathy.  Skin:    General: Skin is warm and dry.     Capillary Refill: Capillary refill takes less than 2 seconds.  Neurological:     General: No focal deficit present.     Mental Status: She is alert and oriented to person, place, and time.     Cranial Nerves: No cranial nerve deficit.     Sensory: No sensory deficit.  Psychiatric:        Mood and Affect: Mood normal.        Behavior: Behavior normal.        Thought Content: Thought content normal.        Judgment: Judgment normal.        Assessment And Plan:     1. Lower abdominal pain Comments: she had a finding of rectus diastasis with her CT scan, this may be causing her abdomen pain - Ambulatory referral to Plastic Surgery - Ambulatory referral to Obstetrics / Gynecology  2. Hepatic cyst Comments:  Incidental finding with CT scan of abdomen  3. Rectus diastasis Comments: Tenderness in the area of her abdomen - Ambulatory referral to Plastic Surgery  4. Encounter for gynecological examination - Ambulatory referral  to Obstetrics / Gynecology    Patient was given opportunity to ask questions. Patient verbalized understanding of the plan and was able to repeat key elements of the plan. All questions were answered to their satisfaction.  Arnette Felts, FNP   I, Arnette Felts, FNP, have reviewed all documentation for this visit. The documentation on 07/16/21 for the exam, diagnosis, procedures, and orders are all accurate and complete.   IF YOU HAVE BEEN REFERRED TO A SPECIALIST, IT MAY TAKE 1-2 WEEKS TO SCHEDULE/PROCESS THE REFERRAL. IF YOU HAVE NOT HEARD FROM US/SPECIALIST IN TWO WEEKS, PLEASE GIVE [place] A CALL AT [phone].   THE PATIENT IS ENCOURAGED TO PRACTICE SOCIAL DISTANCING DUE TO THE COVID-19 PANDEMIC.

## 2021-07-04 NOTE — Patient Instructions (Signed)

## 2021-07-10 ENCOUNTER — Telehealth: Payer: Self-pay | Admitting: Obstetrics and Gynecology

## 2021-07-11 ENCOUNTER — Other Ambulatory Visit: Payer: Self-pay | Admitting: Nurse Practitioner

## 2021-07-11 DIAGNOSIS — R7303 Prediabetes: Secondary | ICD-10-CM

## 2021-07-16 ENCOUNTER — Encounter: Payer: Self-pay | Admitting: Nurse Practitioner

## 2021-07-23 ENCOUNTER — Encounter: Payer: Self-pay | Admitting: Orthopaedic Surgery

## 2021-07-25 NOTE — Telephone Encounter (Signed)
Please send referral for physical therapy for her back thank you

## 2021-07-26 ENCOUNTER — Other Ambulatory Visit: Payer: Self-pay

## 2021-07-26 ENCOUNTER — Encounter: Payer: Self-pay | Admitting: Orthopaedic Surgery

## 2021-07-26 DIAGNOSIS — G8929 Other chronic pain: Secondary | ICD-10-CM

## 2021-08-01 ENCOUNTER — Encounter: Payer: Self-pay | Admitting: Nurse Practitioner

## 2021-08-05 ENCOUNTER — Other Ambulatory Visit: Payer: Self-pay

## 2021-08-05 ENCOUNTER — Ambulatory Visit (INDEPENDENT_AMBULATORY_CARE_PROVIDER_SITE_OTHER): Payer: 59 | Admitting: Physical Therapy

## 2021-08-05 ENCOUNTER — Other Ambulatory Visit: Payer: Self-pay | Admitting: Nurse Practitioner

## 2021-08-05 ENCOUNTER — Encounter: Payer: Self-pay | Admitting: Physical Therapy

## 2021-08-05 DIAGNOSIS — M545 Low back pain, unspecified: Secondary | ICD-10-CM | POA: Diagnosis not present

## 2021-08-05 DIAGNOSIS — R293 Abnormal posture: Secondary | ICD-10-CM | POA: Diagnosis not present

## 2021-08-05 DIAGNOSIS — M546 Pain in thoracic spine: Secondary | ICD-10-CM | POA: Diagnosis not present

## 2021-08-05 DIAGNOSIS — G8929 Other chronic pain: Secondary | ICD-10-CM

## 2021-08-05 DIAGNOSIS — M6281 Muscle weakness (generalized): Secondary | ICD-10-CM | POA: Diagnosis not present

## 2021-08-05 MED ORDER — MELOXICAM 15 MG PO TABS: 7.5000 mg | ORAL_TABLET | Freq: Every day | ORAL | 6 refills | Status: DC | PRN

## 2021-08-05 NOTE — Patient Instructions (Signed)
Access Code: [email or web address] URL: [email or web address] Date: 08/05/2021 Prepared by: Ivery Quale  Exercises Cat Cow - 2 x daily - 6 x weekly - 1 sets - 10 reps - 5 hold Seated Thoracic Lumbar Extension with Pectoralis Stretch - 2 x daily - 6 x weekly - 1 sets - 10 reps - 5 sec hold Prone Single Arm Shoulder Y - 2 x daily - 6 x weekly - 1-2 sets - 10 reps Prone Shoulder Horizontal Abduction - 2 x daily - 3-4 x weekly - 1-2 sets - 10 reps - 3 sec hold Shoulder External Rotation and Scapular Retraction with Resistance - 2 x daily - 6 x weekly - 2 sets - 10 reps Standing Shoulder Row with Anchored Resistance - 2 x daily - 6 x weekly - 3 sets - 10 reps

## 2021-08-05 NOTE — Therapy (Signed)
[facility] Physical Therapy [address] [place], [place], [address] Phone: [phone]   Fax:  [phone]  Physical Therapy Evaluation  Patient Details  Name: Deborah Jacobson MRN: [id number] Date of Birth: 14-Sep-1981 Referring Provider (PT): Gershon Mussel MD   Encounter Date: 08/05/2021   PT End of Session - 08/05/21 1313     Visit Number 1    Number of Visits 10    Date for PT Re-Evaluation 09/16/21    PT Start Time 1110    PT Stop Time 1150    PT Time Calculation (min) 40 min    Activity Tolerance Patient tolerated treatment well             Past Medical History:  Diagnosis Date   Acid reflux    Diabetes in pregnancy    Hypertension    Seasonal allergies     Past Surgical History:  Procedure Laterality Date   CESAREAN SECTION     SHOULDER ARTHROSCOPY WITH BICEPS TENDON REPAIR Left 08/29/2020   Procedure: LEFT SHOULDER ARTHROSCOPY WITH BICEPS TENODESIS;  Surgeon: Tarry Kos, MD;  Location: [facility];  Service: Orthopedics;  Laterality: Left;   TUBAL LIGATION      There were no vitals filed for this visit.    Subjective Assessment - 08/05/21 1115     Subjective She relays she has had thoracic pain around her bra strap for the last month. No known mechanism of injury. Of note she had recent ED visit for abdominal pain on 07/02/21 per ED notes "there was No other significant lab abnormalities.  CT scan of the abdomen and pelvis without any acute findings to explain the pain.  May be menstrual related."    Patient is accompained by: Family member   daughter   Pertinent History PMH: Lt shoulder scope with biceps tenodesis 10/21, DM,HTN    Limitations Lifting;Standing;House hold activities;Sitting    Diagnostic tests "CT scan of the abdomen and pelvis without any acute findings"    Patient Stated Goals reduce pain    Currently in Pain? Yes    Pain Score 6     Pain Location Back    Pain Orientation Right;Left;Mid;Upper     Pain Descriptors / Indicators Aching;Pounding    Pain Type Acute pain   acute on chronic   Pain Onset More than a month ago    Pain Frequency Intermittent    Aggravating Factors  sitting without back support, walking or standing more than an hour, cold air    Pain Relieving Factors pain meds,    Multiple Pain Sites No                [facility] PT Assessment - 08/05/21 0001       Assessment   Medical Diagnosis thoracic pain, low back pain    Referring Provider (PT) Gershon Mussel MD    Onset Date/Surgical Date --   one month onset of pain, acute on chronic   Next MD Visit not scheduled    Prior Therapy PT for her Lt shoulder      Precautions   Precaution Comments none      Restrictions   Weight Bearing Restrictions No      Balance Screen   Has the patient fallen in the past 6 months No    Has the patient had a decrease in activity level because of a fear of falling?  No    Is the patient reluctant to leave their home because  of a fear of falling?  No      Home [employer]ist information centre manager residence      Prior Function   Level of Independence Independent      Cognition   Overall Cognitive Status Within Functional Limits for tasks assessed      Observation/Other Assessments   Focus on Therapeutic Outcomes (FOTO)  67% functional, goal 81%      Posture/Postural Control   Posture Comments Rt lateral trunk lean, slumped posture and knee hyperextension      ROM / Strength   AROM / PROM / Strength AROM;Strength      AROM   AROM Assessment Site Lumbar    Lumbar Flexion WNL    Lumbar Extension WNL    Lumbar - Right Side Bend WNL    Lumbar - Left Side Bend WNL    Lumbar - Right Rotation WNL    Lumbar - Left Rotation WNL      Strength   Strength Assessment Site Shoulder;Elbow    Right/Left Shoulder Right;Left    Right Shoulder Flexion 5/5    Right Shoulder Extension 5/5    Right Shoulder ABduction 5/5    Right Shoulder Internal Rotation 5/5    Right  Shoulder External Rotation 5/5    Left Shoulder Flexion 4+/5    Left Shoulder ABduction 4/5    Left Shoulder Internal Rotation 5/5    Left Shoulder External Rotation 4+/5    Right/Left Elbow Right;Left    Right Elbow Flexion 5/5    Right Elbow Extension 5/5    Left Elbow Flexion 5/5    Left Elbow Extension 5/5      Palpation   Palpation comment very TTP in thoracic spine, rhomboids, infraspinatus, supraspinatus, traps                        Objective measurements completed on examination: See above findings.       [facility] Adult PT Treatment/Exercise - 08/05/21 0001       Modalities   Modalities Electrical Stimulation;Moist Heat      Moist Heat Therapy   Number Minutes Moist Heat 10 Minutes    Moist Heat Location Other (comment)   thoracic spine     Electrical Stimulation   Electrical Stimulation Location thoracic    Electrical Stimulation Action IFC    Electrical Stimulation Parameters to tolerance    Electrical Stimulation Goals Pain                     PT Education - 08/05/21 1313     Education Details HEP,POC    Person(s) Educated Patient    Methods Explanation;Demonstration;Handout;Verbal cues    Comprehension Verbalized understanding;Need further instruction                 PT Long Term Goals - 08/05/21 1322       PT LONG TERM GOAL #1   Title I with long-term HEP.    Time 6    Period Weeks    Status New    Target Date 09/16/21      PT LONG TERM GOAL #2   Title [name] will report thoracic pain consistently 0-2/10 on the Numeric Pain Rating Scale.    Time 6    Period Weeks    Status New      PT LONG TERM GOAL #3   Title Improve FOTO score to 81% functional.    Time 6  Period Weeks    Status New      PT LONG TERM GOAL #4   Title Improve Lt shoulder strength to 4+ grossly to improve function    Time 6    Period Weeks    Status New      PT LONG TERM GOAL #5   Title -                    Plan -  08/05/21 1314     Clinical Impression Statement Pt was referred to PT for low back pain but this is more thoracic pain located. This appears to be muscular in nature with her Lt scapular area but also likely some thoracic spine OA pain contributing. Of note she did have Lt shoulder scope with biceps tnodesis on 08/30/21 and has stopped doing her exercises for this. I did reprint her shoulder program at her request as well as provided her with thoracic-lumbar exercises as well. She will benefit from skilled PT to address her functional defiicts listed below.    Personal Factors and Comorbidities Comorbidity 2    Comorbidities PMH: Lt shoulder scope with biceps tenodesis 10/21, DM,HTN    Examination-Activity Limitations Bend;Carry;Lift;Stand;Sleep;Locomotion Level    Examination-Participation Restrictions Cleaning;Community Activity;Laundry;Yard Work;Shop    Stability/Clinical Decision Making Evolving/Moderate complexity    Clinical Decision Making Moderate    Rehab Potential Good    PT Frequency 2x / week   1-2   PT Duration 6 weeks    PT Treatment/Interventions ADLs/Self Care Home Management;Cryotherapy;Electrical Stimulation;Iontophoresis 4mg /ml Dexamethasone;Moist Heat;Traction;Ultrasound;Therapeutic activities;Therapeutic exercise;Neuromuscular re-education;Manual techniques;Joint Manipulations;Spinal Manipulations;Dry needling;Passive range of motion    PT Next Visit Plan reivew thoracic HEP, needs scapular strength program, consider manual or modalaties for pain, possible DN in future if she is open to this    PT Home Exercise Plan Access Code: [email or web address]    Consulted and Agree with Plan of Care Patient             Patient will benefit from skilled therapeutic intervention in order to improve the following deficits and impairments:  Decreased activity tolerance, Decreased strength, Postural dysfunction, Difficulty walking, Increased muscle spasms, Pain, Impaired flexibility  Visit  Diagnosis: Pain in thoracic spine  Chronic bilateral low back pain without sciatica  Muscle weakness (generalized)  Abnormal posture     Problem List Patient Active Problem List   Diagnosis Date Noted   Subacromial bursitis of left shoulder joint 08/29/2020   Tendinopathy of left rotator cuff 08/29/2020   Morbid obesity with BMI of 45.0-49.9, adult (HCC) 08/09/2020   Superior glenoid labrum lesion of left shoulder 07/10/2020   Health maintenance examination 03/23/2019   Prediabetes 2018/12/11   Strain of back Dec 11, 2018   Death of family member 12/11/18   Back pain 07/31/2018   Fatigue 05/31/2018   Chronic low back pain 11/30/2017   Shifting sleep-work schedule 06/15/2017   Paradoxical insomnia 06/15/2017   Sleep related headaches 06/15/2017   Super obese 06/15/2017   Mood complaints in sleep disorder 06/15/2017   Plantar fasciitis of left foot 03/01/2014   Porokeratosis 03/01/2014   Pain in lower limb 03/01/2014    03/03/2014, PT,DPT 08/05/2021, 1:25 PM  [facility] Physical Therapy [address] [place], [place], [place] Phone: [phone]   Fax:  [phone]  Name: Deborah Jacobson MRN: [name] Date of Birth: 22-Mar-1981

## 2021-08-08 ENCOUNTER — Encounter: Payer: Self-pay | Admitting: Orthopaedic Surgery

## 2021-08-09 ENCOUNTER — Other Ambulatory Visit: Payer: Self-pay | Admitting: Physician Assistant

## 2021-08-09 MED ORDER — TRAMADOL HCL 50 MG PO TABS: 50.0000 mg | ORAL_TABLET | Freq: Two times a day (BID) | ORAL | 0 refills | Status: DC | PRN

## 2021-08-09 NOTE — Progress Notes (Addendum)
GYNECOLOGY ANNUAL PREVENTATIVE CARE ENCOUNTER NOTE  History:     Deborah Jacobson is a 40 y.o. [phone] female here for a routine annual gynecologic exam.    Current complaints: side and back pain that started a month ago.  She had a w/u with PCP and was negative. It is managed with medicines as prescribed by them. Has RLQ pain - uses heating pad. Takes tylenol which helps. 4/10. Was worse when she had a CT.   She has irregular periods - in her past but more recently in the last year they have been monthly. Has heavy bleeding. Not on anything hormonal. Has odorous discharge that vagisil helps. She does not want to do anything for her bleeding.   Denies abnormal vaginal bleeding, discharge, pelvic pain, problems with intercourse or other gynecologic concerns.   She is sexually active without complaint.   Gynecologic History No LMP recorded. (Menstrual status: Irregular Periods). Contraception: tubal ligation Last Pap: 2019. Result was normal with negative HPV per pt Last Mammogram: not yet done - we will schedule   Obstetric History OB History  Gravida Para Term Preterm AB Living  6 4 4   2 4   SAB IAB Ectopic Multiple Live Births  1 1     4     # Outcome Date GA Lbr Len/2nd Weight Sex Delivery Anes PTL Lv  6 Term           5 IAB           4 SAB           3 Term           2 Term           1 Term             Past Medical History:  Diagnosis Date   Acid reflux    Diabetes in pregnancy    Hypertension    Seasonal allergies     Past Surgical History:  Procedure Laterality Date   CESAREAN SECTION     SHOULDER ARTHROSCOPY WITH BICEPS TENDON REPAIR Left 08/29/2020   Procedure: LEFT SHOULDER ARTHROSCOPY WITH BICEPS TENODESIS;  Surgeon: , MD;  Location: [facility];  Service: Orthopedics;  Laterality: Left;   TUBAL LIGATION      Current Outpatient Medications on File Prior to Visit  Medication Sig Dispense Refill   albuterol (VENTOLIN HFA) 108  (90 Base) MCG/ACT inhaler INHALE 2 PUFFS INTO THE LUNGS EVERY 6 HOURS AS NEEDED FOR WHEEZING OR SHORTNESS OF BREATH 6.7 g 1   diclofenac Sodium (VOLTAREN) 1 % GEL Apply 2 g topically 4 (four) times daily. 100 g 2   diphenhydrAMINE (BENADRYL) 25 MG tablet Take 25 mg by mouth every 6 (six) hours as needed for allergies.     hydrochlorothiazide (HYDRODIURIL) 12.5 MG tablet TAKE 1 TABLET(12.5 MG) BY MOUTH DAILY 90 tablet 1   hydrOXYzine (ATARAX/VISTARIL) 25 MG tablet Take 1 tablet (25 mg total) by mouth 3 (three) times daily as needed. 30 tablet 2   meloxicam (MOBIC) 15 MG tablet Take 0.5-1 tablets (7.5-15 mg total) by mouth daily as needed for pain. 30 tablet 6   OZEMPIC, 1 MG/DOSE, 4 MG/3ML SOPN INJECT 1MG  UNDER THE SKIN ONCE A WEEK 9 mL 1   traMADol (ULTRAM) 50 MG tablet Take 1 tablet (50 mg total) by mouth 2 (two) times daily as needed. 30 tablet 0   traZODone (DESYREL) 50 MG tablet Take 1 tablet (  50 mg total) by mouth at bedtime as needed for sleep. 30 tablet 0   [DISCONTINUED] cetirizine (ZYRTEC) 10 MG tablet Take 10 mg by mouth daily.     No current facility-administered medications on file prior to visit.    Allergies  Allergen Reactions   Latex Hives    Powder from gloves    Social History:  reports that she has quit smoking. She has never used smokeless tobacco. She reports that she does not drink alcohol and does not use drugs.  Family History  Problem Relation Age of Onset   Diabetes Mother    Liver disease Mother    Hypertension Mother     The following portions of the patient's history were reviewed and updated as appropriate: allergies, current medications, past family history, past medical history, past social history, past surgical history and problem list.  Review of Systems Pertinent items noted in HPI and remainder of comprehensive ROS otherwise negative.  Physical Exam:  There were no vitals taken for this visit. CONSTITUTIONAL: Well-developed, well-nourished  female in no acute distress.  HENT:  Normocephalic, atraumatic, External right and left ear normal.  EYES: Conjunctivae and EOM are normal. Pupils are equal, round, and reactive to light. No scleral icterus.  NECK: Normal range of motion, supple, no masses.  Normal thyroid.  SKIN: Skin is warm and dry. No rash noted. Not diaphoretic. No erythema. No pallor. MUSCULOSKELETAL: Normal range of motion. No tenderness.  No cyanosis, clubbing, or edema. NEUROLOGIC: Alert and oriented to person, place, and time. Normal reflexes, muscle tone coordination.  PSYCHIATRIC: Normal mood and affect. Normal behavior. Normal judgment and thought content.  CARDIOVASCULAR: Normal heart rate noted, regular rhythm RESPIRATORY: Clear to auscultation bilaterally. Effort and breath sounds normal, no problems with respiration noted.  BREASTS: Symmetric in size. No masses, tenderness, skin changes, nipple drainage, or lymphadenopathy bilaterally. Performed in the presence of a chaperone. ABDOMEN: Soft, no distention noted.  No tenderness, rebound or guarding.  PELVIC: External genitalia normal, Vagina normal without discharge, Urethra without abnormality or discharge, bladder tenderness, cervix normal in appearance, no CMT, uterus normal size, shape, and consistency, no adnexal masses or tenderness. Performed in the presence of a chaperone.    Assessment and Plan:    1. Encounter for annual routine gynecological examination - Cervical cancer screening: Discussed guidelines. Pap with HPV done  - STD Testing: Declines - Birth Control: Discussed options and their risks, benefits and common side effects; discussed VTE with estrogen containing options. Declines birth control - Breast Health: Encouraged self breast awareness/SBE. Teaching provided. Discussed limits of clinical breast exam for detecting breast cancer. Rx given for MXR by PCP. Reviewed importance of going for it - For bladder pain, we will check Ucx. Discussed  RLQ pain could be hernia related - she has known umbilical, could also have inguinal. She was previously referred to plastic surgeon - she will f/u with them.  - F/U 12 months and prn  2. Encounter for screening for malignant neoplasm of breast, unspecified screening modality - MXR ordered  Routine preventative health maintenance measures emphasized. Please refer to After Visit Summary for other counseling recommendations.      Milas Hock, MD, FACOG Obstetrician & Gynecologist, [facility], [facility]

## 2021-08-09 NOTE — Telephone Encounter (Signed)
Sent in

## 2021-08-12 ENCOUNTER — Encounter: Payer: Self-pay | Admitting: Obstetrics and Gynecology

## 2021-08-12 ENCOUNTER — Other Ambulatory Visit: Payer: Self-pay

## 2021-08-12 ENCOUNTER — Other Ambulatory Visit (HOSPITAL_COMMUNITY)
Admission: RE | Admit: 2021-08-12 | Discharge: 2021-08-12 | Disposition: A | Discharge status: Home / Self Care | Payer: 59 | Source: Ambulatory Visit | Attending: Family Medicine | Admitting: Family Medicine

## 2021-08-12 ENCOUNTER — Encounter: Payer: 59 | Admitting: Physical Therapy

## 2021-08-12 ENCOUNTER — Ambulatory Visit (INDEPENDENT_AMBULATORY_CARE_PROVIDER_SITE_OTHER): Payer: 59 | Admitting: Obstetrics and Gynecology

## 2021-08-12 VITALS — BP 129/84 | HR 83 | Ht <= 58 in | Wt 201.2 lb

## 2021-08-12 DIAGNOSIS — Z01419 Encounter for gynecological examination (general) (routine) without abnormal findings: Secondary | ICD-10-CM | POA: Insufficient documentation

## 2021-08-12 DIAGNOSIS — R3989 Other symptoms and signs involving the genitourinary system: Secondary | ICD-10-CM | POA: Diagnosis not present

## 2021-08-12 DIAGNOSIS — Z1239 Encounter for other screening for malignant neoplasm of breast: Secondary | ICD-10-CM

## 2021-08-12 NOTE — Progress Notes (Deleted)
GYNECOLOGY ANNUAL PREVENTATIVE CARE ENCOUNTER NOTE  RLQ admona pain, lasting for awhile, heating pad have not, Tylenol helping much, to your lower back.  4/5-10   Irregular: 1 month, every 3 onths, started since first chid. Back pain, ausea, lower leg pain; heavy bleeding, dried blood, no OCPs, no discharge;   Feels like a sensitive vagaina, odorouse, sweaty (happens when she sits down)   Pap smear done.   Still need a mammogram       History:     Deborah Jacobson is a 40 y.o. [phone] female here for a routine annual gynecologic exam.  Current complaints: ***.      Denies abnormal vaginal bleeding, discharge, pelvic pain, problems with intercourse or other gynecologic concerns.      Gynecologic History Patient's last menstrual period was 07/28/2021 (approximate). Contraception: {method:5051} Last Pap: 2019. Result was normal with negative HPV per pt Last Mammogram: ***.  Result was {norm/abn:16337}   Obstetric History OB History  Gravida Para Term Preterm AB Living  6 4 4   2 4   SAB IAB Ectopic Multiple Live Births  1 1     4     # Outcome Date GA Lbr Len/2nd Weight Sex Delivery Anes PTL Lv  6 Term           5 IAB           4 SAB           3 Term           2 Term           1 Term             Past Medical History:  Diagnosis Date   Acid reflux    Diabetes in pregnancy    Hypertension    Seasonal allergies     Past Surgical History:  Procedure Laterality Date   CESAREAN SECTION     SHOULDER ARTHROSCOPY WITH BICEPS TENDON REPAIR Left 08/29/2020   Procedure: LEFT SHOULDER ARTHROSCOPY WITH BICEPS TENODESIS;  Surgeon: , MD;  Location: [facility];  Service: Orthopedics;  Laterality: Left;   TUBAL LIGATION      Current Outpatient Medications on File Prior to Visit  Medication Sig Dispense Refill   diclofenac Sodium (VOLTAREN) 1 % GEL Apply 2 g topically 4 (four) times daily. 100 g 2   hydrochlorothiazide (HYDRODIURIL) 12.5 MG  tablet TAKE 1 TABLET(12.5 MG) BY MOUTH DAILY 90 tablet 1   hydrOXYzine (ATARAX/VISTARIL) 25 MG tablet Take 1 tablet (25 mg total) by mouth 3 (three) times daily as needed. 30 tablet 2   meloxicam (MOBIC) 15 MG tablet Take 0.5-1 tablets (7.5-15 mg total) by mouth daily as needed for pain. 30 tablet 6   OZEMPIC, 1 MG/DOSE, 4 MG/3ML SOPN INJECT 1MG  UNDER THE SKIN ONCE A WEEK 9 mL 1   traMADol (ULTRAM) 50 MG tablet Take 1 tablet (50 mg total) by mouth 2 (two) times daily as needed. 30 tablet 0   traZODone (DESYREL) 50 MG tablet Take 1 tablet (50 mg total) by mouth at bedtime as needed for sleep. 30 tablet 0   albuterol (VENTOLIN HFA) 108 (90 Base) MCG/ACT inhaler INHALE 2 PUFFS INTO THE LUNGS EVERY 6 HOURS AS NEEDED FOR WHEEZING OR SHORTNESS OF BREATH 6.7 g 1   diphenhydrAMINE (BENADRYL) 25 MG tablet Take 25 mg by mouth every 6 (six) hours as needed for allergies.     [DISCONTINUED] cetirizine (ZYRTEC) 10 MG  tablet Take 10 mg by mouth daily.     No current facility-administered medications on file prior to visit.    Allergies  Allergen Reactions   Latex Hives    Powder from gloves    Social History:  reports that she has quit smoking. She has never used smokeless tobacco. She reports that she does not drink alcohol and does not use drugs.  Family History  Problem Relation Age of Onset   Diabetes Mother    Liver disease Mother    Hypertension Mother     The following portions of the patient's history were reviewed and updated as appropriate: allergies, current medications, past family history, past medical history, past social history, past surgical history and problem list.  Review of Systems Pertinent items noted in HPI and remainder of comprehensive ROS otherwise negative.  Physical Exam:  BP 129/84   Pulse 83   Ht 4\' 9"  (1.448 m)   Wt 201 lb 3.2 oz (91.3 kg)   LMP 07/28/2021 (Approximate)   BMI 43.54 kg/m  CONSTITUTIONAL: Well-developed, well-nourished female in no acute  distress.  HENT:  Normocephalic, atraumatic, External right and left ear normal.  EYES: Conjunctivae and EOM are normal. Pupils are equal, round, and reactive to light. No scleral icterus.  NECK: Normal range of motion, supple, no masses.  Normal thyroid.  SKIN: Skin is warm and dry. No rash noted. Not diaphoretic. No erythema. No pallor. MUSCULOSKELETAL: Normal range of motion. No tenderness.  No cyanosis, clubbing, or edema. NEUROLOGIC: Alert and oriented to person, place, and time. Normal reflexes, muscle tone coordination.  PSYCHIATRIC: Normal mood and affect. Normal behavior. Normal judgment and thought content.  CARDIOVASCULAR: Normal heart rate noted, regular rhythm RESPIRATORY: Clear to auscultation bilaterally. Effort and breath sounds normal, no problems with respiration noted.  BREASTS: Symmetric in size. No masses, tenderness, skin changes, nipple drainage, or lymphadenopathy bilaterally. Performed in the presence of a chaperone. ABDOMEN: Soft, no distention noted.  No tenderness, rebound or guarding.  PELVIC: {PELVIC:22980}. Performed in the presence of a chaperone.    Assessment and Plan:    1. Encounter for annual routine gynecological examination *** - Cervical cancer screening: Discussed guidelines. Pap with HPV done  - Gardasil: *** - STD Testing: *** - Birth Control: Discussed options and their risks, benefits and common side effects; discussed VTE with estrogen containing options. Desires: {Birth control type:23956} - Breast Health: Encouraged self breast awareness/SBE. Teaching provided. Discussed limits of clinical breast exam for detecting breast cancer. Rx given for MXR by PCP. Reviewed importance of going for it - F/U 12 months and prn   2. Encounter for screening for malignant neoplasm of breast, unspecified screening modality - MXR ordered     Routine preventative health maintenance measures emphasized. Please refer to After Visit Summary for other  counseling recommendations.      07/30/2021, MD, FACOG Obstetrician & Gynecologist, [facility], [facility]

## 2021-08-14 ENCOUNTER — Encounter: Payer: 59 | Admitting: Family Medicine

## 2021-08-14 LAB — CYTOLOGY - PAP
Adequacy: ABSENT
Comment: NEGATIVE
Diagnosis: NEGATIVE
High risk HPV: NEGATIVE

## 2021-08-15 LAB — URINE CULTURE

## 2021-08-21 ENCOUNTER — Encounter: Payer: Self-pay | Admitting: Physical Therapy

## 2021-08-21 ENCOUNTER — Ambulatory Visit (INDEPENDENT_AMBULATORY_CARE_PROVIDER_SITE_OTHER): Payer: 59 | Admitting: Physical Therapy

## 2021-08-21 ENCOUNTER — Other Ambulatory Visit: Payer: Self-pay

## 2021-08-21 DIAGNOSIS — G8929 Other chronic pain: Secondary | ICD-10-CM

## 2021-08-21 DIAGNOSIS — M546 Pain in thoracic spine: Secondary | ICD-10-CM | POA: Diagnosis not present

## 2021-08-21 DIAGNOSIS — R293 Abnormal posture: Secondary | ICD-10-CM

## 2021-08-21 DIAGNOSIS — M545 Low back pain, unspecified: Secondary | ICD-10-CM

## 2021-08-21 DIAGNOSIS — M6281 Muscle weakness (generalized): Secondary | ICD-10-CM

## 2021-08-21 NOTE — Therapy (Signed)
[facility] Physical Therapy [address] [place], [place], [address] Phone: [phone]   Fax:  [phone]  Physical Therapy Treatment  Patient Details  Name: Deborah Jacobson MRN: [id number] Date of Birth: 11-06-1981 Referring Provider (PT): Gershon Mussel MD   Encounter Date: 08/21/2021   PT End of Session - 08/21/21 1118     Visit Number 2    Number of Visits 10    Date for PT Re-Evaluation 09/16/21    PT Start Time 1114    PT Stop Time 1154    PT Time Calculation (min) 40 min    Activity Tolerance Patient tolerated treatment well    Behavior During Therapy [facility] for tasks assessed/performed             Past Medical History:  Diagnosis Date   Acid reflux    Diabetes in pregnancy    Hypertension    Seasonal allergies     Past Surgical History:  Procedure Laterality Date   CESAREAN SECTION     SHOULDER ARTHROSCOPY WITH BICEPS TENDON REPAIR Left 08/29/2020   Procedure: LEFT SHOULDER ARTHROSCOPY WITH BICEPS TENODESIS;  Surgeon: Tarry Kos, MD;  Location: [facility];  Service: Orthopedics;  Laterality: Left;   TUBAL LIGATION      There were no vitals filed for this visit.   Subjective Assessment - 08/21/21 1117     Subjective Pt states she is in no pain today. Pt stating she has been working on her HEP.    Pertinent History PMH: Lt shoulder scope with biceps tenodesis 10/21, DM,HTN    Limitations Lifting;Standing;House hold activities;Sitting    Diagnostic tests "CT scan of the abdomen and pelvis without any acute findings"    Patient Stated Goals reduce pain    Currently in Pain? No/denies                [facility] PT Assessment - 08/21/21 0001       Assessment   Medical Diagnosis thoracic pain, low back pain    Referring Provider (PT) Gershon Mussel MD                           [facility] Adult PT Treatment/Exercise - 08/21/21 0001       Exercises   Exercises Lumbar      Lumbar Exercises: Stretches    Lower Trunk Rotation 4 reps;30 seconds    Other Lumbar Stretch Exercise lying over 1/2 bolster in horizontal position x 2 minutes, and then in vertical position x 2 minutes      Lumbar Exercises: Aerobic   Recumbent Bike L3 x 5 minutes      Lumbar Exercises: Standing   Row Strengthening;Both;10 reps;Theraband    Theraband Level (Row) Level 3 (Green)    Shoulder Extension Strengthening;Both;10 reps    Shoulder Extension Limitations 2# bar    Other Standing Lumbar Exercises shoulder flexion with 2# bar in mini squat against wall x 10      Lumbar Exercises: Prone   Opposite Arm/Leg Raise 10 reps;3 seconds    Other Prone Lumbar Exercises press ups x 5 holding 10 seconds each      Lumbar Exercises: Quadruped   Madcat/Old Horse 5 reps;Limitations    Madcat/Old Horse Limitations each direction holding 10 seconds                          PT Long Term Goals - 08/21/21  1121       PT LONG TERM GOAL #1   Title I with long-term HEP.    Status On-going      PT LONG TERM GOAL #2   Title [name] will report thoracic pain consistently 0-2/10 on the Numeric Pain Rating Scale.    Status On-going      PT LONG TERM GOAL #3   Title Improve FOTO score to 81% functional.    Status On-going      PT LONG TERM GOAL #4   Title Improve Lt shoulder strength to 4+ grossly to improve function    Status On-going      PT LONG TERM GOAL #5   Baseline -                   Plan - 08/21/21 1120     Clinical Impression Statement Pt arriving today almost 15 minutes late for her appointment. Pt  reporting compliance in her HEP which was reviewed and pt able to return demonstration. Pt with no pain reported today, but states it depends on how she is lying down. Pt stating her pain is worse at night. Pt tolerating thoracic/lumbar stretching and general strengthening exercises well. Continue skilled PT to maximize function.    Personal Factors and Comorbidities Comorbidity 2     Comorbidities PMH: Lt shoulder scope with biceps tenodesis 10/21, DM,HTN    Examination-Activity Limitations Bend;Carry;Lift;Stand;Sleep;Locomotion Level    Examination-Participation Restrictions Cleaning;Community Activity;Laundry;Yard Work;Shop    Stability/Clinical Decision Making Evolving/Moderate complexity    Rehab Potential Good    PT Frequency 2x / week    PT Duration 6 weeks    PT Treatment/Interventions ADLs/Self Care Home Management;Cryotherapy;Electrical Stimulation;Iontophoresis 4mg /ml Dexamethasone;Moist Heat;Traction;Ultrasound;Therapeutic activities;Therapeutic exercise;Neuromuscular re-education;Manual techniques;Joint Manipulations;Spinal Manipulations;Dry needling;Passive range of motion    PT Next Visit Plan needs scapular strength program, consider manual or modalaties for pain, possible DN in future if she is open to this    PT Home Exercise Plan Access Code: [email or web address]    Consulted and Agree with Plan of Care Patient             Patient will benefit from skilled therapeutic intervention in order to improve the following deficits and impairments:  Decreased activity tolerance, Decreased strength, Postural dysfunction, Difficulty walking, Increased muscle spasms, Pain, Impaired flexibility  Visit Diagnosis: Pain in thoracic spine  Chronic bilateral low back pain without sciatica  Muscle weakness (generalized)  Abnormal posture     Problem List Patient Active Problem List   Diagnosis Date Noted   Subacromial bursitis of left shoulder joint 08/29/2020   Tendinopathy of left rotator cuff 08/29/2020   Morbid obesity with BMI of 45.0-49.9, adult (HCC) 08/09/2020   Superior glenoid labrum lesion of left shoulder 07/10/2020   Health maintenance examination 03/23/2019   Prediabetes 2018/12/06   Strain of back 12-06-18   Death of family member 2018-12-06   Back pain 07/31/2018   Fatigue 05/31/2018   Chronic low back pain 11/30/2017   Shifting sleep-work  schedule 06/15/2017   Paradoxical insomnia 06/15/2017   Sleep related headaches 06/15/2017   Super obese 06/15/2017   Mood complaints in sleep disorder 06/15/2017   Plantar fasciitis of left foot 03/01/2014   Porokeratosis 03/01/2014   Pain in lower limb 03/01/2014    03/03/2014, PT, MPT 08/21/2021, 11:58 AM  [facility] Physical Therapy [address] [place], [place], [place] Phone: [phone]   Fax:  [phone]  Name: Deborah Jacobson MRN: [name] Date of  Birth: 1980-12-15

## 2021-08-26 ENCOUNTER — Encounter: Payer: 59 | Admitting: Physical Therapy

## 2021-09-02 ENCOUNTER — Encounter: Payer: Self-pay | Admitting: Physical Therapy

## 2021-09-02 ENCOUNTER — Other Ambulatory Visit: Payer: Self-pay

## 2021-09-02 ENCOUNTER — Ambulatory Visit (INDEPENDENT_AMBULATORY_CARE_PROVIDER_SITE_OTHER): Payer: 59 | Admitting: Physical Therapy

## 2021-09-02 DIAGNOSIS — R293 Abnormal posture: Secondary | ICD-10-CM | POA: Diagnosis not present

## 2021-09-02 DIAGNOSIS — M546 Pain in thoracic spine: Secondary | ICD-10-CM | POA: Diagnosis not present

## 2021-09-02 DIAGNOSIS — M545 Low back pain, unspecified: Secondary | ICD-10-CM

## 2021-09-02 DIAGNOSIS — G8929 Other chronic pain: Secondary | ICD-10-CM

## 2021-09-02 DIAGNOSIS — M6281 Muscle weakness (generalized): Secondary | ICD-10-CM

## 2021-09-02 NOTE — Therapy (Addendum)
[facility] Physical Therapy [address] [place], [place], [address] Phone: [phone]   Fax:  [phone]  Physical Therapy Treatment/Discharge addendum PHYSICAL THERAPY DISCHARGE SUMMARY  Visits from Start of Care: 3  Current functional level related to goals / functional outcomes: See below   Remaining deficits: See below   Education / Equipment: HEP Plan: Patient goals were not met. Patient is being discharged due to not returning since last visit      Patient Details  Name: MERCADES BAJAJ MRN: [id number] Date of Birth: [birth year] Referring Provider (PT): Frankey Shown MD   Encounter Date: 09/02/2021   PT End of Session - 09/02/21 1229     Visit Number 3    Number of Visits 10    Date for PT Re-Evaluation 09/16/21    PT Start Time 1146    PT Stop Time 1239    PT Time Calculation (min) 53 min    Activity Tolerance Patient tolerated treatment well    Behavior During Therapy WFL for tasks assessed/performed             Past Medical History:  Diagnosis Date   Acid reflux    Diabetes in pregnancy    Hypertension    Seasonal allergies     Past Surgical History:  Procedure Laterality Date   CESAREAN SECTION     SHOULDER ARTHROSCOPY WITH BICEPS TENDON REPAIR Left 08/29/2020   Procedure: LEFT SHOULDER ARTHROSCOPY WITH BICEPS TENODESIS;  Surgeon: Leandrew Koyanagi, MD;  Location: [place];  Service: Orthopedics;  Laterality: Left;   TUBAL LIGATION      There were no vitals filed for this visit.   Subjective Assessment - 09/02/21 1207     Subjective Pt states about 2-3 out of 10 pain in her upper thoracic today, may have overdone it with work and church service over the weekend.    Patient is accompained by: Family member   daughter   Pertinent History PMH: Lt shoulder scope with biceps tenodesis 10/21, DM,HTN    Limitations Lifting;Standing;House hold activities;Sitting    Diagnostic tests "CT scan of the abdomen and  pelvis without any acute findings"    Patient Stated Goals reduce pain    Pain Onset More than a month ago               [facility] Adult PT Treatment/Exercise - 09/02/21 0001       Lumbar Exercises: Stretches   Other Lumbar Stretch Exercise lying over 1/2 bolster in horizontal position with 10 bilat shoulder flexions holding 5 sec X15, then in vertical position with bilat horizontal abduction holding 5 sec X15    Other Lumbar Stretch Exercise sidelying thoracic rotation stretch 10 sec X5 blat, childs pose stretch 30 sec X1 fwd,  X1 Rt and X 1 Lt      Lumbar Exercises: Aerobic   UBE (Upper Arm Bike) L3 X 5 min switch directions half way    Recumbent Bike L3 x 5 minutes      Lumbar Exercises: Standing   Scapular Retraction Limitations bilat ER with scap retraction with green 2X10    Row 20 reps    Theraband Level (Row) Level 3 (Green)    Shoulder Extension 20 reps    Theraband Level (Shoulder Extension) Level 3 (Green)      Lumbar Exercises: Prone   Other Prone Lumbar Exercises on pball for bilat Y's and T's holding 2 sec X10 ea      Lumbar Exercises: Quadruped  Madcat/Old Horse 10 reps    Madcat/Old Horse Limitations holding 5 sec ea      Moist Heat Therapy   Number Minutes Moist Heat 10 Minutes    Moist Heat Location --   thoracic     Electrical Stimulation   Electrical Stimulation Location thoracic    Electrical Stimulation Action IFC    Electrical Stimulation Parameters to tolerance for 10 min    Electrical Stimulation Goals Pain                          PT Long Term Goals - 08/21/21 1121       PT LONG TERM GOAL #1   Title I with long-term HEP.    Status On-going      PT LONG TERM GOAL #2   Title Florentine will report thoracic pain consistently 0-2/10 on the Numeric Pain Rating Scale.    Status On-going      PT LONG TERM GOAL #3   Title Improve FOTO score to 81% functional.    Status On-going      PT LONG TERM GOAL #4   Title Improve Lt  shoulder strength to 4+ grossly to improve function    Status On-going      PT LONG TERM GOAL #5   Baseline -                   Plan - 09/02/21 1230     Clinical Impression Statement Progressed her thoraco-lumbar mobility and strength program with good overall tolerance. She request MHP and TENS after session and had good response with this. Continue POC.    Personal Factors and Comorbidities Comorbidity 2    Comorbidities PMH: Lt shoulder scope with biceps tenodesis 10/21, DM,HTN    Examination-Activity Limitations Bend;Carry;Lift;Stand;Sleep;Locomotion Level    Examination-Participation Restrictions Cleaning;Community Activity;Laundry;Yard Work;Shop    Stability/Clinical Decision Making Evolving/Moderate complexity    Rehab Potential Good    PT Frequency 2x / week    PT Duration 6 weeks    PT Treatment/Interventions ADLs/Self Care Home Management;Cryotherapy;Electrical Stimulation;Iontophoresis 15m/ml Dexamethasone;Moist Heat;Traction;Ultrasound;Therapeutic activities;Therapeutic exercise;Neuromuscular re-education;Manual techniques;Joint Manipulations;Spinal Manipulations;Dry needling;Passive range of motion    PT Next Visit Plan needs scapular strength and mobility consider manual or modalaties for pain, possible DN in future if she is open to this    PT Home Exercise Plan Access Code: [email or web address]   Consulted and Agree with Plan of Care Patient             Patient will benefit from skilled therapeutic intervention in order to improve the following deficits and impairments:  Decreased activity tolerance, Decreased strength, Postural dysfunction, Difficulty walking, Increased muscle spasms, Pain, Impaired flexibility  Visit Diagnosis: Pain in thoracic spine  Chronic bilateral low back pain without sciatica  Muscle weakness (generalized)  Abnormal posture     Problem List Patient Active Problem List   Diagnosis Date Noted   Subacromial bursitis of left shoulder  joint 08/29/2020   Tendinopathy of left rotator cuff 08/29/2020   Morbid obesity with BMI of 45.0-49.9, adult ([facility] 08/09/2020   Superior glenoid labrum lesion of left shoulder 07/10/2020   Health maintenance examination 03/23/2019   Prediabetes 02020-02-07  Strain of back 002/07/20  Death of family member 0February 07, 2020  Back pain 07/31/2018   Fatigue 05/31/2018   Chronic low back pain 11/30/2017   Shifting sleep-work schedule 06/15/2017   Paradoxical insomnia 06/15/2017   Sleep related headaches 06/15/2017  Super obese 06/15/2017   Mood complaints in sleep disorder 06/15/2017   Plantar fasciitis of left foot 03/01/2014   Porokeratosis 03/01/2014   Pain in lower limb 03/01/2014    Debbe Odea, PT,DPT 09/02/2021, 12:33 PM  [facility] Physical Therapy [address] [place], [place], [address] Phone: [phone]   Fax:  [phone]  Name: PALMINA CLODFELTER MRN: [id number] Date of Birth: 04-16-1981

## 2021-09-09 ENCOUNTER — Encounter: Payer: 59 | Admitting: Physical Therapy

## 2021-09-09 ENCOUNTER — Ambulatory Visit (INDEPENDENT_AMBULATORY_CARE_PROVIDER_SITE_OTHER): Payer: 59 | Admitting: Plastic Surgery

## 2021-09-09 ENCOUNTER — Other Ambulatory Visit: Payer: Self-pay

## 2021-09-09 ENCOUNTER — Encounter: Payer: Self-pay | Admitting: Plastic Surgery

## 2021-09-09 VITALS — BP 125/84 | HR 75 | Ht 59.0 in | Wt 209.4 lb

## 2021-09-09 DIAGNOSIS — K429 Umbilical hernia without obstruction or gangrene: Secondary | ICD-10-CM

## 2021-09-09 DIAGNOSIS — R109 Unspecified abdominal pain: Secondary | ICD-10-CM | POA: Diagnosis not present

## 2021-09-09 DIAGNOSIS — M6208 Separation of muscle (nontraumatic), other site: Secondary | ICD-10-CM | POA: Diagnosis not present

## 2021-09-09 NOTE — Progress Notes (Signed)
Referring Provider Arnette Felts, FNP [address] [place],  [place] [address]   CC:  Lower abdominal pain, abdominal pannus.  Deborah Jacobson is an 40 y.o. female.  HPI:  Patient is a 40 year old who has a history of 4 C-sections.  She has not had a tubal ligation.  She does not have diagnoses of diabetes but notes that she had gestational diabetes in the past and she has been told that she is prediabetic.  She is having some abdominal pain that starts in her right lower quadrant and radiates to her back.  She has had a CT and August 2022 for evaluation of her abdominal pain.  Referred for evaluation of her abdominal pannus.  She reports having relatively normal bowel movements.  Abdomen:  Allergies  Allergen Reactions   Latex Hives    Powder from gloves    Outpatient Encounter Medications as of 09/09/2021  Medication Sig   albuterol (VENTOLIN HFA) 108 (90 Base) MCG/ACT inhaler INHALE 2 PUFFS INTO THE LUNGS EVERY 6 HOURS AS NEEDED FOR WHEEZING OR SHORTNESS OF BREATH   diclofenac Sodium (VOLTAREN) 1 % GEL Apply 2 g topically 4 (four) times daily.   hydrochlorothiazide (HYDRODIURIL) 12.5 MG tablet TAKE 1 TABLET(12.5 MG) BY MOUTH DAILY   hydrOXYzine (ATARAX/VISTARIL) 25 MG tablet Take 1 tablet (25 mg total) by mouth 3 (three) times daily as needed.   meloxicam (MOBIC) 15 MG tablet Take 0.5-1 tablets (7.5-15 mg total) by mouth daily as needed for pain.   OZEMPIC, 1 MG/DOSE, 4 MG/3ML SOPN INJECT 1MG  UNDER THE SKIN ONCE A WEEK   traMADol (ULTRAM) 50 MG tablet Take 1 tablet (50 mg total) by mouth 2 (two) times daily as needed.   traZODone (DESYREL) 50 MG tablet Take 1 tablet (50 mg total) by mouth at bedtime as needed for sleep.   [DISCONTINUED] cetirizine (ZYRTEC) 10 MG tablet Take 10 mg by mouth daily.   No facility-administered encounter medications on file as of 09/09/2021.     Past Medical History:  Diagnosis Date   Acid reflux    Diabetes in pregnancy     Hypertension    Seasonal allergies     Past Surgical History:  Procedure Laterality Date   CESAREAN SECTION     SHOULDER ARTHROSCOPY WITH BICEPS TENDON REPAIR Left 08/29/2020   Procedure: LEFT SHOULDER ARTHROSCOPY WITH BICEPS TENODESIS;  Surgeon: 08/31/2020, MD;  Location: [facility];  Service: Orthopedics;  Laterality: Left;   TUBAL LIGATION      Family History  Problem Relation Age of Onset   Diabetes Mother    Liver disease Mother    Hypertension Mother     Social History   Social History Narrative   Not on file     Review of Systems General: Denies fevers, chills, weight loss CV: Denies chest pain, shortness of breath, palpitations   Physical Exam Vitals with BMI 09/09/2021 08/12/2021 07/04/2021  Height 4\' 11"  4\' 9"  4\' 9"   Weight 209 lbs 6 oz 201 lbs 3 oz 196 lbs 6 oz  BMI 42.27 43.53 42.49  Systolic 125 129 07/06/2021  Diastolic 84 84 66  Pulse 75 83 78    General:  No acute distress,  Alert and oriented, Non-Toxic, Normal speech and affect Abdomen: She has significant abdominal pannus.  The pannus hangs below her pubis.  She does have some adipose subcutaneous as well as some intra-abdominal fullness.  She has well-healed scar.  Umbilical hernia difficult to  palpate.   Imaging: I can appreciate the small fat-containing umbilical hernia and mild rectus diastases.  Assessment/Plan 1.  The patient has a lower abdominal pannus.  I think she would be a candidate for abdominal panniculectomy although it would be preferred that she could lose some weight prior to the procedure.  Right now she is not sure if she is interested in abdominal panniculectomy.  I do think that panniculectomy is unlikely to improve her pain.  2.  Small umbilical hernia.  There is a possible cause of her pain but I think there is also a good chance that it is unrelated.  She is interested in a general surgery referral for their opinion about umbilical hernia repair.  3.  Mild lower  abdominal diastases.  I think that repairing her diastasis is not likely to improve her pain.  Full diastasis repair could be considered with abdominoplasty but I would like her to lose some weight if we were going to    [employer] 09/09/2021, 9:41 AM   Time based coding: 32 minutes were spent with the patient.  Greater than 50% was spent on counseling cordination of care.

## 2021-09-11 ENCOUNTER — Ambulatory Visit
Admission: RE | Admit: 2021-09-11 | Discharge: 2021-09-11 | Disposition: A | Discharge status: Home / Self Care | Payer: 59 | Source: Ambulatory Visit | Attending: Obstetrics and Gynecology | Admitting: Obstetrics and Gynecology

## 2021-09-11 ENCOUNTER — Other Ambulatory Visit: Payer: Self-pay

## 2021-09-11 DIAGNOSIS — Z01419 Encounter for gynecological examination (general) (routine) without abnormal findings: Secondary | ICD-10-CM

## 2021-11-06 ENCOUNTER — Encounter: Payer: Self-pay | Admitting: Nurse Practitioner

## 2021-11-06 ENCOUNTER — Ambulatory Visit (INDEPENDENT_AMBULATORY_CARE_PROVIDER_SITE_OTHER): Payer: 59 | Admitting: Nurse Practitioner

## 2021-11-06 ENCOUNTER — Other Ambulatory Visit: Payer: Self-pay

## 2021-11-06 VITALS — BP 130/84 | HR 86 | Temp 98.6°F | Ht 59.0 in | Wt 203.2 lb

## 2021-11-06 DIAGNOSIS — Z0000 Encounter for general adult medical examination without abnormal findings: Secondary | ICD-10-CM | POA: Diagnosis not present

## 2021-11-06 DIAGNOSIS — R7303 Prediabetes: Secondary | ICD-10-CM | POA: Diagnosis not present

## 2021-11-06 DIAGNOSIS — M62830 Muscle spasm of back: Secondary | ICD-10-CM

## 2021-11-06 DIAGNOSIS — Z79899 Other long term (current) drug therapy: Secondary | ICD-10-CM

## 2021-11-06 DIAGNOSIS — Z23 Encounter for immunization: Secondary | ICD-10-CM

## 2021-11-06 DIAGNOSIS — Z6841 Body Mass Index (BMI) 40.0 and over, adult: Secondary | ICD-10-CM

## 2021-11-06 NOTE — Patient Instructions (Signed)

## 2021-11-06 NOTE — Progress Notes (Signed)
I,Yamilka J Llittleton,acting as a Education administrator for [employer], FNP.,have documented all relevant documentation on the behalf of Minette Brine, FNP,as directed by  Minette Brine, FNP while in the presence of Minette Brine, [place].   This visit occurred during the SARS-CoV-2 public health emergency.  Safety protocols were in place, including screening questions prior to the visit, additional usage of staff PPE, and extensive cleaning of exam room while observing appropriate contact time as indicated for disinfecting solutions.  Subjective:     Patient ID: Deborah Jacobson , female    DOB: 29-Jul-1981 , 40 y.o.   MRN: [id number]   Chief Complaint  Patient presents with   Annual Exam    HPI  Here for hm. She does have a GYN provider but does not remember the providers name.  Wt Readings from Last 3 Encounters: 11/06/21 : 203 lb 3.2 oz (92.2 kg) 09/09/21 : 209 lb 6.4 oz (95 kg) 08/12/21 : 201 lb 3.2 oz (91.3 kg)      Past Medical History:  Diagnosis Date   Acid reflux    Diabetes in pregnancy    Hypertension    Seasonal allergies      Family History  Problem Relation Age of Onset   Diabetes Mother    Liver disease Mother    Hypertension Mother      Current Outpatient Medications:    albuterol (VENTOLIN HFA) 108 (90 Base) MCG/ACT inhaler, INHALE 2 PUFFS INTO THE LUNGS EVERY 6 HOURS AS NEEDED FOR WHEEZING OR SHORTNESS OF BREATH, Disp: 6.7 g, Rfl: 1   hydrochlorothiazide (HYDRODIURIL) 12.5 MG tablet, TAKE 1 TABLET(12.5 MG) BY MOUTH DAILY, Disp: 90 tablet, Rfl: 1   hydrOXYzine (ATARAX/VISTARIL) 25 MG tablet, Take 1 tablet (25 mg total) by mouth 3 (three) times daily as needed., Disp: 30 tablet, Rfl: 2   meloxicam (MOBIC) 15 MG tablet, Take 0.5-1 tablets (7.5-15 mg total) by mouth daily as needed for pain., Disp: 30 tablet, Rfl: 6   OZEMPIC, 1 MG/DOSE, 4 MG/3ML SOPN, INJECT 1MG UNDER THE SKIN ONCE A WEEK, Disp: 9 mL, Rfl: 1   traMADol (ULTRAM) 50 MG tablet, Take 1 tablet (50 mg total) by  mouth 2 (two) times daily as needed., Disp: 30 tablet, Rfl: 0   traZODone (DESYREL) 50 MG tablet, Take 1 tablet (50 mg total) by mouth at bedtime as needed for sleep., Disp: 30 tablet, Rfl: 0   diclofenac Sodium (VOLTAREN) 1 % GEL, APPLY 2 GRAMS TOPICALLY TO THE AFFECTED AREA FOUR TIMES DAILY, Disp: 100 g, Rfl: 2   Allergies  Allergen Reactions   Latex Hives    Powder from gloves      The patient states she is tubal ligation.  No LMP recorded. (Menstrual status: Irregular Periods).. Negative for Dysmenorrhea and Negative for Menorrhagia. Negative for: breast discharge, breast lump(s), breast pain and breast self exam. Associated symptoms include abnormal vaginal bleeding. Pertinent negatives include abnormal bleeding (hematology), anxiety, decreased libido, depression, difficulty falling sleep, dyspareunia, history of infertility, nocturia, sexual dysfunction, sleep disturbances, urinary incontinence, urinary urgency, vaginal discharge and vaginal itching. Diet regular.  She states she is not eating a lot, but knows she needs to eat more vegetables. Limits juice and bread. The patient states her exercise level is minimal - going to the gym weekly, she is working at [employer].   The patient's tobacco use is:  Social History   Tobacco Use  Smoking Status Former  Smokeless Tobacco Never   She has been  exposed to passive smoke. The patient's alcohol use is:  Social History   Substance and Sexual Activity  Alcohol Use No   Additional information: Last pap 08/12/2021, next one scheduled for 08/12/2024.    Review of Systems  Constitutional: Negative.   HENT: Negative.    Eyes: Negative.   Respiratory: Negative.    Cardiovascular: Negative.   Gastrointestinal: Negative.   Endocrine: Negative.   Genitourinary: Negative.   Musculoskeletal: Negative.        Continues to have back spasms. She is planning to reach out to Dr. Sherrian Divers for pain medication. She is using biofreeze and heating  pad for her shoulder.  Skin: Negative.   Allergic/Immunologic: Negative.   Neurological: Negative.   Hematological: Negative.   Psychiatric/Behavioral: Negative.      Today's Vitals   11/06/21 1528  BP: 130/84  Pulse: 86  Temp: 98.6 F (37 C)  Weight: 203 lb 3.2 oz (92.2 kg)  Height: '4\' 11"'  (1.499 m)  PainSc: 0-No pain   Body mass index is 41.04 kg/m.   Objective:  Physical Exam Vitals reviewed.  Constitutional:      General: She is not in acute distress.    Appearance: Normal appearance. She is well-developed. She is obese.  HENT:     Head: Normocephalic and atraumatic.     Right Ear: Hearing, tympanic membrane, ear canal and external ear normal. There is no impacted cerumen.     Left Ear: Hearing, tympanic membrane, ear canal and external ear normal. There is no impacted cerumen.     Nose:     Comments: Deferred - masked    Mouth/Throat:     Comments: Deferred - masked Eyes:     General: Lids are normal.     Extraocular Movements: Extraocular movements intact.     Conjunctiva/sclera: Conjunctivae normal.     Pupils: Pupils are equal, round, and reactive to light.     Funduscopic exam:    Right eye: No papilledema.        Left eye: No papilledema.  Neck:     Thyroid: No thyroid mass.     Vascular: No carotid bruit.  Cardiovascular:     Rate and Rhythm: Normal rate and regular rhythm.     Pulses: Normal pulses.     Heart sounds: Normal heart sounds. No murmur heard. Pulmonary:     Effort: Pulmonary effort is normal.     Breath sounds: Normal breath sounds.  Chest:     Chest wall: No mass.  Breasts:    Tanner Score is 5.     Right: Normal. No mass or tenderness.     Left: Normal. No mass or tenderness.  Abdominal:     General: Abdomen is flat. Bowel sounds are normal. There is no distension.     Palpations: Abdomen is soft.     Tenderness: There is no abdominal tenderness.  Genitourinary:    Rectum: Guaiac result negative.  Musculoskeletal:         General: No swelling or tenderness. Normal range of motion.     Cervical back: Full passive range of motion without pain, normal range of motion and neck supple.     Right lower leg: No edema.     Left lower leg: No edema.  Lymphadenopathy:     Upper Body:     Right upper body: No supraclavicular, axillary or pectoral adenopathy.     Left upper body: No supraclavicular, axillary or pectoral adenopathy.  Skin:  General: Skin is warm and dry.     Capillary Refill: Capillary refill takes less than 2 seconds.  Neurological:     General: No focal deficit present.     Mental Status: She is alert and oriented to person, place, and time.     Cranial Nerves: No cranial nerve deficit.     Sensory: No sensory deficit.  Psychiatric:        Mood and Affect: Mood normal.        Behavior: Behavior normal.        Thought Content: Thought content normal.        Judgment: Judgment normal.        Assessment And Plan:     1. Encounter for general adult medical examination w/o abnormal findings Behavior modifications discussed and diet history reviewed.   Pt will continue to exercise regularly and modify diet with low GI, plant based foods and decrease intake of processed foods.  Recommend intake of daily multivitamin, Vitamin D, and calcium.  Recommend mammogram for preventive screenings, as well as recommend immunizations that include influenza, TDAP  2. Prediabetes Comments: Stable, continue current medications. Tolerating Ozempic well.  - CMP14+EGFR - Lipid panel - Hemoglobin A1c  3. Muscle spasm of back Encouraged to stretch regularly and can use muscle relaxer.   4. Class 3 severe obesity due to excess calories without serious comorbidity with body mass index (BMI) of 40.0 to 44.9 in adult [facility]) Chronic Discussed healthy diet and regular exercise options  Encouraged to exercise at least 150 minutes per week with 2 days of strength training  5. Immunization due - Pneumococcal  polysaccharide vaccine 23-valent greater than or equal to 2yo subcutaneous/IM  6. Other long term (current) drug therapy - CBC     Patient was given opportunity to ask questions. Patient verbalized understanding of the plan and was able to repeat key elements of the plan. All questions were answered to their satisfaction.   Minette Brine, FNP   I, Minette Brine, FNP, have reviewed all documentation for this visit. The documentation on 11/06/21 for the exam, diagnosis, procedures, and orders are all accurate and complete.   THE PATIENT IS ENCOURAGED TO PRACTICE SOCIAL DISTANCING DUE TO THE COVID-19 PANDEMIC.

## 2021-11-07 LAB — LIPID PANEL
Chol/HDL Ratio: 2.9 ratio (ref 0.0–4.4)
Cholesterol, Total: 171 mg/dL (ref 100–199)
HDL: 59 mg/dL (ref >39)
LDL Chol Calc (NIH): 101 mg/dL — ABNORMAL HIGH (ref 0–99)
Triglycerides: 53 mg/dL (ref 0–149)
VLDL Cholesterol Cal: 11 mg/dL (ref 5–40)

## 2021-11-07 LAB — CBC
Hematocrit: 37.8 % (ref 34.0–46.6)
Hemoglobin: 12.2 g/dL (ref 11.1–15.9)
MCH: 26.8 pg (ref 26.6–33.0)
MCHC: 32.3 g/dL (ref 31.5–35.7)
MCV: 83 fL (ref 79–97)
Platelets: 320 10*3/uL (ref 150–450)
RBC: 4.55 x10E6/uL (ref 3.77–5.28)
RDW: 14.3 % (ref 11.7–15.4)
WBC: 5.2 10*3/uL (ref 3.4–10.8)

## 2021-11-07 LAB — CMP14+EGFR
ALT: 10 IU/L (ref 0–32)
AST: 13 IU/L (ref 0–40)
Albumin/Globulin Ratio: 1.5 (ref 1.2–2.2)
Albumin: 4.4 g/dL (ref 3.8–4.8)
Alkaline Phosphatase: 84 IU/L (ref 44–121)
BUN/Creatinine Ratio: 15 (ref 9–23)
BUN: 10 mg/dL (ref 6–24)
Bilirubin Total: <0.2 mg/dL (ref 0.0–1.2)
CO2: 27 mmol/L (ref 20–29)
Calcium: 9.4 mg/dL (ref 8.7–10.2)
Chloride: 99 mmol/L (ref 96–106)
Creatinine, Ser: 0.67 mg/dL (ref 0.57–1.00)
Globulin, Total: 3 g/dL (ref 1.5–4.5)
Glucose: 77 mg/dL (ref 70–99)
Potassium: 3.8 mmol/L (ref 3.5–5.2)
Sodium: 140 mmol/L (ref 134–144)
Total Protein: 7.4 g/dL (ref 6.0–8.5)
eGFR: 113 mL/min/{1.73_m2} (ref >59)

## 2021-11-07 LAB — HEMOGLOBIN A1C
Est. average glucose Bld gHb Est-mCnc: 117 mg/dL
Hgb A1c MFr Bld: 5.7 % — ABNORMAL HIGH (ref 4.8–5.6)

## 2021-11-28 ENCOUNTER — Other Ambulatory Visit: Payer: Self-pay | Admitting: Nurse Practitioner

## 2021-11-28 ENCOUNTER — Other Ambulatory Visit: Payer: Self-pay | Admitting: Physician Assistant

## 2021-11-28 DIAGNOSIS — G8929 Other chronic pain: Secondary | ICD-10-CM

## 2022-01-19 ENCOUNTER — Other Ambulatory Visit: Payer: Self-pay | Admitting: Nurse Practitioner

## 2022-01-19 DIAGNOSIS — R7303 Prediabetes: Secondary | ICD-10-CM

## 2022-01-25 ENCOUNTER — Encounter: Payer: Self-pay | Admitting: Nurse Practitioner

## 2022-01-27 ENCOUNTER — Telehealth: Payer: Self-pay

## 2022-01-27 NOTE — Telephone Encounter (Signed)
I left the pt a message that the office was calling to schedule her an appt for evaluation of leg cramps. ?

## 2022-02-04 ENCOUNTER — Other Ambulatory Visit: Payer: Self-pay | Admitting: Physician Assistant

## 2022-02-04 MED ORDER — TRAMADOL HCL 50 MG PO TABS: 50.0000 mg | ORAL_TABLET | Freq: Two times a day (BID) | ORAL | 0 refills | Status: DC | PRN

## 2022-02-04 NOTE — Telephone Encounter (Signed)
Sent in

## 2022-02-18 ENCOUNTER — Telehealth: Payer: Self-pay

## 2022-02-18 NOTE — Telephone Encounter (Signed)
Left vm for pt to return call to schedule an appt

## 2022-03-13 ENCOUNTER — Encounter: Payer: Self-pay | Admitting: Nurse Practitioner

## 2022-03-13 ENCOUNTER — Ambulatory Visit (INDEPENDENT_AMBULATORY_CARE_PROVIDER_SITE_OTHER): Payer: 59 | Admitting: Nurse Practitioner

## 2022-03-13 VITALS — BP 124/66 | HR 73 | Temp 98.3°F | Ht 59.0 in | Wt 206.0 lb

## 2022-03-13 DIAGNOSIS — R252 Cramp and spasm: Secondary | ICD-10-CM

## 2022-03-13 DIAGNOSIS — G4700 Insomnia, unspecified: Secondary | ICD-10-CM | POA: Diagnosis not present

## 2022-03-13 DIAGNOSIS — Z6841 Body Mass Index (BMI) 40.0 and over, adult: Secondary | ICD-10-CM

## 2022-03-13 DIAGNOSIS — R7303 Prediabetes: Secondary | ICD-10-CM | POA: Diagnosis not present

## 2022-03-13 MED ORDER — ALBUTEROL SULFATE HFA 108 (90 BASE) MCG/ACT IN AERS: 2.0000 | INHALATION_SPRAY | Freq: Four times a day (QID) | RESPIRATORY_TRACT | 1 refills | Status: AC | PRN

## 2022-03-13 NOTE — Patient Instructions (Signed)
Preventing Type 2 Diabetes Mellitus Type 2 diabetes, also called type 2 diabetes mellitus, is a long-term (chronic) disease that affects sugar (glucose) levels in your blood. Normally, a hormone called insulin allows glucose to enter cells in your body. The cells use glucose for energy. With type 2 diabetes, you will have one or both of these problems: Your pancreas does not make enough insulin. Cells in your body do not respond properly to insulin that your body makes (insulin resistance). Insulin resistance or lack of insulin causes extra glucose to build up in the blood instead of going into cells. As a result, high blood glucose (hyperglycemia) develops. That can cause many complications. Being overweight or obese and having an inactive (sedentary) lifestyle can increase your risk for diabetes. Type 2 diabetes can be delayed or prevented by making certain nutrition and lifestyle changes. How can this condition affect me? If you do not take steps to prevent diabetes, your blood glucose levels may keep increasing over time. Too much glucose in your blood for a long time can damage your blood vessels, heart, kidneys, nerves, and eyes. Type 2 diabetes can lead to chronic health problems and complications, such as: Heart disease. Stroke. Blindness. Kidney disease. Depression. Poor circulation in your feet and legs. In severe cases, a foot or leg may need to be surgically removed (amputated). What can increase my risk? You may be more likely to develop type 2 diabetes if you: Have type 2 diabetes in your family. Are overweight or obese. Have a sedentary lifestyle. Have insulin resistance or a history of prediabetes. Have a history of pregnancy-related (gestational) diabetes or polycystic ovary syndrome (PCOS). What actions can I take to prevent this? It can be difficult to recognize signs of type 2 diabetes. Taking action to prevent the disease before you develop symptoms is the best way to avoid  possible damage to your body. Making certain nutrition and lifestyle changes may prevent or delay the disease and related health problems. Nutrition  Eat healthy meals and snacks regularly. Do not skip meals. Fruit or a handful of nuts is a healthy snack between meals. Drink water throughout the day. Avoid drinks that contain added sugar, such as soda or sweetened tea. Drink enough fluid to keep your urine pale yellow. Follow instructions from your health care provider about eating or drinking restrictions. Limit the amount of food you eat by: Managing how much you eat at a time (portion size). Checking food labels for the serving sizes of food. Using a kitchen scale to weigh amounts of food. Saut or steam food instead of frying it. Cook with water or broth instead of oils or butter. Limit saturated fat and salt (sodium) in your diet. Have no more than 1 tsp (2,400 mg) of sodium a day. If you have heart disease or high blood pressure, use less than ? tsp (1,500 mg) of sodium a day. Lifestyle  Lose weight if needed and as told. Your health care provider can determine how much weight loss is best for you and can help you lose weight safely. If you are overweight or obese, you may be told to lose at least 5?7% of your body weight. Manage blood pressure, cholesterol, and stress. Your health care provider will help determine the best treatment for you. Do not use any products that contain nicotine or tobacco. These products include cigarettes, chewing tobacco, and vaping devices, such as e-cigarettes. If you need help quitting, ask your health care provider. Activity  Do physical   activity that makes your heart beat faster and makes you sweat (moderate intensity). Do this for at least 30 minutes on at least 5 days of the week, or as much as told by your health care provider. Ask your health care provider what activities are safe for you. A mix of activities may be best, such as walking, swimming,  cycling, and strength training. Try to add physical activity into your day. For example: Park your car farther away than usual so that you walk more. Take a walk during your lunch break. Use stairs instead of elevators or escalators. Walk or bike to work instead of driving. Alcohol use If you drink alcohol: Limit how much you have to: 0?1 drink a day for women who are not pregnant. 0?2 drinks a day for men. Know how much alcohol is in your drink. In the [place] one drink equals one 12 oz bottle of beer (355 mL), one 5 oz glass of wine (148 mL), or one 1 oz glass of hard liquor (44 mL). General information Talk with your health care provider about your risk factors and how you can reduce your risk for diabetes. Have your blood glucose tested regularly, as told by your health care provider. Get screening tests as told by your health care provider. You may have these regularly, especially if you have certain risk factors for type 2 diabetes. Make an appointment with a registered dietitian. This diet and nutrition specialist can help you make a healthy eating plan and help you understand portion sizes and food labels. Where to find support Ask your health care provider to recommend a registered dietitian, a certified diabetes care and education specialist, or a weight loss program. Look for local or online weight loss groups. Join a gym, fitness club, or outdoor activity group, such as a walking club. Where to find more information For help and guidance and to learn more about diabetes and diabetes prevention, visit: American Diabetes Association (ADA): www.diabetes.org National Institute of Diabetes and Digestive and Kidney Diseases: www.niddk.nih.gov To learn more about healthy eating, visit: U.S. Department of Agriculture (USDA): www.choosemyplate.gov Office of Disease Prevention and Health Promotion (ODPHP): health.gov Summary You can delay or prevent type 2 diabetes by eating healthy  foods, losing weight if needed, and increasing your physical activity. Talk with your health care provider about your risk factors for type 2 diabetes and how you can reduce your risk. It can be difficult to recognize the signs of type 2 diabetes. The best way to avoid possible damage to your body is to take action to prevent the disease before you develop symptoms. Get screening tests as told by your health care provider. This information is not intended to replace advice given to you by your health care provider. Make sure you discuss any questions you have with your health care provider. Document Revised: 01/21/2021 Document Reviewed: 01/21/2021 Elsevier Patient Education  2023 Elsevier Inc.  

## 2022-03-13 NOTE — Progress Notes (Signed)
?Clinical biochemist as a Neurosurgeon for [employer], FNP.,have documented all relevant documentation on the behalf of Arnette Felts, FNP,as directed by  Arnette Felts, FNP while in the presence of Arnette Felts, FNP. ? ?This visit occurred during the SARS-CoV-2 public health emergency.  Safety protocols were in place, including screening questions prior to the visit, additional usage of staff PPE, and extensive cleaning of exam room while observing appropriate contact time as indicated for disinfecting solutions. ? ?Subjective:  ?  ? Patient ID: Deborah Jacobson , female    DOB: May 25, 1981 , 41 y.o.   MRN: [id number] ? ? ?Chief Complaint  ?Patient presents with  ? Prediabetes  ? ? ?HPI ? ?Patient presents today for prediabetes follow up.  She has been without Ozempic for 2 weeks.  She is not eating as much due to being more busy with life. Her daughter just had a baby and she is helping with that. She is also working 2nd shift. She will be going back to 3rd shift starting tomorrow.  ? ?Wt Readings from Last 3 Encounters: ?03/13/22 : 206 lb (93.4 kg) ?11/06/21 : 203 lb 3.2 oz (92.2 kg) ?09/09/21 : 209 lb 6.4 oz (95 kg) ? ? ?  ? ?Past Medical History:  ?Diagnosis Date  ? Acid reflux   ? Diabetes in pregnancy   ? Hypertension   ? Seasonal allergies   ?  ? ?Family History  ?Problem Relation Age of Onset  ? Diabetes Mother   ? Liver disease Mother   ? Hypertension Mother   ? ? ? ?Current Outpatient Medications:  ?  diclofenac Sodium (VOLTAREN) 1 % GEL, APPLY 2 GRAMS TOPICALLY TO THE AFFECTED AREA FOUR TIMES DAILY, Disp: 100 g, Rfl: 2 ?  hydrochlorothiazide (HYDRODIURIL) 12.5 MG tablet, TAKE 1 TABLET(12.5 MG) BY MOUTH DAILY, Disp: 90 tablet, Rfl: 1 ?  meloxicam (MOBIC) 15 MG tablet, Take 0.5-1 tablets (7.5-15 mg total) by mouth daily as needed for pain., Disp: 30 tablet, Rfl: 6 ?  traMADol (ULTRAM) 50 MG tablet, Take 1 tablet (50 mg total) by mouth 2 (two) times daily as needed., Disp: 30 tablet, Rfl: 0 ?  traZODone  (DESYREL) 50 MG tablet, Take 1 tablet (50 mg total) by mouth at bedtime as needed for sleep., Disp: 30 tablet, Rfl: 0 ?  albuterol (VENTOLIN HFA) 108 (90 Base) MCG/ACT inhaler, Inhale 2 puffs into the lungs every 6 (six) hours as needed for wheezing or shortness of breath., Disp: 6.7 g, Rfl: 1  ? ?Allergies  ?Allergen Reactions  ? Latex Hives  ?  Powder from gloves  ?  ? ?Review of Systems  ?Constitutional: Negative.   ?Respiratory: Negative.    ?Cardiovascular: Negative.   ?Gastrointestinal: Negative.   ?Musculoskeletal:  Positive for myalgias.  ?Neurological: Negative.   ?Psychiatric/Behavioral: Negative.     ? ?Today's Vitals  ? 03/13/22 1151  ?BP: 124/66  ?Pulse: 73  ?Temp: 98.3 ?F (36.8 ?C)  ?TempSrc: Oral  ?Weight: 206 lb (93.4 kg)  ?Height: 4\' 11"  (1.499 m)  ? ?Body mass index is 41.61 kg/m?.  ?Wt Readings from Last 3 Encounters:  ?03/13/22 206 lb (93.4 kg)  ?11/06/21 203 lb 3.2 oz (92.2 kg)  ?09/09/21 209 lb 6.4 oz (95 kg)  ? ? ?Objective:  ?Physical Exam ?Constitutional:   ?   General: She is not in acute distress. ?   Appearance: Normal appearance. She is well-developed. She is obese.  ?Cardiovascular:  ?   Rate and Rhythm: Normal rate and  regular rhythm.  ?   Pulses: Normal pulses.  ?   Heart sounds: Normal heart sounds. No murmur heard. ?Pulmonary:  ?   Effort: Pulmonary effort is normal. No respiratory distress.  ?   Breath sounds: Normal breath sounds. No wheezing.  ?Chest:  ?   Chest wall: No tenderness.  ?Musculoskeletal:     ?   General: No swelling or tenderness. Normal range of motion.  ?Skin: ?   General: Skin is warm and dry.  ?   Capillary Refill: Capillary refill takes less than 2 seconds.  ?Neurological:  ?   General: No focal deficit present.  ?   Mental Status: She is alert and oriented to person, place, and time.  ?   Cranial Nerves: No cranial nerve deficit.  ?   Motor: No weakness.  ?Psychiatric:     ?   Mood and Affect: Mood normal.     ?   Behavior: Behavior normal.     ?   Thought  Content: Thought content normal.     ?   Judgment: Judgment normal.  ?  ? ?   ?Assessment And Plan:  ?   ?1. Prediabetes ?Comments: She has been without Ozempic for 2 weeks. Will recheck HgbA1c and determine if she needs to continue medications ?- Hemoglobin A1c ? ?2. Insomnia, unspecified type ?Comments: May be related to her current shift, will see how she does after going back on 3rd shift. Also can take a 2nd trazadone if the 1st one is not effective  ? ?3. Muscle cramps ?Comments: Will check Magnesium and encouraged to increase water intake and to stretch regularly ?- Magnesium ? ?4. Class 3 severe obesity due to excess calories without serious comorbidity with body mass index (BMI) of 40.0 to 44.9 in adult [facility]) ?She is encouraged to strive for BMI less than 30 to decrease cardiac risk. Advised to aim for at least 150 minutes of exercise per week. ? ? ? ?Patient was given opportunity to ask questions. Patient verbalized understanding of the plan and was able to repeat key elements of the plan. All questions were answered to their satisfaction.  ?Arnette Felts, FNP  ? ?I, Arnette Felts, FNP, have reviewed all documentation for this visit. The documentation on 03/13/22 for the exam, diagnosis, procedures, and orders are all accurate and complete.  ? ?IF YOU HAVE BEEN REFERRED TO A SPECIALIST, IT MAY TAKE 1-2 WEEKS TO SCHEDULE/PROCESS THE REFERRAL. IF YOU HAVE NOT HEARD FROM US/SPECIALIST IN TWO WEEKS, PLEASE GIVE [place] A CALL AT [phone] X [phone].  ? ?THE PATIENT IS ENCOURAGED TO PRACTICE SOCIAL DISTANCING DUE TO THE COVID-19 PANDEMIC.   ?

## 2022-03-14 LAB — MAGNESIUM: Magnesium: 2.1 mg/dL (ref 1.6–2.3)

## 2022-03-14 LAB — HEMOGLOBIN A1C
Est. average glucose Bld gHb Est-mCnc: 117 mg/dL
Hgb A1c MFr Bld: 5.7 % — ABNORMAL HIGH (ref 4.8–5.6)

## 2022-03-21 ENCOUNTER — Encounter: Payer: Self-pay | Admitting: Nurse Practitioner

## 2022-03-24 ENCOUNTER — Ambulatory Visit: Payer: 59 | Admitting: Podiatrist

## 2022-03-24 DIAGNOSIS — M722 Plantar fascial fibromatosis: Secondary | ICD-10-CM | POA: Diagnosis not present

## 2022-03-24 MED ORDER — METHYLPREDNISOLONE 4 MG PO TBPK: ORAL_TABLET | ORAL | 1 refills | Status: DC

## 2022-03-24 NOTE — Patient Instructions (Signed)

## 2022-03-24 NOTE — Progress Notes (Signed)
Chief Complaint  Patient presents with   Plantar Fasciitis     HPI: Patient is 41 y.o. female who presents today for pain on the ball and heel of both feet. She relates she has a history of plantar fasciitis and has seen Dr. Charlsie Merles for injections.  She relates pain with first step in the morning or after sitting for long periods.  She relates her heels and feet are sore to the touch and standing is painful. She works as a Clinical biochemist at [employer] and is on concrete floors at work.   Patient Active Problem List   Diagnosis Date Noted   Subacromial bursitis of left shoulder joint 08/29/2020   Tendinopathy of left rotator cuff 08/29/2020   Morbid obesity with BMI of 45.0-49.9, adult (HCC) 08/09/2020   Superior glenoid labrum lesion of left shoulder 07/10/2020   Health maintenance examination 03/23/2019   Prediabetes 12/20/18   Strain of back 12-20-2018   Death of family member December 20, 2018   Back pain 07/31/2018   Chronic low back pain 11/30/2017   Shifting sleep-work schedule 06/15/2017   Paradoxical insomnia 06/15/2017   Sleep related headaches 06/15/2017   Super obese 06/15/2017   Mood complaints in sleep disorder 06/15/2017   Plantar fasciitis of left foot 03/01/2014   Porokeratosis 03/01/2014   Pain in lower limb 03/01/2014    Current Outpatient Medications on File Prior to Visit  Medication Sig Dispense Refill   albuterol (VENTOLIN HFA) 108 (90 Base) MCG/ACT inhaler Inhale 2 puffs into the lungs every 6 (six) hours as needed for wheezing or shortness of breath. 6.7 g 1   diclofenac Sodium (VOLTAREN) 1 % GEL APPLY 2 GRAMS TOPICALLY TO THE AFFECTED AREA FOUR TIMES DAILY 100 g 2   hydrochlorothiazide (HYDRODIURIL) 12.5 MG tablet TAKE 1 TABLET(12.5 MG) BY MOUTH DAILY 90 tablet 1   meloxicam (MOBIC) 15 MG tablet Take 0.5-1 tablets (7.5-15 mg total) by mouth daily as needed for pain. 30 tablet 6   traMADol (ULTRAM) 50 MG tablet Take 1 tablet (50 mg total) by mouth 2 (two) times daily as  needed. 30 tablet 0   traZODone (DESYREL) 50 MG tablet Take 1 tablet (50 mg total) by mouth at bedtime as needed for sleep. 30 tablet 0   [DISCONTINUED] cetirizine (ZYRTEC) 10 MG tablet Take 10 mg by mouth daily.     No current facility-administered medications on file prior to visit.    Allergies  Allergen Reactions   Latex Hives    Powder from gloves    Review of Systems No fevers, chills, nausea, muscle aches, no difficulty breathing, no calf pain, no chest pain or shortness of breath.   Physical Exam  GENERAL APPEARANCE: Alert, conversant. Appropriately groomed. No acute distress.   VASCULAR: Pedal pulses palpable DP and PT bilateral.  Capillary refill time is immediate to all digits,  Proximal to distal cooling it warm to warm.  Digital perfusion adequate.   NEUROLOGIC: sensation is intact to 5.07 monofilament at 5/5 sites bilateral.  Light touch is intact bilateral, vibratory sensation intact bilateral  MUSCULOSKELETAL:  pain on palpation plantar medial aspect of bilateral feet at the insertion of the plantar fascia.  Pain at the plantar forefoot is also noted submetatarsal 2-4.  DERMATOLOGIC: skin is warm, supple, and dry.  Color, texture, and turgor of skin within normal limits.  No open wounds are noted.  No preulcerative lesions are seen.  Digital nails are asymptomatic.      Assessment  ICD-10-CM   1. Plantar fasciitis  M72.2        Plan  Discussed treatment options and recommendations.  She relates she gets injections every 6-12 months and would like to do something to keep the pain from coming back and requiring injections.  I recommended trying a medrol dose pack to calm down the pain as well as shoe gear changes and orthotic therapy.  I recommended she go to [facility] for shoe and OTC orthotic recommendations. She will return in 3 weeks for follow up and further recommendations  and treatment options.

## 2022-03-28 ENCOUNTER — Encounter: Payer: Self-pay | Admitting: Podiatrist

## 2022-04-16 ENCOUNTER — Encounter: Payer: Self-pay | Admitting: Nurse Practitioner

## 2022-04-16 ENCOUNTER — Other Ambulatory Visit: Payer: Self-pay

## 2022-04-16 MED ORDER — IBUPROFEN 800 MG PO TABS: 800.0000 mg | ORAL_TABLET | Freq: Three times a day (TID) | ORAL | 0 refills | Status: DC | PRN

## 2022-05-19 ENCOUNTER — Encounter: Payer: Self-pay | Admitting: Nurse Practitioner

## 2022-05-22 ENCOUNTER — Encounter: Payer: Self-pay | Admitting: Nurse Practitioner

## 2022-05-22 ENCOUNTER — Ambulatory Visit (INDEPENDENT_AMBULATORY_CARE_PROVIDER_SITE_OTHER): Payer: 59 | Admitting: Nurse Practitioner

## 2022-05-22 VITALS — BP 122/60 | HR 81 | Temp 98.2°F | Ht 59.0 in | Wt 214.8 lb

## 2022-05-22 DIAGNOSIS — R42 Dizziness and giddiness: Secondary | ICD-10-CM

## 2022-05-22 DIAGNOSIS — R232 Flushing: Secondary | ICD-10-CM

## 2022-05-22 DIAGNOSIS — Z6841 Body Mass Index (BMI) 40.0 and over, adult: Secondary | ICD-10-CM

## 2022-05-22 NOTE — Progress Notes (Signed)
Deborah Jacobson,acting as a Neurosurgeon for Arnette Felts, FNP.,have documented all relevant documentation on the behalf of Arnette Felts, FNP,as directed by  Arnette Felts, FNP while in the presence of Arnette Felts, FNP.    Subjective:     Patient ID: Deborah Jacobson , female    DOB: [birth year] , 41 y.o.   MRN: [id number]   Chief Complaint  Patient presents with   Blood Sugar Problem    HPI  Patient states her blood sugar increased to 160 last week, she had a doughnut. She is also having "dizzy" spells, nausea and felt like having motion sickness on Sunday when she had the doughnut. The next day she checked her blood sugar on Monday was 91. She had cut back on coffee but drank it the day before. She does admit to eating poorly with snacks and chips. She is concerned she is going through menopause. LMP - 05/15/22 - regular - she continues to cramp "bad"     Past Medical History:  Diagnosis Date   Acid reflux    Diabetes in pregnancy    Hypertension    Seasonal allergies      Family History  Problem Relation Age of Onset   Diabetes Mother    Liver disease Mother    Hypertension Mother      Current Outpatient Medications:    albuterol (VENTOLIN HFA) 108 (90 Base) MCG/ACT inhaler, Inhale 2 puffs into the lungs every 6 (six) hours as needed for wheezing or shortness of breath., Disp: 6.7 g, Rfl: 1   hydrochlorothiazide (HYDRODIURIL) 12.5 MG tablet, TAKE 1 TABLET(12.5 MG) BY MOUTH DAILY, Disp: 90 tablet, Rfl: 1   ibuprofen (ADVIL) 800 MG tablet, Take 1 tablet (800 mg total) by mouth every 8 (eight) hours as needed., Disp: 30 tablet, Rfl: 0   meloxicam (MOBIC) 15 MG tablet, Take 0.5-1 tablets (7.5-15 mg total) by mouth daily as needed for pain., Disp: 30 tablet, Rfl: 6   methylPREDNISolone (MEDROL DOSEPAK) 4 MG TBPK tablet, Take taper as directed in package instructions, Disp: 21 tablet, Rfl: 1   traMADol (ULTRAM) 50 MG tablet, Take 1 tablet (50 mg total) by mouth 2 (two) times daily as  needed., Disp: 30 tablet, Rfl: 0   traZODone (DESYREL) 50 MG tablet, Take 1 tablet (50 mg total) by mouth at bedtime as needed for sleep., Disp: 30 tablet, Rfl: 0   Allergies  Allergen Reactions   Latex Hives    Powder from gloves     Review of Systems  Constitutional: Negative.   HENT: Negative.    Eyes: Negative.   Respiratory: Negative.    Cardiovascular: Negative.   Gastrointestinal: Negative.   Endocrine: Negative.   Genitourinary: Negative.   Musculoskeletal: Negative.   Skin: Negative.   Allergic/Immunologic: Negative.   Neurological:  Positive for dizziness (having episodes of dizziness, has occurred with).  Hematological: Negative.   Psychiatric/Behavioral: Negative.       Today's Vitals   05/22/22 1517  BP: 122/60  Pulse: 81  Temp: 98.2 F (36.8 C)  TempSrc: Oral  Weight: 214 lb 12.8 oz (97.4 kg)  Height: 4\' 11"  (1.499 m)  PainSc: 0-No pain   Body mass index is 43.38 kg/m.  Wt Readings from Last 3 Encounters:  05/22/22 214 lb 12.8 oz (97.4 kg)  03/13/22 206 lb (93.4 kg)  11/06/21 203 lb 3.2 oz (92.2 kg)      Objective:  Physical Exam Vitals reviewed.  Constitutional:      General:  She is not in acute distress.    Appearance: Normal appearance. She is well-developed. She is obese.  Cardiovascular:     Rate and Rhythm: Normal rate and regular rhythm.     Pulses: Normal pulses.     Heart sounds: Normal heart sounds. No murmur heard. Pulmonary:     Effort: Pulmonary effort is normal. No respiratory distress.     Breath sounds: Normal breath sounds. No wheezing.  Chest:     Chest wall: No tenderness.  Musculoskeletal:        General: No swelling or tenderness. Normal range of motion.  Skin:    General: Skin is warm and dry.     Capillary Refill: Capillary refill takes less than 2 seconds.  Neurological:     General: No focal deficit present.     Mental Status: She is alert and oriented to person, place, and time.     Cranial Nerves: No cranial  nerve deficit.     Motor: No weakness.  Psychiatric:        Mood and Affect: Mood normal.        Behavior: Behavior normal.        Thought Content: Thought content normal.        Judgment: Judgment normal.         Assessment And Plan:     1. Dizziness Comments: Orthostats are normal. No dizziness currently.  Encouraged to stay well hydrated with water and to avoid high sugar content foods and drinks - TSH - Thyroid Peroxidase Antibody - CBC  2. Hot flashes Comments: Low suspicion of menopause, encouraged to limit her intake of sugar this can sometimes increase risk for hot flashes.  - TSH - Thyroid Peroxidase Antibody - CBC  3. Class 3 severe obesity due to excess calories without serious comorbidity with body mass index (BMI) of 40.0 to 44.9 in adult [facility]) She is encouraged to strive for BMI less than 30 to decrease cardiac risk. Advised to aim for at least 150 minutes of exercise per week.    Patient was given opportunity to ask questions. Patient verbalized understanding of the plan and was able to repeat key elements of the plan. All questions were answered to their satisfaction.  Arnette Felts, FNP   I, Arnette Felts, FNP, have reviewed all documentation for this visit. The documentation on 05/22/22 for the exam, diagnosis, procedures, and orders are all accurate and complete.   IF YOU HAVE BEEN REFERRED TO A SPECIALIST, IT MAY TAKE 1-2 WEEKS TO SCHEDULE/PROCESS THE REFERRAL. IF YOU HAVE NOT HEARD FROM US/SPECIALIST IN TWO WEEKS, PLEASE GIVE [place] A CALL AT [phone].   THE PATIENT IS ENCOURAGED TO PRACTICE SOCIAL DISTANCING DUE TO THE COVID-19 PANDEMIC.

## 2022-05-23 LAB — CBC
Hematocrit: 35.6 % (ref 34.0–46.6)
Hemoglobin: 11.2 g/dL (ref 11.1–15.9)
MCH: 26.3 pg — ABNORMAL LOW (ref 26.6–33.0)
MCHC: 31.5 g/dL (ref 31.5–35.7)
MCV: 84 fL (ref 79–97)
Platelets: 295 10*3/uL (ref 150–450)
RBC: 4.26 x10E6/uL (ref 3.77–5.28)
RDW: 14.4 % (ref 11.7–15.4)
WBC: 6.2 10*3/uL (ref 3.4–10.8)

## 2022-05-23 LAB — TSH: TSH: 1.12 u[IU]/mL (ref 0.450–4.500)

## 2022-05-23 LAB — THYROID PEROXIDASE ANTIBODY: Thyroperoxidase Ab SerPl-aCnc: 10 IU/mL (ref 0–34)

## 2022-07-04 ENCOUNTER — Ambulatory Visit (INDEPENDENT_AMBULATORY_CARE_PROVIDER_SITE_OTHER): Payer: 59

## 2022-07-04 ENCOUNTER — Ambulatory Visit (HOSPITAL_COMMUNITY)
Admission: EM | Admit: 2022-07-04 | Discharge: 2022-07-04 | Disposition: A | Discharge status: Home / Self Care | Payer: 59 | Attending: Nurse Practitioner | Admitting: Nurse Practitioner

## 2022-07-04 ENCOUNTER — Encounter (HOSPITAL_COMMUNITY): Payer: Self-pay | Admitting: *Deleted

## 2022-07-04 DIAGNOSIS — M25512 Pain in left shoulder: Secondary | ICD-10-CM

## 2022-07-04 DIAGNOSIS — M62830 Muscle spasm of back: Secondary | ICD-10-CM | POA: Diagnosis not present

## 2022-07-04 DIAGNOSIS — M546 Pain in thoracic spine: Secondary | ICD-10-CM | POA: Diagnosis not present

## 2022-07-04 DIAGNOSIS — M549 Dorsalgia, unspecified: Secondary | ICD-10-CM

## 2022-07-04 MED ORDER — CYCLOBENZAPRINE HCL 5 MG PO TABS: 5.0000 mg | ORAL_TABLET | Freq: Three times a day (TID) | ORAL | 0 refills | Status: AC | PRN

## 2022-07-04 MED ORDER — KETOROLAC TROMETHAMINE 60 MG/2ML IM SOLN
INTRAMUSCULAR | Status: AC
  Filled 2022-07-04: qty 2

## 2022-07-04 MED ORDER — KETOROLAC TROMETHAMINE 60 MG/2ML IM SOLN
60.0000 mg | Freq: Once | INTRAMUSCULAR | Status: AC
  Administered 2022-07-04: 60 mg via INTRAMUSCULAR

## 2022-07-04 NOTE — ED Provider Notes (Signed)
[facility]    CSN: [id number] Arrival date & time: 07/04/22  1133      History   Chief Complaint Chief Complaint  Patient presents with   Back Pain    HPI Deborah Jacobson is a 41 y.o. female.   HPI  Patient presents with complaint of back pain. This is a result of lifting a heavy object. Onset of pain was 1 day ago and has been gradually worsening since. The pain is located in left thoracic spine, described as aching and rated as moderate and localized, with radiation. Symptoms include muscle ache. The patient also complains of worse with specific movements . The patient denies additional symptoms. The patient denies other injuries. Care prior to arrival consisted of NSAID, OTC ointment, and topical analgesic with minimal relief.  Past Medical History:  Diagnosis Date   Acid reflux    Diabetes in pregnancy    Hypertension    Seasonal allergies     Patient Active Problem List   Diagnosis Date Noted   Subacromial bursitis of left shoulder joint 08/29/2020   Tendinopathy of left rotator cuff 08/29/2020   Morbid obesity with BMI of 45.0-49.9, adult (HCC) 08/09/2020   Superior glenoid labrum lesion of left shoulder 07/10/2020   Health maintenance examination 03/23/2019   Prediabetes 12-17-2018   Strain of back Dec 17, 2018   Death of family member Dec 17, 2018   Back pain 07/31/2018   Chronic low back pain 11/30/2017   Shifting sleep-work schedule 06/15/2017   Paradoxical insomnia 06/15/2017   Sleep related headaches 06/15/2017   Super obese 06/15/2017   Mood complaints in sleep disorder 06/15/2017   Plantar fasciitis of left foot 03/01/2014   Porokeratosis 03/01/2014   Pain in lower limb 03/01/2014    Past Surgical History:  Procedure Laterality Date   CESAREAN SECTION     SHOULDER ARTHROSCOPY WITH BICEPS TENDON REPAIR Left 08/29/2020   Procedure: LEFT SHOULDER ARTHROSCOPY WITH BICEPS TENODESIS;  Surgeon: Tarry Kos, MD;  Location: [facility];  Service: Orthopedics;  Laterality: Left;   TUBAL LIGATION      OB History     Gravida  6   Para  4   Term  4   Preterm      AB  2   Living  4      SAB  1   IAB  1   Ectopic      Multiple      Live Births  4            Home Medications    Prior to Admission medications   Medication Sig Start Date End Date Taking? Authorizing Provider  albuterol (VENTOLIN HFA) 108 (90 Base) MCG/ACT inhaler Inhale 2 puffs into the lungs every 6 (six) hours as needed for wheezing or shortness of breath. 03/13/22  Yes Arnette Felts, FNP  cyclobenzaprine (FLEXERIL) 5 MG tablet Take 1 tablet (5 mg total) by mouth 3 (three) times daily as needed for up to 7 days for muscle spasms. 07/04/22 07/11/22 Yes Orian Amberg, Shana Chute, NP  hydrochlorothiazide (HYDRODIURIL) 12.5 MG tablet TAKE 1 TABLET(12.5 MG) BY MOUTH DAILY 01/20/22  Yes Arnette Felts, FNP  ibuprofen (ADVIL) 800 MG tablet Take 1 tablet (800 mg total) by mouth every 8 (eight) hours as needed. 04/16/22  Yes Arnette Felts, FNP  meloxicam (MOBIC) 15 MG tablet Take 0.5-1 tablets (7.5-15 mg total) by mouth daily as needed for pain. 08/05/21  Yes Arnette Felts, FNP  traZODone (DESYREL)  50 MG tablet Take 1 tablet (50 mg total) by mouth at bedtime as needed for sleep. 04/26/21  Yes Ghumman, Ramandeep, NP  cetirizine (ZYRTEC) 10 MG tablet Take 10 mg by mouth daily.  10/13/19  [provider]    Family History Family History  Problem Relation Age of Onset   Diabetes Mother    Liver disease Mother    Hypertension Mother     Social History Social History   Tobacco Use   Smoking status: Former   Smokeless tobacco: Never  Scientific laboratory technician Use: Never used  Substance Use Topics   Alcohol use: No   Drug use: No     Allergies   Latex   Review of Systems Review of Systems   Physical Exam Triage Vital Signs ED Triage Vitals  Enc Vitals Group     BP 07/04/22 1210 129/79     Pulse Rate 07/04/22 1210 79     Resp  07/04/22 1210 18     Temp 07/04/22 1210 98.9 F (37.2 C)     Temp Source 07/04/22 1210 Oral     SpO2 07/04/22 1210 100 %     Weight --      Height --      Head Circumference --      Peak Flow --      Pain Score 07/04/22 1206 8     Pain Loc --      Pain Edu? --      Excl. in [place]? --    No data found.  Updated Vital Signs BP 129/79 (BP Location: Right Arm)   Pulse 79   Temp 98.9 F (37.2 C) (Oral)   Resp 18   LMP  (LMP Unknown)   SpO2 100%   Visual Acuity Right Eye Distance:   Left Eye Distance:   Bilateral Distance:    Right Eye Near:   Left Eye Near:    Bilateral Near:     Physical Exam Constitutional:      Appearance: She is obese.  HENT:     Head: Normocephalic and atraumatic.  Cardiovascular:     Rate and Rhythm: Normal rate.  Pulmonary:     Effort: Pulmonary effort is normal.  Musculoskeletal:     Cervical back: Tenderness present.     Thoracic back: Tenderness present.  Neurological:     Mental Status: She is alert.      UC Treatments / Results  Labs (all labs ordered are listed, but only abnormal results are displayed) Labs Reviewed - No data to display  EKG   Radiology No results found.  Procedures Procedures (including critical care time)  Medications Ordered in UC Medications  ketorolac (TORADOL) injection 60 mg (60 mg Intramuscular Given 07/04/22 1504)    Initial Impression / Assessment and Plan / UC Course  I have reviewed the triage vital signs and the nursing notes.  Pertinent labs & imaging results that were available during my care of the patient were reviewed by me and considered in my medical decision making (see chart for details).    Upper back pain Final Clinical Impressions(s) / UC Diagnoses   Final diagnoses:  Upper back pain  Acute pain of left shoulder  Muscle spasm of back     Discharge Instructions      Xray: Stable cervicothoracic degenerative changes. No acute osseous findings or malalignment No  appreciable thoracic vertebral compression fracture. Thoracic spondylosis, as described. Mild dextrocurvature of the mid-to-upper thoracic spine  Prescribed Cyclobenzaprine 5 mg up to every 8 hours for 7 days or may take only a night.  Continue with Meloxicam. I recommend taking Meloxicam 15 mg daily for the next 5 -7 days this will help with the inflammation   Follow up with surgeon as needed if symptoms do not improve.      ED Prescriptions     Medication Sig Dispense Auth. Provider   cyclobenzaprine (FLEXERIL) 5 MG tablet Take 1 tablet (5 mg total) by mouth 3 (three) times daily as needed for up to 7 days for muscle spasms. 21 tablet Barbette Merino, NP      PDMP not reviewed this encounter.   Thad Ranger [place], [place] 07/09/22 [phone]

## 2022-07-04 NOTE — ED Triage Notes (Signed)
Pt states that she is having upper left sided back and neck pain started yesterday worse with movement. She did have back should surgery last year. She did take IBU and use bio freeze to help without relief.

## 2022-07-04 NOTE — Discharge Instructions (Addendum)
Xray: Stable cervicothoracic degenerative changes. No acute osseous findings or malalignment No appreciable thoracic vertebral compression fracture. Thoracic spondylosis, as described. Mild dextrocurvature of the mid-to-upper thoracic spine  Prescribed Cyclobenzaprine 5 mg up to every 8 hours for 7 days or may take only a night.  Continue with Meloxicam. I recommend taking Meloxicam 15 mg daily for the next 5 -7 days this will help with the inflammation   Follow up with surgeon as needed if symptoms do not improve.

## 2022-07-07 ENCOUNTER — Ambulatory Visit (INDEPENDENT_AMBULATORY_CARE_PROVIDER_SITE_OTHER): Payer: 59 | Admitting: Nurse Practitioner

## 2022-07-07 ENCOUNTER — Encounter: Payer: Self-pay | Admitting: Nurse Practitioner

## 2022-07-07 VITALS — BP 128/74 | HR 88 | Temp 98.3°F | Wt 215.0 lb

## 2022-07-07 DIAGNOSIS — Z6841 Body Mass Index (BMI) 40.0 and over, adult: Secondary | ICD-10-CM | POA: Diagnosis not present

## 2022-07-07 DIAGNOSIS — M546 Pain in thoracic spine: Secondary | ICD-10-CM

## 2022-07-07 MED ORDER — TRIAMCINOLONE ACETONIDE 40 MG/ML IJ SUSP
40.0000 mg | Freq: Once | INTRAMUSCULAR | Status: AC
  Administered 2022-07-07: 40 mg via INTRAMUSCULAR

## 2022-07-07 NOTE — Patient Instructions (Signed)
Acute Back Pain, Adult Acute back pain is sudden and usually short-lived. It is often caused by an injury to the muscles and tissues in the back. The injury may result from: A muscle, tendon, or ligament getting overstretched or torn. Ligaments are tissues that connect bones to each other. Lifting something improperly can cause a back strain. Wear and tear (degeneration) of the spinal disks. Spinal disks are circular tissue that provide cushioning between the bones of the spine (vertebrae). Twisting motions, such as while playing sports or doing yard work. A hit to the back. Arthritis. You may have a physical exam, lab tests, and imaging tests to find the cause of your pain. Acute back pain usually goes away with rest and home care. Follow these instructions at home: Managing pain, stiffness, and swelling Take over-the-counter and prescription medicines only as told by your health care provider. Treatment may include medicines for pain and inflammation that are taken by mouth or applied to the skin, or muscle relaxants. Your health care provider may recommend applying ice during the first 24-48 hours after your pain starts. To do this: Put ice in a plastic bag. Place a towel between your skin and the bag. Leave the ice on for 20 minutes, 2-3 times a day. Remove the ice if your skin turns bright red. This is very important. If you cannot feel pain, heat, or cold, you have a greater risk of damage to the area. If directed, apply heat to the affected area as often as told by your health care provider. Use the heat source that your health care provider recommends, such as a moist heat pack or a heating pad. Place a towel between your skin and the heat source. Leave the heat on for 20-30 minutes. Remove the heat if your skin turns bright red. This is especially important if you are unable to feel pain, heat, or cold. You have a greater risk of getting burned. Activity  Do not stay in bed. Staying in  bed for more than 1-2 days can delay your recovery. Sit up and stand up straight. Avoid leaning forward when you sit or hunching over when you stand. If you work at a desk, sit close to it so you do not need to lean over. Keep your chin tucked in. Keep your neck drawn back, and keep your elbows bent at a 90-degree angle (right angle). Sit high and close to the steering wheel when you drive. Add lower back (lumbar) support to your car seat, if needed. Take short walks on even surfaces as soon as you are able. Try to increase the length of time you walk each day. Do not sit, drive, or stand in one place for more than 30 minutes at a time. Sitting or standing for long periods of time can put stress on your back. Do not drive or use heavy machinery while taking prescription pain medicine. Use proper lifting techniques. When you bend and lift, use positions that put less stress on your back: Bend your knees. Keep the load close to your body. Avoid twisting. Exercise regularly as told by your health care provider. Exercising helps your back heal faster and helps prevent back injuries by keeping muscles strong and flexible. Work with a physical therapist to make a safe exercise program, as recommended by your health care provider. Do any exercises as told by your physical therapist. Lifestyle Maintain a healthy weight. Extra weight puts stress on your back and makes it difficult to have good   posture. Avoid activities or situations that make you feel anxious or stressed. Stress and anxiety increase muscle tension and can make back pain worse. Learn ways to manage anxiety and stress, such as through exercise. General instructions Sleep on a firm mattress in a comfortable position. Try lying on your side with your knees slightly bent. If you lie on your back, put a pillow under your knees. Keep your head and neck in a straight line with your spine (neutral position) when using electronic equipment like  smartphones or pads. To do this: Raise your smartphone or pad to look at it instead of bending your head or neck to look down. Put the smartphone or pad at the level of your face while looking at the screen. Follow your treatment plan as told by your health care provider. This may include: Cognitive or behavioral therapy. Acupuncture or massage therapy. Meditation or yoga. Contact a health care provider if: You have pain that is not relieved with rest or medicine. You have increasing pain going down into your legs or buttocks. Your pain does not improve after 2 weeks. You have pain at night. You lose weight without trying. You have a fever or chills. You develop nausea or vomiting. You develop abdominal pain. Get help right away if: You develop new bowel or bladder control problems. You have unusual weakness or numbness in your arms or legs. You feel faint. These symptoms may represent a serious problem that is an emergency. Do not wait to see if the symptoms will go away. Get medical help right away. Call your local emergency services (911 in the [place] Do not drive yourself to the hospital. Summary Acute back pain is sudden and usually short-lived. Use proper lifting techniques. When you bend and lift, use positions that put less stress on your back. Take over-the-counter and prescription medicines only as told by your health care provider, and apply heat or ice as told. This information is not intended to replace advice given to you by your health care provider. Make sure you discuss any questions you have with your health care provider. Document Revised: 01/18/2021 Document Reviewed: 01/18/2021 Elsevier Patient Education  2023 [employer]  

## 2022-07-07 NOTE — Progress Notes (Unsigned)
I,Tianna Badgett,acting as a Neurosurgeon for [employer], FNP.,have documented all relevant documentation on the behalf of Arnette Felts, FNP,as directed by  Arnette Felts, FNP while in the presence of Arnette Felts, FNP.  Subjective:     Patient ID: Deborah Jacobson , female    DOB: Dec 02, 1980 , 41 y.o.   MRN: [id number]   Chief Complaint  Patient presents with   Back Pain    HPI  Patient presents today for back pain to upper back since a few days prior on Friday. She was seen at Urgent care treated with Toradol and given a muscle relaxer (which works for a short period of time). She has been using biofreeze to put on her back. She was advised to continue take meloxicam.  She is out of work until today, she is to return to work today.   Back Pain This is a new problem. The pain is present in the thoracic spine. The quality of the pain is described as aching. The pain does not radiate. Pertinent negatives include no abdominal pain or fever. Risk factors include obesity and lack of exercise. She has tried analgesics (She was given a toradol injection.) for the symptoms.     Past Medical History:  Diagnosis Date   Acid reflux    Diabetes in pregnancy    Hypertension    Seasonal allergies      Family History  Problem Relation Age of Onset   Diabetes Mother    Liver disease Mother    Hypertension Mother      Current Outpatient Medications:    albuterol (VENTOLIN HFA) 108 (90 Base) MCG/ACT inhaler, Inhale 2 puffs into the lungs every 6 (six) hours as needed for wheezing or shortness of breath., Disp: 6.7 g, Rfl: 1   cyclobenzaprine (FLEXERIL) 5 MG tablet, Take 1 tablet (5 mg total) by mouth 3 (three) times daily as needed for up to 7 days for muscle spasms., Disp: 21 tablet, Rfl: 0   hydrochlorothiazide (HYDRODIURIL) 12.5 MG tablet, TAKE 1 TABLET(12.5 MG) BY MOUTH DAILY, Disp: 90 tablet, Rfl: 1   ibuprofen (ADVIL) 800 MG tablet, Take 1 tablet (800 mg total) by mouth every 8 (eight) hours  as needed., Disp: 30 tablet, Rfl: 0   meloxicam (MOBIC) 15 MG tablet, Take 0.5-1 tablets (7.5-15 mg total) by mouth daily as needed for pain., Disp: 30 tablet, Rfl: 6   traZODone (DESYREL) 50 MG tablet, Take 1 tablet (50 mg total) by mouth at bedtime as needed for sleep., Disp: 30 tablet, Rfl: 0  Current Facility-Administered Medications:    triamcinolone acetonide (KENALOG-40) injection 40 mg, 40 mg, Intramuscular, Once, Arnette Felts, FNP   Allergies  Allergen Reactions   Latex Hives    Powder from gloves     Review of Systems  Constitutional: Negative.  Negative for fever.  Respiratory: Negative.    Cardiovascular: Negative.   Gastrointestinal: Negative.  Negative for abdominal pain.  Musculoskeletal:  Positive for back pain.  Neurological: Negative.   Psychiatric/Behavioral: Negative.       Today's Vitals   07/07/22 0834  BP: 128/74  Pulse: 88  Temp: 98.3 F (36.8 C)  TempSrc: Oral  Weight: 215 lb (97.5 kg)   Body mass index is 43.42 kg/m.  Wt Readings from Last 3 Encounters:  07/07/22 215 lb (97.5 kg)  05/22/22 214 lb 12.8 oz (97.4 kg)  03/13/22 206 lb (93.4 kg)    Objective:  Physical Exam Vitals reviewed.  Constitutional:  General: She is not in acute distress.    Appearance: Normal appearance. She is well-developed. She is obese.  Cardiovascular:     Rate and Rhythm: Normal rate and regular rhythm.     Pulses: Normal pulses.     Heart sounds: Normal heart sounds. No murmur heard. Pulmonary:     Effort: Pulmonary effort is normal. No respiratory distress.     Breath sounds: Normal breath sounds. No wheezing.  Chest:     Chest wall: No tenderness.  Musculoskeletal:        General: Tenderness present. No swelling. Normal range of motion.  Skin:    General: Skin is warm and dry.     Capillary Refill: Capillary refill takes less than 2 seconds.  Neurological:     General: No focal deficit present.     Mental Status: She is alert and oriented to  person, place, and time.     Cranial Nerves: No cranial nerve deficit.     Motor: No weakness.  Psychiatric:        Mood and Affect: Mood normal.        Behavior: Behavior normal.        Thought Content: Thought content normal.        Judgment: Judgment normal.         Assessment And Plan:     1. Acute midline thoracic back pain Comments: Reviewed xray results in EPIC shows stable degenerative dz, will treat with Kenalog injection. Continue meloxicam. If not better by Thurs will refer to PT - triamcinolone acetonide (KENALOG-40) injection 40 mg  2. Class 3 severe obesity due to excess calories without serious comorbidity with body mass index (BMI) of 40.0 to 44.9 in adult [facility]) She is encouraged to strive for BMI less than 30 to decrease cardiac risk. Advised to aim for at least 150 minutes of exercise per week.   Patient was given opportunity to ask questions. Patient verbalized understanding of the plan and was able to repeat key elements of the plan. All questions were answered to their satisfaction.  Arnette Felts, FNP   I, Arnette Felts, FNP, have reviewed all documentation for this visit. The documentation on 07/07/22 for the exam, diagnosis, procedures, and orders are all accurate and complete.   IF YOU HAVE BEEN REFERRED TO A SPECIALIST, IT MAY TAKE 1-2 WEEKS TO SCHEDULE/PROCESS THE REFERRAL. IF YOU HAVE NOT HEARD FROM US/SPECIALIST IN TWO WEEKS, PLEASE GIVE [place] A CALL AT [phone].   THE PATIENT IS ENCOURAGED TO PRACTICE SOCIAL DISTANCING DUE TO THE COVID-19 PANDEMIC.

## 2022-07-09 ENCOUNTER — Encounter: Payer: Self-pay | Admitting: Nurse Practitioner

## 2022-07-24 ENCOUNTER — Other Ambulatory Visit: Payer: Self-pay | Admitting: Nurse Practitioner

## 2022-07-24 MED ORDER — CYCLOBENZAPRINE HCL 10 MG PO TABS: 10.0000 mg | ORAL_TABLET | Freq: Three times a day (TID) | ORAL | 0 refills | Status: DC | PRN

## 2022-07-25 ENCOUNTER — Other Ambulatory Visit: Payer: Self-pay

## 2022-07-25 DIAGNOSIS — R7303 Prediabetes: Secondary | ICD-10-CM

## 2022-07-25 MED ORDER — HYDROCHLOROTHIAZIDE 12.5 MG PO TABS: ORAL_TABLET | ORAL | 1 refills | Status: DC

## 2022-08-12 ENCOUNTER — Encounter: Payer: Self-pay | Admitting: Nurse Practitioner

## 2022-08-13 ENCOUNTER — Encounter: Payer: Self-pay | Admitting: Nurse Practitioner

## 2022-08-13 ENCOUNTER — Ambulatory Visit (INDEPENDENT_AMBULATORY_CARE_PROVIDER_SITE_OTHER): Payer: 59 | Admitting: Nurse Practitioner

## 2022-08-13 VITALS — BP 102/89 | HR 88 | Temp 98.1°F | Ht 59.0 in | Wt 213.6 lb

## 2022-08-13 DIAGNOSIS — R7303 Prediabetes: Secondary | ICD-10-CM | POA: Diagnosis not present

## 2022-08-13 DIAGNOSIS — I10 Essential (primary) hypertension: Secondary | ICD-10-CM

## 2022-08-13 DIAGNOSIS — Z6841 Body Mass Index (BMI) 40.0 and over, adult: Secondary | ICD-10-CM

## 2022-08-13 DIAGNOSIS — R42 Dizziness and giddiness: Secondary | ICD-10-CM

## 2022-08-13 DIAGNOSIS — R112 Nausea with vomiting, unspecified: Secondary | ICD-10-CM

## 2022-08-13 MED ORDER — ONDANSETRON HCL 4 MG PO TABS: 4.0000 mg | ORAL_TABLET | Freq: Every day | ORAL | 1 refills | Status: DC | PRN

## 2022-08-13 MED ORDER — HYDROCHLOROTHIAZIDE 12.5 MG PO TABS: ORAL_TABLET | ORAL | 1 refills | Status: DC

## 2022-08-13 MED ORDER — PROMETHAZINE HCL 50 MG/ML IJ SOLN: 25.0000 mg | Freq: Four times a day (QID) | INTRAMUSCULAR | Status: DC | PRN

## 2022-08-13 NOTE — Patient Instructions (Addendum)
Hypertension, Adult High blood pressure (hypertension) is when the force of blood pumping through the arteries is too strong. The arteries are the blood vessels that carry blood from the heart throughout the body. Hypertension forces the heart to work harder to pump blood and may cause arteries to become narrow or stiff. Untreated or uncontrolled hypertension can lead to a heart attack, heart failure, a stroke, kidney disease, and other problems. A blood pressure reading consists of a higher number over a lower number. Ideally, your blood pressure should be below 120/80. The first ("top") number is called the systolic pressure. It is a measure of the pressure in your arteries as your heart beats. The second ("bottom") number is called the diastolic pressure. It is a measure of the pressure in your arteries as the heart relaxes. What are the causes? The exact cause of this condition is not known. There are some conditions that result in high blood pressure. What increases the risk? Certain factors may make you more likely to develop high blood pressure. Some of these risk factors are under your control, including: Smoking. Not getting enough exercise or physical activity. Being overweight. Having too much fat, sugar, calories, or salt (sodium) in your diet. Drinking too much alcohol. Other risk factors include: Having a personal history of heart disease, diabetes, high cholesterol, or kidney disease. Stress. Having a family history of high blood pressure and high cholesterol. Having obstructive sleep apnea. Age. The risk increases with age. What are the signs or symptoms? High blood pressure may not cause symptoms. Very high blood pressure (hypertensive crisis) may cause: Headache. Fast or irregular heartbeats (palpitations). Shortness of breath. Nosebleed. Nausea and vomiting. Vision changes. Severe chest pain, dizziness, and seizures. How is this diagnosed? This condition is diagnosed by  measuring your blood pressure while you are seated, with your arm resting on a flat surface, your legs uncrossed, and your feet flat on the floor. The cuff of the blood pressure monitor will be placed directly against the skin of your upper arm at the level of your heart. Blood pressure should be measured at least twice using the same arm. Certain conditions can cause a difference in blood pressure between your right and left arms. If you have a high blood pressure reading during one visit or you have normal blood pressure with other risk factors, you may be asked to: Return on a different day to have your blood pressure checked again. Monitor your blood pressure at home for 1 week or longer. If you are diagnosed with hypertension, you may have other blood or imaging tests to help your health care provider understand your overall risk for other conditions. How is this treated? This condition is treated by making healthy lifestyle changes, such as eating healthy foods, exercising more, and reducing your alcohol intake. You may be referred for counseling on a healthy diet and physical activity. Your health care provider may prescribe medicine if lifestyle changes are not enough to get your blood pressure under control and if: Your systolic blood pressure is above 130. Your diastolic blood pressure is above 80. Your personal target blood pressure may vary depending on your medical conditions, your age, and other factors. Follow these instructions at home: Eating and drinking  Eat a diet that is high in fiber and potassium, and low in sodium, added sugar, and fat. An example of this eating plan is called the DASH diet. DASH stands for Dietary Approaches to Stop Hypertension. To eat this way: Eat   plenty of fresh fruits and vegetables. Try to fill one half of your plate at each meal with fruits and vegetables. Eat whole grains, such as whole-wheat pasta, brown rice, or whole-grain bread. Fill about one  fourth of your plate with whole grains. Eat or drink low-fat dairy products, such as skim milk or low-fat yogurt. Avoid fatty cuts of meat, processed or cured meats, and poultry with skin. Fill about one fourth of your plate with lean proteins, such as fish, chicken without skin, beans, eggs, or tofu. Avoid pre-made and processed foods. These tend to be higher in sodium, added sugar, and fat. Reduce your daily sodium intake. Many people with hypertension should eat less than 1,500 mg of sodium a day. Do not drink alcohol if: Your health care provider tells you not to drink. You are pregnant, may be pregnant, or are planning to become pregnant. If you drink alcohol: Limit how much you have to: 0-1 drink a day for women. 0-2 drinks a day for men. Know how much alcohol is in your drink. In the [place] one drink equals one 12 oz bottle of beer (355 mL), one 5 oz glass of wine (148 mL), or one 1 oz glass of hard liquor (44 mL). Lifestyle  Work with your health care provider to maintain a healthy body weight or to lose weight. Ask what an ideal weight is for you. Get at least 30 minutes of exercise that causes your heart to beat faster (aerobic exercise) most days of the week. Activities may include walking, swimming, or biking. Include exercise to strengthen your muscles (resistance exercise), such as Pilates or lifting weights, as part of your weekly exercise routine. Try to do these types of exercises for 30 minutes at least 3 days a week. Do not use any products that contain nicotine or tobacco. These products include cigarettes, chewing tobacco, and vaping devices, such as e-cigarettes. If you need help quitting, ask your health care provider. Monitor your blood pressure at home as told by your health care provider. Keep all follow-up visits. This is important. Medicines Take over-the-counter and prescription medicines only as told by your health care provider. Follow directions carefully. Blood  pressure medicines must be taken as prescribed. Do not skip doses of blood pressure medicine. Doing this puts you at risk for problems and can make the medicine less effective. Ask your health care provider about side effects or reactions to medicines that you should watch for. Contact a health care provider if you: Think you are having a reaction to a medicine you are taking. Have headaches that keep coming back (recurring). Feel dizzy. Have swelling in your ankles. Have trouble with your vision. Get help right away if you: Develop a severe headache or confusion. Have unusual weakness or numbness. Feel faint. Have severe pain in your chest or abdomen. Vomit repeatedly. Have trouble breathing. These symptoms may be an emergency. Get help right away. Call 911. Do not wait to see if the symptoms will go away. Do not drive yourself to the hospital. Summary Hypertension is when the force of blood pumping through your arteries is too strong. If this condition is not controlled, it may put you at risk for serious complications. Your personal target blood pressure may vary depending on your medical conditions, your age, and other factors. For most people, a normal blood pressure is less than 120/80. Hypertension is treated with lifestyle changes, medicines, or a combination of both. Lifestyle changes include losing weight, eating a healthy,   low-sodium diet, exercising more, and limiting alcohol. This information is not intended to replace advice given to you by your health care provider. Make sure you discuss any questions you have with your health care provider. Document Revised: 09/03/2021 Document Reviewed: 09/03/2021 Elsevier Patient Education  2023 Elsevier Inc.   Nausea and Vomiting, Adult Nausea is feeling that you have an upset stomach and that you are about to vomit. Vomiting is when food in your stomach forcefully comes out of your mouth. Vomiting can make you feel weak. If you  vomit, or if you are not able to drink enough fluids, you may not have enough water in your body (get dehydrated). If you do not have enough water in your body, you may: Feel tired. Feel thirsty. Have a dry mouth. Have cracked lips. Pee (urinate) less often. Older adults and people with other diseases or a weak body defense system (immune system) are at higher risk for not having enough water in the body. If you feel like you may vomit or you vomit, it is important to follow instructions from your doctor about how to take care of yourself. Follow these instructions at home: Watch your symptoms for any changes. Tell your doctor about them. Eating and drinking     Take an ORS (oral rehydration solution). This is a drink that is sold at pharmacies and stores. Drink clear fluids in small amounts as you are able, such as: Water. Ice chips. Fruit juice that has water added (diluted fruit juice). Low-calorie sports drinks. Eat bland, easy-to-digest foods in small amounts as you are able, such as: Bananas. Applesauce. Rice. Low-fat (lean) meats. Toast. Crackers. Avoid drinking fluids that have a lot of sugar or caffeine in them. This includes energy drinks, sports drinks, and soda. Avoid alcohol. Avoid spicy or fatty foods. General instructions Take over-the-counter and prescription medicines only as told by your doctor. Drink enough fluid to keep your pee (urine) pale yellow. Wash your hands often with soap and water for at least 20 seconds. If you cannot use soap and water, use hand sanitizer. Make sure that everyone in your home washes their hands well and often. Rest at home until you feel better. Watch your condition for any changes. Take slow and deep breaths when you feel like you may vomit. Keep all follow-up visits. Contact a doctor if: Your symptoms get worse. You have new symptoms. You have a fever. You cannot drink fluids without vomiting. You feel like you may vomit  for more than 2 days. You feel light-headed or dizzy. You have a headache. You have muscle cramps. You have a rash. You have pain while peeing. Get help right away if: You have pain in your chest, neck, arm, or jaw. You feel very weak or you faint. You vomit again and again. You have vomit that is bright red or looks like black coffee grounds. You have bloody or black poop (stools) or poop that looks like tar. You have a very bad headache, a stiff neck, or both. You have very bad pain, cramping, or bloating in your belly (abdomen). You have trouble breathing. You are breathing very quickly. Your heart is beating very quickly. Your skin feels cold and clammy. You feel confused. You have signs of losing too much water in your body, such as: Dark pee, very little pee, or no pee. Cracked lips. Dry mouth. Sunken eyes. Sleepiness. Weakness. These symptoms may be an emergency. Get help right away. Call 911. Do not wait to  see if the symptoms will go away. Do not drive yourself to the hospital. Summary Nausea is feeling that you have an upset stomach and that you are about to vomit. Vomiting is when food in your stomach comes out of your mouth. Follow instructions from your doctor about eating and drinking. Take over-the-counter and prescription medicines only as told by your doctor. Contact your doctor if your symptoms get worse or you have new symptoms. Keep all follow-up visits. This information is not intended to replace advice given to you by your health care provider. Make sure you discuss any questions you have with your health care provider. Document Revised: 05/03/2021 Document Reviewed: 05/03/2021 Elsevier Patient Education  [employer].

## 2022-08-13 NOTE — Progress Notes (Signed)
I,Tianna Badgett,acting as a Education administrator for [employer], FNP.,have documented all relevant documentation on the behalf of Minette Brine, FNP,as directed by  Minette Brine, FNP while in the presence of Minette Brine, [place].   Subjective:     Patient ID: Deborah Jacobson , female    DOB: [birth year] , 41 y.o.   MRN: [id number]   Chief Complaint  Patient presents with   Hypertension    HPI  Patient presents today for HTN follow up. She took her blood pressure medication medications at 1030 just before her appt.   She has tried to eat yesterday ([employer]) once. She is trying to force liquids. No other family members sick in the house.   Hypertension This is a chronic problem. The current episode started more than 1 year ago. The problem is controlled. Pertinent negatives include no anxiety or chest pain. There are no associated agents to hypertension. Risk factors for coronary artery disease include obesity and sedentary lifestyle. Past treatments include diuretics. There are no compliance problems.   Emesis  This is a new problem. The current episode started in the past 7 days. The problem occurs 2 to 4 times per day. Associated symptoms include dizziness. Pertinent negatives include no abdominal pain, chest pain, coughing, diarrhea or myalgias. Associated symptoms comments: She checked for covid which was negative. . She has tried increased fluids for the symptoms.     Past Medical History:  Diagnosis Date   Acid reflux    Diabetes in pregnancy    Hypertension    Seasonal allergies      Family History  Problem Relation Age of Onset   Diabetes Mother    Liver disease Mother    Hypertension Mother      Current Outpatient Medications:    ondansetron (ZOFRAN) 4 MG tablet, Take 1 tablet (4 mg total) by mouth daily as needed for nausea or vomiting., Disp: 30 tablet, Rfl: 1   albuterol (VENTOLIN HFA) 108 (90 Base) MCG/ACT inhaler, Inhale 2 puffs into the lungs every 6 (six) hours as needed  for wheezing or shortness of breath., Disp: 6.7 g, Rfl: 1   cyclobenzaprine (FLEXERIL) 10 MG tablet, Take 1 tablet (10 mg total) by mouth 3 (three) times daily as needed for muscle spasms., Disp: 30 tablet, Rfl: 0   hydrochlorothiazide (HYDRODIURIL) 12.5 MG tablet, TAKE 1 TABLET(12.5 MG) BY MOUTH DAILY, Disp: 90 tablet, Rfl: 1   ibuprofen (ADVIL) 800 MG tablet, Take 1 tablet (800 mg total) by mouth every 8 (eight) hours as needed., Disp: 30 tablet, Rfl: 0   meloxicam (MOBIC) 15 MG tablet, Take 0.5-1 tablets (7.5-15 mg total) by mouth daily as needed for pain., Disp: 30 tablet, Rfl: 6   traZODone (DESYREL) 50 MG tablet, Take 1 tablet (50 mg total) by mouth at bedtime as needed for sleep., Disp: 30 tablet, Rfl: 0  Current Facility-Administered Medications:    promethazine (PHENERGAN) injection 25 mg, 25 mg, Intramuscular, Q6H PRN, Minette Brine, FNP   Allergies  Allergen Reactions   Latex Hives    Powder from gloves     Review of Systems  Constitutional: Negative.   Respiratory: Negative.  Negative for cough.   Cardiovascular: Negative.  Negative for chest pain.  Gastrointestinal:  Positive for nausea and vomiting. Negative for abdominal pain and diarrhea.  Musculoskeletal:  Negative for myalgias.  Neurological:  Positive for dizziness.  Psychiatric/Behavioral: Negative.       Today's Vitals   08/13/22 1149  BP: 102/89  Pulse: 88  Temp: 98.1 F (36.7 C)  TempSrc: Oral  Weight: 213 lb 9.6 oz (96.9 kg)  Height: '4\' 11"'  (1.499 m)   Body mass index is 43.14 kg/m.   Objective:  Physical Exam Vitals reviewed.  Constitutional:      General: She is not in acute distress.    Appearance: Normal appearance. She is well-developed. She is obese.  Cardiovascular:     Rate and Rhythm: Normal rate and regular rhythm.     Pulses: Normal pulses.     Heart sounds: Normal heart sounds. No murmur heard. Pulmonary:     Effort: Pulmonary effort is normal. No respiratory distress.     Breath  sounds: Normal breath sounds. No wheezing.  Chest:     Chest wall: No tenderness.  Musculoskeletal:        General: No swelling or tenderness. Normal range of motion.  Skin:    General: Skin is warm and dry.     Capillary Refill: Capillary refill takes less than 2 seconds.  Neurological:     General: No focal deficit present.     Mental Status: She is alert and oriented to person, place, and time.     Cranial Nerves: No cranial nerve deficit.     Motor: No weakness.  Psychiatric:        Mood and Affect: Mood normal.        Behavior: Behavior normal.        Thought Content: Thought content normal.        Judgment: Judgment normal.         Assessment And Plan:     1. Essential hypertension Comments: Blood pressure is slightly elevated, continue limiting intake of high salt foods.  - BMP8+eGFR  2. Prediabetes Comments: Stable, mostly diet controlled. Will check HgbA1c.  - hydrochlorothiazide (HYDRODIURIL) 12.5 MG tablet; TAKE 1 TABLET(12.5 MG) BY MOUTH DAILY  Dispense: 90 tablet; Refill: 1 - Hemoglobin A1c  3. Class 3 severe obesity due to excess calories without serious comorbidity with body mass index (BMI) of 40.0 to 44.9 in adult [facility]) Chronic Discussed healthy diet and regular exercise options  Encouraged to exercise at least 150 minutes per week with 2 days of strength training  4. Nausea and vomiting, unspecified vomiting type - CBC - BMP8+eGFR - promethazine (PHENERGAN) injection 25 mg - ondansetron (ZOFRAN) 4 MG tablet; Take 1 tablet (4 mg total) by mouth daily as needed for nausea or vomiting.  Dispense: 30 tablet; Refill: 1  5. Lightheaded Comments: Orthostats are normal. Encouraged to stay well hydrated with water.  - CBC - BMP8+eGFR    Patient was given opportunity to ask questions. Patient verbalized understanding of the plan and was able to repeat key elements of the plan. All questions were answered to their satisfaction.  Minette Brine, FNP   I,  Minette Brine, FNP, have reviewed all documentation for this visit. The documentation on 08/13/22 for the exam, diagnosis, procedures, and orders are all accurate and complete.   IF YOU HAVE BEEN REFERRED TO A SPECIALIST, IT MAY TAKE 1-2 WEEKS TO SCHEDULE/PROCESS THE REFERRAL. IF YOU HAVE NOT HEARD FROM US/SPECIALIST IN TWO WEEKS, PLEASE GIVE [place] A CALL AT [phone].   THE PATIENT IS ENCOURAGED TO PRACTICE SOCIAL DISTANCING DUE TO THE COVID-19 PANDEMIC.

## 2022-08-14 LAB — BMP8+EGFR
BUN/Creatinine Ratio: 16 (ref 9–23)
BUN: 13 mg/dL (ref 6–24)
CO2: 24 mmol/L (ref 20–29)
Calcium: 9.2 mg/dL (ref 8.7–10.2)
Chloride: 99 mmol/L (ref 96–106)
Creatinine, Ser: 0.81 mg/dL (ref 0.57–1.00)
Glucose: 80 mg/dL (ref 70–99)
Potassium: 4.3 mmol/L (ref 3.5–5.2)
Sodium: 136 mmol/L (ref 134–144)
eGFR: 93 mL/min/{1.73_m2} (ref >59)

## 2022-08-14 LAB — CBC
Hematocrit: 37.4 % (ref 34.0–46.6)
Hemoglobin: 12.1 g/dL (ref 11.1–15.9)
MCH: 26.5 pg — ABNORMAL LOW (ref 26.6–33.0)
MCHC: 32.4 g/dL (ref 31.5–35.7)
MCV: 82 fL (ref 79–97)
Platelets: 342 10*3/uL (ref 150–450)
RBC: 4.57 x10E6/uL (ref 3.77–5.28)
RDW: 15.7 % — ABNORMAL HIGH (ref 11.7–15.4)
WBC: 4.8 10*3/uL (ref 3.4–10.8)

## 2022-08-14 LAB — HEMOGLOBIN A1C
Est. average glucose Bld gHb Est-mCnc: 123 mg/dL
Hgb A1c MFr Bld: 5.9 % — ABNORMAL HIGH (ref 4.8–5.6)

## 2022-08-21 ENCOUNTER — Other Ambulatory Visit: Payer: Self-pay | Admitting: Nurse Practitioner

## 2022-08-22 MED ORDER — PROMETHAZINE HCL 25 MG/ML IJ SOLN
25.0000 mg | Freq: Once | INTRAMUSCULAR | Status: AC
  Administered 2022-08-13: 25 mg via INTRAMUSCULAR

## 2022-08-22 NOTE — Addendum Note (Signed)
Addended by: Roxine Caddy on: 08/22/2022 01:34 PM   Modules accepted: Orders

## 2022-09-15 ENCOUNTER — Other Ambulatory Visit: Payer: Self-pay

## 2022-09-15 ENCOUNTER — Encounter: Payer: Self-pay | Admitting: Nurse Practitioner

## 2022-09-15 MED ORDER — CYCLOBENZAPRINE HCL 10 MG PO TABS: 10.0000 mg | ORAL_TABLET | Freq: Three times a day (TID) | ORAL | 0 refills | Status: DC | PRN

## 2022-09-29 ENCOUNTER — Ambulatory Visit: Payer: 59 | Admitting: Family

## 2022-09-30 ENCOUNTER — Encounter: Payer: Self-pay | Admitting: Nurse Practitioner

## 2022-10-01 ENCOUNTER — Ambulatory Visit (INDEPENDENT_AMBULATORY_CARE_PROVIDER_SITE_OTHER): Payer: 59 | Admitting: Nurse Practitioner

## 2022-10-01 ENCOUNTER — Encounter: Payer: Self-pay | Admitting: Nurse Practitioner

## 2022-10-01 VITALS — BP 128/62 | HR 79 | Temp 98.4°F | Ht 59.0 in | Wt 218.0 lb

## 2022-10-01 DIAGNOSIS — Z23 Encounter for immunization: Secondary | ICD-10-CM | POA: Diagnosis not present

## 2022-10-01 DIAGNOSIS — R21 Rash and other nonspecific skin eruption: Secondary | ICD-10-CM | POA: Diagnosis not present

## 2022-10-01 DIAGNOSIS — Z6841 Body Mass Index (BMI) 40.0 and over, adult: Secondary | ICD-10-CM

## 2022-10-01 DIAGNOSIS — E66813 Obesity, class 3: Secondary | ICD-10-CM

## 2022-10-01 MED ORDER — TRIAMCINOLONE ACETONIDE 0.5 % EX CREA: 1.0000 | TOPICAL_CREAM | Freq: Three times a day (TID) | CUTANEOUS | 1 refills | Status: DC

## 2022-10-01 NOTE — Patient Instructions (Addendum)

## 2022-10-01 NOTE — Progress Notes (Signed)
I,Tianna Badgett,acting as a Education administrator for [employer], FNP.,have documented all relevant documentation on the behalf of Minette Brine, FNP,as directed by  Minette Brine, FNP while in the presence of Minette Brine, [place].  Subjective:     Patient ID: Deborah Jacobson , female    DOB: 10/19/1981 , 41 y.o.   MRN: [id number]   Chief Complaint  Patient presents with   Eye Problem    HPI  Patient presents today for eye problem. Her symptoms started on Friday. In the morning her eye is "puffy". She is using a hot compress. No vision changes with her current symptoms. She had a rash to right side of face near eye. She had applied neosporin to the area. Denies fever.      Past Medical History:  Diagnosis Date   Acid reflux    Diabetes in pregnancy    Hypertension    Seasonal allergies      Family History  Problem Relation Age of Onset   Diabetes Mother    Liver disease Mother    Hypertension Mother      Current Outpatient Medications:    albuterol (VENTOLIN HFA) 108 (90 Base) MCG/ACT inhaler, Inhale 2 puffs into the lungs every 6 (six) hours as needed for wheezing or shortness of breath., Disp: 6.7 g, Rfl: 1   cyclobenzaprine (FLEXERIL) 10 MG tablet, Take 1 tablet (10 mg total) by mouth 3 (three) times daily as needed for muscle spasms., Disp: 30 tablet, Rfl: 0   hydrochlorothiazide (HYDRODIURIL) 12.5 MG tablet, TAKE 1 TABLET(12.5 MG) BY MOUTH DAILY, Disp: 90 tablet, Rfl: 1   ibuprofen (ADVIL) 800 MG tablet, Take 1 tablet (800 mg total) by mouth every 8 (eight) hours as needed., Disp: 30 tablet, Rfl: 0   meloxicam (MOBIC) 15 MG tablet, TAKE 1/2 TO 1 TABLET BY MOUTH DAILY AS NEEDED FOR PAIN, Disp: 30 tablet, Rfl: 3   ondansetron (ZOFRAN) 4 MG tablet, Take 1 tablet (4 mg total) by mouth daily as needed for nausea or vomiting., Disp: 30 tablet, Rfl: 1   traZODone (DESYREL) 50 MG tablet, Take 1 tablet (50 mg total) by mouth at bedtime as needed for sleep., Disp: 30 tablet, Rfl: 0    triamcinolone cream (KENALOG) 0.5 %, Apply 1 Application topically 3 (three) times daily., Disp: 30 g, Rfl: 1   Allergies  Allergen Reactions   Latex Hives    Powder from gloves     Review of Systems  Constitutional: Negative.   Eyes:  Positive for discharge.  Respiratory: Negative.  Negative for cough.   Cardiovascular: Negative.  Negative for chest pain.  Musculoskeletal:  Negative for myalgias.  Skin: Negative.   Neurological:  Negative for dizziness.  Psychiatric/Behavioral: Negative.       Today's Vitals   10/01/22 0909  BP: 128/62  Pulse: 79  Temp: 98.4 F (36.9 C)  TempSrc: Oral  Weight: 218 lb (98.9 kg)  Height: 4\' 11"  (1.499 m)   Body mass index is 44.03 kg/m.  Wt Readings from Last 3 Encounters:  10/01/22 218 lb (98.9 kg)  08/13/22 213 lb 9.6 oz (96.9 kg)  07/07/22 215 lb (97.5 kg)    Objective:  Physical Exam Vitals reviewed.  Constitutional:      General: She is not in acute distress.    Appearance: Normal appearance. She is well-developed. She is obese.  Pulmonary:     Effort: Pulmonary effort is normal. No respiratory distress.     Breath sounds: No wheezing.  Musculoskeletal:  General: No swelling or tenderness. Normal range of motion.  Skin:    General: Skin is warm and dry.     Capillary Refill: Capillary refill takes less than 2 seconds.     Findings: Rash (right lateral eye dry and scaly) present.  Neurological:     General: No focal deficit present.     Mental Status: She is alert and oriented to person, place, and time.     Cranial Nerves: No cranial nerve deficit.     Motor: No weakness.  Psychiatric:        Mood and Affect: Mood normal.        Behavior: Behavior normal.        Thought Content: Thought content normal.        Judgment: Judgment normal.         Assessment And Plan:     1. Rash and nonspecific skin eruption Comments: Dry, scaly rash to right lateral area of right eye. Apply small amount of triamnicilone -  triamcinolone cream (KENALOG) 0.5 %; Apply 1 Application topically 3 (three) times daily.  Dispense: 30 g; Refill: 1  2. Class 3 severe obesity due to excess calories without serious comorbidity with body mass index (BMI) of 40.0 to 44.9 in adult [facility]) Chronic Discussed healthy diet and regular exercise options  Encouraged to exercise at least 150 minutes per week with 2 days of strength training She is encouraged to strive for BMI less than 30 to decrease cardiac risk.   3. Need for influenza vaccination Influenza vaccine administered Encouraged to take Tylenol as needed for fever or muscle aches. - Flu Vaccine QUAD 6+ mos PF IM (Fluarix Quad PF)   Patient was given opportunity to ask questions. Patient verbalized understanding of the plan and was able to repeat key elements of the plan. All questions were answered to their satisfaction.  Arnette Felts, FNP   I, Arnette Felts, FNP, have reviewed all documentation for this visit. The documentation on 10/01/22 for the exam, diagnosis, procedures, and orders are all accurate and complete.   IF YOU HAVE BEEN REFERRED TO A SPECIALIST, IT MAY TAKE 1-2 WEEKS TO SCHEDULE/PROCESS THE REFERRAL. IF YOU HAVE NOT HEARD FROM US/SPECIALIST IN TWO WEEKS, PLEASE GIVE [place] A CALL AT [phone] X [phone].   THE PATIENT IS ENCOURAGED TO PRACTICE SOCIAL DISTANCING DUE TO THE COVID-19 PANDEMIC.

## 2022-10-13 ENCOUNTER — Encounter: Payer: Self-pay | Admitting: Nurse Practitioner

## 2022-10-17 ENCOUNTER — Ambulatory Visit: Payer: Self-pay

## 2022-10-17 ENCOUNTER — Encounter (HOSPITAL_COMMUNITY): Payer: Self-pay | Admitting: Emergency Medicine

## 2022-10-17 ENCOUNTER — Ambulatory Visit (HOSPITAL_COMMUNITY)
Admission: EM | Admit: 2022-10-17 | Discharge: 2022-10-17 | Disposition: A | Discharge status: Home / Self Care | Payer: 59 | Attending: Internal Medicine | Admitting: Internal Medicine

## 2022-10-17 DIAGNOSIS — J069 Acute upper respiratory infection, unspecified: Secondary | ICD-10-CM

## 2022-10-17 DIAGNOSIS — Z1152 Encounter for screening for COVID-19: Secondary | ICD-10-CM | POA: Insufficient documentation

## 2022-10-17 LAB — RESP PANEL BY RT-PCR (FLU A&B, COVID) ARPGX2
Influenza A by PCR: NEGATIVE
Influenza B by PCR: NEGATIVE
SARS Coronavirus 2 by RT PCR: NEGATIVE

## 2022-10-17 LAB — POC INFLUENZA A AND B ANTIGEN (URGENT CARE ONLY)
INFLUENZA A ANTIGEN, POC: NEGATIVE
INFLUENZA B ANTIGEN, POC: NEGATIVE

## 2022-10-17 MED ORDER — IBUPROFEN 800 MG PO TABS
ORAL_TABLET | ORAL | Status: AC
  Filled 2022-10-17: qty 1

## 2022-10-17 MED ORDER — IBUPROFEN 800 MG PO TABS
800.0000 mg | ORAL_TABLET | Freq: Once | ORAL | Status: AC
  Administered 2022-10-17: 800 mg via ORAL

## 2022-10-17 NOTE — ED Triage Notes (Signed)
Sinus pain and mucous that started last night. Nasal congested and stopped and body aches with chills. Took OTC sinus medications.

## 2022-10-17 NOTE — Discharge Instructions (Addendum)
Your symptoms are most likely viral in nature and antibiotics are not necessary at this time.  Your rapid flu test was negative but I have sent off flu and COVID PCR testing.  Those results should be available in the next couple hours.  If you are positive for flu I will call him Tamiflu and notify you when this is done.  Symptomatic treatment as discussed. Hot steamy shower or humidifier to break up congestion. Saline nasal spray to keep nasal passages moisturized. Increase oral fluid intake to keep mucous thin. Acetaminophen and/or Ibuprofen as directed for fever or body aches. Return to clinic or follow-up is symptoms are not improving or for any worsening symptoms.

## 2022-10-17 NOTE — ED Provider Notes (Signed)
[facility]    CSN: [id number] Arrival date & time: 10/17/22  1637      History   Chief Complaint Chief Complaint  Patient presents with   Headache   Facial Pain    HPI Deborah Jacobson is a 41 y.o. female presents to urgent care today with complaints of headache, body aches and congestion.  Patient states she felt slight headache when she got off work early this morning and went to bed.  States she awoke at 2 PM feeling much worse with generalized bodyaches, congestion, runny nose and headache.  She denies any cough, sore throat, shortness of breath.  Patient works in nursing home.  Takes HCTZ for hypertension but has not had today's dose.   Past Medical History:  Diagnosis Date   Acid reflux    Diabetes in pregnancy    Hypertension    Seasonal allergies     Patient Active Problem List   Diagnosis Date Noted   Subacromial bursitis of left shoulder joint 08/29/2020   Tendinopathy of left rotator cuff 08/29/2020   Morbid obesity with BMI of 45.0-49.9, adult (HCC) 08/09/2020   Superior glenoid labrum lesion of left shoulder 07/10/2020   Health maintenance examination 03/23/2019   Prediabetes 12/07/18   Strain of back 07-Dec-2018   Death of family member Dec 07, 2018   Back pain 07/31/2018   Chronic low back pain 11/30/2017   Shifting sleep-work schedule 06/15/2017   Paradoxical insomnia 06/15/2017   Sleep related headaches 06/15/2017   Super obese 06/15/2017   Mood complaints in sleep disorder 06/15/2017   Plantar fasciitis of left foot 03/01/2014   Porokeratosis 03/01/2014   Pain in lower limb 03/01/2014    Past Surgical History:  Procedure Laterality Date   CESAREAN SECTION     SHOULDER ARTHROSCOPY WITH BICEPS TENDON REPAIR Left 08/29/2020   Procedure: LEFT SHOULDER ARTHROSCOPY WITH BICEPS TENODESIS;  Surgeon: Tarry Kos, MD;  Location: [facility];  Service: Orthopedics;  Laterality: Left;   TUBAL LIGATION      OB History      Gravida  6   Para  4   Term  4   Preterm      AB  2   Living  4      SAB  1   IAB  1   Ectopic      Multiple      Live Births  4            Home Medications    Prior to Admission medications   Medication Sig Start Date End Date Taking? Authorizing Provider  albuterol (VENTOLIN HFA) 108 (90 Base) MCG/ACT inhaler Inhale 2 puffs into the lungs every 6 (six) hours as needed for wheezing or shortness of breath. 03/13/22   Arnette Felts, FNP  cyclobenzaprine (FLEXERIL) 10 MG tablet Take 1 tablet (10 mg total) by mouth 3 (three) times daily as needed for muscle spasms. 09/15/22   Arnette Felts, FNP  hydrochlorothiazide (HYDRODIURIL) 12.5 MG tablet TAKE 1 TABLET(12.5 MG) BY MOUTH DAILY 08/13/22   Arnette Felts, FNP  ibuprofen (ADVIL) 800 MG tablet Take 1 tablet (800 mg total) by mouth every 8 (eight) hours as needed. 04/16/22   Arnette Felts, FNP  meloxicam (MOBIC) 15 MG tablet TAKE 1/2 TO 1 TABLET BY MOUTH DAILY AS NEEDED FOR PAIN 09/11/22   Arnette Felts, FNP  ondansetron (ZOFRAN) 4 MG tablet Take 1 tablet (4 mg total) by mouth daily as needed for nausea or  vomiting. 08/13/22 08/13/23  Arnette Felts, FNP  traZODone (DESYREL) 50 MG tablet Take 1 tablet (50 mg total) by mouth at bedtime as needed for sleep. 04/26/21   Ghumman, Ramandeep, NP  triamcinolone cream (KENALOG) 0.5 % Apply 1 Application topically 3 (three) times daily. 10/01/22   Arnette Felts, FNP  cetirizine (ZYRTEC) 10 MG tablet Take 10 mg by mouth daily.  10/13/19  [provider]    Family History Family History  Problem Relation Age of Onset   Diabetes Mother    Liver disease Mother    Hypertension Mother     Social History Social History   Tobacco Use   Smoking status: Former   Smokeless tobacco: Never  Building services engineer Use: Never used  Substance Use Topics   Alcohol use: No   Drug use: No     Allergies   Latex   Review of Systems As stated in HPI otherwise negative   Physical  Exam Triage Vital Signs ED Triage Vitals  Enc Vitals Group     BP 10/17/22 1807 (!) 137/94     Pulse Rate 10/17/22 1807 98     Resp 10/17/22 1807 20     Temp 10/17/22 1807 100.2 F (37.9 C)     Temp Source 10/17/22 1807 Oral     SpO2 10/17/22 1807 100 %     Weight --      Height --      Head Circumference --      Peak Flow --      Pain Score 10/17/22 1806 8     Pain Loc --      Pain Edu? --      Excl. in GC? --    No data found.  Updated Vital Signs BP (!) 137/94 (BP Location: Right Arm)   Pulse 98   Temp 100.2 F (37.9 C) (Oral)   Resp 20   LMP 10/07/2022   SpO2 100%   Visual Acuity Right Eye Distance:   Left Eye Distance:   Bilateral Distance:    Right Eye Near:   Left Eye Near:    Bilateral Near:     Physical Exam Constitutional:      General: She is not in acute distress.    Appearance: She is well-developed. She is not toxic-appearing.     Comments: Appears to feel unwell but in no acute distress  HENT:     Mouth/Throat:     Mouth: Mucous membranes are moist.     Pharynx: Oropharynx is clear.  Eyes:     Extraocular Movements: Extraocular movements intact.     Pupils: Pupils are equal, round, and reactive to light.  Cardiovascular:     Rate and Rhythm: Normal rate and regular rhythm.     Heart sounds: No murmur heard.    No friction rub. No gallop.  Pulmonary:     Effort: Pulmonary effort is normal.     Breath sounds: Normal breath sounds. No wheezing, rhonchi or rales.  Abdominal:     General: Bowel sounds are normal.     Palpations: Abdomen is soft.  Musculoskeletal:     Cervical back: Normal range of motion and neck supple. No rigidity.  Lymphadenopathy:     Cervical: No cervical adenopathy.  Skin:    General: Skin is warm and dry.  Neurological:     Mental Status: She is alert.  Psychiatric:        Mood and Affect: Mood normal.  Behavior: Behavior normal.      UC Treatments / Results  Labs (all labs ordered are listed, but  only abnormal results are displayed) Labs Reviewed  RESP PANEL BY RT-PCR (FLU A&B, COVID) ARPGX2  POC INFLUENZA A AND B ANTIGEN (URGENT CARE ONLY)    EKG   Radiology No results found.  Procedures Procedures (including critical care time)  Medications Ordered in UC Medications  ibuprofen (ADVIL) tablet 800 mg (800 mg Oral Given 10/17/22 1831)    Initial Impression / Assessment and Plan / UC Course  I have reviewed the triage vital signs and the nursing notes.  Pertinent labs & imaging results that were available during my care of the patient were reviewed by me and considered in my medical decision making (see chart for details).  Acute viral illness -VSS and patient nontoxic-appearing.  No evidence for bacterial infection requiring antibiotics at this time.  Rapid flu is negative we will send for PCR along with COVID testing.  If you PCR is positive she is well within the window for Tamiflu. -Rest, increase fluids, Motrin and/or Tylenol as needed for fever and bodyaches -Strict follow-up precautions discussed  Reviewed expections re: course of current medical issues. Questions answered. Outlined signs and symptoms indicating need for more acute intervention. Pt verbalized understanding. AVS given  Final Clinical Impressions(s) / UC Diagnoses   Final diagnoses:  Viral URI     Discharge Instructions      Your symptoms are most likely viral in nature and antibiotics are not necessary at this time.  Your rapid flu test was negative but I have sent off flu and COVID PCR testing.  Those results should be available in the next couple hours.  If you are positive for flu I will call him Tamiflu and notify you when this is done.  Symptomatic treatment as discussed. Hot steamy shower or humidifier to break up congestion. Saline nasal spray to keep nasal passages moisturized. Increase oral fluid intake to keep mucous thin. Acetaminophen and/or Ibuprofen as directed for fever or body  aches. Return to clinic or follow-up is symptoms are not improving or for any worsening symptoms.       ED Prescriptions   None    PDMP not reviewed this encounter.   Rolla Etienne, NP 10/17/22 2140

## 2022-10-20 ENCOUNTER — Encounter: Payer: Self-pay | Admitting: Nurse Practitioner

## 2022-10-20 ENCOUNTER — Ambulatory Visit (INDEPENDENT_AMBULATORY_CARE_PROVIDER_SITE_OTHER): Payer: 59 | Admitting: Nurse Practitioner

## 2022-10-20 ENCOUNTER — Ambulatory Visit (INDEPENDENT_AMBULATORY_CARE_PROVIDER_SITE_OTHER): Payer: Self-pay | Admitting: Podiatry

## 2022-10-20 VITALS — BP 130/70 | HR 92 | Temp 98.2°F | Ht 59.0 in | Wt 216.0 lb

## 2022-10-20 DIAGNOSIS — Z6841 Body Mass Index (BMI) 40.0 and over, adult: Secondary | ICD-10-CM | POA: Diagnosis not present

## 2022-10-20 DIAGNOSIS — R051 Acute cough: Secondary | ICD-10-CM | POA: Diagnosis not present

## 2022-10-20 DIAGNOSIS — Z91199 Patient's noncompliance with other medical treatment and regimen due to unspecified reason: Secondary | ICD-10-CM

## 2022-10-20 DIAGNOSIS — J0120 Acute ethmoidal sinusitis, unspecified: Secondary | ICD-10-CM | POA: Diagnosis not present

## 2022-10-20 LAB — POC INFLUENZA A&B (BINAX/QUICKVUE)
Influenza A, POC: NEGATIVE
Influenza B, POC: NEGATIVE

## 2022-10-20 LAB — POC COVID19 BINAXNOW: SARS Coronavirus 2 Ag: NEGATIVE

## 2022-10-20 MED ORDER — AMOXICILLIN-POT CLAVULANATE 875-125 MG PO TABS: 1.0000 | ORAL_TABLET | Freq: Two times a day (BID) | ORAL | 0 refills | Status: DC

## 2022-10-20 NOTE — Progress Notes (Signed)
I,Tianna Badgett,acting as a Neurosurgeon for [employer], FNP.,have documented all relevant documentation on the behalf of Arnette Felts, FNP,as directed by  Arnette Felts, FNP while in the presence of Arnette Felts, FNP.  Subjective:     Patient ID: Deborah Jacobson , female    DOB: September 18, 1981 , 41 y.o.   MRN: [id number]   Chief Complaint  Patient presents with   Cough    HPI  Patient presents today for cough. Had fever of 100.2. was to take ibuprofen every 6 hours. She has pressure to the right side of her face down to her neck. Runny nose and congestion. She does have a sense of taste and smell. She is not using any nasal sprays and has taken Tylenol sinus. She was tested for the flu and covid.   Cough This is a new problem. The current episode started in the past 7 days. The problem has been gradually worsening. The cough is Productive of sputum. Associated symptoms include chills, headaches and nasal congestion. Pertinent negatives include no rhinorrhea. Associated symptoms comments: Green mucous producation. She has tried OTC cough suppressant for the symptoms. There is no history of asthma.     Past Medical History:  Diagnosis Date   Acid reflux    Diabetes in pregnancy    Hypertension    Seasonal allergies      Family History  Problem Relation Age of Onset   Diabetes Mother    Liver disease Mother    Hypertension Mother      Current Outpatient Medications:    amoxicillin-clavulanate (AUGMENTIN) 875-125 MG tablet, Take 1 tablet by mouth 2 (two) times daily., Disp: 20 tablet, Rfl: 0   albuterol (VENTOLIN HFA) 108 (90 Base) MCG/ACT inhaler, Inhale 2 puffs into the lungs every 6 (six) hours as needed for wheezing or shortness of breath., Disp: 6.7 g, Rfl: 1   cyclobenzaprine (FLEXERIL) 10 MG tablet, Take 1 tablet (10 mg total) by mouth 3 (three) times daily as needed for muscle spasms., Disp: 30 tablet, Rfl: 0   hydrochlorothiazide (HYDRODIURIL) 12.5 MG tablet, TAKE 1 TABLET(12.5  MG) BY MOUTH DAILY, Disp: 90 tablet, Rfl: 1   ibuprofen (ADVIL) 800 MG tablet, Take 1 tablet (800 mg total) by mouth every 8 (eight) hours as needed., Disp: 30 tablet, Rfl: 0   meloxicam (MOBIC) 15 MG tablet, TAKE 1/2 TO 1 TABLET BY MOUTH DAILY AS NEEDED FOR PAIN, Disp: 30 tablet, Rfl: 3   ondansetron (ZOFRAN) 4 MG tablet, Take 1 tablet (4 mg total) by mouth daily as needed for nausea or vomiting., Disp: 30 tablet, Rfl: 1   traZODone (DESYREL) 50 MG tablet, Take 1 tablet (50 mg total) by mouth at bedtime as needed for sleep., Disp: 30 tablet, Rfl: 0   triamcinolone cream (KENALOG) 0.5 %, Apply 1 Application topically 3 (three) times daily., Disp: 30 g, Rfl: 1   Allergies  Allergen Reactions   Latex Hives    Powder from gloves     Review of Systems  Constitutional:  Positive for chills.  HENT:  Positive for congestion and sinus pressure. Negative for rhinorrhea.   Respiratory:  Positive for cough.   Cardiovascular: Negative.   Gastrointestinal: Negative.   Neurological:  Positive for headaches.  Psychiatric/Behavioral: Negative.       Today's Vitals   10/20/22 0853  BP: 130/70  Pulse: 92  Temp: 98.2 F (36.8 C)  TempSrc: Oral  Weight: 216 lb (98 kg)  Height: 4\' 11"  (1.499 m)  Body mass index is 43.63 kg/m.   Objective:  Physical Exam Vitals reviewed.  Constitutional:      General: She is not in acute distress.    Appearance: Normal appearance.  HENT:     Head: Normocephalic and atraumatic.     Right Ear: Tympanic membrane, ear canal and external ear normal. There is no impacted cerumen.     Left Ear: Tympanic membrane, ear canal and external ear normal. There is no impacted cerumen.     Nose: Congestion present.     Comments: Bilateral turbinates are erythematous    Mouth/Throat:     Mouth: Mucous membranes are moist.  Eyes:     Extraocular Movements: Extraocular movements intact.     Conjunctiva/sclera: Conjunctivae normal.     Pupils: Pupils are equal, round,  and reactive to light.  Cardiovascular:     Rate and Rhythm: Normal rate and regular rhythm.     Pulses: Normal pulses.     Heart sounds: Normal heart sounds. No murmur heard. Pulmonary:     Effort: Pulmonary effort is normal. No respiratory distress.     Breath sounds: Normal breath sounds. No wheezing.  Neurological:     Mental Status: She is alert.         Assessment And Plan:     1. Acute cough Comments: Continue over the counter cough syrup as needed. Encouraged to stay well hydrated with water. Return to work 10/21/2022 - POC Influenza A&B (Binax test) - POC COVID-19 - amoxicillin-clavulanate (AUGMENTIN) 875-125 MG tablet; Take 1 tablet by mouth 2 (two) times daily.  Dispense: 20 tablet; Refill: 0 - Novel Coronavirus, NAA ([facility])  2. Acute non-recurrent ethmoidal sinusitis Comments: Tenderness to right ethmoid sinus and right preauricular area. Will treat for sinus infection. Rapid covid and influenza A/B are both negative. - amoxicillin-clavulanate (AUGMENTIN) 875-125 MG tablet; Take 1 tablet by mouth 2 (two) times daily.  Dispense: 20 tablet; Refill: 0 - Novel Coronavirus, NAA ([facility])  3. Morbid obesity with BMI of 45.0-49.9, adult ([place]) She is encouraged to strive for BMI less than 30 to decrease cardiac risk. Advised to aim for at least 150 minutes of exercise per week.   Patient was given opportunity to ask questions. Patient verbalized understanding of the plan and was able to repeat key elements of the plan. All questions were answered to their satisfaction.  Minette Brine, FNP   I, Minette Brine, FNP, have reviewed all documentation for this visit. The documentation on 10/20/22 for the exam, diagnosis, procedures, and orders are all accurate and complete.   IF YOU HAVE BEEN REFERRED TO A SPECIALIST, IT MAY TAKE 1-2 WEEKS TO SCHEDULE/PROCESS THE REFERRAL. IF YOU HAVE NOT HEARD FROM US/SPECIALIST IN TWO WEEKS, PLEASE GIVE [place] A CALL AT [phone].   THE  PATIENT IS ENCOURAGED TO PRACTICE SOCIAL DISTANCING DUE TO THE COVID-19 PANDEMIC.

## 2022-10-20 NOTE — Patient Instructions (Addendum)

## 2022-10-20 NOTE — Progress Notes (Signed)
   Complete physical exam  Patient: Deborah Jacobson   DOB: 08/30/2000   41 y.o. Female  MRN: [id number]  Subjective:    No chief complaint on file.   Deborah Jacobson is a 41 y.o. female who presents today for a complete physical exam. She reports consuming a {diet types:17450} diet. {types:19826} She generally feels {DESC; WELL/FAIRLY WELL/POORLY:18703}. She reports sleeping {DESC; WELL/FAIRLY WELL/POORLY:18703}. She {does/does not:200015} have additional problems to discuss today.    Most recent fall risk assessment:    05/07/2022   10:42 AM  Fall Risk   Falls in the past year? 0  Number falls in past yr: 0  Injury with Fall? 0  Risk for fall due to : No Fall Risks  Follow up Falls evaluation completed     Most recent depression screenings:    05/07/2022   10:42 AM 03/28/2021   10:46 AM  PHQ 2/9 Scores  PHQ - 2 Score 0 0  PHQ- 9 Score 5     {VISON DENTAL STD PSA (Optional):27386}  {History (Optional):23778}  Patient Care Team: Jessup, Joy, NP as PCP - General (Nurse Practitioner)   Outpatient Medications Prior to Visit  Medication Sig   fluticasone (FLONASE) 50 MCG/ACT nasal spray Place 2 sprays into both nostrils in the morning and at bedtime. After 7 days, reduce to once daily.   norgestimate-ethinyl estradiol (SPRINTEC 28) 0.25-35 MG-MCG tablet Take 1 tablet by mouth daily.   Nystatin POWD Apply liberally to affected area 2 times per day   spironolactone (ALDACTONE) 100 MG tablet Take 1 tablet (100 mg total) by mouth daily.   No facility-administered medications prior to visit.    ROS        Objective:     There were no vitals taken for this visit. {Vitals History (Optional):23777}  Physical Exam   No results found for any visits on 06/12/22. {Show previous labs (optional):23779}    Assessment & Plan:    Routine Health Maintenance and Physical Exam  Immunization History  Administered Date(s) Administered   DTaP 11/13/1999, 01/09/2000,  03/19/2000, 12/03/2000, 06/18/2004   Hepatitis A 04/14/2008, 04/20/2009   Hepatitis B 08/31/1999, 10/08/1999, 03/19/2000   HiB (PRP-OMP) 11/13/1999, 01/09/2000, 03/19/2000, 12/03/2000   IPV 11/13/1999, 01/09/2000, 09/07/2000, 06/18/2004   Influenza,inj,Quad PF,6+ Mos 07/21/2014   Influenza-Unspecified 10/20/2012   MMR 09/07/2001, 06/18/2004   Meningococcal Polysaccharide 04/19/2012   Pneumococcal Conjugate-13 12/03/2000   Pneumococcal-Unspecified 03/19/2000, 06/02/2000   Tdap 04/19/2012   Varicella 09/07/2000, 04/14/2008    Health Maintenance  Topic Date Due   HIV Screening  Never done   Hepatitis C Screening  Never done   INFLUENZA VACCINE  06/10/2022   PAP-Cervical Cytology Screening  06/12/2022 (Originally 08/29/2020)   PAP SMEAR-Modifier  06/12/2022 (Originally 08/29/2020)   TETANUS/TDAP  06/12/2022 (Originally 04/19/2022)   HPV VACCINES  Discontinued   COVID-19 Vaccine  Discontinued    Discussed health benefits of physical activity, and encouraged her to engage in regular exercise appropriate for her age and condition.  Problem List Items Addressed This Visit   None Visit Diagnoses     Annual physical exam    -  Primary   Cervical cancer screening       Need for Tdap vaccination          No follow-ups on file.     Joy Jessup, NP   

## 2022-10-24 LAB — SPECIMEN STATUS REPORT

## 2022-10-24 LAB — NOVEL CORONAVIRUS, NAA

## 2022-10-27 ENCOUNTER — Other Ambulatory Visit: Payer: Self-pay | Admitting: Nurse Practitioner

## 2022-10-28 ENCOUNTER — Encounter: Payer: Self-pay | Admitting: Nurse Practitioner

## 2022-10-28 ENCOUNTER — Other Ambulatory Visit: Payer: Self-pay | Admitting: Nurse Practitioner

## 2022-10-28 DIAGNOSIS — R051 Acute cough: Secondary | ICD-10-CM

## 2022-11-06 ENCOUNTER — Other Ambulatory Visit: Payer: Self-pay | Admitting: Nurse Practitioner

## 2022-11-06 DIAGNOSIS — J0120 Acute ethmoidal sinusitis, unspecified: Secondary | ICD-10-CM

## 2022-11-06 DIAGNOSIS — R051 Acute cough: Secondary | ICD-10-CM

## 2022-11-06 MED ORDER — AMOXICILLIN-POT CLAVULANATE 875-125 MG PO TABS: 1.0000 | ORAL_TABLET | Freq: Two times a day (BID) | ORAL | 0 refills | Status: DC

## 2022-11-11 ENCOUNTER — Encounter: Payer: Self-pay | Admitting: Podiatry

## 2022-11-11 ENCOUNTER — Ambulatory Visit (INDEPENDENT_AMBULATORY_CARE_PROVIDER_SITE_OTHER): Payer: 59

## 2022-11-11 ENCOUNTER — Ambulatory Visit: Payer: 59 | Admitting: Podiatry

## 2022-11-11 DIAGNOSIS — M7752 Other enthesopathy of left foot: Secondary | ICD-10-CM | POA: Diagnosis not present

## 2022-11-11 DIAGNOSIS — M722 Plantar fascial fibromatosis: Secondary | ICD-10-CM | POA: Diagnosis not present

## 2022-11-11 DIAGNOSIS — R52 Pain, unspecified: Secondary | ICD-10-CM | POA: Diagnosis not present

## 2022-11-11 MED ORDER — BETAMETHASONE SOD PHOS & ACET 6 (3-3) MG/ML IJ SUSP
3.0000 mg | Freq: Once | INTRAMUSCULAR | Status: AC
  Administered 2022-11-11: 3 mg via INTRA_ARTICULAR

## 2022-11-11 NOTE — Progress Notes (Signed)
   Chief Complaint  Patient presents with   Foot Problem    Pain located in bilateral feet, PF     HPI: 42 y.o. female presenting today for new complaint of pain and tenderness associated to the left great toe joint that has been ongoing for few months now.  Patient denies a history of injury.  Gradual onset.  Aggravated by walking and prolonged periods of standing.  She says that she experiences throbbing sensation to the great toe joint.  Patient also has a longstanding history of plantar fasciitis bilateral intermittently over the last 10 years.  Presenting for further treatment and evaluation  Past Medical History:  Diagnosis Date   Acid reflux    Diabetes in pregnancy    Hypertension    Seasonal allergies     Past Surgical History:  Procedure Laterality Date   CESAREAN SECTION     SHOULDER ARTHROSCOPY WITH BICEPS TENDON REPAIR Left 08/29/2020   Procedure: LEFT SHOULDER ARTHROSCOPY WITH BICEPS TENODESIS;  Surgeon: Leandrew Koyanagi, MD;  Location: [facility];  Service: Orthopedics;  Laterality: Left;   TUBAL LIGATION      Allergies  Allergen Reactions   Latex Hives    Powder from gloves     Physical Exam: General: The patient is alert and oriented x3 in no acute distress.  Dermatology: Skin is warm, dry and supple bilateral lower extremities. Negative for open lesions or macerations.  Vascular: Palpable pedal pulses bilaterally. Capillary refill within normal limits.  Negative for any significant edema or erythema  Neurological: Light touch and protective threshold grossly intact  Musculoskeletal Exam: No pedal deformities noted.  There is pain on palpation and range of motion of the first MTP left foot  Radiographic Exam B/L feet 11/11/2022:  Normal osseous mineralization. Joint spaces preserved. No fracture/dislocation/boney destruction.  Plantar heel spurs noted bilateral.  Bipartite sesamoid noted left tibial sesamoid  Assessment: 1.  Chronic h/o plantar  fasciitis bilateral 2.  First MTP capsulitis left 3.  Bipartite tibial sesamoid left   Plan of Care:  1. Patient evaluated. X-Rays reviewed.  2.  Injection of 0.5 cc Celestone Soluspan injected in the first MTP left 3.  Recommend wide fitting shoes that do not constrict or irritate the forefoot 4.  Recommend OTC arch supports.  Patient states that they do help 5.  Return to clinic as needed      Edrick Kins, DPM [facility]  Dr. Edrick Kins, DPM    [address], [place] [address]                Office [phone]  Fax [phone]

## 2022-11-12 ENCOUNTER — Other Ambulatory Visit: Payer: Self-pay | Admitting: Nurse Practitioner

## 2022-11-12 NOTE — Telephone Encounter (Signed)
Patient is a off work note for 01/03-01/04, still having discomfort from injection,please send to email address on file. Please advise.

## 2022-11-20 ENCOUNTER — Encounter: Payer: Self-pay | Admitting: Nurse Practitioner

## 2022-12-01 ENCOUNTER — Other Ambulatory Visit: Payer: Self-pay | Admitting: Nurse Practitioner

## 2022-12-01 DIAGNOSIS — R112 Nausea with vomiting, unspecified: Secondary | ICD-10-CM

## 2022-12-01 MED ORDER — ONDANSETRON HCL 4 MG PO TABS: 4.0000 mg | ORAL_TABLET | Freq: Every day | ORAL | 1 refills | Status: DC | PRN

## 2022-12-17 ENCOUNTER — Encounter: Payer: Self-pay | Admitting: Nurse Practitioner

## 2022-12-17 ENCOUNTER — Ambulatory Visit (INDEPENDENT_AMBULATORY_CARE_PROVIDER_SITE_OTHER): Payer: 59 | Admitting: Nurse Practitioner

## 2022-12-17 VITALS — BP 132/88 | HR 87 | Temp 98.0°F | Ht 59.0 in | Wt 224.0 lb

## 2022-12-17 DIAGNOSIS — Z20822 Contact with and (suspected) exposure to covid-19: Secondary | ICD-10-CM | POA: Diagnosis not present

## 2022-12-17 DIAGNOSIS — G4700 Insomnia, unspecified: Secondary | ICD-10-CM | POA: Diagnosis not present

## 2022-12-17 DIAGNOSIS — R5383 Other fatigue: Secondary | ICD-10-CM | POA: Diagnosis not present

## 2022-12-17 DIAGNOSIS — Z6841 Body Mass Index (BMI) 40.0 and over, adult: Secondary | ICD-10-CM | POA: Diagnosis not present

## 2022-12-17 DIAGNOSIS — R7303 Prediabetes: Secondary | ICD-10-CM

## 2022-12-17 DIAGNOSIS — R110 Nausea: Secondary | ICD-10-CM | POA: Diagnosis not present

## 2022-12-17 DIAGNOSIS — I10 Essential (primary) hypertension: Secondary | ICD-10-CM | POA: Diagnosis not present

## 2022-12-17 LAB — POC COVID19 BINAXNOW: SARS Coronavirus 2 Ag: NEGATIVE

## 2022-12-17 MED ORDER — TRAZODONE HCL 50 MG PO TABS: 50.0000 mg | ORAL_TABLET | Freq: Every evening | ORAL | 2 refills | Status: DC | PRN

## 2022-12-17 NOTE — Patient Instructions (Signed)
Hypertension, Adult High blood pressure (hypertension) is when the force of blood pumping through the arteries is too strong. The arteries are the blood vessels that carry blood from the heart throughout the body. Hypertension forces the heart to work harder to pump blood and may cause arteries to become narrow or stiff. Untreated or uncontrolled hypertension can lead to a heart attack, heart failure, a stroke, kidney disease, and other problems. A blood pressure reading consists of a higher number over a lower number. Ideally, your blood pressure should be below 120/80. The first ("top") number is called the systolic pressure. It is a measure of the pressure in your arteries as your heart beats. The second ("bottom") number is called the diastolic pressure. It is a measure of the pressure in your arteries as the heart relaxes. What are the causes? The exact cause of this condition is not known. There are some conditions that result in high blood pressure. What increases the risk? Certain factors may make you more likely to develop high blood pressure. Some of these risk factors are under your control, including: Smoking. Not getting enough exercise or physical activity. Being overweight. Having too much fat, sugar, calories, or salt (sodium) in your diet. Drinking too much alcohol. Other risk factors include: Having a personal history of heart disease, diabetes, high cholesterol, or kidney disease. Stress. Having a family history of high blood pressure and high cholesterol. Having obstructive sleep apnea. Age. The risk increases with age. What are the signs or symptoms? High blood pressure may not cause symptoms. Very high blood pressure (hypertensive crisis) may cause: Headache. Fast or irregular heartbeats (palpitations). Shortness of breath. Nosebleed. Nausea and vomiting. Vision changes. Severe chest pain, dizziness, and seizures. How is this diagnosed? This condition is diagnosed by  measuring your blood pressure while you are seated, with your arm resting on a flat surface, your legs uncrossed, and your feet flat on the floor. The cuff of the blood pressure monitor will be placed directly against the skin of your upper arm at the level of your heart. Blood pressure should be measured at least twice using the same arm. Certain conditions can cause a difference in blood pressure between your right and left arms. If you have a high blood pressure reading during one visit or you have normal blood pressure with other risk factors, you may be asked to: Return on a different day to have your blood pressure checked again. Monitor your blood pressure at home for 1 week or longer. If you are diagnosed with hypertension, you may have other blood or imaging tests to help your health care provider understand your overall risk for other conditions. How is this treated? This condition is treated by making healthy lifestyle changes, such as eating healthy foods, exercising more, and reducing your alcohol intake. You may be referred for counseling on a healthy diet and physical activity. Your health care provider may prescribe medicine if lifestyle changes are not enough to get your blood pressure under control and if: Your systolic blood pressure is above 130. Your diastolic blood pressure is above 80. Your personal target blood pressure may vary depending on your medical conditions, your age, and other factors. Follow these instructions at home: Eating and drinking  Eat a diet that is high in fiber and potassium, and low in sodium, added sugar, and fat. An example of this eating plan is called the DASH diet. DASH stands for Dietary Approaches to Stop Hypertension. To eat this way: Eat   plenty of fresh fruits and vegetables. Try to fill one half of your plate at each meal with fruits and vegetables. Eat whole grains, such as whole-wheat pasta, brown rice, or whole-grain bread. Fill about one  fourth of your plate with whole grains. Eat or drink low-fat dairy products, such as skim milk or low-fat yogurt. Avoid fatty cuts of meat, processed or cured meats, and poultry with skin. Fill about one fourth of your plate with lean proteins, such as fish, chicken without skin, beans, eggs, or tofu. Avoid pre-made and processed foods. These tend to be higher in sodium, added sugar, and fat. Reduce your daily sodium intake. Many people with hypertension should eat less than 1,500 mg of sodium a day. Do not drink alcohol if: Your health care provider tells you not to drink. You are pregnant, may be pregnant, or are planning to become pregnant. If you drink alcohol: Limit how much you have to: 0-1 drink a day for women. 0-2 drinks a day for men. Know how much alcohol is in your drink. In the [place] one drink equals one 12 oz bottle of beer (355 mL), one 5 oz glass of wine (148 mL), or one 1 oz glass of hard liquor (44 mL). Lifestyle  Work with your health care provider to maintain a healthy body weight or to lose weight. Ask what an ideal weight is for you. Get at least 30 minutes of exercise that causes your heart to beat faster (aerobic exercise) most days of the week. Activities may include walking, swimming, or biking. Include exercise to strengthen your muscles (resistance exercise), such as Pilates or lifting weights, as part of your weekly exercise routine. Try to do these types of exercises for 30 minutes at least 3 days a week. Do not use any products that contain nicotine or tobacco. These products include cigarettes, chewing tobacco, and vaping devices, such as e-cigarettes. If you need help quitting, ask your health care provider. Monitor your blood pressure at home as told by your health care provider. Keep all follow-up visits. This is important. Medicines Take over-the-counter and prescription medicines only as told by your health care provider. Follow directions carefully. Blood  pressure medicines must be taken as prescribed. Do not skip doses of blood pressure medicine. Doing this puts you at risk for problems and can make the medicine less effective. Ask your health care provider about side effects or reactions to medicines that you should watch for. Contact a health care provider if you: Think you are having a reaction to a medicine you are taking. Have headaches that keep coming back (recurring). Feel dizzy. Have swelling in your ankles. Have trouble with your vision. Get help right away if you: Develop a severe headache or confusion. Have unusual weakness or numbness. Feel faint. Have severe pain in your chest or abdomen. Vomit repeatedly. Have trouble breathing. These symptoms may be an emergency. Get help right away. Call 911. Do not wait to see if the symptoms will go away. Do not drive yourself to the hospital. Summary Hypertension is when the force of blood pumping through your arteries is too strong. If this condition is not controlled, it may put you at risk for serious complications. Your personal target blood pressure may vary depending on your medical conditions, your age, and other factors. For most people, a normal blood pressure is less than 120/80. Hypertension is treated with lifestyle changes, medicines, or a combination of both. Lifestyle changes include losing weight, eating a healthy,   low-sodium diet, exercising more, and limiting alcohol. This information is not intended to replace advice given to you by your health care provider. Make sure you discuss any questions you have with your health care provider. Document Revised: 09/03/2021 Document Reviewed: 09/03/2021 Elsevier Patient Education  2023 Elsevier Inc.  

## 2022-12-17 NOTE — Progress Notes (Signed)
I,Sheena Holbrook,acting as a Education administrator for Minette Brine, FNP.,have documented all relevant documentation on the behalf of Minette Brine, FNP,as directed by  Minette Brine, FNP while in the presence of Minette Brine, [place].    Subjective:     Patient ID: Deborah Jacobson , female    DOB: [birth year] , 42 y.o.   MRN: [id number]   Chief Complaint  Patient presents with   Follow-up    Blood pressure, does take at home sometimes and has been normal, reports her stress level has decreased    HPI  Patient presents today for blood pressure follow up. Patient does take her blood pressure at home occasionally, reports it has been normal. She states her stress levels have decreased significantly.   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 She has been having nausea and fatigue. She has been tested twice at work since Thursday which were negative   Wt Readings from Last 3 Encounters: 12/17/22 : 224 lb (101.6 kg) 10/20/22 : 216 lb (98 kg) 10/01/22 : 218 lb (98.9 kg)    Hypertension This is a chronic problem. The current episode started more than 1 year ago. The problem is unchanged. The problem is controlled. Pertinent negatives include no anxiety, chest pain or palpitations. There are no associated agents to hypertension. Risk factors for coronary artery disease include obesity and sedentary lifestyle. Past treatments include diuretics. There are no compliance problems.  There is no history of angina or kidney disease. There is no history of chronic renal disease.     Past Medical History:  Diagnosis Date   Acid reflux    Diabetes in pregnancy    Hypertension    Seasonal allergies      Family History  Problem Relation Age of Onset   Diabetes Mother    Liver disease Mother    Hypertension Mother      Current Outpatient Medications:    albuterol (VENTOLIN HFA) 108 (90 Base) MCG/ACT inhaler, Inhale 2 puffs into the lungs every 6 (six)  hours as needed for wheezing or shortness of breath., Disp: 6.7 g, Rfl: 1   cyclobenzaprine (FLEXERIL) 10 MG tablet, TAKE 1 TABLET BY MOUTH THREE TIMES A DAY AS NEEDED FOR MUSCLE SPASMS, Disp: 30 tablet, Rfl: 0   diclofenac Sodium (VOLTAREN) 1 % GEL, APPLY 2 GRAMS TOPICALLY TO THE AFFECTED AREA FOUR TIMES DAILY, Disp: 100 g, Rfl: 2   hydrochlorothiazide (HYDRODIURIL) 12.5 MG tablet, TAKE 1 TABLET(12.5 MG) BY MOUTH DAILY, Disp: 90 tablet, Rfl: 1   ibuprofen (ADVIL) 800 MG tablet, Take 1 tablet (800 mg total) by mouth every 8 (eight) hours as needed., Disp: 30 tablet, Rfl: 0   Ibuprofen-diphenhydrAMINE Cit 200-38 MG TABS, Take by mouth., Disp: , Rfl:    meloxicam (MOBIC) 15 MG tablet, TAKE 1/2 TO 1 TABLET BY MOUTH DAILY AS NEEDED FOR PAIN, Disp: 30 tablet, Rfl: 3   ondansetron (ZOFRAN) 4 MG tablet, Take 1 tablet (4 mg total) by mouth daily as needed for nausea or vomiting., Disp: 30 tablet, Rfl: 1   triamcinolone cream (KENALOG) 0.5 %, Apply 1 Application topically 3 (three) times daily., Disp: 30 g, Rfl: 1   traZODone (DESYREL) 50 MG tablet, Take 1 tablet (50 mg total) by mouth at bedtime as needed for sleep., Disp: 30 tablet, Rfl: 2   Allergies  Allergen Reactions   Latex Hives    Powder from gloves  Review of Systems  Constitutional: Negative.   Respiratory: Negative.  Negative for cough.   Cardiovascular: Negative.  Negative for chest pain, palpitations and leg swelling.  Musculoskeletal:  Negative for myalgias.  Neurological:  Negative for dizziness.  Psychiatric/Behavioral: Negative.       Today's Vitals   12/17/22 0930  BP: 132/88  Pulse: 87  Temp: 98 F (36.7 C)  TempSrc: Oral  SpO2: 98%  Weight: 224 lb (101.6 kg)  Height: 4\' 11"  (1.499 m)   Body mass index is 45.24 kg/m.   Objective:  Physical Exam Vitals reviewed.  Constitutional:      General: She is not in acute distress.    Appearance: Normal appearance. She is well-developed. She is obese.  Cardiovascular:      Rate and Rhythm: Normal rate and regular rhythm.     Pulses: Normal pulses.     Heart sounds: Normal heart sounds. No murmur heard. Pulmonary:     Effort: Pulmonary effort is normal. No respiratory distress.     Breath sounds: Normal breath sounds. No wheezing.  Chest:     Chest wall: No tenderness.  Musculoskeletal:        General: No swelling or tenderness. Normal range of motion.  Skin:    General: Skin is warm and dry.     Capillary Refill: Capillary refill takes less than 2 seconds.  Neurological:     General: No focal deficit present.     Mental Status: She is alert and oriented to person, place, and time.     Cranial Nerves: No cranial nerve deficit.     Motor: No weakness.  Psychiatric:        Mood and Affect: Mood normal.        Behavior: Behavior normal.        Thought Content: Thought content normal.        Judgment: Judgment normal.         Assessment And Plan:     1. Essential hypertension Comments: Blood pressure is slightly elevated, encouraged to continue with a low salt diet. - BMP8+eGFR  2. Prediabetes Comments: Stable, no current medications. Encouraged to increase physical activity by starting slow and increasing gradually - Hemoglobin A1c - BMP8+eGFR  3. Nausea Comments: Continue bland diet. - POC COVID-19 - Respiratory Panel w/ SARS-CoV2  4. Other fatigue Comments: Will check for any metabolic causes. Also checking for covid since she has been exposed at work - POC COVID-19 - Iron, TIBC and Ferritin Panel - CBC  5. Insomnia, unspecified type Comments: Refill for Trazodone - traZODone (DESYREL) 50 MG tablet; Take 1 tablet (50 mg total) by mouth at bedtime as needed for sleep.  Dispense: 30 tablet; Refill: 2  6. Close exposure to COVID-19 virus - POC COVID-19  7. Morbid obesity with BMI of 45.0-49.9, adult ([place]) She is encouraged to strive for BMI less than 30 to decrease cardiac risk. Advised to aim for at least 150 minutes of  exercise per week. Encouraged to park further when at the store, take stairs instead of elevators and to walk in place during commercials.  Increase water intake to at least one gallon of water daily.   Patient was given opportunity to ask questions. Patient verbalized understanding of the plan and was able to repeat key elements of the plan. All questions were answered to their satisfaction.  Minette Brine, FNP   I, Minette Brine, FNP, have reviewed all documentation for this visit. The documentation on 12/17/22 for  the exam, diagnosis, procedures, and orders are all accurate and complete.   IF YOU HAVE BEEN REFERRED TO A SPECIALIST, IT MAY TAKE 1-2 WEEKS TO SCHEDULE/PROCESS THE REFERRAL. IF YOU HAVE NOT HEARD FROM US/SPECIALIST IN TWO WEEKS, PLEASE GIVE [place] A CALL AT [phone] X [phone].   THE PATIENT IS ENCOURAGED TO PRACTICE SOCIAL DISTANCING DUE TO THE COVID-19 PANDEMIC.

## 2022-12-18 ENCOUNTER — Other Ambulatory Visit: Payer: Self-pay | Admitting: Nurse Practitioner

## 2022-12-18 DIAGNOSIS — R7303 Prediabetes: Secondary | ICD-10-CM

## 2022-12-18 DIAGNOSIS — D508 Other iron deficiency anemias: Secondary | ICD-10-CM

## 2022-12-18 LAB — BMP8+EGFR
BUN/Creatinine Ratio: 14 (ref 9–23)
BUN: 12 mg/dL (ref 6–24)
CO2: 24 mmol/L (ref 20–29)
Calcium: 9.5 mg/dL (ref 8.7–10.2)
Chloride: 100 mmol/L (ref 96–106)
Creatinine, Ser: 0.88 mg/dL (ref 0.57–1.00)
Glucose: 92 mg/dL (ref 70–99)
Potassium: 4 mmol/L (ref 3.5–5.2)
Sodium: 142 mmol/L (ref 134–144)
eGFR: 84 mL/min/{1.73_m2} (ref >59)

## 2022-12-18 LAB — CBC
Hematocrit: 36.6 % (ref 34.0–46.6)
Hemoglobin: 11.9 g/dL (ref 11.1–15.9)
MCH: 26.3 pg — ABNORMAL LOW (ref 26.6–33.0)
MCHC: 32.5 g/dL (ref 31.5–35.7)
MCV: 81 fL (ref 79–97)
Platelets: 366 10*3/uL (ref 150–450)
RBC: 4.52 x10E6/uL (ref 3.77–5.28)
RDW: 14.5 % (ref 11.7–15.4)
WBC: 6.2 10*3/uL (ref 3.4–10.8)

## 2022-12-18 LAB — HEMOGLOBIN A1C
Est. average glucose Bld gHb Est-mCnc: 131 mg/dL
Hgb A1c MFr Bld: 6.2 % — ABNORMAL HIGH (ref 4.8–5.6)

## 2022-12-18 LAB — IRON,TIBC AND FERRITIN PANEL
Ferritin: 28 ng/mL (ref 15–150)
Iron Saturation: 8 % — CL (ref 15–55)
Iron: 33 ug/dL (ref 27–159)
Total Iron Binding Capacity: 414 ug/dL (ref 250–450)
UIBC: 381 ug/dL (ref 131–425)

## 2022-12-18 MED ORDER — FERROUS SULFATE 325 (65 FE) MG PO TBEC: 325.0000 mg | DELAYED_RELEASE_TABLET | Freq: Two times a day (BID) | ORAL | 3 refills | Status: DC

## 2022-12-18 MED ORDER — METFORMIN HCL 500 MG PO TABS: 500.0000 mg | ORAL_TABLET | Freq: Two times a day (BID) | ORAL | 3 refills | Status: DC

## 2022-12-18 NOTE — Addendum Note (Signed)
Addended by: Wadie Lessen on: 12/18/2022 03:55 PM   Modules accepted: Orders

## 2022-12-29 ENCOUNTER — Encounter: Payer: Self-pay | Admitting: Obstetrics and Gynecology

## 2022-12-29 ENCOUNTER — Other Ambulatory Visit: Payer: Self-pay

## 2022-12-29 ENCOUNTER — Ambulatory Visit (INDEPENDENT_AMBULATORY_CARE_PROVIDER_SITE_OTHER): Payer: 59 | Admitting: Obstetrics and Gynecology

## 2022-12-29 VITALS — BP 113/80 | HR 83 | Ht 59.0 in | Wt 222.7 lb

## 2022-12-29 DIAGNOSIS — R6882 Decreased libido: Secondary | ICD-10-CM

## 2022-12-29 DIAGNOSIS — R69 Illness, unspecified: Secondary | ICD-10-CM | POA: Diagnosis not present

## 2022-12-29 DIAGNOSIS — Z1231 Encounter for screening mammogram for malignant neoplasm of breast: Secondary | ICD-10-CM | POA: Diagnosis not present

## 2022-12-29 DIAGNOSIS — N926 Irregular menstruation, unspecified: Secondary | ICD-10-CM | POA: Diagnosis not present

## 2022-12-29 NOTE — Progress Notes (Signed)
GYNECOLOGY OFFICE VISIT NOTE  History:   Deborah Jacobson is a 42 y.o. [phone] here today for a couple concerns.   Change in cycles - started to change in October. Hot flashes came on around this time. Cycles usually the 28th but coming later (about 10 days later) and only last 3 days instead of 7. Sometimes they are last 4 days. Deborah Jacobson is not yet menopausal at age 24. Deborah Jacobson had been maybe in her 49s.  Low libido - works 3rd shift and tired. She has no other identifiable impacts on her libido.       Past Medical History:  Diagnosis Date   Acid reflux    Diabetes in pregnancy    Hypertension    Seasonal allergies     Past Surgical History:  Procedure Laterality Date   CESAREAN SECTION     SHOULDER ARTHROSCOPY WITH BICEPS TENDON REPAIR Left 08/29/2020   Procedure: LEFT SHOULDER ARTHROSCOPY WITH BICEPS TENODESIS;  Surgeon: Deborah Koyanagi, MD;  Location: [facility];  Service: Orthopedics;  Laterality: Left;   TUBAL LIGATION      The following portions of the patient's history were reviewed and updated as appropriate: allergies, current medications, past family history, past medical history, past social history, past surgical history and problem list.   Health Maintenance:   Diagnosis  Date Value Ref Range Status  08/12/2021   Final   - Negative for intraepithelial lesion or malignancy (NILM)    Due for mammo.    Review of Systems:  Pertinent items noted in HPI and remainder of comprehensive ROS otherwise negative.  Physical Exam:  BP 113/80   Pulse 83   Ht 4' 11"$  (1.499 m)   Wt 222 lb 11.2 oz (101 kg)   LMP 12/13/2022   Breastfeeding No   BMI 44.98 kg/m  CONSTITUTIONAL: Well-developed, well-nourished female in no acute distress.  HEENT:  Normocephalic, atraumatic. External right and left ear normal. No scleral icterus.  NECK: Normal range of motion, supple, no masses noted on observation SKIN: No rash noted. Not diaphoretic. No erythema. No  pallor. MUSCULOSKELETAL: Normal range of motion. No edema noted. NEUROLOGIC: Alert and oriented to person, place, and time. Normal muscle tone coordination. No cranial nerve deficit noted. PSYCHIATRIC: Normal mood and affect. Normal behavior. Normal judgment and thought content.  PELVIC: Deferred  Labs and Imaging No results found for this or any previous visit (from the past 168 hour(s)). No results found.  Assessment and Plan:   1. Irregular periods Discussed cycle variability will happen as she becomes perimenopausal. Discussed likely perimenopausal based on symptoms but this process can last for several years. Her symptoms at this time she reports are tolerable and she would not want to do medication to intervene with them.  Reviewed precautions for when evaluation of bleeding would be necessary I.e. heavier/closer together.  We discussed if hot flashes worsened and she wanted to do medication at that time, we would have her come in and we would discuss.  She agrees with plan.   2. Low libido - Rx options discussed. She would not like to intervene at this time. Her husband has been very supportive.   3. Encounter for screening mammogram for malignant neoplasm of breast Will scheduel for her and send mychart message.     [name] was seen today for gynecologic exam.  Diagnoses and all orders for this visit:  Irregular periods  Low libido  Encounter for screening mammogram for malignant neoplasm  of breast    Routine preventative health maintenance measures emphasized. Please refer to After Visit Summary for other counseling recommendations.   No follow-ups on file.  Radene Gunning, MD, [place] for [facility], [place]

## 2023-01-20 ENCOUNTER — Ambulatory Visit (INDEPENDENT_AMBULATORY_CARE_PROVIDER_SITE_OTHER): Payer: 59 | Admitting: Orthopaedic Surgery

## 2023-01-20 ENCOUNTER — Other Ambulatory Visit: Payer: Self-pay | Admitting: Podiatry

## 2023-01-20 ENCOUNTER — Ambulatory Visit (INDEPENDENT_AMBULATORY_CARE_PROVIDER_SITE_OTHER): Payer: 59

## 2023-01-20 ENCOUNTER — Encounter: Payer: Self-pay | Admitting: Orthopaedic Surgery

## 2023-01-20 DIAGNOSIS — M542 Cervicalgia: Secondary | ICD-10-CM

## 2023-01-20 DIAGNOSIS — M545 Low back pain, unspecified: Secondary | ICD-10-CM

## 2023-01-20 MED ORDER — PREDNISONE 10 MG (21) PO TBPK: ORAL_TABLET | ORAL | 0 refills | Status: DC

## 2023-01-20 MED ORDER — METHOCARBAMOL 750 MG PO TABS: 750.0000 mg | ORAL_TABLET | Freq: Two times a day (BID) | ORAL | 0 refills | Status: DC | PRN

## 2023-01-20 NOTE — Progress Notes (Signed)
Office Visit Note   Patient: Deborah Jacobson           Date of Birth: May 05, 1981           MRN: [id number] Visit Date: 01/20/2023              Requested by: Minette Brine, [address],   [id number] PCP: Minette Brine, FNP   Assessment & Plan: Visit Diagnoses:  1. Neck pain   2. Acute midline low back pain, unspecified whether sciatica present     Plan: Impression is chronic neck and back pain.  At this point, she does have evidence of arthritis on x-rays.  I would like to start her on a steroid pack and muscle relaxer and send her to outpatient physical therapy.  She is agreeable to this plan.  She will follow-up as needed.  Follow-Up Instructions: Return if symptoms worsen or fail to improve.   Orders:  Orders Placed This Encounter  Procedures   XR Lumbar Spine 2-3 Views   XR Cervical Spine 2 or 3 views   XR Shoulder Left   Ambulatory referral to Physical Therapy   Meds ordered this encounter  Medications   predniSONE (STERAPRED UNI-PAK 21 TAB) 10 MG (21) TBPK tablet    Sig: Take as directed    Dispense:  21 tablet    Refill:  0   methocarbamol (ROBAXIN-750) 750 MG tablet    Sig: Take 1 tablet (750 mg total) by mouth 2 (two) times daily as needed for muscle spasms.    Dispense:  20 tablet    Refill:  0      Procedures: No procedures performed   Clinical Data: No additional findings.   Subjective: Chief Complaint  Patient presents with   Neck - Pain   Lower Back - Pain    HPI patient is a pleasant 42 year old female who comes in today with neck and back pain.  Regards to her neck, she has had pain for the past week.  She denies any injury or change in activity.  The pain she has in the back of the neck which does radiate slightly to the left.  Pain is constant.  No weakness or paresthesias to either upper extremity.  She has increased pain with left-sided rotation of the cervical spine.  She has been taking NSAIDs without  significant relief.  In regards to her back, she has pain to the low back.  Symptoms are worse in the cold.  She has been using a heating pad topical and oral NSAIDs without significant relief.  She denies any paresthesias or weakness to the lower leg.  No bowel or bladder change.  Review of Systems as detailed in HPI.  All others reviewed and are negative.   Objective: Vital Signs: There were no vitals taken for this visit.  Physical Exam well-developed well-nourished female no acute distress.  Alert and oriented x 3.  Ortho Exam examination of the neck reveals no spinous or paraspinous tenderness.  She does have increased pain with neck flexion, extension and rotation.  Lumbar spine exam shows no spinous tenderness.  She does have slight left-sided paraspinous tenderness.  Increased pain with straight leg raise both sides.  No focal weakness.  She is neurovascular intact distally.  Specialty Comments:  No specialty comments available.  Imaging: XR Lumbar Spine 2-3 Views  Result Date: 01/20/2023 Moderate degenerative changes to the lower thoracic and upper lumbar spine  XR Cervical Spine  2 or 3 views  Result Date: 01/20/2023 Advanced multilevel degenerative changes worse at C5-6 C6-7  XR Shoulder Left  Result Date: 01/20/2023 No acute or structural abnormalities    PMFS History: Patient Active Problem List   Diagnosis Date Noted   Essential hypertension 12/17/2022   Insomnia 12/17/2022   Subacromial bursitis of left shoulder joint 08/29/2020   Tendinopathy of left rotator cuff 08/29/2020   Morbid obesity with BMI of 45.0-49.9, adult ([place]) 08/09/2020   Superior glenoid labrum lesion of left shoulder 07/10/2020   Health maintenance examination 03/23/2019   Prediabetes 16-Dec-2018   Strain of back 12-16-2018   Death of family member 16-Dec-2018   Back pain 07/31/2018   Other fatigue 05/31/2018   Chronic low back pain 11/30/2017   Shifting sleep-work schedule 06/15/2017    Paradoxical insomnia 06/15/2017   Sleep related headaches 06/15/2017   Super obese 06/15/2017   Mood complaints in sleep disorder 06/15/2017   Plantar fasciitis of left foot 03/01/2014   Porokeratosis 03/01/2014   Pain in lower limb 03/01/2014   Past Medical History:  Diagnosis Date   Acid reflux    Diabetes in pregnancy    Hypertension    Seasonal allergies     Family History  Problem Relation Age of Onset   Diabetes Mother    Liver disease Mother    Hypertension Mother     Past Surgical History:  Procedure Laterality Date   CESAREAN SECTION     SHOULDER ARTHROSCOPY WITH BICEPS TENDON REPAIR Left 08/29/2020   Procedure: LEFT SHOULDER ARTHROSCOPY WITH BICEPS TENODESIS;  Surgeon: Leandrew Koyanagi, MD;  Location: [facility];  Service: Orthopedics;  Laterality: Left;   TUBAL LIGATION     Social History   Occupational History   Not on file  Tobacco Use   Smoking status: Former   Smokeless tobacco: Never  Vaping Use   Vaping Use: Never used  Substance and Sexual Activity   Alcohol use: No   Drug use: No   Sexual activity: Yes    Birth control/protection: None

## 2023-01-21 ENCOUNTER — Telehealth: Payer: Self-pay | Admitting: Orthopaedic Surgery

## 2023-01-21 ENCOUNTER — Encounter: Payer: Self-pay | Admitting: Orthopaedic Surgery

## 2023-01-21 ENCOUNTER — Telehealth: Payer: Self-pay

## 2023-01-21 NOTE — Telephone Encounter (Signed)
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 She could not sleep on her side or her back, due to the aching, burning pain. Salonpas patch did not help. Sleepytime tea helped her get a little rest. She does have Methocarbamol, but it has not helped yet. She is asking for a work note for yesterday evening, and for a couple more days (3/12 - 3/14) while the medication starts to work and she is able to rest. Please call the patient at [phone] to advise.

## 2023-01-21 NOTE — Telephone Encounter (Signed)
Notified patient.

## 2023-01-21 NOTE — Telephone Encounter (Signed)
Ok until the end of the week

## 2023-01-21 NOTE — Telephone Encounter (Signed)
Pt called requesting a work note for out of work. Pt asking for her to be out of work for a couple more days. Pt states she is still in a lot of pain. Please fax letter to [facility]. to [phone]. Please call pt at [phone]. Pt states medication is not working.

## 2023-01-21 NOTE — Telephone Encounter (Signed)
Hey what is the Status of the work note that is supposed to be faxed over please advise Patient is very concerned she does not want to get in trouble with her job but she states she is in a lot of pain and does not thing she can come in I advised her she was okay to stay out until the end of the week per Venida Jarvis

## 2023-01-26 ENCOUNTER — Other Ambulatory Visit: Payer: Self-pay | Admitting: Nurse Practitioner

## 2023-02-05 ENCOUNTER — Encounter: Payer: Self-pay | Admitting: Orthopaedic Surgery

## 2023-02-05 MED ORDER — METHOCARBAMOL 750 MG PO TABS: 750.0000 mg | ORAL_TABLET | Freq: Two times a day (BID) | ORAL | 0 refills | Status: DC | PRN

## 2023-02-15 ENCOUNTER — Encounter: Payer: Self-pay | Admitting: Orthopaedic Surgery

## 2023-02-15 DIAGNOSIS — M545 Low back pain, unspecified: Secondary | ICD-10-CM | POA: Diagnosis not present

## 2023-02-15 DIAGNOSIS — M62830 Muscle spasm of back: Secondary | ICD-10-CM | POA: Diagnosis not present

## 2023-02-15 DIAGNOSIS — M5440 Lumbago with sciatica, unspecified side: Secondary | ICD-10-CM | POA: Diagnosis not present

## 2023-02-16 ENCOUNTER — Other Ambulatory Visit: Payer: Self-pay

## 2023-02-16 DIAGNOSIS — M545 Low back pain, unspecified: Secondary | ICD-10-CM

## 2023-02-16 MED ORDER — METHYLPREDNISOLONE 4 MG PO TBPK: ORAL_TABLET | ORAL | 0 refills | Status: DC

## 2023-02-16 NOTE — Telephone Encounter (Signed)
That's up to her if she needs to be out until then.

## 2023-02-16 NOTE — Telephone Encounter (Signed)
Please send in a prednisone dose pak and order MRI L spine if she's agreeable.  Thank you.

## 2023-02-17 NOTE — Telephone Encounter (Signed)
That's fine to take if she still has symptoms

## 2023-02-19 NOTE — Telephone Encounter (Signed)
No after MRI is fine

## 2023-02-19 NOTE — Telephone Encounter (Signed)
Any update on her MRI? 

## 2023-02-20 ENCOUNTER — Other Ambulatory Visit: Payer: Self-pay | Admitting: Podiatry

## 2023-02-20 ENCOUNTER — Other Ambulatory Visit: Payer: Self-pay | Admitting: Nurse Practitioner

## 2023-02-20 DIAGNOSIS — R7303 Prediabetes: Secondary | ICD-10-CM

## 2023-02-20 NOTE — Therapy (Unsigned)
OUTPATIENT PHYSICAL THERAPY THORACOLUMBAR EVALUATION   Patient Name: Deborah Jacobson MRN: [id number] DOB:[birth year], 42 y.o., female Today's Date: 02/23/2023  END OF SESSION:  PT End of Session - 02/23/23 0854     Visit Number 1    Number of Visits 16    Date for PT Re-Evaluation 04/20/23    Authorization Type [employer]    Authorization Time Period [employer] not filing    PT Start Time [phone]    PT Stop Time 0933    PT Time Calculation (min) 42 min    Activity Tolerance Patient tolerated treatment well    Behavior During Therapy WFL for tasks assessed/performed             Past Medical History:  Diagnosis Date   Acid reflux    Diabetes in pregnancy    Hypertension    Seasonal allergies    Past Surgical History:  Procedure Laterality Date   CESAREAN SECTION     SHOULDER ARTHROSCOPY WITH BICEPS TENDON REPAIR Left 08/29/2020   Procedure: LEFT SHOULDER ARTHROSCOPY WITH BICEPS TENODESIS;  Surgeon: Tarry Kos, MD;  Location: [facility];  Service: Orthopedics;  Laterality: Left;   TUBAL LIGATION     Patient Active Problem List   Diagnosis Date Noted   Essential hypertension 12/17/2022   Insomnia 12/17/2022   Subacromial bursitis of left shoulder joint 08/29/2020   Tendinopathy of left rotator cuff 08/29/2020   Morbid obesity with BMI of 45.0-49.9, adult 08/09/2020   Superior glenoid labrum lesion of left shoulder 07/10/2020   Health maintenance examination 03/23/2019   Prediabetes 12/07/2018   Strain of back Dec 07, 2018   Death of family member 12-07-2018   Back pain 07/31/2018   Other fatigue 05/31/2018   Chronic low back pain 11/30/2017   Shifting sleep-work schedule 06/15/2017   Paradoxical insomnia 06/15/2017   Sleep related headaches 06/15/2017   Super obese 06/15/2017   Mood complaints in sleep disorder 06/15/2017   Plantar fasciitis of left foot 03/01/2014   Porokeratosis 03/01/2014   Pain in lower limb 03/01/2014    PCP: Arnette Felts  FNP  REFERRING PROVIDER:    HPI patient is a pleasant 42 year old female who comes in today with neck and back pain.  Regards to her neck, she has had pain for the past week.  She denies any injury or change in activity.  The pain she has in the back of the neck which does radiate slightly to the left.  Pain is constant.  No weakness or paresthesias to either upper extremity.  She has increased pain with left-sided rotation of the cervical spine.  She has been taking NSAIDs without significant relief.  In regards to her back, she has pain to the low back.  Symptoms are worse in the cold.  She has been using a heating pad topical and oral NSAIDs without significant relief.  She denies any paresthesias or weakness to the lower leg.  No bowel or bladder chan  REFERRING DIAG:  M54.2 (ICD-10-CM) - Neck pain  M54.50 (ICD-10-CM) - Acute midline low back pain, unspecified whether sciatica present    Rationale for Evaluation and Treatment: Rehabilitation  THERAPY DIAG:  Abnormal posture  Radiculopathy, lumbar region  Cervicalgia  ONSET DATE: acute on chronic   SUBJECTIVE:  SUBJECTIVE STATEMENT: Patient referred for acute on chronic low back pain and neck pain.  She reports about a month ago she felt a severe increase in lower back pain on Rt side with new Rt LE pain. She has Lt sided back pain as well and last night it was in her LLE after sitting for a period of time.   Last Sunday she was bent over at work from pain, needed to have help from co workers, MRI ordered by Dr. Roda Shutters. At that time her LLE was about to give  out. 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  She has difficulty standing too long.  She has difficulty getting comfortable at night. The upper back then begins  to hurt.  She feels OK in her neck today .  The new medicine has improved her pain (prednisone).    PERTINENT HISTORY:  Shoulder surgery DDD neck and lumbar spine   PAIN:  Are you having pain? Yes: NPRS scale: 6/10 Pain location: bilateral lumbar to bilateral groin Pain description: achy, pounding  Aggravating factors: bednign, walking, standing and lat night sitting  Relieving factors: heating pad, meds, creams and muscle relaxer   PRECAUTIONS: None  WEIGHT BEARING RESTRICTIONS: No  FALLS:  Has patient fallen in last 6 months? Yes. Number of falls 1, walking down stairs, tripping  LIVING ENVIRONMENT: Lives with: lives with their family Lives in: House/apartment trailer  Stairs: Yes: External: 4 steps; on right going up Has following equipment at home: None  OCCUPATION: CNA full time at [employer]   PLOF: Independent, likes to go fishing, family time   PATIENT GOALS: I want to get better , do something to help myself   NEXT MD VISIT: Dr. Roda Shutters, ordered MRI   OBJECTIVE:   DIAGNOSTIC FINDINGS:  Moderate degenerative changes to the lower thoracic and upper lumbar spine   PATIENT SURVEYS:  FOTO emailed   SCREENING FOR RED FLAGS: Bowel or bladder incontinence: No Spinal tumors: No Cauda equina syndrome: No Compression fracture: No Abdominal aneurysm: No  COGNITION: Overall cognitive status: Within functional limits for tasks assessed     SENSATION: WFL  POSTURE: No Significant postural limitations, rounded shoulders, increased lumbar lordosis, anterior pelvic tilt, and genu recurvatum and genu valgus  PALPATION: Pain along Rt > Lt. Low lumbar paraspinals, Rt glute   LUMBAR ROM:   AROM Eval 02/23/23  Flexion Pain, limited to distal  Extension Limited 50%, pain  Right lateral flexion Pain on Rt   Left lateral flexion Pain on Rt   Right rotation Pain on Rt  Left rotation WFL    (Blank rows = not tested)   LOWER EXTREMITY ROM:    Passive  Right eval  Left eval  Hip flexion    Hip extension    Hip abduction    Hip adduction    Hip internal rotation Tight  Tight   Hip external rotation Tight Tight   Knee flexion    Knee extension    Ankle dorsiflexion    Ankle plantarflexion    Ankle inversion    Ankle eversion     (Blank rows = not tested)  LOWER EXTREMITY MMT:    MMT Right eval Left eval  Hip flexion 4- 4-  Hip extension    Hip abduction    Hip adduction    Hip internal rotation    Hip external rotation    Knee flexion 4 5  Knee extension 4+ 4+  Ankle dorsiflexion 4+ 5  Ankle plantarflexion    Ankle inversion    Ankle eversion     (Blank rows = not tested)  LUMBAR SPECIAL TESTS:  Straight leg raise test: Negative LE pain on Rt> Lt.   FUNCTIONAL TESTS:  NT on eval   GAIT: Distance walked: 150 Assistive device utilized: None Level of assistance: Complete Independence Comments: none noted on eval   TODAY'S TREATMENT:                                                                                                                              DATE: 02/23/23    PATIENT EDUCATION:  Education details: PT/POC, HEP, anatomy, effect of weight  Person educated: Patient Education method: Explanation, Demonstration, Verbal cues, and Handouts Education comprehension: verbalized understanding, returned demonstration, and needs further education  HOME EXERCISE PROGRAM: Access Code: [email or web address] URL: [email or web address] Date: 02/23/2023 Prepared by: Deborah Jacobson  Exercises - Supine Lower Trunk Rotation  - 1-2 x daily - 7 x weekly - 2 sets - 10 reps - 10 hold - Supine Posterior Pelvic Tilt  - 1-2 x daily - 7 x weekly - 2 sets - 10 reps - 10 hold - Standing Hip Flexor Stretch  - 1-2 x daily - 7 x weekly - 1 sets - 3 reps - 30 hold  ASSESSMENT:  CLINICAL IMPRESSION: Patient is a 42 y.o. female who was seen today for physical therapy evaluation and treatment for low back pain and neck pain. Focus on  lumbar as it has been much more symptomatic than the mid and upper back, neck.  MRI ordered.   OBJECTIVE IMPAIRMENTS: decreased activity tolerance, decreased knowledge of condition, decreased mobility, difficulty walking, decreased ROM, decreased strength, increased fascial restrictions, increased muscle spasms, impaired flexibility, improper body mechanics, postural dysfunction, obesity, and pain.   ACTIVITY LIMITATIONS: carrying, lifting, bending, sitting, standing, squatting, sleeping, stairs, locomotion level, and caring for others  PARTICIPATION LIMITATIONS: meal prep, cleaning, laundry, interpersonal relationship, shopping, community activity, occupation, and church  PERSONAL FACTORS: Profession, Time since onset of injury/illness/exacerbation, and 3+ comorbidities: obesity, chronic pain, previous surgery   are also affecting patient's functional outcome.   REHAB POTENTIAL: Good  CLINICAL DECISION MAKING: Evolving/moderate complexity  EVALUATION COMPLEXITY: Moderate   GOALS: Goals reviewed with patient? Yes  SHORT TERM GOALS: Target date: 03/23/2023    Pt will be I with HEP for neck and back flexibility, core strength Baseline: unknown  Goal status: INITIAL  2.  Pt will be able to stand for ADLs without increasing LE pain for short periods Baseline: painful, moderate  Goal status: INITIAL  3. Patient will complete FOTO and set goal for DC   Baseline: unable on eval   Goal status: INITIAL   3.  Pt will be able to transition on and off the mat, bed without increasing pain  Baseline: increased pain and effort  Goal status: INITIAL      LONG TERM GOALS:  Target date: 04/20/2023    Pt will be I with HEP for core, hips  Baseline:  Goal status: INITIAL  2.  Pt will be able to report no increased pain with patient care at work  Baseline:  Goal status: INITIAL  3.  Pt will be able to report sleep with 50% greater ease due to pain  Baseline:  Goal status:  INITIAL  4.  Pt will be able to don shoes and socks by crossing legs vs deep squat  Baseline:  Goal status: INITIAL  5. FOTO score TBA upon DC  Baseline: unknown   Goal status: INITIAL   PLAN:  PT FREQUENCY: 2x/week  PT DURATION: 8 weeks  PLANNED INTERVENTIONS: Therapeutic exercises, Therapeutic activity, Neuromuscular re-education, Balance training, Gait training, Patient/Family education, Self Care, Joint mobilization, Dry Needling, Electrical stimulation, Cryotherapy, Moist heat, Manual therapy, and Re-evaluation.  PLAN FOR NEXT SESSION: HEP, progress as able.  Extension if tolerated.    Deborah Jacobson, PT 02/23/2023, 10:42 AM   Deborah Jacobson, PT 02/23/23 2:35 PM Phone: [phone] Fax: [phone]

## 2023-02-20 NOTE — Telephone Encounter (Signed)
Please advise 

## 2023-02-23 ENCOUNTER — Encounter: Payer: Self-pay | Admitting: Physical Therapy

## 2023-02-23 ENCOUNTER — Ambulatory Visit: Payer: 59 | Attending: Physician Assistant | Admitting: Physical Therapy

## 2023-02-23 DIAGNOSIS — M545 Low back pain, unspecified: Secondary | ICD-10-CM | POA: Diagnosis not present

## 2023-02-23 DIAGNOSIS — M546 Pain in thoracic spine: Secondary | ICD-10-CM | POA: Insufficient documentation

## 2023-02-23 DIAGNOSIS — M6281 Muscle weakness (generalized): Secondary | ICD-10-CM | POA: Diagnosis not present

## 2023-02-23 DIAGNOSIS — G8929 Other chronic pain: Secondary | ICD-10-CM | POA: Diagnosis not present

## 2023-02-23 DIAGNOSIS — M5416 Radiculopathy, lumbar region: Secondary | ICD-10-CM | POA: Diagnosis not present

## 2023-02-23 DIAGNOSIS — M542 Cervicalgia: Secondary | ICD-10-CM | POA: Diagnosis not present

## 2023-02-23 DIAGNOSIS — R293 Abnormal posture: Secondary | ICD-10-CM | POA: Insufficient documentation

## 2023-02-26 ENCOUNTER — Ambulatory Visit
Admission: RE | Admit: 2023-02-26 | Discharge: 2023-02-26 | Disposition: A | Discharge status: Home / Self Care | Payer: 59 | Source: Ambulatory Visit | Attending: Orthopaedic Surgery | Admitting: Orthopaedic Surgery

## 2023-02-26 ENCOUNTER — Ambulatory Visit
Admission: RE | Admit: 2023-02-26 | Discharge: 2023-02-26 | Disposition: A | Discharge status: Home / Self Care | Payer: 59 | Source: Ambulatory Visit | Attending: Obstetrics and Gynecology | Admitting: Obstetrics and Gynecology

## 2023-02-26 DIAGNOSIS — R202 Paresthesia of skin: Secondary | ICD-10-CM | POA: Diagnosis not present

## 2023-02-26 DIAGNOSIS — M545 Low back pain, unspecified: Secondary | ICD-10-CM

## 2023-02-26 DIAGNOSIS — Z1231 Encounter for screening mammogram for malignant neoplasm of breast: Secondary | ICD-10-CM

## 2023-02-26 DIAGNOSIS — R200 Anesthesia of skin: Secondary | ICD-10-CM | POA: Diagnosis not present

## 2023-02-28 ENCOUNTER — Other Ambulatory Visit: Payer: Self-pay | Admitting: Nurse Practitioner

## 2023-02-28 DIAGNOSIS — R112 Nausea with vomiting, unspecified: Secondary | ICD-10-CM

## 2023-02-28 DIAGNOSIS — G4700 Insomnia, unspecified: Secondary | ICD-10-CM

## 2023-03-03 NOTE — Therapy (Unsigned)
OUTPATIENT PHYSICAL THERAPY TREATMENT NOTE   Patient Name: Deborah Jacobson MRN: [id number] DOB:1981/07/19, 42 y.o., female Today's Date: 03/04/2023  PCP: Arnette Felts FNP  REFERRING PROVIDER: Cristie Hem, PA-C  END OF SESSION:   PT End of Session - 03/04/23 1328     Visit Number 2    Number of Visits 16    Date for PT Re-Evaluation 04/20/23    Authorization Type Aetna    Authorization Time Period MCD [employer] not filing    PT Start Time 1330    PT Stop Time 1415    PT Time Calculation (min) 45 min             Past Medical History:  Diagnosis Date   Acid reflux    Diabetes in pregnancy    Hypertension    Seasonal allergies    Past Surgical History:  Procedure Laterality Date   CESAREAN SECTION     SHOULDER ARTHROSCOPY WITH BICEPS TENDON REPAIR Left 08/29/2020   Procedure: LEFT SHOULDER ARTHROSCOPY WITH BICEPS TENODESIS;  Surgeon: Tarry Kos, MD;  Location: [facility];  Service: Orthopedics;  Laterality: Left;   TUBAL LIGATION     Patient Active Problem List   Diagnosis Date Noted   Essential hypertension 12/17/2022   Insomnia 12/17/2022   Subacromial bursitis of left shoulder joint 08/29/2020   Tendinopathy of left rotator cuff 08/29/2020   Morbid obesity with BMI of 45.0-49.9, adult 08/09/2020   Superior glenoid labrum lesion of left shoulder 07/10/2020   Health maintenance examination 03/23/2019   Prediabetes 22-Dec-2018   Strain of back 12-22-18   Death of family member 2018-12-22   Back pain 07/31/2018   Other fatigue 05/31/2018   Chronic low back pain 11/30/2017   Shifting sleep-work schedule 06/15/2017   Paradoxical insomnia 06/15/2017   Sleep related headaches 06/15/2017   Super obese 06/15/2017   Mood complaints in sleep disorder 06/15/2017   Plantar fasciitis of left foot 03/01/2014   Porokeratosis 03/01/2014   Pain in lower limb 03/01/2014    REFERRING DIAG:  M54.2 (ICD-10-CM) - Neck pain  M54.50 (ICD-10-CM) - Acute  midline low back pain, unspecified whether sciatica present    THERAPY DIAG:  Radiculopathy, lumbar region  Abnormal posture  Cervicalgia  Pain in thoracic spine  Chronic bilateral low back pain without sciatica  Muscle weakness (generalized)  Rationale for Evaluation and Treatment Rehabilitation  PERTINENT HISTORY: see above  PRECAUTIONS: none   SUBJECTIVE:  SUBJECTIVE STATEMENT:  I am hurting all over.  Arms, legs, back and Rt hip. I sorted through clothes and things the other day.  Was not heavy lifting, I was off that day.    PAIN:  Are you having pain? Yes: NPRS scale: 9/10 Pain location: all over but back and Rt LE  Pain description: stiff, sore  Aggravating factors: sitting, constant  Relieving factors: not sure, took meds, Epsom salt bath    OBJECTIVE: (objective measures completed at initial evaluation unless otherwise dated)  DIAGNOSTIC FINDINGS:    MRI: 03/02/23 Impression:  No osseous abnormality. Only subtle lumbar disc degeneration. Intermittent Lumbar facet hypertrophy which is mild, except moderate on the left at L5-S1. No lumbar spinal stenosis or convincing neural impingement.      Moderate degenerative changes to the lower thoracic and upper lumbar spine    PATIENT SURVEYS:  FOTO emailed    SCREENING FOR RED FLAGS: Bowel or bladder incontinence: No Spinal tumors: No Cauda equina syndrome: No Compression fracture: No Abdominal aneurysm: No   COGNITION: Overall cognitive status: Within functional limits for tasks assessed                          SENSATION: WFL   POSTURE: No Significant postural limitations, rounded shoulders, increased lumbar lordosis, anterior pelvic tilt, and genu recurvatum and genu valgus   PALPATION: Pain along Rt > Lt. Low lumbar  paraspinals, Rt glute    LUMBAR ROM:    AROM Eval 02/23/23  Flexion Pain, limited to distal  Extension Limited 50%, pain  Right lateral flexion Pain on Rt   Left lateral flexion Pain on Rt   Right rotation Pain on Rt  Left rotation WFL    (Blank rows = not tested)    LOWER EXTREMITY ROM:    Passive  Right eval Left eval  Hip flexion      Hip extension      Hip abduction      Hip adduction      Hip internal rotation Tight  Tight   Hip external rotation Tight Tight   Knee flexion      Knee extension      Ankle dorsiflexion      Ankle plantarflexion      Ankle inversion      Ankle eversion       (Blank rows = not tested)   LOWER EXTREMITY MMT:     MMT Right eval Left eval  Hip flexion 4- 4-  Hip extension      Hip abduction      Hip adduction      Hip internal rotation      Hip external rotation      Knee flexion 4 5  Knee extension 4+ 4+  Ankle dorsiflexion 4+ 5  Ankle plantarflexion      Ankle inversion      Ankle eversion       (Blank rows = not tested)   LUMBAR SPECIAL TESTS:  Straight leg raise test: Negative LE pain on Rt> Lt.    FUNCTIONAL TESTS:  NT on eval    GAIT: Distance walked: 150 Assistive device utilized: None Level of assistance: Complete Independence Comments: none noted on eval    TODAY'S TREATMENT:       [facility] Adult PT Treatment:  DATE: 03/04/23 Therapeutic Exercise: Small ROM LTR x 10  Posterior pelvic tilt x 10 with breathing  Knee to chest 3 x 30 sec used sheet for assist  Supported knee extension used sheet to hold thigh x 10 , nerve gliding  Upper trunk rotation x  5  Clam x 15  Hip abduction x 10  Hamstring stretch 30 sec x 4  Calf stretch 30 sec x 3 on slantboard  Supported deep squat  x 10  Squat with light UE support  x 10  Hip abduction x 15  Hip flexor stretch x 30 sec x 3                                                                                                                    DATE: 02/23/23      PATIENT EDUCATION:  Education details: PT/POC, HEP, anatomy, effect of weight  Person educated: Patient Education method: Explanation, Demonstration, Verbal cues, and Handouts Education comprehension: verbalized understanding, returned demonstration, and needs further education   HOME EXERCISE PROGRAM: Access Code: [email or web address] URL: [email or web address] Date: 02/23/2023 Prepared by: Karie Mainland   Exercises - Supine Lower Trunk Rotation  - 1-2 x daily - 7 x weekly - 2 sets - 10 reps - 10 hold - Supine Posterior Pelvic Tilt  - 1-2 x daily - 7 x weekly - 2 sets - 10 reps - 10 hold - Standing Hip Flexor Stretch  - 1-2 x daily - 7 x weekly - 1 sets - 3 reps - 30 hold   ASSESSMENT:   CLINICAL IMPRESSION: Patient with global joint pain when she arrived to session today.  Able to go thorough a progression of hip and trunk mobility and strength.  She does show fatigue and decreased muscle endurance with standing exercises.  She reports decreased pain in groin since eval. She has not heard back from Dr. Roda Shutters regarding her MRI.  Cont POC.    OBJECTIVE IMPAIRMENTS: decreased activity tolerance, decreased knowledge of condition, decreased mobility, difficulty walking, decreased ROM, decreased strength, increased fascial restrictions, increased muscle spasms, impaired flexibility, improper body mechanics, postural dysfunction, obesity, and pain.    ACTIVITY LIMITATIONS: carrying, lifting, bending, sitting, standing, squatting, sleeping, stairs, locomotion level, and caring for others   PARTICIPATION LIMITATIONS: meal prep, cleaning, laundry, interpersonal relationship, shopping, community activity, occupation, and church   PERSONAL FACTORS: Profession, Time since onset of injury/illness/exacerbation, and 3+ comorbidities: obesity, chronic pain, previous surgery   are also affecting patient's functional outcome.    REHAB POTENTIAL: Good   CLINICAL  DECISION MAKING: Evolving/moderate complexity   EVALUATION COMPLEXITY: Moderate     GOALS: Goals reviewed with patient? Yes   SHORT TERM GOALS: Target date: 03/23/2023     Pt will be I with HEP for neck and back flexibility, core strength Baseline: unknown  Goal status: INITIAL   2.  Pt will be able to stand for ADLs without increasing LE pain for short periods Baseline: painful, moderate  Goal status: INITIAL  3. Patient will complete FOTO and set goal for DC             Baseline: unable on eval             Goal status: INITIAL    3.  Pt will be able to transition on and off the mat, bed without increasing pain  Baseline: increased pain and effort  Goal status: INITIAL           LONG TERM GOALS: Target date: 04/20/2023     Pt will be I with HEP for core, hips  Baseline:  Goal status: INITIAL   2.  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  Pt will be able to report sleep with 50% greater ease due to pain  Baseline:  Goal status: INITIAL   4.  Pt will be able to don shoes and socks by crossing legs vs deep squat  Baseline:  Goal status: INITIAL   5. FOTO score TBA upon DC            Baseline: unknown             Goal status: INITIAL    PLAN:   PT FREQUENCY: 2x/week   PT DURATION: 8 weeks   PLANNED INTERVENTIONS: Therapeutic exercises, Therapeutic activity, Neuromuscular re-education, Balance training, Gait training, Patient/Family education, Self Care, Joint mobilization, Dry Needling, Electrical stimulation, Cryotherapy, Moist heat, Manual therapy, and Re-evaluation.   PLAN FOR NEXT SESSION: HEP, progress as able.  Extension if tolerated.      Hideko Esselman, PT 02/23/2023, 10:42 AM    Karie Mainland, PT 02/23/23 2:35 PM Phone: [phone] Fax: [phone]   Elayne Gruver, PT 03/04/2023, 1:29 PM

## 2023-03-04 ENCOUNTER — Encounter: Payer: Self-pay | Admitting: Physical Therapy

## 2023-03-04 ENCOUNTER — Ambulatory Visit: Payer: 59 | Admitting: Physical Therapy

## 2023-03-04 DIAGNOSIS — M5416 Radiculopathy, lumbar region: Secondary | ICD-10-CM | POA: Diagnosis not present

## 2023-03-04 DIAGNOSIS — R293 Abnormal posture: Secondary | ICD-10-CM

## 2023-03-04 DIAGNOSIS — M546 Pain in thoracic spine: Secondary | ICD-10-CM

## 2023-03-04 DIAGNOSIS — M542 Cervicalgia: Secondary | ICD-10-CM

## 2023-03-04 DIAGNOSIS — G8929 Other chronic pain: Secondary | ICD-10-CM

## 2023-03-04 DIAGNOSIS — M6281 Muscle weakness (generalized): Secondary | ICD-10-CM | POA: Diagnosis not present

## 2023-03-04 DIAGNOSIS — M545 Low back pain, unspecified: Secondary | ICD-10-CM | POA: Diagnosis not present

## 2023-03-09 ENCOUNTER — Ambulatory Visit: Payer: 59 | Admitting: Physical Therapy

## 2023-03-09 ENCOUNTER — Encounter: Payer: Self-pay | Admitting: Physical Therapy

## 2023-03-09 DIAGNOSIS — M5416 Radiculopathy, lumbar region: Secondary | ICD-10-CM | POA: Diagnosis not present

## 2023-03-09 DIAGNOSIS — M545 Low back pain, unspecified: Secondary | ICD-10-CM | POA: Diagnosis not present

## 2023-03-09 DIAGNOSIS — M542 Cervicalgia: Secondary | ICD-10-CM | POA: Diagnosis not present

## 2023-03-09 DIAGNOSIS — M546 Pain in thoracic spine: Secondary | ICD-10-CM | POA: Diagnosis not present

## 2023-03-09 DIAGNOSIS — R293 Abnormal posture: Secondary | ICD-10-CM

## 2023-03-09 DIAGNOSIS — G8929 Other chronic pain: Secondary | ICD-10-CM | POA: Diagnosis not present

## 2023-03-09 DIAGNOSIS — M6281 Muscle weakness (generalized): Secondary | ICD-10-CM | POA: Diagnosis not present

## 2023-03-09 NOTE — Therapy (Signed)
OUTPATIENT PHYSICAL THERAPY TREATMENT NOTE   Patient Name: Deborah Jacobson MRN: [id number] DOB:May 01, 1981, 42 y.o., female Today's Date: 03/09/2023  PCP: Arnette Felts FNP  REFERRING PROVIDER: Cristie Hem, PA-C  END OF SESSION:   PT End of Session - 03/09/23 1019     Visit Number 3    Number of Visits 16    Date for PT Re-Evaluation 04/20/23    Authorization Type Aetna    Authorization Time Period MCD [employer] not filing    PT Start Time 1018    PT Stop Time 1100    PT Time Calculation (min) 42 min             Past Medical History:  Diagnosis Date   Acid reflux    Diabetes in pregnancy    Hypertension    Seasonal allergies    Past Surgical History:  Procedure Laterality Date   CESAREAN SECTION     SHOULDER ARTHROSCOPY WITH BICEPS TENDON REPAIR Left 08/29/2020   Procedure: LEFT SHOULDER ARTHROSCOPY WITH BICEPS TENODESIS;  Surgeon: Tarry Kos, MD;  Location: [facility];  Service: Orthopedics;  Laterality: Left;   TUBAL LIGATION     Patient Active Problem List   Diagnosis Date Noted   Essential hypertension 12/17/2022   Insomnia 12/17/2022   Subacromial bursitis of left shoulder joint 08/29/2020   Tendinopathy of left rotator cuff 08/29/2020   Morbid obesity with BMI of 45.0-49.9, adult (HCC) 08/09/2020   Superior glenoid labrum lesion of left shoulder 07/10/2020   Health maintenance examination 03/23/2019   Prediabetes December 13, 2018   Strain of back 13-Dec-2018   Death of family member 2018-12-13   Back pain 07/31/2018   Other fatigue 05/31/2018   Chronic low back pain 11/30/2017   Shifting sleep-work schedule 06/15/2017   Paradoxical insomnia 06/15/2017   Sleep related headaches 06/15/2017   Super obese 06/15/2017   Mood complaints in sleep disorder 06/15/2017   Plantar fasciitis of left foot 03/01/2014   Porokeratosis 03/01/2014   Pain in lower limb 03/01/2014    REFERRING DIAG:  M54.2 (ICD-10-CM) - Neck pain  M54.50 (ICD-10-CM) -  Acute midline low back pain, unspecified whether sciatica present    THERAPY DIAG:  Radiculopathy, lumbar region  Abnormal posture  Rationale for Evaluation and Treatment Rehabilitation  PERTINENT HISTORY: see above  PRECAUTIONS: none   SUBJECTIVE:                                                                                                                                                                                      SUBJECTIVE STATEMENT: I still have pain with sitting or standing too long. I felt  great after last session. It helped my a lot. Pain level 2/10 in back and hips. 1/10 neck pain. I did some extra exercises that I remembered from last time.      PAIN:  Are you having pain? Yes: NPRS scale: 2/10 Pain location: all over but back and Rt LE  Pain description: stiff, sore  Aggravating factors: sitting, constant  Relieving factors: not sure, took meds, Epsom salt bath    OBJECTIVE: (objective measures completed at initial evaluation unless otherwise dated)  DIAGNOSTIC FINDINGS:    MRI: 03/02/23 Impression:  No osseous abnormality. Only subtle lumbar disc degeneration. Intermittent Lumbar facet hypertrophy which is mild, except moderate on the left at L5-S1. No lumbar spinal stenosis or convincing neural impingement.      Moderate degenerative changes to the lower thoracic and upper lumbar spine    PATIENT SURVEYS:  FOTO emailed  62% Neck-taken visit 3 59% lumbar- taken visit 3   SCREENING FOR RED FLAGS: Bowel or bladder incontinence: No Spinal tumors: No Cauda equina syndrome: No Compression fracture: No Abdominal aneurysm: No   COGNITION: Overall cognitive status: Within functional limits for tasks assessed                          SENSATION: WFL   POSTURE: No Significant postural limitations, rounded shoulders, increased lumbar lordosis, anterior pelvic tilt, and genu recurvatum and genu valgus   PALPATION: Pain along Rt > Lt. Low  lumbar paraspinals, Rt glute    LUMBAR ROM:    AROM Eval 02/23/23  Flexion Pain, limited to distal  Extension Limited 50%, pain  Right lateral flexion Pain on Rt   Left lateral flexion Pain on Rt   Right rotation Pain on Rt  Left rotation WFL    (Blank rows = not tested)    LOWER EXTREMITY ROM:    Passive  Right eval Left eval  Hip flexion      Hip extension      Hip abduction      Hip adduction      Hip internal rotation Tight  Tight   Hip external rotation Tight Tight   Knee flexion      Knee extension      Ankle dorsiflexion      Ankle plantarflexion      Ankle inversion      Ankle eversion       (Blank rows = not tested)   LOWER EXTREMITY MMT:     MMT Right eval Left eval  Hip flexion 4- 4-  Hip extension      Hip abduction      Hip adduction      Hip internal rotation      Hip external rotation      Knee flexion 4 5  Knee extension 4+ 4+  Ankle dorsiflexion 4+ 5  Ankle plantarflexion      Ankle inversion      Ankle eversion       (Blank rows = not tested)   LUMBAR SPECIAL TESTS:  Straight leg raise test: Negative LE pain on Rt> Lt.    FUNCTIONAL TESTS:  NT on eval    GAIT: Distance walked: 150 Assistive device utilized: None Level of assistance: Complete Independence Comments: none noted on eval    TODAY'S TREATMENT:     [facility] Adult PT Treatment:  DATE: 03/09/23 Therapeutic Exercise: Hip abduction  Hip flexion Squat at counter Heel raises  Hip flexor stretch standing Seated lumbar flexion stretch with ball  Supine hamstring stretch +ITB stretch with strap  Open books , top leg dropped over bottom- modified arm to chest for overpressure  Side clam x 15 PPT x 15  PPT to bridge    [facility] Adult PT Treatment:                                                DATE: 03/04/23 Therapeutic Exercise: Small ROM LTR x 10  Posterior pelvic tilt x 10 with breathing  Knee to chest 3 x 30 sec used sheet for  assist  Supported knee extension used sheet to hold thigh x 10 , nerve gliding  Upper trunk rotation x  5  Clam x 15  Hip abduction x 10  Hamstring stretch 30 sec x 4  Calf stretch 30 sec x 3 on slantboard  Supported deep squat  x 10  Squat with light UE support  x 10  Hip abduction x 15  Hip flexor stretch x 30 sec x 3                                                                                                                   DATE: 02/23/23      PATIENT EDUCATION:  Education details: PT/POC, HEP, anatomy, effect of weight  Person educated: Patient Education method: Explanation, Demonstration, Verbal cues, and Handouts Education comprehension: verbalized understanding, returned demonstration, and needs further education   HOME EXERCISE PROGRAM: Access Code: [email or web address] URL: [email or web address] Date: 02/23/2023 Prepared by: Karie Mainland   Exercises - Supine Lower Trunk Rotation  - 1-2 x daily - 7 x weekly - 2 sets - 10 reps - 10 hold - Supine Posterior Pelvic Tilt  - 1-2 x daily - 7 x weekly - 2 sets - 10 reps - 10 hold - Standing Hip Flexor Stretch  - 1-2 x daily - 7 x weekly - 1 sets - 3 reps - 30 hold   ASSESSMENT:   CLINICAL IMPRESSION: Patient reports improvement after last visit. She has been compliant with HEP. Needs cues for posterior pelvic tilt. Continued with open and closed chain LE and core strength. Captured FOTO intake scores. Can set targets. Updated HEP with counter squats and side clams. She reported feeling good at end of session.     OBJECTIVE IMPAIRMENTS: decreased activity tolerance, decreased knowledge of condition, decreased mobility, difficulty walking, decreased ROM, decreased strength, increased fascial restrictions, increased muscle spasms, impaired flexibility, improper body mechanics, postural dysfunction, obesity, and pain.    ACTIVITY LIMITATIONS: carrying, lifting, bending, sitting, standing, squatting, sleeping, stairs,  locomotion level, and caring for others   PARTICIPATION LIMITATIONS: meal prep, cleaning, laundry, interpersonal relationship, shopping, community activity, occupation, and church   PERSONAL FACTORS: Profession, Time since  onset of injury/illness/exacerbation, and 3+ comorbidities: obesity, chronic pain, previous surgery   are also affecting patient's functional outcome.    REHAB POTENTIAL: Good   CLINICAL DECISION MAKING: Evolving/moderate complexity   EVALUATION COMPLEXITY: Moderate     GOALS: Goals reviewed with patient? Yes   SHORT TERM GOALS: Target date: 03/23/2023     Pt will be I with HEP for neck and back flexibility, core strength Baseline: unknown  Goal status: INITIAL   2.  Pt will be able to stand for ADLs without increasing LE pain for short periods Baseline: painful, moderate  Goal status: INITIAL   3. Patient will complete FOTO and set goal for DC             Baseline: unable on eval             Goal status: INITIAL    3.  Pt will be able to transition on and off the mat, bed without increasing pain  Baseline: increased pain and effort  Goal status: INITIAL           LONG TERM GOALS: Target date: 04/20/2023     Pt will be I with HEP for core, hips  Baseline:  Goal status: INITIAL   2.  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  Pt will be able to report sleep with 50% greater ease due to pain  Baseline:  Goal status: INITIAL   4.  Pt will be able to don shoes and socks by crossing legs vs deep squat  Baseline:  Goal status: INITIAL   5. FOTO score TBA upon DC            Baseline: unknown             Goal status: INITIAL    PLAN:   PT FREQUENCY: 2x/week   PT DURATION: 8 weeks   PLANNED INTERVENTIONS: Therapeutic exercises, Therapeutic activity, Neuromuscular re-education, Balance training, Gait training, Patient/Family education, Self Care, Joint mobilization, Dry  Needling, Electrical stimulation, Cryotherapy, Moist heat, Manual therapy, and Re-evaluation.   PLAN FOR NEXT SESSION: HEP, progress as able.  Extension if tolerated.      Jannette Spanner, PTA 03/09/23 11:31 AM Phone: [phone] Fax: [phone]

## 2023-03-11 ENCOUNTER — Other Ambulatory Visit: Payer: 59

## 2023-03-11 ENCOUNTER — Encounter: Payer: Self-pay | Admitting: Orthopaedic Surgery

## 2023-03-11 NOTE — Therapy (Unsigned)
OUTPATIENT PHYSICAL THERAPY TREATMENT NOTE   Patient Name: Deborah Jacobson MRN: [id number] DOB:24-Jan-1981, 42 y.o., female Today's Date: 03/12/2023  PCP: Arnette Felts FNP  REFERRING PROVIDER: Cristie Hem, PA-C  END OF SESSION:   PT End of Session - 03/12/23 0933     Visit Number 4    Number of Visits 16    Date for PT Re-Evaluation 04/20/23    Authorization Type [employer]    Authorization Time Period MCD [employer] not filing    PT Start Time 0932    PT Stop Time 1015    PT Time Calculation (min) 43 min    Activity Tolerance Patient tolerated treatment well    Behavior During Therapy WFL for tasks assessed/performed              Past Medical History:  Diagnosis Date   Acid reflux    Diabetes in pregnancy    Hypertension    Seasonal allergies    Past Surgical History:  Procedure Laterality Date   CESAREAN SECTION     SHOULDER ARTHROSCOPY WITH BICEPS TENDON REPAIR Left 08/29/2020   Procedure: LEFT SHOULDER ARTHROSCOPY WITH BICEPS TENODESIS;  Surgeon: Tarry Kos, MD;  Location: [facility];  Service: Orthopedics;  Laterality: Left;   TUBAL LIGATION     Patient Active Problem List   Diagnosis Date Noted   Essential hypertension 12/17/2022   Insomnia 12/17/2022   Subacromial bursitis of left shoulder joint 08/29/2020   Tendinopathy of left rotator cuff 08/29/2020   Morbid obesity with BMI of 45.0-49.9, adult (HCC) 08/09/2020   Superior glenoid labrum lesion of left shoulder 07/10/2020   Health maintenance examination 03/23/2019   Prediabetes 12-11-18   Strain of back 2018/12/11   Death of family member 11-Dec-2018   Back pain 07/31/2018   Other fatigue 05/31/2018   Chronic low back pain 11/30/2017   Shifting sleep-work schedule 06/15/2017   Paradoxical insomnia 06/15/2017   Sleep related headaches 06/15/2017   Super obese 06/15/2017   Mood complaints in sleep disorder 06/15/2017   Plantar fasciitis of left foot 03/01/2014   Porokeratosis  03/01/2014   Pain in lower limb 03/01/2014    REFERRING DIAG:  M54.2 (ICD-10-CM) - Neck pain  M54.50 (ICD-10-CM) - Acute midline low back pain, unspecified whether sciatica present    THERAPY DIAG:  Radiculopathy, lumbar region  Abnormal posture  Cervicalgia  Rationale for Evaluation and Treatment Rehabilitation  PERTINENT HISTORY: see above  PRECAUTIONS: none   SUBJECTIVE:  SUBJECTIVE STATEMENT: Saw Dr. Roda Shutters this AM  I think I overdid it with the exercises.  Pain is more today.  Did not rate.      PAIN:  Are you having pain? Yes: NPRS scale: 2/10 Pain location: all over but back and Rt LE  Pain description: stiff, sore  Aggravating factors: sitting, constant  Relieving factors: not sure, took meds, Epsom salt bath    OBJECTIVE: (objective measures completed at initial evaluation unless otherwise dated)  DIAGNOSTIC FINDINGS:    MRI: 03/02/23 Impression:  No osseous abnormality. Only subtle lumbar disc degeneration. Intermittent Lumbar facet hypertrophy which is mild, except moderate on the left at L5-S1. No lumbar spinal stenosis or convincing neural impingement.      Moderate degenerative changes to the lower thoracic and upper lumbar spine    PATIENT SURVEYS:  FOTO emailed  62% Neck-taken visit 3 59% lumbar- taken visit 3   SCREENING FOR RED FLAGS: Bowel or bladder incontinence: No Spinal tumors: No Cauda equina syndrome: No Compression fracture: No Abdominal aneurysm: No   COGNITION: Overall cognitive status: Within functional limits for tasks assessed                          SENSATION: WFL   POSTURE: No Significant postural limitations, rounded shoulders, increased lumbar lordosis, anterior pelvic tilt, and genu recurvatum and genu valgus   PALPATION: Pain along  Rt > Lt. Low lumbar paraspinals, Rt glute    LUMBAR ROM:    AROM Eval 02/23/23  Flexion Pain, limited to distal  Extension Limited 50%, pain  Right lateral flexion Pain on Rt   Left lateral flexion Pain on Rt   Right rotation Pain on Rt  Left rotation WFL    (Blank rows = not tested)    LOWER EXTREMITY ROM:    Passive  Right eval Left eval  Hip flexion      Hip extension      Hip abduction      Hip adduction      Hip internal rotation Tight  Tight   Hip external rotation Tight Tight   Knee flexion      Knee extension      Ankle dorsiflexion      Ankle plantarflexion      Ankle inversion      Ankle eversion       (Blank rows = not tested)   LOWER EXTREMITY MMT:     MMT Right eval Left eval  Hip flexion 4- 4-  Hip extension      Hip abduction      Hip adduction      Hip internal rotation      Hip external rotation      Knee flexion 4 5  Knee extension 4+ 4+  Ankle dorsiflexion 4+ 5  Ankle plantarflexion      Ankle inversion      Ankle eversion       (Blank rows = not tested)   LUMBAR SPECIAL TESTS:  Straight leg raise test: Negative LE pain on Rt> Lt.    FUNCTIONAL TESTS:  NT on eval    GAIT: Distance walked: 150 Assistive device utilized: None Level of assistance: Complete Independence Comments: none noted on eval    TODAY'S TREATMENT:      [facility] Adult PT Treatment:  DATE: 03/12/23 Therapeutic Exercise: Cat and camel to child's pose  Supine LTR x 10  Rt ant groin stretch off edge of table added L knee to chest with towel  Posterior pelvic tilt x 10  Tilt to bridge x 10 Standing wall mini squat x 30 sec Iso mini squat with dumbbell press  6 lbs x 10  Standing core on Airex dumbbell pass lateral, figure 8 and rowing NuStep L5 UE and LE for 8 min  Manual Therapy: LAD Rt LE  Self Care: Discussed need to do gentle stretching regardless of how her back and leg feel Body mechanics at work are more likely  the culprit of her increased back pain     [facility] Adult PT Treatment:                                                DATE: 03/09/23 Therapeutic Exercise: Hip abduction  Hip flexion Squat at counter Heel raises  Hip flexor stretch standing Seated lumbar flexion stretch with ball  Supine hamstring stretch +ITB stretch with strap  Open books , top leg dropped over bottom- modified arm to chest for overpressure  Side clam x 15 PPT x 15  PPT to bridge    [facility] Adult PT Treatment:                                                DATE: 03/04/23 Therapeutic Exercise: Small ROM LTR x 10  Posterior pelvic tilt x 10 with breathing  Knee to chest 3 x 30 sec used sheet for assist  Supported knee extension used sheet to hold thigh x 10 , nerve gliding  Upper trunk rotation x  5  Clam x 15  Hip abduction x 10  Hamstring stretch 30 sec x 4  Calf stretch 30 sec x 3 on slantboard  Supported deep squat  x 10  Squat with light UE support  x 10  Hip abduction x 15  Hip flexor stretch x 30 sec x 3                                                                                                                   DATE: 02/23/23      PATIENT EDUCATION:  Education details: PT/POC, HEP, anatomy, effect of weight  Person educated: Patient Education method: Explanation, Demonstration, Verbal cues, and Handouts Education comprehension: verbalized understanding, returned demonstration, and needs further education   HOME EXERCISE PROGRAM: Access Code: [email or web address] URL: [email or web address] Date: 02/23/2023 Prepared by: Karie Mainland   Exercises - Supine Lower Trunk Rotation  - 1-2 x daily - 7 x weekly - 2 sets - 10 reps - 10 hold - Supine Posterior Pelvic Tilt  - 1-2 x daily -  7 x weekly - 2 sets - 10 reps - 10 hold - Standing Hip Flexor Stretch  - 1-2 x daily - 7 x weekly - 1 sets - 3 reps - 30 hold -clam  -Squat   Cat and camel to child's pose   ASSESSMENT:   CLINICAL  IMPRESSION: Patient was able to work on HEP and understand posture and body mechanics as it relates to her back and patient care at work.  She does not have dependent patients at work but she does have to lean forward, help them dress and bathe.  She needed some cues for her HEP and breathing, technique.  Dr. Roda Shutters recommended she consider injections for her back and she is agreeable to it.  Cont POC.     OBJECTIVE IMPAIRMENTS: decreased activity tolerance, decreased knowledge of condition, decreased mobility, difficulty walking, decreased ROM, decreased strength, increased fascial restrictions, increased muscle spasms, impaired flexibility, improper body mechanics, postural dysfunction, obesity, and pain.    ACTIVITY LIMITATIONS: carrying, lifting, bending, sitting, standing, squatting, sleeping, stairs, locomotion level, and caring for others   PARTICIPATION LIMITATIONS: meal prep, cleaning, laundry, interpersonal relationship, shopping, community activity, occupation, and church   PERSONAL FACTORS: Profession, Time since onset of injury/illness/exacerbation, and 3+ comorbidities: obesity, chronic pain, previous surgery   are also affecting patient's functional outcome.    REHAB POTENTIAL: Good   CLINICAL DECISION MAKING: Evolving/moderate complexity   EVALUATION COMPLEXITY: Moderate     GOALS: Goals reviewed with patient? Yes   SHORT TERM GOALS: Target date: 03/23/2023     Pt will be I with HEP for neck and back flexibility, core strength Baseline: unknown  Goal status: ongoing    2.  Pt will be able to stand for ADLs without increasing LE pain for short periods Baseline: painful, moderate  Goal status: ongoing    3. Patient will complete FOTO and set goal for DC             Baseline: 46% eval            Goal status: MET   3.  Pt will be able to transition on and off the mat, bed without increasing pain  Baseline:cues needed  Goal status: ongoing            LONG TERM  GOALS: Target date: 04/20/2023     Pt will be I with HEP for core, hips  Baseline:  Goal status: INITIAL   2.  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  Pt will be able to report sleep with 50% greater ease due to pain  Baseline:  Goal status: INITIAL   4.  Pt will be able to don shoes and socks by crossing legs vs deep squat  Baseline:  Goal status: INITIAL   5. FOTO score will improve to 65% (lumbar) to demo improved functional mobility             Baseline: unknown             Goal status: INITIAL    PLAN:   PT FREQUENCY: 2x/week   PT DURATION: 8 weeks   PLANNED INTERVENTIONS: Therapeutic exercises, Therapeutic activity, Neuromuscular re-education, Balance training, Gait training, Patient/Family education, Self Care, Joint mobilization, Dry Needling, Electrical stimulation, Cryotherapy, Moist heat, Manual therapy, and Re-evaluation.   PLAN FOR NEXT SESSION: HEP, progress as able. Body mechanics, hinge, lift  . Wall for core  and LE    Karie Mainland, PT 03/12/23 11:53 AM Phone: [phone] Fax: [phone] Fax: [phone]

## 2023-03-12 ENCOUNTER — Ambulatory Visit (INDEPENDENT_AMBULATORY_CARE_PROVIDER_SITE_OTHER): Payer: 59 | Admitting: Orthopaedic Surgery

## 2023-03-12 ENCOUNTER — Other Ambulatory Visit: Payer: Self-pay

## 2023-03-12 ENCOUNTER — Ambulatory Visit: Payer: 59 | Attending: Physician Assistant | Admitting: Physical Therapy

## 2023-03-12 ENCOUNTER — Encounter: Payer: Self-pay | Admitting: Physical Therapy

## 2023-03-12 ENCOUNTER — Encounter: Payer: Self-pay | Admitting: Orthopaedic Surgery

## 2023-03-12 DIAGNOSIS — M546 Pain in thoracic spine: Secondary | ICD-10-CM | POA: Insufficient documentation

## 2023-03-12 DIAGNOSIS — G8929 Other chronic pain: Secondary | ICD-10-CM | POA: Diagnosis not present

## 2023-03-12 DIAGNOSIS — M545 Low back pain, unspecified: Secondary | ICD-10-CM

## 2023-03-12 DIAGNOSIS — M542 Cervicalgia: Secondary | ICD-10-CM | POA: Insufficient documentation

## 2023-03-12 DIAGNOSIS — R293 Abnormal posture: Secondary | ICD-10-CM | POA: Insufficient documentation

## 2023-03-12 DIAGNOSIS — M5416 Radiculopathy, lumbar region: Secondary | ICD-10-CM | POA: Diagnosis not present

## 2023-03-12 DIAGNOSIS — M6281 Muscle weakness (generalized): Secondary | ICD-10-CM | POA: Insufficient documentation

## 2023-03-12 NOTE — Progress Notes (Signed)
Office Visit Note   Patient: Deborah Jacobson           Date of Birth: June 20, 1981           MRN: [id number] Visit Date: 03/12/2023              Requested by: Arnette Felts, FNP [address] [place],  [place] [address] PCP: Arnette Felts, FNP   Assessment & Plan: Visit Diagnoses:  1. Chronic low back pain, unspecified back pain laterality, unspecified whether sciatica present     Plan: Impression is 42 year old female with lumbar facet arthritis. Discussed treatment strategies.  Will send to Dr. Alvester Morin to consider RFA.  Weight loss, PT, OTC meds as needed.    Follow-Up Instructions: No follow-ups on file.   Orders:  No orders of the defined types were placed in this encounter.  No orders of the defined types were placed in this encounter.     Procedures: No procedures performed   Clinical Data: No additional findings.   Subjective: Chief Complaint  Patient presents with   Lower Back - Follow-up    MRI review    HPI  [name] is here today to discuss L spine MRI  Review of Systems  Constitutional: Negative.   HENT: Negative.    Eyes: Negative.   Respiratory: Negative.    Cardiovascular: Negative.   Endocrine: Negative.   Musculoskeletal: Negative.   Neurological: Negative.   Hematological: Negative.   Psychiatric/Behavioral: Negative.    All other systems reviewed and are negative.    Objective: Vital Signs: There were no vitals taken for this visit.  Physical Exam Vitals and nursing note reviewed.  Constitutional:      Appearance: She is well-developed.  HENT:     Head: Normocephalic and atraumatic.  Pulmonary:     Effort: Pulmonary effort is normal.  Abdominal:     Palpations: Abdomen is soft.  Musculoskeletal:     Cervical back: Neck supple.  Skin:    General: Skin is warm.     Capillary Refill: Capillary refill takes less than 2 seconds.  Neurological:     Mental Status: She is alert and oriented to person, place, and  time.  Psychiatric:        Behavior: Behavior normal.        Thought Content: Thought content normal.        Judgment: Judgment normal.     Ortho Exam  Lumbar spine exam is unchanged.  Specialty Comments:  No specialty comments available.  Imaging: No results found.   PMFS History: Patient Active Problem List   Diagnosis Date Noted   Essential hypertension 12/17/2022   Insomnia 12/17/2022   Subacromial bursitis of left shoulder joint 08/29/2020   Tendinopathy of left rotator cuff 08/29/2020   Morbid obesity with BMI of 45.0-49.9, adult (HCC) 08/09/2020   Superior glenoid labrum lesion of left shoulder 07/10/2020   Health maintenance examination 03/23/2019   Prediabetes 2018/12/22   Strain of back December 22, 2018   Death of family member 12/22/2018   Back pain 07/31/2018   Other fatigue 05/31/2018   Chronic low back pain 11/30/2017   Shifting sleep-work schedule 06/15/2017   Paradoxical insomnia 06/15/2017   Sleep related headaches 06/15/2017   Super obese 06/15/2017   Mood complaints in sleep disorder 06/15/2017   Plantar fasciitis of left foot 03/01/2014   Porokeratosis 03/01/2014   Pain in lower limb 03/01/2014   Past Medical History:  Diagnosis Date   Acid  reflux    Diabetes in pregnancy    Hypertension    Seasonal allergies     Family History  Problem Relation Age of Onset   Diabetes Mother    Liver disease Mother    Hypertension Mother     Past Surgical History:  Procedure Laterality Date   CESAREAN SECTION     SHOULDER ARTHROSCOPY WITH BICEPS TENDON REPAIR Left 08/29/2020   Procedure: LEFT SHOULDER ARTHROSCOPY WITH BICEPS TENODESIS;  Surgeon: Tarry Kos, MD;  Location: [facility];  Service: Orthopedics;  Laterality: Left;   TUBAL LIGATION     Social History   Occupational History   Not on file  Tobacco Use   Smoking status: Former   Smokeless tobacco: Never  Vaping Use   Vaping Use: Never used  Substance and Sexual Activity    Alcohol use: No   Drug use: No   Sexual activity: Yes    Birth control/protection: None

## 2023-03-16 ENCOUNTER — Ambulatory Visit: Payer: 59 | Admitting: Physical Therapy

## 2023-03-17 ENCOUNTER — Encounter: Payer: Self-pay | Admitting: Orthopaedic Surgery

## 2023-03-17 ENCOUNTER — Other Ambulatory Visit: Payer: Self-pay | Admitting: Physician Assistant

## 2023-03-17 NOTE — Telephone Encounter (Signed)
Which medicine?

## 2023-03-18 ENCOUNTER — Other Ambulatory Visit: Payer: Self-pay | Admitting: Physician Assistant

## 2023-03-18 MED ORDER — CYCLOBENZAPRINE HCL 5 MG PO TABS: 5.0000 mg | ORAL_TABLET | Freq: Two times a day (BID) | ORAL | 0 refills | Status: DC | PRN

## 2023-03-18 NOTE — Telephone Encounter (Signed)
Sent in meds.  Can we put in referral if we have not already?

## 2023-03-19 ENCOUNTER — Encounter: Payer: 59 | Admitting: Physical Therapy

## 2023-03-23 ENCOUNTER — Encounter: Payer: 59 | Admitting: Physical Therapy

## 2023-03-23 ENCOUNTER — Ambulatory Visit: Payer: 59 | Admitting: Physical Medicine and Rehabilitation

## 2023-03-24 ENCOUNTER — Ambulatory Visit (INDEPENDENT_AMBULATORY_CARE_PROVIDER_SITE_OTHER): Payer: 59 | Admitting: Physical Medicine and Rehabilitation

## 2023-03-24 ENCOUNTER — Encounter: Payer: Self-pay | Admitting: Physical Medicine and Rehabilitation

## 2023-03-24 DIAGNOSIS — M7918 Myalgia, other site: Secondary | ICD-10-CM | POA: Diagnosis not present

## 2023-03-24 DIAGNOSIS — M47816 Spondylosis without myelopathy or radiculopathy, lumbar region: Secondary | ICD-10-CM

## 2023-03-24 DIAGNOSIS — M545 Low back pain, unspecified: Secondary | ICD-10-CM | POA: Diagnosis not present

## 2023-03-24 DIAGNOSIS — G8929 Other chronic pain: Secondary | ICD-10-CM

## 2023-03-24 MED ORDER — DIAZEPAM 5 MG PO TABS: ORAL_TABLET | ORAL | 0 refills | Status: DC

## 2023-03-24 NOTE — Progress Notes (Unsigned)
Functional Pain Scale - descriptive words and definitions  Distracting (5)    Aware of pain/able to complete some ADL's but limited by pain/sleep is affected and active distractions are only slightly useful. Moderate range order  Average Pain  varies  Lower back pain that goes across the back and radiates into both legs. Sitting or standing too long makes pain worse. Pain medication, cream and heating pad can make pain better

## 2023-03-24 NOTE — Progress Notes (Unsigned)
Deborah Jacobson - 42 y.o. female MRN [id number]  Date of birth: Aug 05, 1981  Office Visit Note: Visit Date: 03/24/2023 PCP: Arnette Felts, FNP Referred by: Arnette Felts, FNP  Subjective: Chief Complaint  Patient presents with   Lower Back - Pain   HPI: Deborah Jacobson is a 42 y.o. female who comes in today per the request of Dr. Glee Arvin for evaluation of chronic, worsening and severe bilateral lower back. Some referral of pain to bilateral thighs. Patient reports diffuse pain to entire back and left shoulder, however lower back seems to be most severe. Pain ongoing for for several months, worsens with movement and activity, prolonged sitting and standing. Severe pain when moving from sitting to standing position. She describes pain as sharp, shooting and numbness. Some relief of pain with home exercise regimen, heating pad, topical pain creams, rest and use of medications. She is currently attending formal physical therapy at [facility]. She reports some relief pain with physical therapy. Recent lumbar MRI imaging exhibits multi level facet hypertrophy, most severe on the left at L5-S1. No nerve impingement or spinal canal stenosis noted. No history of lumbar surgery/injections. Patient currently working as Lawyer at [employer], reports difficulty performing job tasks due to severe pain. Patient denies focal weakness. No recent trauma or falls.       Review of Systems  Musculoskeletal:  Positive for back pain.  Neurological:  Positive for tingling. Negative for sensory change, focal weakness and weakness.  All other systems reviewed and are negative.  Otherwise per HPI.  Assessment & Plan: Visit Diagnoses:    ICD-10-CM   1. Chronic bilateral low back pain without sciatica  M54.50 Ambulatory referral to Physical Medicine Rehab   G89.29     2. Facet arthropathy, lumbar  M47.816 Ambulatory referral to Physical Medicine Rehab    3. Myofascial pain  syndrome  M79.18 Ambulatory referral to Physical Medicine Rehab       Plan: Findings:  1. Chronic, worsening and severe bilateral lower back pain. Intermittent referral to bilateral thighs. Patient continues to have severe pain despite good conservative therapies such as formal physical therapy, home exercise regimen, rest and use of medications. Patients clinical presentation and exam are consistent with facet mediated pain. Intermittent pain radiating to thighs could be more of facet joint syndrome. She does have pain with lumbar extension upon exam today. Next step is to perform diagnostic bilateral L5-S1 medial branch blocks under fluoroscopic guidance. If good relief of pain with diagnostic facet blocks we discussed possibility of longer sustained pain with radiofrequency ablation. Dr. Alvester Morin and myself discussed injection procedure with patient today in detail, she has no questions at this time. Patient voiced anxiety related to injection procedure, I did prescribe pre-procedure Valium for her to take on day of injection. No red flag symptoms noted upon exam today.   2. Diffuse generalized pain to entire back and left shoulder. Tenderness noted to bilateral thoracic paraspinal region upon exam today consistent with myofascial pain syndrome, can't rule out central sensitization syndrome such as fibromyalgia. Recommend continuing with formal physical therapy, feel she would benefit from manual treatments and dry needling.  Patient is currently undergoing physical therapy and continues with directed home exercise program.  Current medication management is not beneficial in increasing his functional status.  Please note that procedures are done as part of a comprehensive orthopedic and pain management program with access to in-house orthopedics, spine surgery and physical therapy as well as  access to [facility] biopsychosocial counseling if needed.   Dr. Alvester Morin participated with direct  patient care including clinical review, exam when needed and significant portion of diagnostic and treatment plan.     Meds & Orders:  Meds ordered this encounter  Medications   diazepam (VALIUM) 5 MG tablet    Sig: Take one tablet by mouth with food one hour prior to procedure. May repeat 30 minutes prior if needed.    Dispense:  2 tablet    Refill:  0    Orders Placed This Encounter  Procedures   Ambulatory referral to Physical Medicine Rehab    Follow-up: Return for Bilateral L5-S1 medial branch blocks.   Procedures: No procedures performed      Clinical History: MRI LUMBAR SPINE WITHOUT CONTRAST   TECHNIQUE: Multiplanar, multisequence MR imaging of the lumbar spine was performed. No intravenous contrast was administered.   COMPARISON:  Lumbar radiographs 01/20/2023.   FINDINGS: Segmentation:  Normal on the comparison.   Alignment: Maintained lumbar lordosis. No significant scoliosis or spondylolisthesis.   Vertebrae: No marrow edema or evidence of acute osseous abnormality. Visualized bone marrow signal is within normal limits. Intact visible sacrum and SI joints.   Conus medullaris and cauda equina: Conus extends to the L1 level. No lower spinal cord or conus signal abnormality. Unremarkable cauda equina nerve roots. Lumbar epidural space appears normal.   Paraspinal and other soft tissues: Visualized abdominal viscera and paraspinal soft tissues are within normal limits. Partially visible simple appearing 4.2 cm right pelvic cyst, probably ovarian in origin (series 4, image 1). No follow-up imaging is recommended. Reference: JACR 2020 Feb;17(2):248-254   Otherwise negative visible pelvic viscera.   Disc levels:   Visible lower thoracic levels through T12-L1 appear negative.   L1-L2:  Negative.   L2-L3:  Negative disc.  Up to mild facet hypertrophy.  No stenosis.   L3-L4: Subtle disc desiccation. No disc bulging. No facet hypertrophy. No stenosis.    L4-L5: Subtle disc desiccation. No disc bulging. Mild facet and ligament flavum hypertrophy. No stenosis.   L5-S1: Negative disc. Mild to moderate facet hypertrophy greater on the left. Mild epidural lipomatosis. No significant stenosis.   IMPRESSION: No osseous abnormality. Only subtle lumbar disc degeneration. Intermittent Lumbar facet hypertrophy which is mild, except moderate on the left at L5-S1. No lumbar spinal stenosis or convincing neural impingement.     Electronically Signed   By: Odessa Fleming M.D.   On: 03/02/2023 07:23   She reports that she has quit smoking. She has never used smokeless tobacco.  Recent Labs    08/13/22 1438 12/17/22 1020  HGBA1C 5.9* 6.2*    Objective:  VS:  HT:    WT:   BMI:     BP:   HR: bpm  TEMP: ( )  RESP:  Physical Exam Vitals and nursing note reviewed.  HENT:     Head: Normocephalic and atraumatic.     Right Ear: External ear normal.     Left Ear: External ear normal.     Nose: Nose normal.     Mouth/Throat:     Mouth: Mucous membranes are moist.  Eyes:     Extraocular Movements: Extraocular movements intact.  Cardiovascular:     Rate and Rhythm: Normal rate.     Pulses: Normal pulses.  Pulmonary:     Effort: Pulmonary effort is normal.  Abdominal:     General: Abdomen is flat. There is no distension.  Musculoskeletal:  General: Tenderness present.     Cervical back: Normal range of motion.     Comments: Patient rises from seated position to standing without difficulty. Concordant low back pain with facet loading, lumbar spine extension and rotation. 5/5 strength noted with bilateral hip flexion, knee flexion/extension, ankle dorsiflexion/plantarflexion and EHL. No clonus noted bilaterally. No pain upon palpation of greater trochanters. No pain with internal/external rotation of bilateral hips. Sensation intact bilaterally. Negative slump test bilaterally. Ambulates without aid, gait steady.      Skin:    General: Skin  is warm and dry.     Capillary Refill: Capillary refill takes less than 2 seconds.  Neurological:     General: No focal deficit present.     Mental Status: She is alert and oriented to person, place, and time.  Psychiatric:        Mood and Affect: Mood normal.        Behavior: Behavior normal.     Ortho Exam  Imaging: No results found.  Past Medical/Family/Surgical/Social History: Medications & Allergies reviewed per EMR, new medications updated. Patient Active Problem List   Diagnosis Date Noted   Essential hypertension 12/17/2022   Insomnia 12/17/2022   Subacromial bursitis of left shoulder joint 08/29/2020   Tendinopathy of left rotator cuff 08/29/2020   Morbid obesity with BMI of 45.0-49.9, adult (HCC) 08/09/2020   Superior glenoid labrum lesion of left shoulder 07/10/2020   Health maintenance examination 03/23/2019   Prediabetes 12/06/18   Strain of back December 06, 2018   Death of family member 12/06/2018   Back pain 07/31/2018   Other fatigue 05/31/2018   Chronic low back pain 11/30/2017   Shifting sleep-work schedule 06/15/2017   Paradoxical insomnia 06/15/2017   Sleep related headaches 06/15/2017   Super obese 06/15/2017   Mood complaints in sleep disorder 06/15/2017   Plantar fasciitis of left foot 03/01/2014   Porokeratosis 03/01/2014   Pain in lower limb 03/01/2014   Past Medical History:  Diagnosis Date   Acid reflux    Diabetes in pregnancy    Hypertension    Seasonal allergies    Family History  Problem Relation Age of Onset   Diabetes Mother    Liver disease Mother    Hypertension Mother    Past Surgical History:  Procedure Laterality Date   CESAREAN SECTION     SHOULDER ARTHROSCOPY WITH BICEPS TENDON REPAIR Left 08/29/2020   Procedure: LEFT SHOULDER ARTHROSCOPY WITH BICEPS TENODESIS;  Surgeon: Tarry Kos, MD;  Location: [facility];  Service: Orthopedics;  Laterality: Left;   TUBAL LIGATION     Social History   Occupational  History   Not on file  Tobacco Use   Smoking status: Former   Smokeless tobacco: Never  Vaping Use   Vaping Use: Never used  Substance and Sexual Activity   Alcohol use: No   Drug use: No   Sexual activity: Yes    Birth control/protection: None

## 2023-03-25 NOTE — Therapy (Addendum)
OUTPATIENT PHYSICAL THERAPY TREATMENT NOTE DISCHARGE   Patient Name: Deborah Jacobson MRN: [id number] DOB:11-30-1980, 42 y.o., female Today's Date: 03/26/2023  PCP: Arnette Felts FNP  REFERRING PROVIDER: Cristie Hem, PA-C  END OF SESSION:   PT End of Session - 03/26/23 0937     Visit Number 5    Number of Visits 16    Date for PT Re-Evaluation 04/20/23    Authorization Type [employer]    Authorization Time Period MCD [employer] not filing    PT Start Time 0935    PT Stop Time 1019    PT Time Calculation (min) 44 min    Activity Tolerance Patient tolerated treatment well    Behavior During Therapy WFL for tasks assessed/performed               Past Medical History:  Diagnosis Date   Acid reflux    Diabetes in pregnancy    Hypertension    Seasonal allergies    Past Surgical History:  Procedure Laterality Date   CESAREAN SECTION     SHOULDER ARTHROSCOPY WITH BICEPS TENDON REPAIR Left 08/29/2020   Procedure: LEFT SHOULDER ARTHROSCOPY WITH BICEPS TENODESIS;  Surgeon: Tarry Kos, MD;  Location: [facility];  Service: Orthopedics;  Laterality: Left;   TUBAL LIGATION     Patient Active Problem List   Diagnosis Date Noted   Essential hypertension 12/17/2022   Insomnia 12/17/2022   Subacromial bursitis of left shoulder joint 08/29/2020   Tendinopathy of left rotator cuff 08/29/2020   Morbid obesity with BMI of 45.0-49.9, adult (HCC) 08/09/2020   Superior glenoid labrum lesion of left shoulder 07/10/2020   Health maintenance examination 03/23/2019   Prediabetes December 24, 2018   Strain of back December 24, 2018   Death of family member 2018-12-24   Back pain 07/31/2018   Other fatigue 05/31/2018   Chronic low back pain 11/30/2017   Shifting sleep-work schedule 06/15/2017   Paradoxical insomnia 06/15/2017   Sleep related headaches 06/15/2017   Super obese 06/15/2017   Mood complaints in sleep disorder 06/15/2017   Plantar fasciitis of left foot 03/01/2014    Porokeratosis 03/01/2014   Pain in lower limb 03/01/2014    REFERRING DIAG:  M54.2 (ICD-10-CM) - Neck pain  M54.50 (ICD-10-CM) - Acute midline low back pain, unspecified whether sciatica present    THERAPY DIAG:  Radiculopathy, lumbar region  Abnormal posture  Cervicalgia  Pain in thoracic spine  Chronic bilateral low back pain without sciatica  Muscle weakness (generalized)  Rationale for Evaluation and Treatment Rehabilitation  PERTINENT HISTORY: see above  PRECAUTIONS: none   SUBJECTIVE:  SUBJECTIVE STATEMENT:Saw Dr. Alvester Morin.  She was improved to get the injections.  She was on a trip and noticed it was hard for her to lift her LLE but she has been working on her HEP.      PAIN:  Are you having pain? Yes: NPRS scale: 2/10 Pain location: all over but back and Rt LE  Pain description: stiff, sore  Aggravating factors: sitting, constant  Relieving factors: not sure, took meds, Epsom salt bath    OBJECTIVE: (objective measures completed at initial evaluation unless otherwise dated)  DIAGNOSTIC FINDINGS:    MRI: 03/02/23 Impression:  No osseous abnormality. Only subtle lumbar disc degeneration. Intermittent Lumbar facet hypertrophy which is mild, except moderate on the left at L5-S1. No lumbar spinal stenosis or convincing neural impingement.      Moderate degenerative changes to the lower thoracic and upper lumbar spine    PATIENT SURVEYS:  FOTO emailed  62% Neck-taken visit 3 59% lumbar- taken visit 3   SCREENING FOR RED FLAGS: Bowel or bladder incontinence: No Spinal tumors: No Cauda equina syndrome: No Compression fracture: No Abdominal aneurysm: No   COGNITION: Overall cognitive status: Within functional limits for tasks assessed                           SENSATION: WFL   POSTURE: No Significant postural limitations, rounded shoulders, increased lumbar lordosis, anterior pelvic tilt, and genu recurvatum and genu valgus   PALPATION: Pain along Rt > Lt. Low lumbar paraspinals, Rt glute    LUMBAR ROM:    AROM Eval 02/23/23 03/26/23  Flexion Pain, limited to distal Stretch, hands to ankles  Extension Limited 50%, pain 15-20% limited , stretch  Right lateral flexion Pain on Rt  No pain   Left lateral flexion Pain on Rt  No pain   Right rotation Pain on Rt WNL  Left rotation WFL  WNL    (Blank rows = not tested)    LOWER EXTREMITY ROM:    Passive  Right eval Left eval  Hip flexion      Hip extension      Hip abduction      Hip adduction      Hip internal rotation Tight  Tight   Hip external rotation Tight Tight   Knee flexion      Knee extension      Ankle dorsiflexion      Ankle plantarflexion      Ankle inversion      Ankle eversion       (Blank rows = not tested)   LOWER EXTREMITY MMT:     MMT Right eval Left eval  Hip flexion 4- 4-  Hip extension      Hip abduction      Hip adduction      Hip internal rotation      Hip external rotation      Knee flexion 4 5  Knee extension 4+ 4+  Ankle dorsiflexion 4+ 5  Ankle plantarflexion      Ankle inversion      Ankle eversion       (Blank rows = not tested)   LUMBAR SPECIAL TESTS:  Straight leg raise test: Negative LE pain on Rt> Lt.    FUNCTIONAL TESTS:  NT on eval    GAIT: Distance walked: 150 Assistive device utilized: None Level of assistance: Complete Independence Comments: none noted on eval    TODAY'S TREATMENT:  [facility] Adult PT Treatment:                                                DATE: 03/26/23 Therapeutic Exercise: NuStep L5 UE and LE  Trunk AROM  Supine LTR with legs on physioball  x 10 for oblique  Hamstring curl with ball , core focus  Bridge with ball x 10  Stretch hamstring, outer hip x 3 each side Sit to stand with chest press  10 lbs x 10  Hip hinge 10 lbs x 10 RDL , set 2 with 15 lbs , min cues  Tandem core on Airex pass 10 lbs KB x 30 sec 2 x   [facility] Adult PT Treatment:                                                DATE: 03/12/23 Therapeutic Exercise: Cat and camel to child's pose  Supine LTR x 10  Rt ant groin stretch off edge of table added L knee to chest with towel  Posterior pelvic tilt x 10  Tilt to bridge x 10 Standing wall mini squat x 30 sec Iso mini squat with dumbbell press  6 lbs x 10  Standing core on Airex dumbbell pass lateral, figure 8 and rowing NuStep L5 UE and LE for 8 min  Manual Therapy: LAD Rt LE  Self Care: Discussed need to do gentle stretching regardless of how her back and leg feel Body mechanics at work are more likely the culprit of her increased back pain     [facility] Adult PT Treatment:                                                DATE: 03/09/23 Therapeutic Exercise: Hip abduction  Hip flexion Squat at counter Heel raises  Hip flexor stretch standing Seated lumbar flexion stretch with ball  Supine hamstring stretch +ITB stretch with strap  Open books , top leg dropped over bottom- modified arm to chest for overpressure  Side clam x 15 PPT x 15  PPT to bridge        PATIENT EDUCATION:  Education details: PT/POC, HEP, anatomy, effect of weight  Person educated: Patient Education method: Explanation, Demonstration, Verbal cues, and Handouts Education comprehension: verbalized understanding, returned demonstration, and needs further education   HOME EXERCISE PROGRAM: Access Code: [email or web address] URL: [email or web address] Date: 02/23/2023 Prepared by: Karie Mainland   Exercises - Supine Lower Trunk Rotation  - 1-2 x daily - 7 x weekly - 2 sets - 10 reps - 10 hold - Supine Posterior Pelvic Tilt  - 1-2 x daily - 7 x weekly - 2 sets - 10 reps - 10 hold - Standing Hip Flexor Stretch  - 1-2 x daily - 7 x weekly - 1 sets - 3 reps - 30 hold -clam  -Squat   Cat  and camel to child's pose   ASSESSMENT:   CLINICAL IMPRESSION: Patient as not limited by pain during session.  She is noting overall less pain but still limited in ability to stand and walk, lift.  She  is feeling a bit nervous about her injections but she will find benefit from it. She is doing her HEP consistently and being mindful of her posture and lifting techniques.      OBJECTIVE IMPAIRMENTS: decreased activity tolerance, decreased knowledge of condition, decreased mobility, difficulty walking, decreased ROM, decreased strength, increased fascial restrictions, increased muscle spasms, impaired flexibility, improper body mechanics, postural dysfunction, obesity, and pain.    ACTIVITY LIMITATIONS: carrying, lifting, bending, sitting, standing, squatting, sleeping, stairs, locomotion level, and caring for others   PARTICIPATION LIMITATIONS: meal prep, cleaning, laundry, interpersonal relationship, shopping, community activity, occupation, and church   PERSONAL FACTORS: Profession, Time since onset of injury/illness/exacerbation, and 3+ comorbidities: obesity, chronic pain, previous surgery   are also affecting patient's functional outcome.    REHAB POTENTIAL: Good   CLINICAL DECISION MAKING: Evolving/moderate complexity   EVALUATION COMPLEXITY: Moderate     GOALS: Goals reviewed with patient? Yes   SHORT TERM GOALS: Target date: 03/23/2023     Pt will be I with HEP for neck and back flexibility, core strength Baseline: unknown  Goal status: ongoing    2.  Pt will be able to stand for ADLs without increasing LE pain for short periods Baseline: painful, moderate  Goal status: ongoing    3. Patient will complete FOTO and set goal for DC             Baseline: 46% eval            Goal status: MET   3.  Pt will be able to transition on and off the mat, bed without increasing pain  Baseline:cues needed  Goal status: ongoing            LONG TERM GOALS: Target date:  04/20/2023     Pt will be I with HEP for core, hips  Baseline:  Goal status: INITIAL   2.  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  Pt will be able to report sleep with 50% greater ease due to pain  Baseline:  Goal status: INITIAL   4.  Pt will be able to don shoes and socks by crossing legs vs deep squat  Baseline:  Goal status: INITIAL   5. FOTO score will improve to 65% (lumbar) to demo improved functional mobility             Baseline: unknown             Goal status: INITIAL    PLAN:   PT FREQUENCY: 2x/week   PT DURATION: 8 weeks   PLANNED INTERVENTIONS: Therapeutic exercises, Therapeutic activity, Neuromuscular re-education, Balance training, Gait training, Patient/Family education, Self Care, Joint mobilization, Dry Needling, Electrical stimulation, Cryotherapy, Moist heat, Manual therapy, and Re-evaluation.   PLAN FOR NEXT SESSION: cont to progress HEP, Body mechanics, hinge, lift  . Wall for core and LE    Karie Mainland, PT 03/26/23 10:19 AM Phone: [phone] Fax: [phone]     PHYSICAL THERAPY DISCHARGE SUMMARY  Visits from Start of Care: 5  Current functional level related to goals / functional outcomes: See above   Remaining deficits: unknown   Education / Equipment: HEP  , posture and body mech.   Patient agrees to discharge. Patient goals were partially met. Patient is being discharged due to not returning since the last visit.  Karie Mainland, PT 05/12/23 12:16 PM Phone: [phone] Fax: [phone]

## 2023-03-26 ENCOUNTER — Ambulatory Visit: Payer: 59 | Admitting: Physical Therapy

## 2023-03-26 ENCOUNTER — Encounter: Payer: Self-pay | Admitting: Physical Therapy

## 2023-03-26 DIAGNOSIS — M5416 Radiculopathy, lumbar region: Secondary | ICD-10-CM

## 2023-03-26 DIAGNOSIS — R293 Abnormal posture: Secondary | ICD-10-CM | POA: Diagnosis not present

## 2023-03-26 DIAGNOSIS — M546 Pain in thoracic spine: Secondary | ICD-10-CM | POA: Diagnosis not present

## 2023-03-26 DIAGNOSIS — G8929 Other chronic pain: Secondary | ICD-10-CM

## 2023-03-26 DIAGNOSIS — M6281 Muscle weakness (generalized): Secondary | ICD-10-CM

## 2023-03-26 DIAGNOSIS — M542 Cervicalgia: Secondary | ICD-10-CM

## 2023-03-26 DIAGNOSIS — M545 Low back pain, unspecified: Secondary | ICD-10-CM | POA: Diagnosis not present

## 2023-03-31 ENCOUNTER — Ambulatory Visit (INDEPENDENT_AMBULATORY_CARE_PROVIDER_SITE_OTHER): Payer: 59 | Admitting: Physical Medicine and Rehabilitation

## 2023-03-31 ENCOUNTER — Other Ambulatory Visit: Payer: Self-pay

## 2023-03-31 ENCOUNTER — Encounter: Payer: Self-pay | Admitting: Physical Medicine and Rehabilitation

## 2023-03-31 ENCOUNTER — Other Ambulatory Visit: Payer: Self-pay | Admitting: Nurse Practitioner

## 2023-03-31 DIAGNOSIS — M47816 Spondylosis without myelopathy or radiculopathy, lumbar region: Secondary | ICD-10-CM

## 2023-03-31 DIAGNOSIS — R7303 Prediabetes: Secondary | ICD-10-CM

## 2023-03-31 MED ORDER — BUPIVACAINE HCL 0.25 % IJ SOLN
2.0000 mL | Freq: Once | INTRAMUSCULAR | Status: AC
Start: 2023-03-31 — End: 2023-03-31
  Administered 2023-03-31: 2 mL

## 2023-03-31 NOTE — Patient Instructions (Signed)

## 2023-04-01 ENCOUNTER — Other Ambulatory Visit: Payer: Self-pay | Admitting: Physical Medicine and Rehabilitation

## 2023-04-01 DIAGNOSIS — M545 Low back pain, unspecified: Secondary | ICD-10-CM

## 2023-04-01 DIAGNOSIS — M47816 Spondylosis without myelopathy or radiculopathy, lumbar region: Secondary | ICD-10-CM

## 2023-04-01 MED ORDER — ZOLPIDEM TARTRATE 10 MG PO TABS: ORAL_TABLET | ORAL | 0 refills | Status: DC

## 2023-04-01 NOTE — Progress Notes (Signed)
[name] - 42 y.o. female MRN [id number]  Date of birth: 1981-03-13  Office Visit Note: Visit Date: 03/31/2023 PCP: Arnette Felts, FNP Referred by: Arnette Felts, FNP  Subjective: Chief Complaint  Patient presents with   Lower Back - Pain   HPI:  Deborah Jacobson is a 42 y.o. female who comes in today at the request of Ellin Goodie, FNP for planned Bilateral  L5-S1 Lumbar facet/medial branch block with fluoroscopic guidance.  The patient has failed conservative care including home exercise, medications, time and activity modification.  This injection will be diagnostic and hopefully therapeutic.  Please see requesting physician notes for further details and justification.  Exam has shown concordant pain with facet joint loading.   ROS Otherwise per HPI.  Assessment & Plan: Visit Diagnoses:    ICD-10-CM   1. Spondylosis without myelopathy or radiculopathy, lumbar region  M47.816 XR C-ARM NO REPORT    Facet Injection    bupivacaine (MARCAINE) 0.25 % (with pres) injection 2 mL      Plan: No additional findings.   Meds & Orders:  Meds ordered this encounter  Medications   bupivacaine (MARCAINE) 0.25 % (with pres) injection 2 mL    Orders Placed This Encounter  Procedures   Facet Injection   XR C-ARM NO REPORT    Follow-up: Return for Review Pain Diary.   Procedures: No procedures performed  Lumbar Diagnostic Facet Joint Nerve Block with Fluoroscopic Guidance   Patient: Deborah Jacobson      Date of Birth: 08-21-1981 MRN: [id number] PCP: Arnette Felts, FNP      Visit Date: 03/31/2023   Universal Protocol:    Date/Time: 05/22/245:10 AM  Consent Given By: the patient  Position: PRONE  Additional Comments: Vital signs were monitored before and after the procedure. Patient was prepped and draped in the usual sterile fashion. The correct patient, procedure, and site was verified.   Injection Procedure Details:   Procedure diagnoses:  1. Spondylosis  without myelopathy or radiculopathy, lumbar region      Meds Administered:  Meds ordered this encounter  Medications   bupivacaine (MARCAINE) 0.25 % (with pres) injection 2 mL     Laterality: Bilateral  Location/Site: L5-S1, L4 medial branch and L5 dorsal ramus  Needle: 5.0 in., 25 ga.  Short bevel or Quincke spinal needle  Needle Placement: Oblique pedical  Findings:   -Comments: There was excellent flow of contrast along the articular pillars without intravascular flow.  Procedure Details: The fluoroscope beam is vertically oriented in AP and then obliqued 15 to 20 degrees to the ipsilateral side of the desired nerve to achieve the "Scotty dog" appearance.  The skin over the target area of the junction of the superior articulating process and the transverse process (sacral ala if blocking the L5 dorsal rami) was locally anesthetized with a 1 ml volume of 1% Lidocaine without Epinephrine.  The spinal needle was inserted and advanced in a trajectory view down to the target.   After contact with periosteum and negative aspirate for blood and CSF, correct placement without intravascular or epidural spread was confirmed by injecting 0.5 ml. of Isovue-250.  A spot radiograph was obtained of this image.    Next, a 0.5 ml. volume of the injectate described above was injected. The needle was then redirected to the other facet joint nerves mentioned above if needed.  Prior to the procedure, the patient was given a Pain Diary which was completed for baseline measurements.  After the  procedure, the patient rated their pain every 30 minutes and will continue rating at this frequency for a total of 5 hours.  The patient has been asked to complete the Diary and return to [place] by mail, fax or hand delivered as soon as possible.   Additional Comments:  No complications occurred Dressing: 2 x 2 sterile gauze and Band-Aid    Post-procedure details: Patient was observed during the  procedure. Post-procedure instructions were reviewed.  Patient left the clinic in stable condition.   Clinical History: MRI LUMBAR SPINE WITHOUT CONTRAST   TECHNIQUE: Multiplanar, multisequence MR imaging of the lumbar spine was performed. No intravenous contrast was administered.   COMPARISON:  Lumbar radiographs 01/20/2023.   FINDINGS: Segmentation:  Normal on the comparison.   Alignment: Maintained lumbar lordosis. No significant scoliosis or spondylolisthesis.   Vertebrae: No marrow edema or evidence of acute osseous abnormality. Visualized bone marrow signal is within normal limits. Intact visible sacrum and SI joints.   Conus medullaris and cauda equina: Conus extends to the L1 level. No lower spinal cord or conus signal abnormality. Unremarkable cauda equina nerve roots. Lumbar epidural space appears normal.   Paraspinal and other soft tissues: Visualized abdominal viscera and paraspinal soft tissues are within normal limits. Partially visible simple appearing 4.2 cm right pelvic cyst, probably ovarian in origin (series 4, image 1). No follow-up imaging is recommended. Reference: JACR 2020 Feb;17(2):248-254   Otherwise negative visible pelvic viscera.   Disc levels:   Visible lower thoracic levels through T12-L1 appear negative.   L1-L2:  Negative.   L2-L3:  Negative disc.  Up to mild facet hypertrophy.  No stenosis.   L3-L4: Subtle disc desiccation. No disc bulging. No facet hypertrophy. No stenosis.   L4-L5: Subtle disc desiccation. No disc bulging. Mild facet and ligament flavum hypertrophy. No stenosis.   L5-S1: Negative disc. Mild to moderate facet hypertrophy greater on the left. Mild epidural lipomatosis. No significant stenosis.   IMPRESSION: No osseous abnormality. Only subtle lumbar disc degeneration. Intermittent Lumbar facet hypertrophy which is mild, except moderate on the left at L5-S1. No lumbar spinal stenosis or convincing neural  impingement.     Electronically Signed   By: Odessa Fleming M.D.   On: 03/02/2023 07:23     Objective:  VS:  HT:    WT:   BMI:     BP:   HR: bpm  TEMP: ( )  RESP:  Physical Exam Vitals and nursing note reviewed.  Constitutional:      General: She is not in acute distress.    Appearance: Normal appearance. She is obese. She is not ill-appearing.  HENT:     Head: Normocephalic and atraumatic.     Right Ear: External ear normal.     Left Ear: External ear normal.  Eyes:     Extraocular Movements: Extraocular movements intact.  Cardiovascular:     Rate and Rhythm: Normal rate.     Pulses: Normal pulses.  Pulmonary:     Effort: Pulmonary effort is normal. No respiratory distress.  Abdominal:     General: There is no distension.     Palpations: Abdomen is soft.  Musculoskeletal:        General: Tenderness present.     Cervical back: Neck supple.     Right lower leg: No edema.     Left lower leg: No edema.     Comments: Patient has good distal strength with no pain over the greater trochanters.  No clonus or  focal weakness.  Skin:    Findings: No erythema, lesion or rash.  Neurological:     General: No focal deficit present.     Mental Status: She is alert and oriented to person, place, and time.     Sensory: No sensory deficit.     Motor: No weakness or abnormal muscle tone.     Coordination: Coordination normal.  Psychiatric:        Mood and Affect: Mood normal.        Behavior: Behavior normal.      Imaging: XR C-ARM NO REPORT  Result Date: 03/31/2023 Please see Notes tab for imaging impression.

## 2023-04-01 NOTE — Procedures (Signed)
Lumbar Diagnostic Facet Joint Nerve Block with Fluoroscopic Guidance   Patient: Deborah Jacobson      Date of Birth: [birth year] MRN: [id number] PCP: Arnette Felts, FNP      Visit Date: 03/31/2023   Universal Protocol:    Date/Time: 05/22/245:10 AM  Consent Given By: the patient  Position: PRONE  Additional Comments: Vital signs were monitored before and after the procedure. Patient was prepped and draped in the usual sterile fashion. The correct patient, procedure, and site was verified.   Injection Procedure Details:   Procedure diagnoses:  1. Spondylosis without myelopathy or radiculopathy, lumbar region      Meds Administered:  Meds ordered this encounter  Medications   bupivacaine (MARCAINE) 0.25 % (with pres) injection 2 mL     Laterality: Bilateral  Location/Site: L5-S1, L4 medial branch and L5 dorsal ramus  Needle: 5.0 in., 25 ga.  Short bevel or Quincke spinal needle  Needle Placement: Oblique pedical  Findings:   -Comments: There was excellent flow of contrast along the articular pillars without intravascular flow.  Procedure Details: The fluoroscope beam is vertically oriented in AP and then obliqued 15 to 20 degrees to the ipsilateral side of the desired nerve to achieve the "Scotty dog" appearance.  The skin over the target area of the junction of the superior articulating process and the transverse process (sacral ala if blocking the L5 dorsal rami) was locally anesthetized with a 1 ml volume of 1% Lidocaine without Epinephrine.  The spinal needle was inserted and advanced in a trajectory view down to the target.   After contact with periosteum and negative aspirate for blood and CSF, correct placement without intravascular or epidural spread was confirmed by injecting 0.5 ml. of Isovue-250.  A spot radiograph was obtained of this image.    Next, a 0.5 ml. volume of the injectate described above was injected. The needle was then redirected to the other  facet joint nerves mentioned above if needed.  Prior to the procedure, the patient was given a Pain Diary which was completed for baseline measurements.  After the procedure, the patient rated their pain every 30 minutes and will continue rating at this frequency for a total of 5 hours.  The patient has been asked to complete the Diary and return to [place] by mail, fax or hand delivered as soon as possible.   Additional Comments:  No complications occurred Dressing: 2 x 2 sterile gauze and Band-Aid    Post-procedure details: Patient was observed during the procedure. Post-procedure instructions were reviewed.  Patient left the clinic in stable condition.

## 2023-04-03 ENCOUNTER — Other Ambulatory Visit: Payer: Self-pay | Admitting: Podiatry

## 2023-04-08 ENCOUNTER — Encounter: Payer: Self-pay | Admitting: Physical Medicine and Rehabilitation

## 2023-04-09 ENCOUNTER — Encounter: Payer: Self-pay | Admitting: Physical Medicine and Rehabilitation

## 2023-04-09 ENCOUNTER — Other Ambulatory Visit: Payer: Self-pay | Admitting: Physical Medicine and Rehabilitation

## 2023-04-09 MED ORDER — TRAMADOL HCL 50 MG PO TABS: 50.0000 mg | ORAL_TABLET | Freq: Three times a day (TID) | ORAL | 0 refills | Status: DC | PRN

## 2023-04-11 ENCOUNTER — Encounter (HOSPITAL_COMMUNITY): Payer: Self-pay

## 2023-04-11 ENCOUNTER — Ambulatory Visit (HOSPITAL_COMMUNITY)
Admission: EM | Admit: 2023-04-11 | Discharge: 2023-04-11 | Disposition: A | Discharge status: Home / Self Care | Payer: 59 | Attending: Physician Assistant | Admitting: Physician Assistant

## 2023-04-11 DIAGNOSIS — K529 Noninfective gastroenteritis and colitis, unspecified: Secondary | ICD-10-CM | POA: Insufficient documentation

## 2023-04-11 DIAGNOSIS — R112 Nausea with vomiting, unspecified: Secondary | ICD-10-CM | POA: Diagnosis not present

## 2023-04-11 DIAGNOSIS — K2100 Gastro-esophageal reflux disease with esophagitis, without bleeding: Secondary | ICD-10-CM | POA: Insufficient documentation

## 2023-04-11 DIAGNOSIS — R197 Diarrhea, unspecified: Secondary | ICD-10-CM | POA: Diagnosis not present

## 2023-04-11 LAB — CBC WITH DIFFERENTIAL/PLATELET
Abs Immature Granulocytes: 0.02 10*3/uL (ref 0.00–0.07)
Basophils Absolute: 0 10*3/uL (ref 0.0–0.1)
Basophils Relative: 0 %
Eosinophils Absolute: 0 10*3/uL (ref 0.0–0.5)
Eosinophils Relative: 0 %
HCT: 37.3 % (ref 36.0–46.0)
Hemoglobin: 11.6 g/dL — ABNORMAL LOW (ref 12.0–15.0)
Immature Granulocytes: 0 %
Lymphocytes Relative: 17 %
Lymphs Abs: 1 10*3/uL (ref 0.7–4.0)
MCH: 25.9 pg — ABNORMAL LOW (ref 26.0–34.0)
MCHC: 31.1 g/dL (ref 30.0–36.0)
MCV: 83.3 fL (ref 80.0–100.0)
Monocytes Absolute: 0.2 10*3/uL (ref 0.1–1.0)
Monocytes Relative: 4 %
Neutro Abs: 4.4 10*3/uL (ref 1.7–7.7)
Neutrophils Relative %: 79 %
Platelets: 326 10*3/uL (ref 150–400)
RBC: 4.48 MIL/uL (ref 3.87–5.11)
RDW: 15.1 % (ref 11.5–15.5)
WBC: 5.6 10*3/uL (ref 4.0–10.5)
nRBC: 0 % (ref 0.0–0.2)

## 2023-04-11 LAB — POCT URINALYSIS DIP (MANUAL ENTRY)
Bilirubin, UA: NEGATIVE
Glucose, UA: NEGATIVE mg/dL
Ketones, POC UA: NEGATIVE mg/dL
Leukocytes, UA: NEGATIVE
Nitrite, UA: NEGATIVE
Protein Ur, POC: NEGATIVE mg/dL
Spec Grav, UA: 1.025 (ref 1.010–1.025)
Urobilinogen, UA: 0.2 E.U./dL
pH, UA: 5.5 (ref 5.0–8.0)

## 2023-04-11 LAB — COMPREHENSIVE METABOLIC PANEL
ALT: 17 U/L (ref 0–44)
AST: 17 U/L (ref 15–41)
Albumin: 3.6 g/dL (ref 3.5–5.0)
Alkaline Phosphatase: 82 U/L (ref 38–126)
Anion gap: 8 (ref 5–15)
BUN: 8 mg/dL (ref 6–20)
CO2: 28 mmol/L (ref 22–32)
Calcium: 8.8 mg/dL — ABNORMAL LOW (ref 8.9–10.3)
Chloride: 97 mmol/L — ABNORMAL LOW (ref 98–111)
Creatinine, Ser: 0.75 mg/dL (ref 0.44–1.00)
GFR, Estimated: >60 mL/min (ref >60)
Glucose, Bld: 94 mg/dL (ref 70–99)
Potassium: 3.1 mmol/L — ABNORMAL LOW (ref 3.5–5.1)
Sodium: 133 mmol/L — ABNORMAL LOW (ref 135–145)
Total Bilirubin: 0.6 mg/dL (ref 0.3–1.2)
Total Protein: 7.8 g/dL (ref 6.5–8.1)

## 2023-04-11 LAB — LIPASE, BLOOD: Lipase: 22 U/L (ref 11–51)

## 2023-04-11 LAB — POCT URINE PREGNANCY: Preg Test, Ur: NEGATIVE

## 2023-04-11 MED ORDER — ALUM & MAG HYDROXIDE-SIMETH 200-200-20 MG/5ML PO SUSP
30.0000 mL | Freq: Once | ORAL | Status: AC
  Administered 2023-04-11: 30 mL via ORAL

## 2023-04-11 MED ORDER — SUCRALFATE 1 G PO TABS: 1.0000 g | ORAL_TABLET | Freq: Three times a day (TID) | ORAL | 0 refills | Status: DC

## 2023-04-11 MED ORDER — ONDANSETRON 4 MG PO TBDP: 4.0000 mg | ORAL_TABLET | Freq: Three times a day (TID) | ORAL | 0 refills | Status: DC | PRN

## 2023-04-11 MED ORDER — ALUM & MAG HYDROXIDE-SIMETH 200-200-20 MG/5ML PO SUSP
ORAL | Status: AC
  Filled 2023-04-11: qty 30

## 2023-04-11 MED ORDER — ONDANSETRON 4 MG PO TBDP
ORAL_TABLET | ORAL | Status: AC
  Filled 2023-04-11: qty 1

## 2023-04-11 MED ORDER — ONDANSETRON 4 MG PO TBDP
4.0000 mg | ORAL_TABLET | Freq: Once | ORAL | Status: AC
  Administered 2023-04-11: 4 mg via ORAL

## 2023-04-11 NOTE — ED Triage Notes (Signed)
Patient here today with c/o N&V, diarrhea, weakness, chills, HA, fatigue last night. Her daughter has also been vomiting. She went to [employer] last month.

## 2023-04-11 NOTE — ED Provider Notes (Signed)
[facility]    CSN: [id number] Arrival date & time: 04/11/23  1001      History   Chief Complaint Chief Complaint  Patient presents with   Nausea    Vomiting    HPI Deborah Jacobson is a 42 y.o. female.   Patient presents today with a 24-hour history of GI symptoms.  She reports generalized abdominal pain, nausea, vomiting, chills, weakness, fatigue, diarrhea.  Denies any known sick contacts.  Denies any recent travel but did go to [facility] approximately 1 month ago.  Denies any recent medication changes or antibiotic use.  Reports last episode of emesis was earlier today and denies any associated hematemesis.  She has had 4 watery bowel movements in the past 24 hours but denies any associated melena or hematochezia.  Denies history of gastrointestinal disorder including ulcerative colitis or Crohn's disease.  Denies previous abdominal surgery and still has gallbladder and appendix.  Denies any urinary or vaginal symptoms.  She has not had a fever but has had chills.  Denies any associated URI symptoms including cough, congestion.  She has not tried any over-the-counter medication for symptom management.  She has been able to sip on seltzer water but has not had any solid food intake since symptoms began.    Past Medical History:  Diagnosis Date   Acid reflux    Diabetes in pregnancy    Hypertension    Seasonal allergies     Patient Active Problem List   Diagnosis Date Noted   Essential hypertension 12/17/2022   Insomnia 12/17/2022   Subacromial bursitis of left shoulder joint 08/29/2020   Tendinopathy of left rotator cuff 08/29/2020   Morbid obesity with BMI of 45.0-49.9, adult (HCC) 08/09/2020   Superior glenoid labrum lesion of left shoulder 07/10/2020   Health maintenance examination 03/23/2019   Prediabetes January 01, 2019   Strain of back 2019/01/01   Death of family member 01-01-19   Back pain 07/31/2018   Other fatigue 05/31/2018   Chronic low back  pain 11/30/2017   Shifting sleep-work schedule 06/15/2017   Paradoxical insomnia 06/15/2017   Sleep related headaches 06/15/2017   Super obese 06/15/2017   Mood complaints in sleep disorder 06/15/2017   Plantar fasciitis of left foot 03/01/2014   Porokeratosis 03/01/2014   Pain in lower limb 03/01/2014    Past Surgical History:  Procedure Laterality Date   CESAREAN SECTION     SHOULDER ARTHROSCOPY WITH BICEPS TENDON REPAIR Left 08/29/2020   Procedure: LEFT SHOULDER ARTHROSCOPY WITH BICEPS TENODESIS;  Surgeon: Tarry Kos, MD;  Location: [facility];  Service: Orthopedics;  Laterality: Left;   TUBAL LIGATION      OB History     Gravida  6   Para  4   Term  4   Preterm      AB  2   Living  4      SAB  1   IAB  1   Ectopic      Multiple      Live Births  4            Home Medications    Prior to Admission medications   Medication Sig Start Date End Date Taking? Authorizing Provider  cyclobenzaprine (FLEXERIL) 5 MG tablet Take 1 tablet (5 mg total) by mouth 2 (two) times daily as needed for muscle spasms. 03/18/23  Yes Cristie Hem, PA-C  diazepam (VALIUM) 5 MG tablet Take one tablet by mouth with food  one hour prior to procedure. May repeat 30 minutes prior if needed. 03/24/23  Yes Ellin Goodie E, NP  hydrochlorothiazide (HYDRODIURIL) 12.5 MG tablet TAKE 1 TABLET (12.5 MG) BY MOUTH DAILY 02/20/23  Yes Arnette Felts, FNP  meloxicam (MOBIC) 15 MG tablet TAKE 1/2 TO 1 TABLET BY MOUTH DAILY AS NEEDED FOR PAIN 04/03/23  Yes Felecia Shelling, DPM  ondansetron (ZOFRAN-ODT) 4 MG disintegrating tablet Take 1 tablet (4 mg total) by mouth every 8 (eight) hours as needed for nausea or vomiting. 04/11/23  Yes Brookley Spitler K, PA-C  sucralfate (CARAFATE) 1 g tablet Take 1 tablet (1 g total) by mouth 4 (four) times daily -  with meals and at bedtime. 04/11/23  Yes Tesha Archambeau K, PA-C  traMADol (ULTRAM) 50 MG tablet Take 1 tablet (50 mg total) by mouth every 8  (eight) hours as needed for moderate pain or severe pain. 04/09/23  Yes Ellin Goodie E, NP  triamcinolone cream (KENALOG) 0.5 % Apply 1 Application topically 3 (three) times daily. 10/01/22  Yes Arnette Felts, FNP  albuterol (VENTOLIN HFA) 108 (90 Base) MCG/ACT inhaler Inhale 2 puffs into the lungs every 6 (six) hours as needed for wheezing or shortness of breath. 03/13/22   Arnette Felts, FNP  ferrous sulfate 325 (65 FE) MG EC tablet Take 1 tablet (325 mg total) by mouth 2 (two) times daily. 12/18/22 12/18/23  Arnette Felts, FNP  methocarbamol (ROBAXIN-750) 750 MG tablet Take 1 tablet (750 mg total) by mouth 2 (two) times daily as needed for muscle spasms. 02/05/23   London Sheer, MD  traZODone (DESYREL) 50 MG tablet TAKE 1 TABLET BY MOUTH AT BEDTIME AS NEEDED FOR SLEEP. 03/02/23   Arnette Felts, FNP  cetirizine (ZYRTEC) 10 MG tablet Take 10 mg by mouth daily.  10/13/19  [provider]    Family History Family History  Problem Relation Age of Onset   Diabetes Mother    Liver disease Mother    Hypertension Mother     Social History Social History   Tobacco Use   Smoking status: Former   Smokeless tobacco: Never  Building services engineer Use: Never used  Substance Use Topics   Alcohol use: No   Drug use: No     Allergies   Latex   Review of Systems Review of Systems  Constitutional:  Positive for activity change, chills and fatigue. Negative for appetite change and fever.  Respiratory:  Negative for cough and shortness of breath.   Cardiovascular:  Negative for chest pain.  Gastrointestinal:  Positive for abdominal pain, diarrhea, nausea and vomiting. Negative for blood in stool.  Genitourinary:  Negative for dysuria, frequency and urgency.  Neurological:  Positive for weakness (gerneralized).     Physical Exam Triage Vital Signs ED Triage Vitals  Enc Vitals Group     BP 04/11/23 1020 102/65     Pulse Rate 04/11/23 1020 85     Resp 04/11/23 1020 16     Temp  04/11/23 1020 99.5 F (37.5 C)     Temp Source 04/11/23 1020 Oral     SpO2 04/11/23 1020 94 %     Weight 04/11/23 1019 226 lb (102.5 kg)     Height 04/11/23 1019 4\' 9"  (1.448 m)     Head Circumference --      Peak Flow --      Pain Score 04/11/23 1019 0     Pain Loc --      Pain Edu? --  Excl. in GC? --    No data found.  Updated Vital Signs BP 102/65 (BP Location: Right Arm)   Pulse 85   Temp 99.5 F (37.5 C) (Oral)   Resp 16   Ht 4\' 9"  (1.448 m)   Wt 226 lb (102.5 kg)   LMP 04/04/2023 (Approximate)   SpO2 94%   BMI 48.91 kg/m   Visual Acuity Right Eye Distance:   Left Eye Distance:   Bilateral Distance:    Right Eye Near:   Left Eye Near:    Bilateral Near:     Physical Exam Vitals reviewed.  Constitutional:      General: She is awake. She is not in acute distress.    Appearance: Normal appearance. She is well-developed. She is not ill-appearing.     Comments: Pleasant female appears stated age in no acute distress sitting comfortably in exam room  HENT:     Head: Normocephalic and atraumatic.  Cardiovascular:     Rate and Rhythm: Normal rate and regular rhythm.     Heart sounds: Normal heart sounds, S1 normal and S2 normal. No murmur heard. Pulmonary:     Effort: Pulmonary effort is normal.     Breath sounds: Normal breath sounds. No wheezing, rhonchi or rales.     Comments: Clear to auscultation bilaterally Abdominal:     General: Bowel sounds are normal.     Palpations: Abdomen is soft.     Tenderness: There is generalized abdominal tenderness and tenderness in the epigastric area. There is no right CVA tenderness, left CVA tenderness, guarding or rebound. Negative signs include Murphy's sign.     Comments: Mild tenderness palpation throughout abdomen but worse in epigastrium.  No evidence of acute abdomen on physical exam.  Psychiatric:        Behavior: Behavior is cooperative.      UC Treatments / Results  Labs (all labs ordered are listed,  but only abnormal results are displayed) Labs Reviewed  POCT URINALYSIS DIP (MANUAL ENTRY) - Abnormal; Notable for the following components:      Result Value   Blood, UA large (*)    All other components within normal limits  CBC WITH DIFFERENTIAL/PLATELET  COMPREHENSIVE METABOLIC PANEL  LIPASE, BLOOD  POCT URINE PREGNANCY    EKG   Radiology No results found.  Procedures Procedures (including critical care time)  Medications Ordered in UC Medications  ondansetron (ZOFRAN-ODT) disintegrating tablet 4 mg (4 mg Oral Given 04/11/23 1100)  alum & mag hydroxide-simeth (MAALOX/MYLANTA) 200-200-20 MG/5ML suspension 30 mL (30 mLs Oral Given 04/11/23 1129)    Initial Impression / Assessment and Plan / UC Course  I have reviewed the triage vital signs and the nursing notes.  Pertinent labs & imaging results that were available during my care of the patient were reviewed by me and considered in my medical decision making (see chart for details).     Patient is well-appearing, afebrile, nontoxic, nontachycardic.  Vital signs and physical exam are reassuring today.  UA showed blood but this has been present for approximately 4 years.  Urine pregnancy was negative.  Patient was given Zofran which did help resolve her nausea symptoms.  This did trigger some GERD and so she was given Maalox which she spit up.  She denies any significant vomiting.  We did discuss that given concern that she failed oral challenge would be reasonable to go to the emergency room but she declined this.  She was able to eat and drink  after this episode without recurrent vomiting and with improvement of burning sensation in her chest.  She was started on Carafate to help with GERD symptoms.  Zofran was sent to the pharmacy with instruction to use this as needed for nausea and vomiting.  She is to eat a bland diet and avoid NSAIDs and alcohol.  We discussed that if she has any changing or worsening symptoms including severe  abdominal pain, nausea and vomiting despite antiemetic medication, changes in bowel habits, melena, hematochezia, hematemesis, weakness she needs to go to the emergency room.  Strict return precautions given.  Work excuse note provided.  Final Clinical Impressions(s) / UC Diagnoses   Final diagnoses:  Gastroenteritis  Nausea vomiting and diarrhea  Gastroesophageal reflux disease with esophagitis without hemorrhage     Discharge Instructions      I am glad that you are able to eat and drink after taking that medication.  Use Zofran every 8 hours as needed for nausea and vomiting.  Take Carafate before each meal and before bed to help coat your stomach.  Eat a bland diet and avoid spicy/acidic/fatty foods.  Avoid NSAIDs (aspirin, ibuprofen/Advil, naproxen/Aleve) and alcohol.  If your symptoms or not improving within a few days or if you have any worsening symptoms including severe abdominal pain, nausea/vomiting despite medication, blood in your stool, blood in your vomit, weakness you need to go to the emergency room.     ED Prescriptions     Medication Sig Dispense Auth. Provider   ondansetron (ZOFRAN-ODT) 4 MG disintegrating tablet Take 1 tablet (4 mg total) by mouth every 8 (eight) hours as needed for nausea or vomiting. 20 tablet Clella Mckeel K, PA-C   sucralfate (CARAFATE) 1 g tablet Take 1 tablet (1 g total) by mouth 4 (four) times daily -  with meals and at bedtime. 28 tablet Emilian Stawicki, Noberto Retort, PA-C      PDMP not reviewed this encounter.   Jeani Hawking, PA-C 04/11/23 1231

## 2023-04-11 NOTE — Discharge Instructions (Signed)
I am glad that you are able to eat and drink after taking that medication.  Use Zofran every 8 hours as needed for nausea and vomiting.  Take Carafate before each meal and before bed to help coat your stomach.  Eat a bland diet and avoid spicy/acidic/fatty foods.  Avoid NSAIDs (aspirin, ibuprofen/Advil, naproxen/Aleve) and alcohol.  If your symptoms or not improving within a few days or if you have any worsening symptoms including severe abdominal pain, nausea/vomiting despite medication, blood in your stool, blood in your vomit, weakness you need to go to the emergency room.

## 2023-04-12 ENCOUNTER — Telehealth (HOSPITAL_COMMUNITY): Payer: Self-pay | Admitting: Physician Assistant

## 2023-04-12 MED ORDER — POTASSIUM CHLORIDE ER 10 MEQ PO TBCR: 10.0000 meq | EXTENDED_RELEASE_TABLET | Freq: Every day | ORAL | 0 refills | Status: DC

## 2023-04-12 NOTE — Telephone Encounter (Signed)
Patient had hypokalemia with potassium of 3.1 on lab work obtained during her visit with urgent care.  Potassium supplement sent to pharmacy.  See result note for additional information.

## 2023-04-20 ENCOUNTER — Ambulatory Visit: Payer: 59 | Admitting: Nurse Practitioner

## 2023-04-26 ENCOUNTER — Encounter: Payer: Self-pay | Admitting: Physical Medicine and Rehabilitation

## 2023-04-27 ENCOUNTER — Other Ambulatory Visit: Payer: Self-pay | Admitting: Physical Medicine and Rehabilitation

## 2023-04-27 ENCOUNTER — Telehealth: Payer: Self-pay | Admitting: Physical Medicine and Rehabilitation

## 2023-04-27 DIAGNOSIS — R32 Unspecified urinary incontinence: Secondary | ICD-10-CM | POA: Diagnosis not present

## 2023-04-27 DIAGNOSIS — Z791 Long term (current) use of non-steroidal anti-inflammatories (NSAID): Secondary | ICD-10-CM | POA: Diagnosis not present

## 2023-04-27 DIAGNOSIS — Z841 Family history of disorders of kidney and ureter: Secondary | ICD-10-CM | POA: Diagnosis not present

## 2023-04-27 DIAGNOSIS — Z833 Family history of diabetes mellitus: Secondary | ICD-10-CM | POA: Diagnosis not present

## 2023-04-27 DIAGNOSIS — G4700 Insomnia, unspecified: Secondary | ICD-10-CM | POA: Diagnosis not present

## 2023-04-27 DIAGNOSIS — M1990 Unspecified osteoarthritis, unspecified site: Secondary | ICD-10-CM | POA: Diagnosis not present

## 2023-04-27 DIAGNOSIS — N189 Chronic kidney disease, unspecified: Secondary | ICD-10-CM | POA: Diagnosis not present

## 2023-04-27 DIAGNOSIS — Z87891 Personal history of nicotine dependence: Secondary | ICD-10-CM | POA: Diagnosis not present

## 2023-04-27 DIAGNOSIS — Z8249 Family history of ischemic heart disease and other diseases of the circulatory system: Secondary | ICD-10-CM | POA: Diagnosis not present

## 2023-04-27 MED ORDER — TRAMADOL HCL 50 MG PO TABS: 50.0000 mg | ORAL_TABLET | Freq: Four times a day (QID) | ORAL | 0 refills | Status: DC | PRN

## 2023-04-27 NOTE — Telephone Encounter (Signed)
Patient called asked for a call back concerning an appointment with Dr. Alvester Morin for her back. The number to contact patient is [phone]

## 2023-04-27 NOTE — Telephone Encounter (Signed)
Spoke with patient this morning via telephone, she reports greater than 80% relief of pain with bilateral L5-S1 medial branch blocks on 03/31/2023. We will proceed with second set of medial branch blocks, if good relief of pain with diagnostic medial branch blocks plan is to move toward radiofrequency ablation procedure.

## 2023-04-27 NOTE — Telephone Encounter (Addendum)
Spoke with patient and informed her that her injection was approved then denied. She stated she hadn't paid for her insurance, but it is paid now. Spoke with Charna Archer, CMA and she stated she needs documentation stating results of  the first MBB. Please advise

## 2023-05-05 ENCOUNTER — Ambulatory Visit (INDEPENDENT_AMBULATORY_CARE_PROVIDER_SITE_OTHER): Payer: 59 | Admitting: Physical Medicine and Rehabilitation

## 2023-05-05 ENCOUNTER — Other Ambulatory Visit: Payer: Self-pay

## 2023-05-05 VITALS — BP 127/83 | HR 73

## 2023-05-05 DIAGNOSIS — M47816 Spondylosis without myelopathy or radiculopathy, lumbar region: Secondary | ICD-10-CM | POA: Diagnosis not present

## 2023-05-05 MED ORDER — BUPIVACAINE HCL 0.25 % IJ SOLN
2.0000 mL | Freq: Once | INTRAMUSCULAR | Status: AC
Start: 2023-05-05 — End: 2023-05-05
  Administered 2023-05-05: 2 mL

## 2023-05-05 NOTE — Patient Instructions (Signed)

## 2023-05-05 NOTE — Progress Notes (Signed)
Functional Pain Scale - descriptive words and definitions  Distressing (6)    Pain is present/unable to complete most ADLs limited by pain/sleep is difficult and active distraction is only marginal. Moderate range order  Average Pain 8   +Driver, -BT, -Dye Allergies.  Lower back pain on both sides with radiation in the legs

## 2023-05-06 ENCOUNTER — Encounter: Payer: Self-pay | Admitting: Physical Medicine and Rehabilitation

## 2023-05-08 NOTE — Progress Notes (Signed)
Deborah Jacobson - 42 y.o. female MRN [id number]  Date of birth: [birth year]  Office Visit Note: Visit Date: 05/05/2023 PCP: Arnette Felts, FNP Referred by: Arnette Felts, FNP  Subjective: Chief Complaint  Patient presents with   Lower Back - Pain   HPI:  Deborah Jacobson is a 42 y.o. female who comes in today for planned repeat Bilateral L5-S1 Lumbar facet/medial branch block with fluoroscopic guidance.  The patient has failed conservative care including home exercise, medications, time and activity modification.  This injection will be diagnostic and hopefully therapeutic.  Please see requesting physician notes for further details and justification.  Exam shows concordant low back pain with facet joint loading and extension. Patient received more than 80% pain relief from prior injection. This would be the second block in a diagnostic double block paradigm.     Referring:Megan Mayford Knife, FNP   ROS Otherwise per HPI.  Assessment & Plan: Visit Diagnoses:    ICD-10-CM   1. Spondylosis without myelopathy or radiculopathy, lumbar region  M47.816 XR C-ARM NO REPORT    Facet Injection    bupivacaine (MARCAINE) 0.25 % (with pres) injection 2 mL      Plan: No additional findings.   Meds & Orders:  Meds ordered this encounter  Medications   bupivacaine (MARCAINE) 0.25 % (with pres) injection 2 mL    Orders Placed This Encounter  Procedures   Facet Injection   XR C-ARM NO REPORT    Follow-up: Return for Review Pain Diary.   Procedures: No procedures performed  Lumbar Diagnostic Facet Joint Nerve Block with Fluoroscopic Guidance   Patient: Deborah Jacobson      Date of Birth: [birth year] MRN: [id number] PCP: Arnette Felts, FNP      Visit Date: 05/05/2023   Universal Protocol:    Date/Time: 06/28/245:28 AM  Consent Given By: the patient  Position: PRONE  Additional Comments: Vital signs were monitored before and after the procedure. Patient was prepped and draped in  the usual sterile fashion. The correct patient, procedure, and site was verified.   Injection Procedure Details:   Procedure diagnoses:  1. Spondylosis without myelopathy or radiculopathy, lumbar region      Meds Administered:  Meds ordered this encounter  Medications   bupivacaine (MARCAINE) 0.25 % (with pres) injection 2 mL     Laterality: Bilateral  Location/Site: L5-S1, L4 medial branch and L5 dorsal ramus  Needle: 5.0 in., 25 ga.  Short bevel or Quincke spinal needle  Needle Placement: Oblique pedical  Findings:   -Comments: There was excellent flow of contrast along the articular pillars without intravascular flow.  Procedure Details: The fluoroscope beam is vertically oriented in AP and then obliqued 15 to 20 degrees to the ipsilateral side of the desired nerve to achieve the "Scotty dog" appearance.  The skin over the target area of the junction of the superior articulating process and the transverse process (sacral ala if blocking the L5 dorsal rami) was locally anesthetized with a 1 ml volume of 1% Lidocaine without Epinephrine.  The spinal needle was inserted and advanced in a trajectory view down to the target.   After contact with periosteum and negative aspirate for blood and CSF, correct placement without intravascular or epidural spread was confirmed by injecting 0.5 ml. of Isovue-250.  A spot radiograph was obtained of this image.    Next, a 0.5 ml. volume of the injectate described above was injected. The needle was then redirected to the other facet joint nerves  mentioned above if needed.  Prior to the procedure, the patient was given a Pain Diary which was completed for baseline measurements.  After the procedure, the patient rated their pain every 30 minutes and will continue rating at this frequency for a total of 5 hours.  The patient has been asked to complete the Diary and return to [place] by mail, fax or hand delivered as soon as possible.   Additional  Comments:  No complications occurred Dressing: 2 x 2 sterile gauze and Band-Aid    Post-procedure details: Patient was observed during the procedure. Post-procedure instructions were reviewed.  Patient left the clinic in stable condition.   Clinical History: MRI LUMBAR SPINE WITHOUT CONTRAST   TECHNIQUE: Multiplanar, multisequence MR imaging of the lumbar spine was performed. No intravenous contrast was administered.   COMPARISON:  Lumbar radiographs 01/20/2023.   FINDINGS: Segmentation:  Normal on the comparison.   Alignment: Maintained lumbar lordosis. No significant scoliosis or spondylolisthesis.   Vertebrae: No marrow edema or evidence of acute osseous abnormality. Visualized bone marrow signal is within normal limits. Intact visible sacrum and SI joints.   Conus medullaris and cauda equina: Conus extends to the L1 level. No lower spinal cord or conus signal abnormality. Unremarkable cauda equina nerve roots. Lumbar epidural space appears normal.   Paraspinal and other soft tissues: Visualized abdominal viscera and paraspinal soft tissues are within normal limits. Partially visible simple appearing 4.2 cm right pelvic cyst, probably ovarian in origin (series 4, image 1). No follow-up imaging is recommended. Reference: JACR 2020 Feb;17(2):248-254   Otherwise negative visible pelvic viscera.   Disc levels:   Visible lower thoracic levels through T12-L1 appear negative.   L1-L2:  Negative.   L2-L3:  Negative disc.  Up to mild facet hypertrophy.  No stenosis.   L3-L4: Subtle disc desiccation. No disc bulging. No facet hypertrophy. No stenosis.   L4-L5: Subtle disc desiccation. No disc bulging. Mild facet and ligament flavum hypertrophy. No stenosis.   L5-S1: Negative disc. Mild to moderate facet hypertrophy greater on the left. Mild epidural lipomatosis. No significant stenosis.   IMPRESSION: No osseous abnormality. Only subtle lumbar disc  degeneration. Intermittent Lumbar facet hypertrophy which is mild, except moderate on the left at L5-S1. No lumbar spinal stenosis or convincing neural impingement.     Electronically Signed   By: Odessa Fleming M.D.   On: 03/02/2023 07:23     Objective:  VS:  HT:    WT:   BMI:     BP:127/83  HR:73bpm  TEMP: ( )  RESP:  Physical Exam Vitals and nursing note reviewed.  Constitutional:      General: She is not in acute distress.    Appearance: Normal appearance. She is obese. She is not ill-appearing.  HENT:     Head: Normocephalic and atraumatic.     Right Ear: External ear normal.     Left Ear: External ear normal.  Eyes:     Extraocular Movements: Extraocular movements intact.  Cardiovascular:     Rate and Rhythm: Normal rate.     Pulses: Normal pulses.  Pulmonary:     Effort: Pulmonary effort is normal. No respiratory distress.  Abdominal:     General: There is no distension.     Palpations: Abdomen is soft.  Musculoskeletal:        General: Tenderness present.     Cervical back: Neck supple.     Right lower leg: No edema.     Left lower leg:  No edema.     Comments: Patient has good distal strength with no pain over the greater trochanters.  No clonus or focal weakness.  Skin:    Findings: No erythema, lesion or rash.  Neurological:     General: No focal deficit present.     Mental Status: She is alert and oriented to person, place, and time.     Sensory: No sensory deficit.     Motor: No weakness or abnormal muscle tone.     Coordination: Coordination normal.  Psychiatric:        Mood and Affect: Mood normal.        Behavior: Behavior normal.      Imaging: No results found.

## 2023-05-08 NOTE — Procedures (Signed)
Lumbar Diagnostic Facet Joint Nerve Block with Fluoroscopic Guidance   Patient: Deborah Jacobson      Date of Birth: 09/08/1981 MRN: [id number] PCP: Arnette Felts, FNP      Visit Date: 05/05/2023   Universal Protocol:    Date/Time: 06/28/245:28 AM  Consent Given By: the patient  Position: PRONE  Additional Comments: Vital signs were monitored before and after the procedure. Patient was prepped and draped in the usual sterile fashion. The correct patient, procedure, and site was verified.   Injection Procedure Details:   Procedure diagnoses:  1. Spondylosis without myelopathy or radiculopathy, lumbar region      Meds Administered:  Meds ordered this encounter  Medications   bupivacaine (MARCAINE) 0.25 % (with pres) injection 2 mL     Laterality: Bilateral  Location/Site: L5-S1, L4 medial branch and L5 dorsal ramus  Needle: 5.0 in., 25 ga.  Short bevel or Quincke spinal needle  Needle Placement: Oblique pedical  Findings:   -Comments: There was excellent flow of contrast along the articular pillars without intravascular flow.  Procedure Details: The fluoroscope beam is vertically oriented in AP and then obliqued 15 to 20 degrees to the ipsilateral side of the desired nerve to achieve the "Scotty dog" appearance.  The skin over the target area of the junction of the superior articulating process and the transverse process (sacral ala if blocking the L5 dorsal rami) was locally anesthetized with a 1 ml volume of 1% Lidocaine without Epinephrine.  The spinal needle was inserted and advanced in a trajectory view down to the target.   After contact with periosteum and negative aspirate for blood and CSF, correct placement without intravascular or epidural spread was confirmed by injecting 0.5 ml. of Isovue-250.  A spot radiograph was obtained of this image.    Next, a 0.5 ml. volume of the injectate described above was injected. The needle was then redirected to the other  facet joint nerves mentioned above if needed.  Prior to the procedure, the patient was given a Pain Diary which was completed for baseline measurements.  After the procedure, the patient rated their pain every 30 minutes and will continue rating at this frequency for a total of 5 hours.  The patient has been asked to complete the Diary and return to [place] by mail, fax or hand delivered as soon as possible.   Additional Comments:  No complications occurred Dressing: 2 x 2 sterile gauze and Band-Aid    Post-procedure details: Patient was observed during the procedure. Post-procedure instructions were reviewed.  Patient left the clinic in stable condition.

## 2023-05-11 ENCOUNTER — Other Ambulatory Visit: Payer: Self-pay | Admitting: Physical Medicine and Rehabilitation

## 2023-05-11 DIAGNOSIS — M47816 Spondylosis without myelopathy or radiculopathy, lumbar region: Secondary | ICD-10-CM

## 2023-05-11 DIAGNOSIS — G8929 Other chronic pain: Secondary | ICD-10-CM

## 2023-05-11 MED ORDER — DIAZEPAM 5 MG PO TABS: ORAL_TABLET | ORAL | 0 refills | Status: DC

## 2023-05-18 NOTE — Telephone Encounter (Signed)
Yea, let her know any muscle relaxers can cause this.  I also think that it is a good idea to see pcp

## 2023-05-18 NOTE — Telephone Encounter (Signed)
Tramadol can also cause this

## 2023-05-20 ENCOUNTER — Ambulatory Visit (INDEPENDENT_AMBULATORY_CARE_PROVIDER_SITE_OTHER): Payer: 59 | Admitting: Nurse Practitioner

## 2023-05-20 ENCOUNTER — Encounter: Payer: Self-pay | Admitting: Nurse Practitioner

## 2023-05-20 VITALS — BP 130/76 | HR 85 | Temp 98.4°F | Ht <= 58 in | Wt 218.2 lb

## 2023-05-20 DIAGNOSIS — I10 Essential (primary) hypertension: Secondary | ICD-10-CM | POA: Diagnosis not present

## 2023-05-20 DIAGNOSIS — R5383 Other fatigue: Secondary | ICD-10-CM | POA: Diagnosis not present

## 2023-05-20 DIAGNOSIS — R7303 Prediabetes: Secondary | ICD-10-CM

## 2023-05-20 DIAGNOSIS — Z6841 Body Mass Index (BMI) 40.0 and over, adult: Secondary | ICD-10-CM | POA: Diagnosis not present

## 2023-05-20 DIAGNOSIS — R002 Palpitations: Secondary | ICD-10-CM | POA: Diagnosis not present

## 2023-05-20 DIAGNOSIS — D508 Other iron deficiency anemias: Secondary | ICD-10-CM | POA: Diagnosis not present

## 2023-05-20 NOTE — Assessment & Plan Note (Signed)
She is encouraged to strive for BMI less than 30 to decrease cardiac risk. Advised to aim for at least 150 minutes of exercise per week.  

## 2023-05-20 NOTE — Assessment & Plan Note (Addendum)
EKG done normal sinus rhythm heart rate 73, advised to limit intake of caffeine and stay hydrated

## 2023-05-20 NOTE — Assessment & Plan Note (Addendum)
Chronic, last HgbA1c was 6.2 she is encouraged to limit intake of sugary foods and drinks.  Marland Kitchenlasth  Lab Results  Component Value Date   HGBA1C 6.2 (H) 12/17/2022

## 2023-05-20 NOTE — Progress Notes (Signed)
Madelaine Bhat, CMA,acting as a Neurosurgeon for Arnette Felts, FNP.,have documented all relevant documentation on the behalf of Arnette Felts, FNP,as directed by  Arnette Felts, FNP while in the presence of Arnette Felts, FNP.  Subjective:  Patient ID: Deborah Jacobson , female    DOB: 1981/02/01 , 42 y.o.   MRN: [id number]  Chief Complaint  Patient presents with   Fatigue    HPI  Patient presents today for a bp and pre DM follow up. Patient reports compliance with medications. Patient reports she has been fatigue for almost 3 weeks now. Patient denies any chest pain, SOB, or headaches. Patient reports she hasn't been taking her iron pills because she never received them. She has not been taking her over the counter as regularly mostly in the last 2 weeks. Continues to get menstrual cycles for 3 days and have been heavy. She is seen by Milas Hock for her GYN care.   BP Readings from Last 3 Encounters: 05/20/23 : 130/76 05/05/23 : 127/83 04/11/23 : 102/65     Past Medical History:  Diagnosis Date   Acid reflux    Diabetes in pregnancy    Hypertension    Seasonal allergies      Family History  Problem Relation Age of Onset   Diabetes Mother    Liver disease Mother    Hypertension Mother      Current Outpatient Medications:    albuterol (VENTOLIN HFA) 108 (90 Base) MCG/ACT inhaler, Inhale 2 puffs into the lungs every 6 (six) hours as needed for wheezing or shortness of breath., Disp: 6.7 g, Rfl: 1   cyclobenzaprine (FLEXERIL) 5 MG tablet, Take 1 tablet (5 mg total) by mouth 2 (two) times daily as needed for muscle spasms., Disp: 20 tablet, Rfl: 0   diazepam (VALIUM) 5 MG tablet, Take one tablet by mouth with food one hour prior to procedure. May repeat 30 minutes prior if needed., Disp: 2 tablet, Rfl: 0   hydrochlorothiazide (HYDRODIURIL) 12.5 MG tablet, TAKE 1 TABLET (12.5 MG) BY MOUTH DAILY, Disp: 30 tablet, Rfl: 5   meloxicam (MOBIC) 15 MG tablet, TAKE 1/2 TO 1 TABLET BY MOUTH  DAILY AS NEEDED FOR PAIN, Disp: 30 tablet, Rfl: 1   methocarbamol (ROBAXIN-750) 750 MG tablet, Take 1 tablet (750 mg total) by mouth 2 (two) times daily as needed for muscle spasms., Disp: 40 tablet, Rfl: 0   ondansetron (ZOFRAN-ODT) 4 MG disintegrating tablet, Take 1 tablet (4 mg total) by mouth every 8 (eight) hours as needed for nausea or vomiting., Disp: 20 tablet, Rfl: 0   potassium chloride (KLOR-CON) 10 MEQ tablet, Take 1 tablet (10 mEq total) by mouth daily., Disp: 7 tablet, Rfl: 0   sucralfate (CARAFATE) 1 g tablet, Take 1 tablet (1 g total) by mouth 4 (four) times daily -  with meals and at bedtime., Disp: 28 tablet, Rfl: 0   traMADol (ULTRAM) 50 MG tablet, Take 1 tablet (50 mg total) by mouth every 8 (eight) hours as needed for moderate pain or severe pain., Disp: 20 tablet, Rfl: 0   traMADol (ULTRAM) 50 MG tablet, Take 1 tablet (50 mg total) by mouth every 6 (six) hours as needed., Disp: 20 tablet, Rfl: 0   traZODone (DESYREL) 50 MG tablet, TAKE 1 TABLET BY MOUTH AT BEDTIME AS NEEDED FOR SLEEP., Disp: 30 tablet, Rfl: 2   triamcinolone cream (KENALOG) 0.5 %, Apply 1 Application topically 3 (three) times daily., Disp: 30 g, Rfl: 1   ferrous sulfate  325 (65 FE) MG EC tablet, Take 1 tablet (325 mg total) by mouth 2 (two) times daily. (Patient not taking: Reported on 05/20/2023), Disp: 60 tablet, Rfl: 3   Allergies  Allergen Reactions   Latex Hives    Powder from gloves     Review of Systems  Constitutional: Negative.   Respiratory: Negative.  Negative for shortness of breath and wheezing.   Cardiovascular:  Positive for palpitations. Negative for chest pain.  Neurological: Negative.   Psychiatric/Behavioral: Negative.       Today's Vitals   05/20/23 0850  BP: 130/76  Pulse: 85  Temp: 98.4 F (36.9 C)  TempSrc: Oral  Weight: 218 lb 3.2 oz (99 kg)  Height: 4\' 9"  (1.448 m)   Body mass index is 47.22 kg/m.  Wt Readings from Last 3 Encounters:  05/20/23 218 lb 3.2 oz (99 kg)   04/11/23 226 lb (102.5 kg)  12/29/22 222 lb 11.2 oz (101 kg)     Objective:  Physical Exam Vitals reviewed.  Constitutional:      General: She is not in acute distress.    Appearance: Normal appearance. She is well-developed. She is obese.  Cardiovascular:     Rate and Rhythm: Normal rate and regular rhythm.     Pulses: Normal pulses.     Heart sounds: Normal heart sounds. No murmur heard. Pulmonary:     Effort: Pulmonary effort is normal. No respiratory distress.     Breath sounds: Normal breath sounds. No wheezing.  Chest:     Chest wall: No tenderness.  Musculoskeletal:        General: No swelling or tenderness. Normal range of motion.  Skin:    General: Skin is warm and dry.     Capillary Refill: Capillary refill takes less than 2 seconds.  Neurological:     General: No focal deficit present.     Mental Status: She is alert and oriented to person, place, and time.     Cranial Nerves: No cranial nerve deficit.     Motor: No weakness.  Psychiatric:        Mood and Affect: Mood normal.        Behavior: Behavior normal.        Thought Content: Thought content normal.        Judgment: Judgment normal.         Assessment And Plan:  Other fatigue Assessment & Plan: She has been under increased stress and has not been consistent with taking her iron supplement.   Orders: -     Basic metabolic panel -     Vitamin B12 -     VITAMIN D 25 Hydroxy (Vit-D Deficiency, Fractures) -     Iron, TIBC and Ferritin Panel -     CBC with Differential/Platelet -     TSH  Palpitations Assessment & Plan: EKG done normal sinus rhythm heart rate 73, advised to limit intake of caffeine and stay hydrated  Orders: -     EKG 12-Lead  Iron deficiency anemia secondary to inadequate dietary iron intake  Essential hypertension Assessment & Plan: Blood pressure is fairly controlled, continue current medications  Orders: -     Basic metabolic panel  Prediabetes Assessment &  Plan: Chronic, last HgbA1c was 6.2 she is encouraged to limit intake of sugary foods and drinks.  Marland Kitchenlasth  Lab Results  Component Value Date   HGBA1C 6.2 (H) 12/17/2022      Morbid obesity with BMI of 45.0-49.9, adult (  HCC) Assessment & Plan: She is encouraged to strive for BMI less than 30 to decrease cardiac risk. Advised to aim for at least 150 minutes of exercise per week.     Return for 6 month bp check.   Patient was given opportunity to ask questions. Patient verbalized understanding of the plan and was able to repeat key elements of the plan. All questions were answered to their satisfaction.    Jeanell Sparrow, FNP, have reviewed all documentation for this visit. The documentation on 05/20/23 for the exam, diagnosis, procedures, and orders are all accurate and complete.   IF YOU HAVE BEEN REFERRED TO A SPECIALIST, IT MAY TAKE 1-2 WEEKS TO SCHEDULE/PROCESS THE REFERRAL. IF YOU HAVE NOT HEARD FROM US/SPECIALIST IN TWO WEEKS, PLEASE GIVE [place] A CALL AT [phone].

## 2023-05-20 NOTE — Assessment & Plan Note (Signed)
She has been under increased stress and has not been consistent with taking her iron supplement.

## 2023-05-20 NOTE — Assessment & Plan Note (Signed)
Blood pressure is fairly controlled, continue current medications.  

## 2023-05-21 LAB — CBC WITH DIFFERENTIAL/PLATELET
Basophils Absolute: 0 10*3/uL (ref 0.0–0.2)
Basos: 1 %
EOS (ABSOLUTE): 0.2 10*3/uL (ref 0.0–0.4)
Eos: 3 %
Hematocrit: 36 % (ref 34.0–46.6)
Hemoglobin: 11.5 g/dL (ref 11.1–15.9)
Immature Grans (Abs): 0 10*3/uL (ref 0.0–0.1)
Immature Granulocytes: 0 %
Lymphocytes Absolute: 2.2 10*3/uL (ref 0.7–3.1)
Lymphs: 38 %
MCH: 26.9 pg (ref 26.6–33.0)
MCHC: 31.9 g/dL (ref 31.5–35.7)
MCV: 84 fL (ref 79–97)
Monocytes Absolute: 0.4 10*3/uL (ref 0.1–0.9)
Monocytes: 7 %
Neutrophils Absolute: 2.9 10*3/uL (ref 1.4–7.0)
Neutrophils: 51 %
Platelets: 324 10*3/uL (ref 150–450)
RBC: 4.28 x10E6/uL (ref 3.77–5.28)
RDW: 15.7 % — ABNORMAL HIGH (ref 11.7–15.4)
WBC: 5.7 10*3/uL (ref 3.4–10.8)

## 2023-05-21 LAB — BASIC METABOLIC PANEL
BUN/Creatinine Ratio: 17 (ref 9–23)
BUN: 13 mg/dL (ref 6–24)
CO2: 23 mmol/L (ref 20–29)
Calcium: 9.2 mg/dL (ref 8.7–10.2)
Chloride: 102 mmol/L (ref 96–106)
Creatinine, Ser: 0.78 mg/dL (ref 0.57–1.00)
Glucose: 98 mg/dL (ref 70–99)
Potassium: 4.3 mmol/L (ref 3.5–5.2)
Sodium: 139 mmol/L (ref 134–144)
eGFR: 97 mL/min/{1.73_m2} (ref >59)

## 2023-05-21 LAB — IRON,TIBC AND FERRITIN PANEL
Ferritin: 20 ng/mL (ref 15–150)
Iron Saturation: 12 % — ABNORMAL LOW (ref 15–55)
Iron: 44 ug/dL (ref 27–159)
Total Iron Binding Capacity: 382 ug/dL (ref 250–450)
UIBC: 338 ug/dL (ref 131–425)

## 2023-05-21 LAB — TSH: TSH: 0.932 u[IU]/mL (ref 0.450–4.500)

## 2023-05-21 LAB — VITAMIN D 25 HYDROXY (VIT D DEFICIENCY, FRACTURES): Vit D, 25-Hydroxy: 35.3 ng/mL (ref 30.0–100.0)

## 2023-05-21 LAB — VITAMIN B12: Vitamin B-12: 555 pg/mL (ref 232–1245)

## 2023-05-27 ENCOUNTER — Other Ambulatory Visit: Payer: Self-pay | Admitting: Nurse Practitioner

## 2023-05-27 DIAGNOSIS — G4700 Insomnia, unspecified: Secondary | ICD-10-CM

## 2023-06-01 ENCOUNTER — Other Ambulatory Visit: Payer: Self-pay | Admitting: Physical Medicine and Rehabilitation

## 2023-06-01 ENCOUNTER — Encounter: Payer: Self-pay | Admitting: Physical Medicine and Rehabilitation

## 2023-06-01 ENCOUNTER — Encounter: Payer: Self-pay | Admitting: Nurse Practitioner

## 2023-06-01 DIAGNOSIS — G8929 Other chronic pain: Secondary | ICD-10-CM

## 2023-06-01 MED ORDER — TRAMADOL HCL 50 MG PO TABS: 50.0000 mg | ORAL_TABLET | Freq: Two times a day (BID) | ORAL | 0 refills | Status: DC | PRN

## 2023-06-10 ENCOUNTER — Encounter: Payer: Self-pay | Admitting: Physical Medicine and Rehabilitation

## 2023-06-10 ENCOUNTER — Other Ambulatory Visit: Payer: Self-pay

## 2023-06-10 ENCOUNTER — Ambulatory Visit (INDEPENDENT_AMBULATORY_CARE_PROVIDER_SITE_OTHER): Payer: 59 | Admitting: Physical Medicine and Rehabilitation

## 2023-06-10 VITALS — BP 129/91 | HR 81

## 2023-06-10 DIAGNOSIS — M47816 Spondylosis without myelopathy or radiculopathy, lumbar region: Secondary | ICD-10-CM

## 2023-06-10 MED ORDER — METHYLPREDNISOLONE ACETATE 80 MG/ML IJ SUSP
80.0000 mg | Freq: Once | INTRAMUSCULAR | Status: AC
  Administered 2023-06-10: 80 mg

## 2023-06-10 NOTE — Progress Notes (Signed)
Functional Pain Scale - descriptive words and definitions  Distressing (6)    Pain is present/unable to complete most ADLs limited by pain/sleep is difficult and active distraction is only marginal. Moderate range order  Average Pain 9   +Driver, -BT, -Dye Allergies.  Lower back pain on both sides

## 2023-06-10 NOTE — Patient Instructions (Signed)

## 2023-06-11 ENCOUNTER — Telehealth: Payer: Self-pay | Admitting: Physical Medicine and Rehabilitation

## 2023-06-11 NOTE — Telephone Encounter (Signed)
NA

## 2023-06-12 NOTE — Procedures (Signed)
Lumbar Facet Joint Nerve Denervation  Patient: Deborah Jacobson      Date of Birth: 04-07-1981 MRN: [id number] PCP: Arnette Felts, FNP      Visit Date: 06/10/2023   Universal Protocol:    Date/Time: 08/02/246:38 AM  Consent Given By: the patient  Position: PRONE  Additional Comments: Vital signs were monitored before and after the procedure. Patient was prepped and draped in the usual sterile fashion. The correct patient, procedure, and site was verified.   Injection Procedure Details:   Procedure diagnoses:  1. Spondylosis without myelopathy or radiculopathy, lumbar region      Meds Administered:  Meds ordered this encounter  Medications   methylPREDNISolone acetate (DEPO-MEDROL) injection 80 mg     Laterality: Bilateral  Location/Site:  L5-S1, L4 medial branch and L5 dorsal ramus  Needle: 18 ga.,  10mm active tip, RF Cannula  Needle Placement: Along juncture of superior articular process and transverse pocess  Findings:  -Comments: Unfortunately, particular at the L5 level, the patient just had an extraordinary pain response to any movement through the musculature.  Fluoroscopic imaging shows we are probably 3 or 4 inches away from any spinal contact and she really was having some difficulty.  This was despite the use of oral Valium preprocedure sedation.  We were able to continue to talk with her and use numbing medicine and complete the procedure with really good ultimate placement of all the cannulas.  The actual ablation procedure part went fine without any difficulty.  Her daughter did end up entering the room because she heard her mother essentially yelling with needle movement in the muscle.  Once the daughter came and I showed her what we are doing and explained everything and the patient did seem to settle down.  Otherwise there were no issues with the procedure itself.  Procedure Details: For each desired target nerve, the corresponding transverse process  (sacral ala for the L5 dorsal rami) was identified and the fluoroscope was positioned to square off the endplates of the corresponding vertebral body to achieve a true AP midline view.  The beam was then obliqued 15 to 20 degrees and caudally tilted 15 to 20 degrees to line up a trajectory along the target nerves. The skin over the target of the junction of superior articulating process and transverse process (sacral ala for the L5 dorsal rami) was infiltrated with 1ml of 1% Lidocaine without Epinephrine.  The 18 gauge 10mm active tip outer cannula was advanced in trajectory view to the target.  This procedure was repeated for each target nerve.  Then, for all levels, the outer cannula placement was fine-tuned and the position was then confirmed with bi-planar imaging.    Test stimulation was done both at sensory and motor levels to ensure there was no radicular stimulation. The target tissues were then infiltrated with 1 ml of 1% Lidocaine without Epinephrine. Subsequently, a percutaneous neurotomy was carried out for 90 seconds at 80 degrees Celsius.  After the completion of the lesion, 1 ml of injectate was delivered. It was then repeated for each facet joint nerve mentioned above. Appropriate radiographs were obtained to verify the probe placement during the neurotomy.   Additional Comments:   Dressing: 2 x 2 sterile gauze and Band-Aid    Post-procedure details: Patient was observed during the procedure. Post-procedure instructions were reviewed.  Patient left the clinic in stable condition.

## 2023-06-12 NOTE — Progress Notes (Signed)
[name] - 42 y.o. female MRN [id number]  Date of birth: 13-Sep-1981  Office Visit Note: Visit Date: 06/10/2023 PCP: Arnette Felts, FNP Referred by: Arnette Felts, FNP  Subjective: Chief Complaint  Patient presents with   Lower Back - Pain   HPI:  Deborah Jacobson is a 42 y.o. female who comes in todayfor planned radiofrequency ablation of the Bilateral L5-S1 Lumbar facet joints. This would be ablation of the corresponding medial branches and/or dorsal rami.  Patient has had double diagnostic blocks with more than 50% relief.  These are documented on pain diary.  They have had chronic back pain for quite some time, more than 3 months, which has been an ongoing situation with recalcitrant axial back pain.  They have no radicular pain.  Their axial pain is worse with standing and ambulating and on exam today with facet loading.  They have had physical therapy as well as home exercise program.  The imaging noted in the chart below indicated facet pathology. Accordingly they meet all the criteria and qualification for for radiofrequency ablation and we are going to complete this today hopefully for more longer term relief as part of comprehensive management program.   ROS Otherwise per HPI.  Assessment & Plan: Visit Diagnoses:    ICD-10-CM   1. Spondylosis without myelopathy or radiculopathy, lumbar region  M47.816 XR C-ARM NO REPORT    Radiofrequency,Lumbar    methylPREDNISolone acetate (DEPO-MEDROL) injection 80 mg      Plan: No additional findings.   Meds & Orders:  Meds ordered this encounter  Medications   methylPREDNISolone acetate (DEPO-MEDROL) injection 80 mg    Orders Placed This Encounter  Procedures   Radiofrequency,Lumbar   XR C-ARM NO REPORT    Follow-up: Return if symptoms worsen or fail to improve.   Procedures: No procedures performed  Lumbar Facet Joint Nerve Denervation  Patient: Deborah Jacobson      Date of Birth: May 13, 1981 MRN: [id number] PCP:  Arnette Felts, FNP      Visit Date: 06/10/2023   Universal Protocol:    Date/Time: 08/02/246:38 AM  Consent Given By: the patient  Position: PRONE  Additional Comments: Vital signs were monitored before and after the procedure. Patient was prepped and draped in the usual sterile fashion. The correct patient, procedure, and site was verified.   Injection Procedure Details:   Procedure diagnoses:  1. Spondylosis without myelopathy or radiculopathy, lumbar region      Meds Administered:  Meds ordered this encounter  Medications   methylPREDNISolone acetate (DEPO-MEDROL) injection 80 mg     Laterality: Bilateral  Location/Site:  L5-S1, L4 medial branch and L5 dorsal ramus  Needle: 18 ga.,  10mm active tip, RF Cannula  Needle Placement: Along juncture of superior articular process and transverse pocess  Findings:  -Comments: Unfortunately, particular at the L5 level, the patient just had an extraordinary pain response to any movement through the musculature.  Fluoroscopic imaging shows we are probably 3 or 4 inches away from any spinal contact and she really was having some difficulty.  This was despite the use of oral Valium preprocedure sedation.  We were able to continue to talk with her and use numbing medicine and complete the procedure with really good ultimate placement of all the cannulas.  The actual ablation procedure part went fine without any difficulty.  Her daughter did end up entering the room because she heard her mother essentially yelling with needle movement in the muscle.  Once  the daughter came and I showed her what we are doing and explained everything and the patient did seem to settle down.  Otherwise there were no issues with the procedure itself.  Procedure Details: For each desired target nerve, the corresponding transverse process (sacral ala for the L5 dorsal rami) was identified and the fluoroscope was positioned to square off the endplates of  the corresponding vertebral body to achieve a true AP midline view.  The beam was then obliqued 15 to 20 degrees and caudally tilted 15 to 20 degrees to line up a trajectory along the target nerves. The skin over the target of the junction of superior articulating process and transverse process (sacral ala for the L5 dorsal rami) was infiltrated with 1ml of 1% Lidocaine without Epinephrine.  The 18 gauge 10mm active tip outer cannula was advanced in trajectory view to the target.  This procedure was repeated for each target nerve.  Then, for all levels, the outer cannula placement was fine-tuned and the position was then confirmed with bi-planar imaging.    Test stimulation was done both at sensory and motor levels to ensure there was no radicular stimulation. The target tissues were then infiltrated with 1 ml of 1% Lidocaine without Epinephrine. Subsequently, a percutaneous neurotomy was carried out for 90 seconds at 80 degrees Celsius.  After the completion of the lesion, 1 ml of injectate was delivered. It was then repeated for each facet joint nerve mentioned above. Appropriate radiographs were obtained to verify the probe placement during the neurotomy.   Additional Comments:   Dressing: 2 x 2 sterile gauze and Band-Aid    Post-procedure details: Patient was observed during the procedure. Post-procedure instructions were reviewed.  Patient left the clinic in stable condition.      Clinical History: MRI LUMBAR SPINE WITHOUT CONTRAST   TECHNIQUE: Multiplanar, multisequence MR imaging of the lumbar spine was performed. No intravenous contrast was administered.   COMPARISON:  Lumbar radiographs 01/20/2023.   FINDINGS: Segmentation:  Normal on the comparison.   Alignment: Maintained lumbar lordosis. No significant scoliosis or spondylolisthesis.   Vertebrae: No marrow edema or evidence of acute osseous abnormality. Visualized bone marrow signal is within normal limits.  Intact visible sacrum and SI joints.   Conus medullaris and cauda equina: Conus extends to the L1 level. No lower spinal cord or conus signal abnormality. Unremarkable cauda equina nerve roots. Lumbar epidural space appears normal.   Paraspinal and other soft tissues: Visualized abdominal viscera and paraspinal soft tissues are within normal limits. Partially visible simple appearing 4.2 cm right pelvic cyst, probably ovarian in origin (series 4, image 1). No follow-up imaging is recommended. Reference: JACR 2020 Feb;17(2):248-254   Otherwise negative visible pelvic viscera.   Disc levels:   Visible lower thoracic levels through T12-L1 appear negative.   L1-L2:  Negative.   L2-L3:  Negative disc.  Up to mild facet hypertrophy.  No stenosis.   L3-L4: Subtle disc desiccation. No disc bulging. No facet hypertrophy. No stenosis.   L4-L5: Subtle disc desiccation. No disc bulging. Mild facet and ligament flavum hypertrophy. No stenosis.   L5-S1: Negative disc. Mild to moderate facet hypertrophy greater on the left. Mild epidural lipomatosis. No significant stenosis.   IMPRESSION: No osseous abnormality. Only subtle lumbar disc degeneration. Intermittent Lumbar facet hypertrophy which is mild, except moderate on the left at L5-S1. No lumbar spinal stenosis or convincing neural impingement.     Electronically Signed   By: Odessa Fleming M.D.   On:  03/02/2023 07:23     Objective:  VS:  HT:    WT:   BMI:     BP:(!) 129/91  HR:81bpm  TEMP: ( )  RESP:  Physical Exam Vitals and nursing note reviewed.  Constitutional:      General: She is not in acute distress.    Appearance: Normal appearance. She is obese. She is not ill-appearing.  HENT:     Head: Normocephalic and atraumatic.     Right Ear: External ear normal.     Left Ear: External ear normal.  Eyes:     Extraocular Movements: Extraocular movements intact.  Cardiovascular:     Rate and Rhythm: Normal rate.      Pulses: Normal pulses.  Pulmonary:     Effort: Pulmonary effort is normal. No respiratory distress.  Abdominal:     General: There is no distension.     Palpations: Abdomen is soft.  Musculoskeletal:        General: Tenderness present.     Cervical back: Neck supple.     Right lower leg: No edema.     Left lower leg: No edema.     Comments: Patient has good distal strength with no pain over the greater trochanters.  No clonus or focal weakness.  Skin:    Findings: No erythema, lesion or rash.  Neurological:     General: No focal deficit present.     Mental Status: She is alert and oriented to person, place, and time.     Sensory: No sensory deficit.     Motor: No weakness or abnormal muscle tone.     Coordination: Coordination normal.  Psychiatric:        Mood and Affect: Mood normal.        Behavior: Behavior normal.      Imaging: No results found.

## 2023-06-14 ENCOUNTER — Encounter: Payer: Self-pay | Admitting: Physical Medicine and Rehabilitation

## 2023-06-27 ENCOUNTER — Other Ambulatory Visit: Payer: Self-pay | Admitting: Podiatry

## 2023-06-27 DIAGNOSIS — Z1152 Encounter for screening for COVID-19: Secondary | ICD-10-CM | POA: Diagnosis not present

## 2023-06-27 DIAGNOSIS — R6889 Other general symptoms and signs: Secondary | ICD-10-CM | POA: Diagnosis not present

## 2023-07-02 ENCOUNTER — Encounter: Payer: Self-pay | Admitting: Physical Medicine and Rehabilitation

## 2023-07-02 ENCOUNTER — Other Ambulatory Visit: Payer: Self-pay | Admitting: Physical Medicine and Rehabilitation

## 2023-07-02 DIAGNOSIS — M7918 Myalgia, other site: Secondary | ICD-10-CM

## 2023-07-07 ENCOUNTER — Encounter: Payer: Self-pay | Admitting: Physical Medicine and Rehabilitation

## 2023-07-07 ENCOUNTER — Other Ambulatory Visit: Payer: Self-pay | Admitting: Physical Medicine and Rehabilitation

## 2023-07-20 ENCOUNTER — Ambulatory Visit: Payer: 59 | Admitting: Physical Therapy

## 2023-07-21 ENCOUNTER — Ambulatory Visit: Payer: 59 | Attending: Physical Medicine and Rehabilitation | Admitting: Physical Therapy

## 2023-07-21 ENCOUNTER — Encounter: Payer: Self-pay | Admitting: Physical Therapy

## 2023-07-21 ENCOUNTER — Other Ambulatory Visit: Payer: Self-pay

## 2023-07-21 DIAGNOSIS — R293 Abnormal posture: Secondary | ICD-10-CM | POA: Insufficient documentation

## 2023-07-21 DIAGNOSIS — M542 Cervicalgia: Secondary | ICD-10-CM | POA: Diagnosis not present

## 2023-07-21 DIAGNOSIS — M7918 Myalgia, other site: Secondary | ICD-10-CM | POA: Diagnosis not present

## 2023-07-21 DIAGNOSIS — M546 Pain in thoracic spine: Secondary | ICD-10-CM | POA: Insufficient documentation

## 2023-07-21 DIAGNOSIS — M6281 Muscle weakness (generalized): Secondary | ICD-10-CM | POA: Diagnosis not present

## 2023-07-21 NOTE — Therapy (Signed)
OUTPATIENT PHYSICAL THERAPY EVALUATION   Patient Name: Deborah Jacobson MRN: [id number] DOB:[birth year], 42 y.o., female Today's Date: 07/21/2023   END OF SESSION:  PT End of Session - 07/21/23 1023     Visit Number 1    Number of Visits 17    Date for PT Re-Evaluation 09/15/23    Authorization Type [employer] / [facility] MCD    PT Start Time [phone]    PT Stop Time 1015    PT Time Calculation (min) 36 min    Activity Tolerance Patient tolerated treatment well    Behavior During Therapy WFL for tasks assessed/performed             Past Medical History:  Diagnosis Date   Acid reflux    Diabetes in pregnancy    Hypertension    Seasonal allergies    Past Surgical History:  Procedure Laterality Date   CESAREAN SECTION     SHOULDER ARTHROSCOPY WITH BICEPS TENDON REPAIR Left 08/29/2020   Procedure: LEFT SHOULDER ARTHROSCOPY WITH BICEPS TENODESIS;  Surgeon: Tarry Kos, MD;  Location: [facility];  Service: Orthopedics;  Laterality: Left;   TUBAL LIGATION     Patient Active Problem List   Diagnosis Date Noted   Palpitations 05/20/2023   Iron deficiency anemia secondary to inadequate dietary iron intake 05/20/2023   Essential hypertension 12/17/2022   Insomnia 12/17/2022   Subacromial bursitis of left shoulder joint 08/29/2020   Tendinopathy of left rotator cuff 08/29/2020   Morbid obesity with BMI of 45.0-49.9, adult (HCC) 08/09/2020   Superior glenoid labrum lesion of left shoulder 07/10/2020   Health maintenance examination 03/23/2019   Prediabetes 12/17/2018   Strain of back 12-17-2018   Death of family member 2018-12-17   Back pain 07/31/2018   Other fatigue 05/31/2018   Chronic low back pain 11/30/2017   Shifting sleep-work schedule 06/15/2017   Paradoxical insomnia 06/15/2017   Sleep related headaches 06/15/2017   Super obese 06/15/2017   Mood complaints in sleep disorder 06/15/2017   Plantar fasciitis of left foot 03/01/2014   Porokeratosis 03/01/2014    Pain in lower limb 03/01/2014    PCP: Arnette Felts, FNP  REFERRING PROVIDER: Juanda Chance, NP  REFERRING DIAG: Myofascial pain syndrome  THERAPY DIAG:  Cervicalgia  Pain in thoracic spine  Muscle weakness (generalized)  Abnormal posture  Rationale for Evaluation and Treatment: Rehabilitation  ONSET DATE: Chronic   SUBJECTIVE:  SUBJECTIVE STATEMENT: Patient reports she is having pain in her neck, shoulders, and mid-upper back. She did have any operation on her left shoulder for a SLAP tear in 2021 that still gives her trouble. 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 She reports that it is an aching pain and can be over a 10 when it is hurting. She does try using modalities to help with her pain and it does not take the pain completely away. She denies any numbness or tingling in her hands. She does have history of lower back pain as well, and has had injections on an ablation on 06/10/2023 that has seemed to help her sciatica symptoms  PERTINENT HISTORY:  Chronic neck and low back pain, Left shoulder surgery 2021  PAIN:  Are you having pain? Yes:  NPRS scale: 4/10 (10/10 at worst) Pain location: Neck, mid-upper back, shoulders Pain description: Aching, throbbing Aggravating factors: Pulling, tugging, lifting clients Relieving factors: Ice packs or heating pads, creams, Tylenol  PRECAUTIONS: None  RED FLAGS: None    WEIGHT BEARING RESTRICTIONS: No  FALLS:  Has patient fallen in last 6 months? No  OCCUPATION: CNA   PLOF: Independent  PATIENT GOALS: Pain relief   OBJECTIVE:  PATIENT SURVEYS:  FOTO 52% functional status  COGNITION: Overall cognitive status: Within  functional limits for tasks assessed  SENSATION: Appears intact  POSTURE:   Rounded shoulder and forward head posture  PALPATION: Tender to palpation bilateral upper traps and periscapular musculature   CERVICAL ROM:   Active ROM A/PROM (deg) eval  Flexion 40  Extension 30  Right lateral flexion   Left lateral flexion   Right rotation 55  Left rotation 40   (Blank rows = not tested)  UPPER EXTREMITY ROM:   UE ROM grossly WFL, she did report left shoulder pain with all movements  UPPER EXTREMITY MMT:  MMT Right eval Left eval  Shoulder flexion 4 4  Shoulder extension 4+ 4  Shoulder abduction 4 4  Shoulder adduction    Shoulder internal rotation    Shoulder external rotation 4 5  Middle trapezius 4- 4-  Lower trapezius 3 3  Elbow flexion    Elbow extension    Wrist flexion    Wrist extension    Wrist ulnar deviation    Wrist radial deviation    Wrist pronation    Wrist supination    Grip strength     (Blank rows = not tested)  CERVICAL SPECIAL TESTS:  Radicular testing negative for cervical region  FUNCTIONAL TESTS:  Not assessed   TODAY'S TREATMENT:       [facility] Adult PT Treatment:                                                DATE: 07/21/2023 Therapeutic Exercise: Sidelying thoracic rotation x 3 each Row with green x 10 ER with yellow x 10 Instruction on SMFR using tennis ball for upper back and neck  PATIENT EDUCATION:  Education details: Exam findings, POC, HEP Person educated: Patient Education method: Explanation, Demonstration, Tactile cues, Verbal cues, and Handouts Education comprehension: verbalized understanding, returned demonstration, verbal cues required, tactile cues required, and needs further education  HOME EXERCISE PROGRAM: Access Code: [email or web address]    ASSESSMENT: CLINICAL IMPRESSION: Patient is a  42 y.o. female who was seen today for physical therapy evaluation and treatment for chronic neck and upper back pain. Evaluation  limited due to patient arriving late. Her pain seems primarily muscular at this time with limitations in cervical motion, postural deviations with poor postural strength, increased neck and periscapular tenderness and muscle tension.    OBJECTIVE IMPAIRMENTS: decreased activity tolerance, decreased ROM, decreased strength, impaired flexibility, improper body mechanics, postural dysfunction, and pain.   ACTIVITY LIMITATIONS: carrying, lifting, bending, sleeping, hygiene/grooming, and caring for others  PARTICIPATION LIMITATIONS: meal prep, cleaning, driving, shopping, community activity, and occupation  PERSONAL FACTORS: Fitness, Past/current experiences, Time since onset of injury/illness/exacerbation, and 1-2 comorbidities: see PMH above  are also affecting patient's functional outcome.   REHAB POTENTIAL: Good  CLINICAL DECISION MAKING: Evolving/moderate complexity  EVALUATION COMPLEXITY: Moderate   GOALS: Goals reviewed with patient? Yes  SHORT TERM GOALS: Target date: 08/18/2023  Patient will be I with initial HEP in order to progress with therapy. Baseline: HEP provided at eval Goal status: INITIAL  2.  Patient will report neck pain </= 8/10 with work or activity in order to reduce functional limitations Baseline: 10/10 pain Goal status: INITIAL  LONG TERM GOALS: Target date: 09/15/2023  Patient will be I with final HEP to maintain progress from PT. Baseline: HEP provided at eval Goal status: INITIAL  2.  Patient will report >/= 61% status on FOTO to indicate improved functional ability. Baseline: 52% functional status Goal status: INITIAL  3.  Patient will demonstrate cervical rotation >/= 60 deg bilaterally in order to reduce neck tension and improve driving ability Baseline: see limitations above Goal status: INITIAL  4.  Patient will demonstrate periscapular strength >/= 4/5 MMT in order to improve postural control and reduce pain with work related tasks such as  pulling  Baseline: see limitations above Goal status: INITIAL   PLAN: PT FREQUENCY: 1-2x/week  PT DURATION: 8 weeks  PLANNED INTERVENTIONS: Therapeutic exercises, Therapeutic activity, Neuromuscular re-education, Balance training, Gait training, Patient/Family education, Self Care, Joint mobilization, Joint manipulation, Aquatic Therapy, Dry Needling, Electrical stimulation, Spinal manipulation, Spinal mobilization, Cryotherapy, Moist heat, Taping, Ionotophoresis 4mg /ml Dexamethasone, Manual therapy, and Re-evaluation  PLAN FOR NEXT SESSION: Review HEP and progress PRN, manual/TPDN for upper traps and periscapular muscles, progress postural control, periscapular strengthening, left rotator cuff strengthening, cervical and thoracic mobility   Rosana Hoes, PT, DPT, LAT, ATC 07/21/23  3:54 PM Phone: [phone] Fax: [phone]

## 2023-07-21 NOTE — Patient Instructions (Signed)
Access Code: [email or web address] URL: [email or web address] Date: 07/21/2023 Prepared by: Rosana Hoes  Exercises - Sidelying Thoracic Lumbar Rotation  - 1 x daily - 10 reps - 5 seconds hold - Standing Row with Anchored Resistance  - 1 x daily - 3 sets - 10 reps - Shoulder External Rotation with Anchored Resistance  - 1 x daily - 3 sets - 10 reps - Standing Upper Trapezius Mobilization with Small Ball  - 3-5 minutes hold

## 2023-07-24 ENCOUNTER — Encounter: Payer: Self-pay | Admitting: Nurse Practitioner

## 2023-07-27 ENCOUNTER — Other Ambulatory Visit: Payer: Self-pay | Admitting: Nurse Practitioner

## 2023-07-27 ENCOUNTER — Encounter: Payer: Self-pay | Admitting: Physical Therapy

## 2023-07-27 ENCOUNTER — Other Ambulatory Visit: Payer: Self-pay

## 2023-07-27 MED ORDER — IBUPROFEN 800 MG PO TABS: 800.0000 mg | ORAL_TABLET | Freq: Three times a day (TID) | ORAL | 0 refills | Status: DC

## 2023-07-27 NOTE — Therapy (Deleted)
OUTPATIENT PHYSICAL THERAPY EVALUATION   Patient Name: Deborah Jacobson MRN: [id number] DOB:08-Jul-1981, 42 y.o., female Today's Date: 07/27/2023   END OF SESSION:    Past Medical History:  Diagnosis Date   Acid reflux    Diabetes in pregnancy    Hypertension    Seasonal allergies    Past Surgical History:  Procedure Laterality Date   CESAREAN SECTION     SHOULDER ARTHROSCOPY WITH BICEPS TENDON REPAIR Left 08/29/2020   Procedure: LEFT SHOULDER ARTHROSCOPY WITH BICEPS TENODESIS;  Surgeon: Tarry Kos, MD;  Location: [facility];  Service: Orthopedics;  Laterality: Left;   TUBAL LIGATION     Patient Active Problem List   Diagnosis Date Noted   Palpitations 05/20/2023   Iron deficiency anemia secondary to inadequate dietary iron intake 05/20/2023   Essential hypertension 12/17/2022   Insomnia 12/17/2022   Subacromial bursitis of left shoulder joint 08/29/2020   Tendinopathy of left rotator cuff 08/29/2020   Morbid obesity with BMI of 45.0-49.9, adult (HCC) 08/09/2020   Superior glenoid labrum lesion of left shoulder 07/10/2020   Health maintenance examination 03/23/2019   Prediabetes 22-Dec-2018   Strain of back 2018/12/22   Death of family member 12-22-2018   Back pain 07/31/2018   Other fatigue 05/31/2018   Chronic low back pain 11/30/2017   Shifting sleep-work schedule 06/15/2017   Paradoxical insomnia 06/15/2017   Sleep related headaches 06/15/2017   Super obese 06/15/2017   Mood complaints in sleep disorder 06/15/2017   Plantar fasciitis of left foot 03/01/2014   Porokeratosis 03/01/2014   Pain in lower limb 03/01/2014    PCP: Arnette Felts, FNP  REFERRING PROVIDER: Juanda Chance, NP  REFERRING DIAG: Myofascial pain syndrome  THERAPY DIAG:  No diagnosis found.  Rationale for Evaluation and Treatment: Rehabilitation  ONSET DATE: Chronic   SUBJECTIVE:                                                                                                                                                                                                         SUBJECTIVE STATEMENT: Patient reports she is having pain in her neck, shoulders, and mid-upper back. She did have any operation on her left shoulder for a SLAP tear in 2021 that still gives her trouble. 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 She reports that it is an aching pain and can be over a 10 when it is hurting. She does try using modalities to help with her  pain and it does not take the pain completely away. She denies any numbness or tingling in her hands. She does have history of lower back pain as well, and has had injections on an ablation on 06/10/2023 that has seemed to help her sciatica symptoms  PERTINENT HISTORY:  Chronic neck and low back pain, Left shoulder surgery 2021  PAIN:  Are you having pain? Yes:  NPRS scale: 4/10 (10/10 at worst) Pain location: Neck, mid-upper back, shoulders Pain description: Aching, throbbing Aggravating factors: Pulling, tugging, lifting clients Relieving factors: Ice packs or heating pads, creams, Tylenol  PRECAUTIONS: None  RED FLAGS: None    WEIGHT BEARING RESTRICTIONS: No  FALLS:  Has patient fallen in last 6 months? No  OCCUPATION: CNA   PLOF: Independent  PATIENT GOALS: Pain relief   OBJECTIVE:  PATIENT SURVEYS:  FOTO 52% functional status  COGNITION: Overall cognitive status: Within functional limits for tasks assessed  SENSATION: Appears intact  POSTURE:   Rounded shoulder and forward head posture  PALPATION: Tender to palpation bilateral upper traps and periscapular musculature   CERVICAL ROM:   Active ROM A/PROM (deg) eval  Flexion 40  Extension 30  Right lateral flexion   Left lateral flexion   Right rotation 55  Left rotation 40   (Blank rows = not tested)  UPPER EXTREMITY ROM:   UE ROM grossly  WFL, she did report left shoulder pain with all movements  UPPER EXTREMITY MMT:  MMT Right eval Left eval  Shoulder flexion 4 4  Shoulder extension 4+ 4  Shoulder abduction 4 4  Shoulder adduction    Shoulder internal rotation    Shoulder external rotation 4 5  Middle trapezius 4- 4-  Lower trapezius 3 3  Elbow flexion    Elbow extension    Wrist flexion    Wrist extension    Wrist ulnar deviation    Wrist radial deviation    Wrist pronation    Wrist supination    Grip strength     (Blank rows = not tested)  CERVICAL SPECIAL TESTS:  Radicular testing negative for cervical region  FUNCTIONAL TESTS:  Not assessed   TODAY'S TREATMENT:         [facility] Adult PT Treatment:                                                DATE: 07/27/23 Therapeutic Exercise: *** Manual Therapy: *** Neuromuscular re-ed: *** Therapeutic Activity: *** Modalities: *** Self Care: ***  [facility] PT Treatment:                                                DATE: 07/21/2023 Therapeutic Exercise: Sidelying thoracic rotation x 3 each Row with green x 10 ER with yellow x 10 Instruction on SMFR using tennis ball for upper back and neck  PATIENT EDUCATION:  Education details: Exam findings, POC, HEP Person educated: Patient Education method: Explanation, Demonstration, Tactile cues, Verbal cues, and Handouts Education comprehension: verbalized understanding, returned demonstration, verbal cues required, tactile cues required, and needs further education  HOME EXERCISE PROGRAM: Access Code: [email or web address]    ASSESSMENT: CLINICAL IMPRESSION: Patient is a 42 y.o. female who was seen today for physical therapy evaluation  and treatment for chronic neck and upper back pain. Evaluation limited due to patient arriving late. Her pain seems primarily muscular at this time with limitations in cervical motion, postural deviations with poor postural strength, increased neck and periscapular tenderness and  muscle tension.    OBJECTIVE IMPAIRMENTS: decreased activity tolerance, decreased ROM, decreased strength, impaired flexibility, improper body mechanics, postural dysfunction, and pain.   ACTIVITY LIMITATIONS: carrying, lifting, bending, sleeping, hygiene/grooming, and caring for others  PARTICIPATION LIMITATIONS: meal prep, cleaning, driving, shopping, community activity, and occupation  PERSONAL FACTORS: Fitness, Past/current experiences, Time since onset of injury/illness/exacerbation, and 1-2 comorbidities: see PMH above  are also affecting patient's functional outcome.   REHAB POTENTIAL: Good  CLINICAL DECISION MAKING: Evolving/moderate complexity  EVALUATION COMPLEXITY: Moderate   GOALS: Goals reviewed with patient? Yes  SHORT TERM GOALS: Target date: 08/18/2023  Patient will be I with initial HEP in order to progress with therapy. Baseline: HEP provided at eval Goal status: INITIAL  2.  Patient will report neck pain </= 8/10 with work or activity in order to reduce functional limitations Baseline: 10/10 pain Goal status: INITIAL  LONG TERM GOALS: Target date: 09/15/2023  Patient will be I with final HEP to maintain progress from PT. Baseline: HEP provided at eval Goal status: INITIAL  2.  Patient will report >/= 61% status on FOTO to indicate improved functional ability. Baseline: 52% functional status Goal status: INITIAL  3.  Patient will demonstrate cervical rotation >/= 60 deg bilaterally in order to reduce neck tension and improve driving ability Baseline: see limitations above Goal status: INITIAL  4.  Patient will demonstrate periscapular strength >/= 4/5 MMT in order to improve postural control and reduce pain with work related tasks such as pulling  Baseline: see limitations above Goal status: INITIAL   PLAN: PT FREQUENCY: 1-2x/week  PT DURATION: 8 weeks  PLANNED INTERVENTIONS: Therapeutic exercises, Therapeutic activity, Neuromuscular  re-education, Balance training, Gait training, Patient/Family education, Self Care, Joint mobilization, Joint manipulation, Aquatic Therapy, Dry Needling, Electrical stimulation, Spinal manipulation, Spinal mobilization, Cryotherapy, Moist heat, Taping, Ionotophoresis 4mg /ml Dexamethasone, Manual therapy, and Re-evaluation  PLAN FOR NEXT SESSION: Review HEP and progress PRN, manual/TPDN for upper traps and periscapular muscles, progress postural control, periscapular strengthening, left rotator cuff strengthening, cervical and thoracic mobility   Rosana Hoes, PT, DPT, LAT, ATC 07/27/23  7:58 AM Phone: [phone] Fax: [phone]

## 2023-08-03 ENCOUNTER — Ambulatory Visit: Payer: 59 | Admitting: Physical Therapy

## 2023-08-03 ENCOUNTER — Telehealth: Payer: Self-pay | Admitting: Physical Therapy

## 2023-08-03 NOTE — Telephone Encounter (Signed)
Attempted to contact patient due to missed PT appointment. Left voicemail informing patient of the missed appointment and next scheduled appointment. Reminded patient of attendance policy.   Rosana Hoes, PT, DPT, LAT, ATC 08/03/23  1:11 PM Phone: [phone] Fax: [phone]

## 2023-08-04 ENCOUNTER — Other Ambulatory Visit: Payer: Self-pay | Admitting: Nurse Practitioner

## 2023-08-04 DIAGNOSIS — R7303 Prediabetes: Secondary | ICD-10-CM

## 2023-08-05 ENCOUNTER — Telehealth: Payer: Self-pay | Admitting: Pharmacist

## 2023-08-05 NOTE — Progress Notes (Signed)
08/05/2023  Patient ID: Deborah Jacobson, female   DOB: 05/15/1981, 42 y.o.   MRN: [id number]  Patient appearing on report for [employer] [facility] - Hypertension Control report due to last documented ambulatory blood pressure of 129/91 on 06/10/23. Next appointment with PCP is 11/23/23.   Outreached patient to discuss hypertension control and medication management.   Current antihypertensives: Hydrochlorothiazide 12.5mg   Patient has an automated upper arm home BP machine.  Current blood pressure readings: 128/88- well controlled at home   Patient denies hypotensive signs and symptoms including dizziness, lightheadedness.  Patient denies hypertensive symptoms including headache, chest pain, shortness of breath.  Patient denies side effects related to medications     Assessment/Plan: - Currently controlled - - Reviewed goal blood pressure <130/80 - Reviewed appropriate administration of medication regimen  *Of note, appears patient needs refill on hydrochlorothiazide per pharmacy records; has a few pills left at this time  Also missed PT appt on Monday due to oversleeping- just returned form a [employer]; also reviewed back pain- she's using Diclofenac and ibuprofen to help at this time  Marlowe Aschoff, PharmD [facility] Phone Number: [phone]

## 2023-08-06 ENCOUNTER — Other Ambulatory Visit: Payer: Self-pay

## 2023-08-06 DIAGNOSIS — R7303 Prediabetes: Secondary | ICD-10-CM

## 2023-08-06 MED ORDER — HYDROCHLOROTHIAZIDE 12.5 MG PO TABS
ORAL_TABLET | ORAL | 2 refills | Status: DC
Start: 2023-08-06 — End: 2024-05-09

## 2023-08-10 ENCOUNTER — Ambulatory Visit: Payer: 59 | Admitting: Physical Therapy

## 2023-08-10 ENCOUNTER — Encounter: Payer: Self-pay | Admitting: Physical Therapy

## 2023-08-10 ENCOUNTER — Other Ambulatory Visit: Payer: Self-pay

## 2023-08-10 DIAGNOSIS — M6281 Muscle weakness (generalized): Secondary | ICD-10-CM | POA: Diagnosis not present

## 2023-08-10 DIAGNOSIS — M546 Pain in thoracic spine: Secondary | ICD-10-CM | POA: Diagnosis not present

## 2023-08-10 DIAGNOSIS — M542 Cervicalgia: Secondary | ICD-10-CM | POA: Diagnosis not present

## 2023-08-10 DIAGNOSIS — R293 Abnormal posture: Secondary | ICD-10-CM | POA: Diagnosis not present

## 2023-08-10 DIAGNOSIS — M7918 Myalgia, other site: Secondary | ICD-10-CM | POA: Diagnosis not present

## 2023-08-10 NOTE — Therapy (Signed)
OUTPATIENT PHYSICAL THERAPY EVALUATION   Patient Name: Deborah Jacobson MRN: [id number] DOB:1981/04/05, 42 y.o., female Today's Date: 08/10/2023   END OF SESSION:  PT End of Session - 08/10/23 0851     Visit Number 2    Number of Visits 17    Date for PT Re-Evaluation 09/15/23    Authorization Type [employer] / [facility] MCD    PT Start Time 0845    PT Stop Time 0925    PT Time Calculation (min) 40 min    Activity Tolerance Patient tolerated treatment well    Behavior During Therapy WFL for tasks assessed/performed              Past Medical History:  Diagnosis Date   Acid reflux    Diabetes in pregnancy    Hypertension    Seasonal allergies    Past Surgical History:  Procedure Laterality Date   CESAREAN SECTION     SHOULDER ARTHROSCOPY WITH BICEPS TENDON REPAIR Left 08/29/2020   Procedure: LEFT SHOULDER ARTHROSCOPY WITH BICEPS TENODESIS;  Surgeon: Tarry Kos, MD;  Location: [facility];  Service: Orthopedics;  Laterality: Left;   TUBAL LIGATION     Patient Active Problem List   Diagnosis Date Noted   Palpitations 05/20/2023   Iron deficiency anemia secondary to inadequate dietary iron intake 05/20/2023   Essential hypertension 12/17/2022   Insomnia 12/17/2022   Subacromial bursitis of left shoulder joint 08/29/2020   Tendinopathy of left rotator cuff 08/29/2020   Morbid obesity with BMI of 45.0-49.9, adult (HCC) 08/09/2020   Superior glenoid labrum lesion of left shoulder 07/10/2020   Health maintenance examination 03/23/2019   Prediabetes 2018-12-30   Strain of back 12/30/2018   Death of family member 12-30-18   Back pain 07/31/2018   Other fatigue 05/31/2018   Chronic low back pain 11/30/2017   Shifting sleep-work schedule 06/15/2017   Paradoxical insomnia 06/15/2017   Sleep related headaches 06/15/2017   Super obese 06/15/2017   Mood complaints in sleep disorder 06/15/2017   Plantar fasciitis of left foot 03/01/2014   Porokeratosis  03/01/2014   Pain in lower limb 03/01/2014    PCP: Arnette Felts, FNP  REFERRING PROVIDER: Juanda Chance, NP  REFERRING DIAG: Myofascial pain syndrome  THERAPY DIAG:  Cervicalgia  Pain in thoracic spine  Muscle weakness (generalized)  Abnormal posture  Rationale for Evaluation and Treatment: Rehabilitation  ONSET DATE: Chronic   SUBJECTIVE:  SUBJECTIVE STATEMENT: Patient reports she is improving, she has been doing her exercises some, not everyday but they are helping her. 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   PERTINENT HISTORY:  Chronic neck and low back pain, Left shoulder surgery 2021  PAIN:  Are you having pain? Yes:  NPRS scale: 0/10 (10/10 at worst) Pain location: Neck, mid-upper back, shoulders Pain description: Aching, throbbing Aggravating factors: Pulling, tugging, lifting clients Relieving factors: Ice packs or heating pads, creams, Tylenol  PRECAUTIONS: None  PATIENT GOALS: Pain relief   OBJECTIVE:  PATIENT SURVEYS:  FOTO 52% functional status  08/10/2023: 68% (met goal)  COGNITION: Overall cognitive status: Within functional limits for tasks assessed  SENSATION: Appears intact  POSTURE:   Rounded shoulder and forward head posture  PALPATION: Tender to palpation bilateral upper traps and periscapular musculature   CERVICAL ROM:   Active ROM A/PROM (deg) eval  Flexion 40  Extension 30  Right lateral flexion   Left lateral flexion   Right rotation 55  Left rotation 40   (Blank rows = not tested)  UPPER EXTREMITY ROM:   UE ROM grossly WFL, she did report left shoulder pain with all movements  UPPER EXTREMITY MMT:  MMT Right eval Left eval  Shoulder  flexion 4 4  Shoulder extension 4+ 4  Shoulder abduction 4 4  Shoulder adduction    Shoulder internal rotation    Shoulder external rotation 4 5  Middle trapezius 4- 4-  Lower trapezius 3 3  Elbow flexion    Elbow extension    Wrist flexion    Wrist extension    Wrist ulnar deviation    Wrist radial deviation    Wrist pronation    Wrist supination    Grip strength     (Blank rows = not tested)  CERVICAL SPECIAL TESTS:  Radicular testing negative for cervical region  FUNCTIONAL TESTS:  Not assessed   TODAY'S TREATMENT:       [facility] Adult PT Treatment:                                                DATE: 08/10/2023 Therapeutic Exercise: UBE L1 x 4 min (fwd/bwd) while taking subjective Sidelying thoracic rotation 10 x 10 sec each Cat cow x 10 Seated horizontal abduction with yellow 2 x 10  Row with green 2 x 10 Extension with yellow 2 x 10 ER with yellow 2 x 10   [facility] Adult PT Treatment:                                                DATE: 07/21/2023 Therapeutic Exercise: Sidelying thoracic rotation x 3 each Row with green x 10 ER with yellow x 10 Instruction on SMFR using tennis ball for upper back and neck  PATIENT EDUCATION:  Education details: HEP update Person educated: Patient Education method: Explanation, Demonstration, Tactile cues, Verbal cues, and Handouts Education comprehension: verbalized understanding, returned demonstration, verbal cues required, tactile cues required, and needs further education  HOME EXERCISE PROGRAM: Access Code: [email or web address]    ASSESSMENT: CLINICAL IMPRESSION: Patient tolerated therapy well with no adverse effects. Therapy  focused on progressing her mobility and posture strengthening with good tolerance. She reports great improvement in her pain and functional ability this visit, achieving her FOTO LTG. She stated that she felt like she was doing well enough she did not need any manual therapy this visit. She did well with postural  strength progression but does require occasional cueing for posture to avoid shrug. Updated her HEP to progress posture strengthening. Patient would benefit from continued skilled PT to progress her mobility and strength in order to reduce pain and maximize functional ability.   OBJECTIVE IMPAIRMENTS: decreased activity tolerance, decreased ROM, decreased strength, impaired flexibility, improper body mechanics, postural dysfunction, and pain.   ACTIVITY LIMITATIONS: carrying, lifting, bending, sleeping, hygiene/grooming, and caring for others  PARTICIPATION LIMITATIONS: meal prep, cleaning, driving, shopping, community activity, and occupation  PERSONAL FACTORS: Fitness, Past/current experiences, Time since onset of injury/illness/exacerbation, and 1-2 comorbidities: see PMH above  are also affecting patient's functional outcome.    GOALS: Goals reviewed with patient? Yes  SHORT TERM GOALS: Target date: 08/18/2023  Patient will be I with initial HEP in order to progress with therapy. Baseline: HEP provided at eval 08/10/2023: progressing Goal status: ONGOING  2.  Patient will report neck pain </= 8/10 with work or activity in order to reduce functional limitations Baseline: 10/10 pain Goal status: INITIAL  LONG TERM GOALS: Target date: 09/15/2023  Patient will be I with final HEP to maintain progress from PT. Baseline: HEP provided at eval Goal status: INITIAL  2.  Patient will report >/= 61% status on FOTO to indicate improved functional ability. Baseline: 52% functional status 08/10/2023: 68% Goal status: MET  3.  Patient will demonstrate cervical rotation >/= 60 deg bilaterally in order to reduce neck tension and improve driving ability Baseline: see limitations above Goal status: INITIAL  4.  Patient will demonstrate periscapular strength >/= 4/5 MMT in order to improve postural control and reduce pain with work related tasks such as pulling  Baseline: see limitations  above Goal status: INITIAL   PLAN: PT FREQUENCY: 1-2x/week  PT DURATION: 8 weeks  PLANNED INTERVENTIONS: Therapeutic exercises, Therapeutic activity, Neuromuscular re-education, Balance training, Gait training, Patient/Family education, Self Care, Joint mobilization, Joint manipulation, Aquatic Therapy, Dry Needling, Electrical stimulation, Spinal manipulation, Spinal mobilization, Cryotherapy, Moist heat, Taping, Ionotophoresis 4mg /ml Dexamethasone, Manual therapy, and Re-evaluation  PLAN FOR NEXT SESSION: Review HEP and progress PRN, manual/TPDN for upper traps and periscapular muscles, progress postural control, periscapular strengthening, left rotator cuff strengthening, cervical and thoracic mobility   Rosana Hoes, PT, DPT, LAT, ATC 08/10/23  9:30 AM Phone: [phone] Fax: [phone]

## 2023-08-10 NOTE — Patient Instructions (Signed)
Access Code: [email or web address] URL: [email or web address] Date: 08/10/2023 Prepared by: Rosana Hoes  Exercises - Sidelying Thoracic Lumbar Rotation  - 1 x daily - 10 reps - 5 seconds hold - Seated Shoulder Horizontal Abduction with Resistance - Palms Down  - 1 x daily - 3 sets - 10 reps - Standing Row with Anchored Resistance  - 1 x daily - 3 sets - 10 reps - Shoulder External Rotation with Anchored Resistance  - 1 x daily - 3 sets - 10 reps - Standing Upper Trapezius Mobilization with Small Ball  - 3-5 minutes hold

## 2023-08-14 ENCOUNTER — Ambulatory Visit: Payer: Medicaid Other | Admitting: Physical Therapy

## 2023-08-17 ENCOUNTER — Other Ambulatory Visit: Payer: Self-pay

## 2023-08-17 ENCOUNTER — Ambulatory Visit: Payer: Medicaid Other | Attending: Physical Medicine and Rehabilitation | Admitting: Physical Therapy

## 2023-08-17 ENCOUNTER — Encounter: Payer: Self-pay | Admitting: Physical Therapy

## 2023-08-17 DIAGNOSIS — M6281 Muscle weakness (generalized): Secondary | ICD-10-CM | POA: Insufficient documentation

## 2023-08-17 DIAGNOSIS — M542 Cervicalgia: Secondary | ICD-10-CM | POA: Diagnosis present

## 2023-08-17 DIAGNOSIS — R293 Abnormal posture: Secondary | ICD-10-CM | POA: Diagnosis present

## 2023-08-17 DIAGNOSIS — M546 Pain in thoracic spine: Secondary | ICD-10-CM | POA: Diagnosis present

## 2023-08-17 NOTE — Therapy (Signed)
OUTPATIENT PHYSICAL THERAPY TREATMENT   Patient Name: Deborah Jacobson MRN: [id number] DOB:June 19, 1981, 42 y.o., female Today's Date: 08/17/2023   END OF SESSION:  PT End of Session - 08/17/23 0901     Visit Number 3    Number of Visits 17    Date for PT Re-Evaluation 09/15/23    Authorization Type [employer] / [employer] MCD    PT Start Time 0848    PT Stop Time 0930    PT Time Calculation (min) 42 min    Activity Tolerance Patient tolerated treatment well    Behavior During Therapy WFL for tasks assessed/performed               Past Medical History:  Diagnosis Date   Acid reflux    Diabetes in pregnancy    Hypertension    Seasonal allergies    Past Surgical History:  Procedure Laterality Date   CESAREAN SECTION     SHOULDER ARTHROSCOPY WITH BICEPS TENDON REPAIR Left 08/29/2020   Procedure: LEFT SHOULDER ARTHROSCOPY WITH BICEPS TENODESIS;  Surgeon: Tarry Kos, MD;  Location: [facility];  Service: Orthopedics;  Laterality: Left;   TUBAL LIGATION     Patient Active Problem List   Diagnosis Date Noted   Palpitations 05/20/2023   Iron deficiency anemia secondary to inadequate dietary iron intake 05/20/2023   Essential hypertension 12/17/2022   Insomnia 12/17/2022   Subacromial bursitis of left shoulder joint 08/29/2020   Tendinopathy of left rotator cuff 08/29/2020   Morbid obesity with BMI of 45.0-49.9, adult (HCC) 08/09/2020   Superior glenoid labrum lesion of left shoulder 07/10/2020   Health maintenance examination 03/23/2019   Prediabetes December 07, 2018   Strain of back 12/07/18   Death of family member 12/07/18   Back pain 07/31/2018   Other fatigue 05/31/2018   Chronic low back pain 11/30/2017   Shifting sleep-work schedule 06/15/2017   Paradoxical insomnia 06/15/2017   Sleep related headaches 06/15/2017   Super obese 06/15/2017   Mood complaints in sleep disorder 06/15/2017   Plantar fasciitis of left foot 03/01/2014   Porokeratosis  03/01/2014   Pain in lower limb 03/01/2014    PCP: Arnette Felts, FNP  REFERRING PROVIDER: Juanda Chance, NP  REFERRING DIAG: Myofascial pain syndrome  THERAPY DIAG:  Cervicalgia  Pain in thoracic spine  Muscle weakness (generalized)  Abnormal posture  Rationale for Evaluation and Treatment: Rehabilitation  ONSET DATE: Chronic   SUBJECTIVE:  SUBJECTIVE STATEMENT: Patient reports she is doing well, she is very tired this morning from working 3rd shift. She is having some tightness.  PERTINENT HISTORY:  Chronic neck and low back pain, Left shoulder surgery 2021  PAIN:  Are you having pain? Yes:  NPRS scale: 2/10 (6/10 at worst) Pain location: Neck, mid-upper back, shoulders Pain description: Tight, aching, throbbing Aggravating factors: Pulling, tugging, lifting clients Relieving factors: Ice packs or heating pads, creams, Tylenol  PRECAUTIONS: None  PATIENT GOALS: Pain relief   OBJECTIVE:  PATIENT SURVEYS:  FOTO 52% functional status  08/10/2023: 68% (met goal)  POSTURE:   Rounded shoulder and forward head posture  PALPATION: Tender to palpation bilateral upper traps and periscapular musculature   CERVICAL ROM:   Active ROM A/PROM (deg) eval  Flexion 40  Extension 30  Right lateral flexion   Left lateral flexion   Right rotation 55  Left rotation 40   (Blank rows = not tested)  UPPER EXTREMITY ROM:   UE ROM grossly WFL, she did report left shoulder pain with all movements  UPPER EXTREMITY MMT:  MMT Right eval Left eval Rt / Lt 08/17/2023  Shoulder flexion 4 4 5  / 5  Shoulder extension 4+ 4 5 / 5  Shoulder abduction 4 4 5  / 5  Shoulder adduction     Shoulder internal rotation     Shoulder external rotation 4 5 5  / 5  Middle trapezius 4- 4-    Lower trapezius 3 3   Elbow flexion     Elbow extension     Wrist flexion     Wrist extension     Wrist ulnar deviation     Wrist radial deviation     Wrist pronation     Wrist supination     Grip strength      (Blank rows = not tested)  CERVICAL SPECIAL TESTS:  Radicular testing negative for cervical region  FUNCTIONAL TESTS:  Not assessed   TODAY'S TREATMENT:       [facility] Adult PT Treatment:                                                DATE: 08/17/2023 Therapeutic Exercise: UBE L1 x 4 min (fwd/bwd) while taking subjective Sidelying thoracic rotation 10 x 10 sec each Supine horizontal abduction with green 2 x 20 Seated double ER and scap retraction with green 2 x 20 Seated horizontal abduction with green 2 x 10  Row machine 20# 2 x 15 Extension with yellow 2 x 10   [facility] Adult PT Treatment:                                                DATE: 08/10/2023 Therapeutic Exercise: UBE L1 x 4 min (fwd/bwd) while taking subjective Sidelying thoracic rotation 10 x 10 sec each Cat cow x 10 Seated horizontal abduction with yellow 2 x 10  Row with green 2 x 10 Extension with yellow 2 x 10 ER with yellow 2 x 10  [facility] Adult PT Treatment:  DATE: 07/21/2023 Therapeutic Exercise: Sidelying thoracic rotation x 3 each Row with green x 10 ER with yellow x 10 Instruction on SMFR using tennis ball for upper back and neck  PATIENT EDUCATION:  Education details: HEP Person educated: Patient Education method: Programmer, multimedia, Demonstration, Actor cues, Verbal cues Education comprehension: verbalized understanding, returned demonstration, verbal cues required, tactile cues required, and needs further education  HOME EXERCISE PROGRAM: Access Code: [email or web address]    ASSESSMENT: CLINICAL IMPRESSION: Patient tolerated therapy well with no adverse effects. Therapy continues to focus on progressing her postural control and strengthening. She was able  to progress with banded resistance with her exercises. She does exhibit improved shoulder strength this visit. No changes made to her HEP this visit. Patient would benefit from continued skilled PT to progress her mobility and strength in order to reduce pain and maximize functional ability.   OBJECTIVE IMPAIRMENTS: decreased activity tolerance, decreased ROM, decreased strength, impaired flexibility, improper body mechanics, postural dysfunction, and pain.   ACTIVITY LIMITATIONS: carrying, lifting, bending, sleeping, hygiene/grooming, and caring for others  PARTICIPATION LIMITATIONS: meal prep, cleaning, driving, shopping, community activity, and occupation  PERSONAL FACTORS: Fitness, Past/current experiences, Time since onset of injury/illness/exacerbation, and 1-2 comorbidities: see PMH above  are also affecting patient's functional outcome.    GOALS: Goals reviewed with patient? Yes  SHORT TERM GOALS: Target date: 08/18/2023  Patient will be I with initial HEP in order to progress with therapy. Baseline: HEP provided at eval 08/10/2023: progressing Goal status: ONGOING  2.  Patient will report neck pain </= 8/10 with work or activity in order to reduce functional limitations Baseline: 10/10 pain 08/17/2023: 6/10 Goal status: MET  LONG TERM GOALS: Target date: 09/15/2023  Patient will be I with final HEP to maintain progress from PT. Baseline: HEP provided at eval Goal status: INITIAL  2.  Patient will report >/= 61% status on FOTO to indicate improved functional ability. Baseline: 52% functional status 08/10/2023: 68% Goal status: MET  3.  Patient will demonstrate cervical rotation >/= 60 deg bilaterally in order to reduce neck tension and improve driving ability Baseline: see limitations above Goal status: INITIAL  4.  Patient will demonstrate periscapular strength >/= 4/5 MMT in order to improve postural control and reduce pain with work related tasks such as pulling   Baseline: see limitations above Goal status: INITIAL   PLAN: PT FREQUENCY: 1-2x/week  PT DURATION: 8 weeks  PLANNED INTERVENTIONS: Therapeutic exercises, Therapeutic activity, Neuromuscular re-education, Balance training, Gait training, Patient/Family education, Self Care, Joint mobilization, Joint manipulation, Aquatic Therapy, Dry Needling, Electrical stimulation, Spinal manipulation, Spinal mobilization, Cryotherapy, Moist heat, Taping, Ionotophoresis 4mg /ml Dexamethasone, Manual therapy, and Re-evaluation  PLAN FOR NEXT SESSION: Review HEP and progress PRN, manual/TPDN for upper traps and periscapular muscles, progress postural control, periscapular strengthening, left rotator cuff strengthening, cervical and thoracic mobility   Rosana Hoes, PT, DPT, LAT, ATC 08/17/23  9:31 AM Phone: [phone] Fax: [phone]

## 2023-08-21 ENCOUNTER — Ambulatory Visit: Payer: Medicaid Other | Admitting: Physical Therapy

## 2023-08-21 ENCOUNTER — Encounter: Payer: Self-pay | Admitting: Physical Therapy

## 2023-08-21 DIAGNOSIS — M542 Cervicalgia: Secondary | ICD-10-CM

## 2023-08-21 DIAGNOSIS — M546 Pain in thoracic spine: Secondary | ICD-10-CM

## 2023-08-21 NOTE — Therapy (Signed)
OUTPATIENT PHYSICAL THERAPY TREATMENT   Patient Name: Deborah Jacobson MRN: [id number] DOB:10/25/1981, 42 y.o., female Today's Date: 08/21/2023   END OF SESSION:  PT End of Session - 08/21/23 0846     Visit Number 4    Number of Visits 17    Date for PT Re-Evaluation 09/15/23    Authorization Type [employer] / [facility] MCD    PT Start Time 0845    PT Stop Time 0923    PT Time Calculation (min) 38 min               Past Medical History:  Diagnosis Date   Acid reflux    Diabetes in pregnancy    Hypertension    Seasonal allergies    Past Surgical History:  Procedure Laterality Date   CESAREAN SECTION     SHOULDER ARTHROSCOPY WITH BICEPS TENDON REPAIR Left 08/29/2020   Procedure: LEFT SHOULDER ARTHROSCOPY WITH BICEPS TENODESIS;  Surgeon: Tarry Kos, MD;  Location: [facility];  Service: Orthopedics;  Laterality: Left;   TUBAL LIGATION     Patient Active Problem List   Diagnosis Date Noted   Palpitations 05/20/2023   Iron deficiency anemia secondary to inadequate dietary iron intake 05/20/2023   Essential hypertension 12/17/2022   Insomnia 12/17/2022   Subacromial bursitis of left shoulder joint 08/29/2020   Tendinopathy of left rotator cuff 08/29/2020   Morbid obesity with BMI of 45.0-49.9, adult (HCC) 08/09/2020   Superior glenoid labrum lesion of left shoulder 07/10/2020   Health maintenance examination 03/23/2019   Prediabetes 14-Dec-2018   Strain of back 12/14/2018   Death of family member 14-Dec-2018   Back pain 07/31/2018   Other fatigue 05/31/2018   Chronic low back pain 11/30/2017   Shifting sleep-work schedule 06/15/2017   Paradoxical insomnia 06/15/2017   Sleep related headaches 06/15/2017   Super obese 06/15/2017   Mood complaints in sleep disorder 06/15/2017   Plantar fasciitis of left foot 03/01/2014   Porokeratosis 03/01/2014   Pain in lower limb 03/01/2014    PCP: Arnette Felts, FNP  REFERRING PROVIDER: Juanda Chance,  NP  REFERRING DIAG: Myofascial pain syndrome  THERAPY DIAG:  Cervicalgia  Pain in thoracic spine  Rationale for Evaluation and Treatment: Rehabilitation  ONSET DATE: Chronic   SUBJECTIVE:                                                                                                                                                                                                        SUBJECTIVE STATEMENT: Patient reports she is doing well,  she is very tired this morning from working 3rd shift. She is having some tightness.  PERTINENT HISTORY:  Chronic neck and low back pain, Left shoulder surgery 2021  PAIN:  Are you having pain? Yes:  NPRS scale: 2/10 (6/10 at worst) Pain location: Neck, mid-upper back, shoulders Pain description: Tight, aching, throbbing Aggravating factors: Pulling, tugging, lifting clients Relieving factors: Ice packs or heating pads, creams, Tylenol  PRECAUTIONS: None  PATIENT GOALS: Pain relief   OBJECTIVE:  PATIENT SURVEYS:  FOTO 52% functional status  08/10/2023: 68% (met goal)  POSTURE:   Rounded shoulder and forward head posture  PALPATION: Tender to palpation bilateral upper traps and periscapular musculature   CERVICAL ROM:   Active ROM A/PROM (deg) eval  Flexion 40  Extension 30  Right lateral flexion   Left lateral flexion   Right rotation 55  Left rotation 40   (Blank rows = not tested)  UPPER EXTREMITY ROM:   UE ROM grossly WFL, she did report left shoulder pain with all movements  UPPER EXTREMITY MMT:  MMT Right eval Left eval Rt / Lt 08/17/2023  Shoulder flexion 4 4 5  / 5  Shoulder extension 4+ 4 5 / 5  Shoulder abduction 4 4 5  / 5  Shoulder adduction     Shoulder internal rotation     Shoulder external rotation 4 5 5  / 5  Middle trapezius 4- 4-   Lower trapezius 3 3   Elbow flexion     Elbow extension     Wrist flexion     Wrist extension     Wrist ulnar deviation     Wrist radial deviation     Wrist  pronation     Wrist supination     Grip strength      (Blank rows = not tested)  CERVICAL SPECIAL TESTS:  Radicular testing negative for cervical region  FUNCTIONAL TESTS:  Not assessed   TODAY'S TREATMENT:       [facility] Adult PT Treatment:                                                DATE: 08/21/23 Therapeutic Exercise: UBE Level 2 2 min each way Seated Row 25# 15 x 2  Extension with red 2 x 10 Standing green band horiz abdct 10 x 2 Green Band ER bilat 10 x 2  Cat/ Camel  Qped UE raise x 10 Sidelying thoracic rotation 10 x 10 sec each Supine alternating diagoanls GTB x 10 Supine horiz abdct x 20 Supine shoulder ER bilat GTB x 20   [facility] Adult PT Treatment:                                                DATE: 08/17/2023 Therapeutic Exercise: UBE L1 x 4 min (fwd/bwd) while taking subjective Sidelying thoracic rotation 10 x 10 sec each Supine horizontal abduction with green 2 x 20 Seated double ER and scap retraction with green 2 x 20 Seated horizontal abduction with green 2 x 10  Row machine 20# 2 x 15 Extension with yellow 2 x 10    [facility] Adult PT Treatment:  DATE: 08/10/2023 Therapeutic Exercise: UBE L1 x 4 min (fwd/bwd) while taking subjective Sidelying thoracic rotation 10 x 10 sec each Cat cow x 10 Seated horizontal abduction with yellow 2 x 10  Row with green 2 x 10 Extension with yellow 2 x 10 ER with yellow 2 x 10  [facility] Adult PT Treatment:                                                DATE: 07/21/2023 Therapeutic Exercise: Sidelying thoracic rotation x 3 each Row with green x 10 ER with yellow x 10 Instruction on SMFR using tennis ball for upper back and neck  PATIENT EDUCATION:  Education details: HEP Person educated: Patient Education method: Programmer, multimedia, Demonstration, Actor cues, Verbal cues Education comprehension: verbalized understanding, returned demonstration, verbal cues required, tactile cues  required, and needs further education  HOME EXERCISE PROGRAM: Access Code: [email or web address]    ASSESSMENT: CLINICAL IMPRESSION: Pt reports decreased use of OTC meds and cream this week. A little soreness in neck and left arm today but better since leaving work this morning. She reports continued compliance with HEP and a positive response. Progressed to Qped neck/ scap stabilization with UE lifts and no increased pain. Also progressed supine scapular bands with no increased pain. Her HEP was updated.  Patient would benefit from continued skilled PT to progress her mobility and strength in order to reduce pain and maximize functional ability.   OBJECTIVE IMPAIRMENTS: decreased activity tolerance, decreased ROM, decreased strength, impaired flexibility, improper body mechanics, postural dysfunction, and pain.   ACTIVITY LIMITATIONS: carrying, lifting, bending, sleeping, hygiene/grooming, and caring for others  PARTICIPATION LIMITATIONS: meal prep, cleaning, driving, shopping, community activity, and occupation  PERSONAL FACTORS: Fitness, Past/current experiences, Time since onset of injury/illness/exacerbation, and 1-2 comorbidities: see PMH above  are also affecting patient's functional outcome.    GOALS: Goals reviewed with patient? Yes  SHORT TERM GOALS: Target date: 08/18/2023  Patient will be I with initial HEP in order to progress with therapy. Baseline: HEP provided at eval 08/10/2023: progressing Goal status: ONGOING  2.  Patient will report neck pain </= 8/10 with work or activity in order to reduce functional limitations Baseline: 10/10 pain 08/17/2023: 6/10 Goal status: MET  LONG TERM GOALS: Target date: 09/15/2023  Patient will be I with final HEP to maintain progress from PT. Baseline: HEP provided at eval Goal status: INITIAL  2.  Patient will report >/= 61% status on FOTO to indicate improved functional ability. Baseline: 52% functional status 08/10/2023: 68% Goal status:  MET  3.  Patient will demonstrate cervical rotation >/= 60 deg bilaterally in order to reduce neck tension and improve driving ability Baseline: see limitations above Goal status: INITIAL  4.  Patient will demonstrate periscapular strength >/= 4/5 MMT in order to improve postural control and reduce pain with work related tasks such as pulling  Baseline: see limitations above Goal status: INITIAL   PLAN: PT FREQUENCY: 1-2x/week  PT DURATION: 8 weeks  PLANNED INTERVENTIONS: Therapeutic exercises, Therapeutic activity, Neuromuscular re-education, Balance training, Gait training, Patient/Family education, Self Care, Joint mobilization, Joint manipulation, Aquatic Therapy, Dry Needling, Electrical stimulation, Spinal manipulation, Spinal mobilization, Cryotherapy, Moist heat, Taping, Ionotophoresis 4mg /ml Dexamethasone, Manual therapy, and Re-evaluation  PLAN FOR NEXT SESSION: Review HEP and progress PRN, manual/TPDN for upper traps and periscapular muscles, progress postural control, periscapular strengthening, left  rotator cuff strengthening, cervical and thoracic mobility   Jannette Spanner, PTA 08/21/23 9:24 AM Phone: [phone] Fax: [phone]

## 2023-08-24 ENCOUNTER — Ambulatory Visit: Payer: Medicaid Other | Admitting: Physical Therapy

## 2023-08-28 ENCOUNTER — Encounter: Payer: Self-pay | Admitting: Physical Therapy

## 2023-08-28 ENCOUNTER — Ambulatory Visit: Payer: Medicaid Other | Admitting: Physical Therapy

## 2023-08-28 ENCOUNTER — Other Ambulatory Visit: Payer: Self-pay

## 2023-08-28 DIAGNOSIS — M542 Cervicalgia: Secondary | ICD-10-CM | POA: Diagnosis not present

## 2023-08-28 DIAGNOSIS — M6281 Muscle weakness (generalized): Secondary | ICD-10-CM

## 2023-08-28 DIAGNOSIS — M546 Pain in thoracic spine: Secondary | ICD-10-CM

## 2023-08-28 NOTE — Therapy (Signed)
OUTPATIENT PHYSICAL THERAPY TREATMENT   Patient Name: Deborah Jacobson MRN: [id number] DOB:1981/09/01, 42 y.o., female Today's Date: 08/28/2023   END OF SESSION:  PT End of Session - 08/28/23 0942     Visit Number 5    Number of Visits 17    Date for PT Re-Evaluation 09/15/23    Authorization Type [employer] / [facility]CD    PT Start Time [phone]    PT Stop Time 1014    PT Time Calculation (min) 38 min    Activity Tolerance Patient tolerated treatment well    Behavior During Therapy WFL for tasks assessed/performed                Past Medical History:  Diagnosis Date   Acid reflux    Diabetes in pregnancy    Hypertension    Seasonal allergies    Past Surgical History:  Procedure Laterality Date   CESAREAN SECTION     SHOULDER ARTHROSCOPY WITH BICEPS TENDON REPAIR Left 08/29/2020   Procedure: LEFT SHOULDER ARTHROSCOPY WITH BICEPS TENODESIS;  Surgeon: Tarry Kos, MD;  Location: [facility];  Service: Orthopedics;  Laterality: Left;   TUBAL LIGATION     Patient Active Problem List   Diagnosis Date Noted   Palpitations 05/20/2023   Iron deficiency anemia secondary to inadequate dietary iron intake 05/20/2023   Essential hypertension 12/17/2022   Insomnia 12/17/2022   Subacromial bursitis of left shoulder joint 08/29/2020   Tendinopathy of left rotator cuff 08/29/2020   Morbid obesity with BMI of 45.0-49.9, adult (HCC) 08/09/2020   Superior glenoid labrum lesion of left shoulder 07/10/2020   Health maintenance examination 03/23/2019   Prediabetes 12/06/18   Strain of back 12-06-2018   Death of family member 12/06/18   Back pain 07/31/2018   Other fatigue 05/31/2018   Chronic low back pain 11/30/2017   Shifting sleep-work schedule 06/15/2017   Paradoxical insomnia 06/15/2017   Sleep related headaches 06/15/2017   Super obese 06/15/2017   Mood complaints in sleep disorder 06/15/2017   Plantar fasciitis of left foot 03/01/2014   Porokeratosis  03/01/2014   Pain in lower limb 03/01/2014    PCP: Arnette Felts, FNP  REFERRING PROVIDER: Juanda Chance, NP  REFERRING DIAG: Myofascial pain syndrome  THERAPY DIAG:  Cervicalgia  Pain in thoracic spine  Muscle weakness (generalized)  Rationale for Evaluation and Treatment: Rehabilitation  ONSET DATE: Chronic   SUBJECTIVE:  SUBJECTIVE STATEMENT: Patient reports she is doing alright, not really having any pain. She has been doing the exercises and uses the tennis ball.   PERTINENT HISTORY:  Chronic neck and low back pain, Left shoulder surgery 2021  PAIN:  Are you having pain? Yes:  NPRS scale: 1/10 (6/10 at worst) Pain location: Neck, mid-upper back, shoulders Pain description: Tight, aching Aggravating factors: Pulling, tugging, lifting clients Relieving factors: Ice packs or heating pads, creams, Tylenol  PRECAUTIONS: None  PATIENT GOALS: Pain relief   OBJECTIVE:  PATIENT SURVEYS:  FOTO 52% functional status  08/10/2023: 68% (met goal)  POSTURE:   Rounded shoulder and forward head posture  PALPATION: Tender to palpation bilateral upper traps and periscapular musculature   CERVICAL ROM:   Active ROM A/PROM (deg) eval  Flexion 40  Extension 30  Right lateral flexion   Left lateral flexion   Right rotation 55  Left rotation 40   (Blank rows = not tested)  UPPER EXTREMITY ROM:   UE ROM grossly WFL, she did report left shoulder pain with all movements  UPPER EXTREMITY MMT:  MMT Right eval Left eval Rt / Lt 08/17/2023  Shoulder flexion 4 4 5  / 5  Shoulder extension 4+ 4 5 / 5  Shoulder abduction 4 4 5  / 5  Shoulder adduction     Shoulder internal rotation     Shoulder external rotation 4 5 5  / 5  Middle trapezius 4- 4-   Lower trapezius 3 3    Elbow flexion     Elbow extension     Wrist flexion     Wrist extension     Wrist ulnar deviation     Wrist radial deviation     Wrist pronation     Wrist supination     Grip strength      (Blank rows = not tested)  CERVICAL SPECIAL TESTS:  Radicular testing negative for cervical region  FUNCTIONAL TESTS:  Not assessed   TODAY'S TREATMENT:       [facility] Adult PT Treatment:                                                DATE: 08/28/23 Therapeutic Exercise: UBE L2 x 4 min (fwd/bwd) to improve shoulder endurance Sidelying thoracic rotation 10 x 10 sec each Seated horizontal abduction with green 2 x 15 Seated diagonals with green 2 x 10 each Seated upper trap stretch 2 x 15 sec each Row machine 25# 3 x 15 Extension with green 2 x 15 High row with blue 2 x 10 ER with blue 2 x 15 each   [facility] Adult PT Treatment:                                                DATE: 08/21/23 Therapeutic Exercise: UBE Level 2 2 min each way Seated Row 25# 15 x 2  Extension with red 2 x 10 Standing green band horiz abdct 10 x 2 Green Band ER bilat 10 x 2  Cat/ Camel  Qped UE raise x 10 Sidelying thoracic rotation 10 x 10 sec each Supine alternating diagoanls GTB x 10 Supine horiz abdct x 20 Supine shoulder ER bilat GTB x 20  [facility] Adult PT Treatment:                                                DATE: 08/17/2023 Therapeutic Exercise: UBE L1 x 4 min (fwd/bwd) while taking subjective Sidelying thoracic rotation 10 x 10 sec each Supine horizontal abduction with green 2 x 20 Seated double ER and scap retraction with green 2 x 20 Seated horizontal abduction with green 2 x 10  Row machine 20# 2 x 15 Extension with yellow 2 x 10   [facility] Adult PT Treatment:                                                DATE: 08/10/2023 Therapeutic Exercise: UBE L1 x 4 min (fwd/bwd) while taking subjective Sidelying thoracic rotation 10 x 10 sec each Cat cow x 10 Seated horizontal abduction with yellow 2 x 10   Row with green 2 x 10 Extension with yellow 2 x 10 ER with yellow 2 x 10  PATIENT EDUCATION:  Education details: HEP Person educated: Patient Education method: Programmer, multimedia, Demonstration, Actor cues, Verbal cues Education comprehension: verbalized understanding, returned demonstration, verbal cues required, tactile cues required, and needs further education  HOME EXERCISE PROGRAM: Access Code: [email or web address]    ASSESSMENT: CLINICAL IMPRESSION: Patient tolerated therapy well with no adverse effects. Therapy focused on progressing postural strength with good tolerance. She does require occasional cueing for posture to avoid shrugging her shoulder but seems to be progressing well with therapy. No changes to her HEP this visit. Patient would benefit from continued skilled PT to progress her mobility and strength in order to reduce pain and maximize functional ability.   OBJECTIVE IMPAIRMENTS: decreased activity tolerance, decreased ROM, decreased strength, impaired flexibility, improper body mechanics, postural dysfunction, and pain.   ACTIVITY LIMITATIONS: carrying, lifting, bending, sleeping, hygiene/grooming, and caring for others  PARTICIPATION LIMITATIONS: meal prep, cleaning, driving, shopping, community activity, and occupation  PERSONAL FACTORS: Fitness, Past/current experiences, Time since onset of injury/illness/exacerbation, and 1-2 comorbidities: see PMH above  are also affecting patient's functional outcome.    GOALS: Goals reviewed with patient? Yes  SHORT TERM GOALS: Target date: 08/18/2023  Patient will be I with initial HEP in order to progress with therapy. Baseline: HEP provided at eval 08/10/2023: progressing 08/28/2023: independent with initial HEP Goal status: MET  2.  Patient will report neck pain </= 8/10 with work or activity in order to reduce functional limitations Baseline: 10/10 pain 08/17/2023: 6/10 Goal status: MET  LONG TERM GOALS: Target date:  09/15/2023  Patient will be I with final HEP to maintain progress from PT. Baseline: HEP provided at eval Goal status: INITIAL  2.  Patient will report >/= 61% status on FOTO to indicate improved functional ability. Baseline: 52% functional status 08/10/2023: 68% Goal status: MET  3.  Patient will demonstrate cervical rotation >/= 60 deg bilaterally in order to reduce neck tension and improve driving ability Baseline: see limitations above Goal status: INITIAL  4.  Patient will demonstrate periscapular strength >/= 4/5 MMT in order to improve postural control and reduce pain with work related tasks such as pulling  Baseline: see limitations above Goal status: INITIAL   PLAN: PT FREQUENCY: 1-2x/week  PT DURATION:  8 weeks  PLANNED INTERVENTIONS: Therapeutic exercises, Therapeutic activity, Neuromuscular re-education, Balance training, Gait training, Patient/Family education, Self Care, Joint mobilization, Joint manipulation, Aquatic Therapy, Dry Needling, Electrical stimulation, Spinal manipulation, Spinal mobilization, Cryotherapy, Moist heat, Taping, Ionotophoresis 4mg /ml Dexamethasone, Manual therapy, and Re-evaluation  PLAN FOR NEXT SESSION: Review HEP and progress PRN, manual/TPDN for upper traps and periscapular muscles, progress postural control, periscapular strengthening, left rotator cuff strengthening, cervical and thoracic mobility   Rosana Hoes, PT, DPT, LAT, ATC 08/28/23  10:17 AM Phone: [phone] Fax: [phone]

## 2023-08-29 ENCOUNTER — Encounter: Payer: Self-pay | Admitting: Physical Therapy

## 2023-08-31 ENCOUNTER — Ambulatory Visit: Payer: Medicaid Other | Admitting: Physical Therapy

## 2023-08-31 NOTE — Therapy (Deleted)
OUTPATIENT PHYSICAL THERAPY TREATMENT   Patient Name: Deborah Jacobson MRN: [id number] DOB:1980-11-18, 42 y.o., female Today's Date: 08/31/2023   END OF SESSION:       Past Medical History:  Diagnosis Date   Acid reflux    Diabetes in pregnancy    Hypertension    Seasonal allergies    Past Surgical History:  Procedure Laterality Date   CESAREAN SECTION     SHOULDER ARTHROSCOPY WITH BICEPS TENDON REPAIR Left 08/29/2020   Procedure: LEFT SHOULDER ARTHROSCOPY WITH BICEPS TENODESIS;  Surgeon: Tarry Kos, MD;  Location: [facility];  Service: Orthopedics;  Laterality: Left;   TUBAL LIGATION     Patient Active Problem List   Diagnosis Date Noted   Palpitations 05/20/2023   Iron deficiency anemia secondary to inadequate dietary iron intake 05/20/2023   Essential hypertension 12/17/2022   Insomnia 12/17/2022   Subacromial bursitis of left shoulder joint 08/29/2020   Tendinopathy of left rotator cuff 08/29/2020   Morbid obesity with BMI of 45.0-49.9, adult (HCC) 08/09/2020   Superior glenoid labrum lesion of left shoulder 07/10/2020   Health maintenance examination 03/23/2019   Prediabetes 12/03/18   Strain of back December 03, 2018   Death of family member 03-Dec-2018   Back pain 07/31/2018   Other fatigue 05/31/2018   Chronic low back pain 11/30/2017   Shifting sleep-work schedule 06/15/2017   Paradoxical insomnia 06/15/2017   Sleep related headaches 06/15/2017   Super obese 06/15/2017   Mood complaints in sleep disorder 06/15/2017   Plantar fasciitis of left foot 03/01/2014   Porokeratosis 03/01/2014   Pain in lower limb 03/01/2014    PCP: Arnette Felts, FNP  REFERRING PROVIDER: Juanda Chance, NP  REFERRING DIAG: Myofascial pain syndrome  THERAPY DIAG:  No diagnosis found.  Rationale for Evaluation and Treatment: Rehabilitation  ONSET DATE: Chronic   SUBJECTIVE:                                                                                                                                                                                                         SUBJECTIVE STATEMENT: Patient reports she is doing alright, not really having any pain. She has been doing the exercises and uses the tennis ball.   PERTINENT HISTORY:  Chronic neck and low back pain, Left shoulder surgery 2021  PAIN:  Are you having pain? Yes:  NPRS scale: 1/10 (6/10 at worst) Pain location: Neck, mid-upper back, shoulders Pain description: Tight, aching Aggravating factors: Pulling, tugging, lifting clients Relieving factors: Ice packs or heating pads, creams, Tylenol  PRECAUTIONS:  None  PATIENT GOALS: Pain relief   OBJECTIVE:  PATIENT SURVEYS:  FOTO 52% functional status  08/10/2023: 68% (met goal)  POSTURE:   Rounded shoulder and forward head posture  PALPATION: Tender to palpation bilateral upper traps and periscapular musculature   CERVICAL ROM:   Active ROM A/PROM (deg) eval  Flexion 40  Extension 30  Right lateral flexion   Left lateral flexion   Right rotation 55  Left rotation 40   (Blank rows = not tested)  UPPER EXTREMITY ROM:   UE ROM grossly WFL, she did report left shoulder pain with all movements  UPPER EXTREMITY MMT:  MMT Right eval Left eval Rt / Lt 08/17/2023  Shoulder flexion 4 4 5  / 5  Shoulder extension 4+ 4 5 / 5  Shoulder abduction 4 4 5  / 5  Shoulder adduction     Shoulder internal rotation     Shoulder external rotation 4 5 5  / 5  Middle trapezius 4- 4-   Lower trapezius 3 3   Elbow flexion     Elbow extension     Wrist flexion     Wrist extension     Wrist ulnar deviation     Wrist radial deviation     Wrist pronation     Wrist supination     Grip strength      (Blank rows = not tested)  CERVICAL SPECIAL TESTS:  Radicular testing negative for cervical region  FUNCTIONAL TESTS:  Not assessed   TODAY'S TREATMENT:        [facility] Adult PT Treatment:                                                 DATE: 08/31/23 Therapeutic Exercise: *** Manual Therapy: *** Neuromuscular re-ed: *** Therapeutic Activity: *** Modalities: *** Self Care: ***  [facility] Adult PT Treatment:                                                DATE: 08/28/23 Therapeutic Exercise: UBE L2 x 4 min (fwd/bwd) to improve shoulder endurance Sidelying thoracic rotation 10 x 10 sec each Seated horizontal abduction with green 2 x 15 Seated diagonals with green 2 x 10 each Seated upper trap stretch 2 x 15 sec each Row machine 25# 3 x 15 Extension with green 2 x 15 High row with blue 2 x 10 ER with blue 2 x 15 each   [facility] Adult PT Treatment:                                                DATE: 08/21/23 Therapeutic Exercise: UBE Level 2 2 min each way Seated Row 25# 15 x 2  Extension with red 2 x 10 Standing green band horiz abdct 10 x 2 Green Band ER bilat 10 x 2  Cat/ Camel  Qped UE raise x 10 Sidelying thoracic rotation 10 x 10 sec each Supine alternating diagoanls GTB x 10 Supine horiz abdct x 20 Supine shoulder ER bilat GTB x 20  [facility] Adult PT Treatment:  DATE: 08/17/2023 Therapeutic Exercise: UBE L1 x 4 min (fwd/bwd) while taking subjective Sidelying thoracic rotation 10 x 10 sec each Supine horizontal abduction with green 2 x 20 Seated double ER and scap retraction with green 2 x 20 Seated horizontal abduction with green 2 x 10  Row machine 20# 2 x 15 Extension with yellow 2 x 10   [facility] Adult PT Treatment:                                                DATE: 08/10/2023 Therapeutic Exercise: UBE L1 x 4 min (fwd/bwd) while taking subjective Sidelying thoracic rotation 10 x 10 sec each Cat cow x 10 Seated horizontal abduction with yellow 2 x 10  Row with green 2 x 10 Extension with yellow 2 x 10 ER with yellow 2 x 10  PATIENT EDUCATION:  Education details: HEP Person educated: Patient Education method: Programmer, multimedia, Demonstration,  Actor cues, Verbal cues Education comprehension: verbalized understanding, returned demonstration, verbal cues required, tactile cues required, and needs further education  HOME EXERCISE PROGRAM: Access Code: [email or web address]    ASSESSMENT: CLINICAL IMPRESSION: Patient tolerated therapy well with no adverse effects. Therapy focused on progressing postural strength with good tolerance. She does require occasional cueing for posture to avoid shrugging her shoulder but seems to be progressing well with therapy. No changes to her HEP this visit. Patient would benefit from continued skilled PT to progress her mobility and strength in order to reduce pain and maximize functional ability.   OBJECTIVE IMPAIRMENTS: decreased activity tolerance, decreased ROM, decreased strength, impaired flexibility, improper body mechanics, postural dysfunction, and pain.   ACTIVITY LIMITATIONS: carrying, lifting, bending, sleeping, hygiene/grooming, and caring for others  PARTICIPATION LIMITATIONS: meal prep, cleaning, driving, shopping, community activity, and occupation  PERSONAL FACTORS: Fitness, Past/current experiences, Time since onset of injury/illness/exacerbation, and 1-2 comorbidities: see PMH above  are also affecting patient's functional outcome.    GOALS: Goals reviewed with patient? Yes  SHORT TERM GOALS: Target date: 08/18/2023  Patient will be I with initial HEP in order to progress with therapy. Baseline: HEP provided at eval 08/10/2023: progressing 08/28/2023: independent with initial HEP Goal status: MET  2.  Patient will report neck pain </= 8/10 with work or activity in order to reduce functional limitations Baseline: 10/10 pain 08/17/2023: 6/10 Goal status: MET  LONG TERM GOALS: Target date: 09/15/2023  Patient will be I with final HEP to maintain progress from PT. Baseline: HEP provided at eval Goal status: INITIAL  2.  Patient will report >/= 61% status on FOTO to indicate improved  functional ability. Baseline: 52% functional status 08/10/2023: 68% Goal status: MET  3.  Patient will demonstrate cervical rotation >/= 60 deg bilaterally in order to reduce neck tension and improve driving ability Baseline: see limitations above Goal status: INITIAL  4.  Patient will demonstrate periscapular strength >/= 4/5 MMT in order to improve postural control and reduce pain with work related tasks such as pulling  Baseline: see limitations above Goal status: INITIAL   PLAN: PT FREQUENCY: 1-2x/week  PT DURATION: 8 weeks  PLANNED INTERVENTIONS: Therapeutic exercises, Therapeutic activity, Neuromuscular re-education, Balance training, Gait training, Patient/Family education, Self Care, Joint mobilization, Joint manipulation, Aquatic Therapy, Dry Needling, Electrical stimulation, Spinal manipulation, Spinal mobilization, Cryotherapy, Moist heat, Taping, Ionotophoresis 4mg /ml Dexamethasone, Manual therapy, and Re-evaluation  PLAN FOR NEXT SESSION: Review HEP  and progress PRN, manual/TPDN for upper traps and periscapular muscles, progress postural control, periscapular strengthening, left rotator cuff strengthening, cervical and thoracic mobility   Rosana Hoes, PT, DPT, LAT, ATC 08/31/23  7:56 AM Phone: [phone] Fax: [phone]

## 2023-09-02 NOTE — Therapy (Unsigned)
OUTPATIENT PHYSICAL THERAPY TREATMENT   Patient Name: Deborah Jacobson MRN: [id number] DOB:1981/02/09, 42 y.o., female Today's Date: 09/03/2023  PHYSICAL THERAPY DISCHARGE SUMMARY  Visits from Start of Care: 6  Current functional level related to goals / functional outcomes: See below for goals.    Remaining deficits: Endurance, tension in neck at times.    Education / Equipment: Progress, HEP, lifting and technique for exercises    Patient agrees to discharge. Patient goals were met. Patient is being discharged due to meeting the stated rehab goals.    END OF SESSION:  PT End of Session - 09/03/23 0938     Visit Number 6    Number of Visits 17    Date for PT Re-Evaluation 09/15/23    Authorization Type [employer] / [employer] MCD    PT Start Time 0935    PT Stop Time 1015    PT Time Calculation (min) 40 min    Activity Tolerance Patient tolerated treatment well    Behavior During Therapy WFL for tasks assessed/performed                 Past Medical History:  Diagnosis Date   Acid reflux    Diabetes in pregnancy    Hypertension    Seasonal allergies    Past Surgical History:  Procedure Laterality Date   CESAREAN SECTION     SHOULDER ARTHROSCOPY WITH BICEPS TENDON REPAIR Left 08/29/2020   Procedure: LEFT SHOULDER ARTHROSCOPY WITH BICEPS TENODESIS;  Surgeon: Tarry Kos, MD;  Location: [facility];  Service: Orthopedics;  Laterality: Left;   TUBAL LIGATION     Patient Active Problem List   Diagnosis Date Noted   Palpitations 05/20/2023   Iron deficiency anemia secondary to inadequate dietary iron intake 05/20/2023   Essential hypertension 12/17/2022   Insomnia 12/17/2022   Subacromial bursitis of left shoulder joint 08/29/2020   Tendinopathy of left rotator cuff 08/29/2020   Morbid obesity with BMI of 45.0-49.9, adult (HCC) 08/09/2020   Superior glenoid labrum lesion of left shoulder 07/10/2020   Health maintenance examination 03/23/2019    Prediabetes 12/07/2018   Strain of back 07-Dec-2018   Death of family member 12-07-2018   Back pain 07/31/2018   Other fatigue 05/31/2018   Chronic low back pain 11/30/2017   Shifting sleep-work schedule 06/15/2017   Paradoxical insomnia 06/15/2017   Sleep related headaches 06/15/2017   Super obese 06/15/2017   Mood complaints in sleep disorder 06/15/2017   Plantar fasciitis of left foot 03/01/2014   Porokeratosis 03/01/2014   Pain in lower limb 03/01/2014    PCP: Arnette Felts, FNP  REFERRING PROVIDER: Juanda Chance, NP  REFERRING DIAG: Myofascial pain syndrome  THERAPY DIAG:  Cervicalgia  Muscle weakness (generalized)  Pain in thoracic spine  Abnormal posture  Rationale for Evaluation and Treatment: Rehabilitation  ONSET DATE: Chronic   SUBJECTIVE:  SUBJECTIVE STATEMENT: Neck is stiff but overall doing OK. Doing her HEP.   PERTINENT HISTORY:  Chronic neck and low back pain, Left shoulder surgery 2021  PAIN:  Are you having pain? Yes:  NPRS scale: 1/10 (6/10 at worst) Pain location: Neck, mid-upper back, shoulders Pain description: Tight, aching Aggravating factors: Pulling, tugging, lifting clients Relieving factors: Ice packs or heating pads, creams, Tylenol  PRECAUTIONS: None  PATIENT GOALS: Pain relief   OBJECTIVE:  PATIENT SURVEYS:  FOTO 52% functional status  08/10/2023: 68% (met goal)  POSTURE:   Rounded shoulder and forward head posture  PALPATION: Tender to palpation bilateral upper traps and periscapular musculature   CERVICAL ROM:   Active ROM A/PROM (deg) eval AROM 09/03/23  Flexion 40 50  Extension 30 60  Right lateral flexion  50  Left lateral flexion  55  Right rotation 55 WNL   Left rotation 40 WNL    (Blank rows = not  tested)  UPPER EXTREMITY ROM:   UE ROM grossly WFL, she did report left shoulder pain with all movements  UPPER EXTREMITY MMT:  MMT Right eval Left eval Rt / Lt 08/17/2023  Shoulder flexion 4 4 5  / 5  Shoulder extension 4+ 4 5 / 5  Shoulder abduction 4 4 5  / 5  Shoulder adduction     Shoulder internal rotation     Shoulder external rotation 4 5 5  / 5  Middle trapezius 4- 4-   Lower trapezius 3 3   Elbow flexion     Elbow extension     Wrist flexion     Wrist extension     Wrist ulnar deviation     Wrist radial deviation     Wrist pronation     Wrist supination     Grip strength      (Blank rows = not tested)  CERVICAL SPECIAL TESTS:  Radicular testing negative for cervical region  FUNCTIONAL TESTS:  Not assessed   TODAY'S TREATMENT:        [facility] Adult PT Treatment:                                                DATE: 09/03/23 Therapeutic Exercise: UBE L2 x 4 min (fwd/bwd) to improve shoulder endurance Sidelying Thoracic Lumbar Rotation  x  5  Seated Shoulder Horizontal Abduction with Resistance x 10 x 2 sets  Standing Row with Anchored Resistance   x 15  Shoulder extension x 15 green band  ER/IR blue band x 15   Alternating diagonals Stand/laying blue x  10 reps   [facility] Adult PT Treatment:                                                DATE: 08/28/23 Therapeutic Exercise: UBE L2 x 4 min (fwd/bwd) to improve shoulder endurance Sidelying thoracic rotation 10 x 10 sec each Seated horizontal abduction with green 2 x 15 Seated diagonals with green 2 x 10 each Seated upper trap stretch 2 x 15 sec each Row machine 25# 3 x 15 Extension with green 2 x 15 High row with blue 2 x 10 ER with blue 2 x 15 each   [facility] Adult PT Treatment:  DATE: 08/21/23 Therapeutic Exercise: UBE Level 2 2 min each way Seated Row 25# 15 x 2  Extension with red 2 x 10 Standing green band horiz abdct 10 x 2 Green Band ER bilat 10 x 2  Cat/  Camel  Qped UE raise x 10 Sidelying thoracic rotation 10 x 10 sec each Supine alternating diagoanls GTB x 10 Supine horiz abdct x 20 Supine shoulder ER bilat GTB x 20  [facility] Adult PT Treatment:                                                DATE: 08/17/2023 Therapeutic Exercise: UBE L1 x 4 min (fwd/bwd) while taking subjective Sidelying thoracic rotation 10 x 10 sec each Supine horizontal abduction with green 2 x 20 Seated double ER and scap retraction with green 2 x 20 Seated horizontal abduction with green 2 x 10  Row machine 20# 2 x 15 Extension with yellow 2 x 10   [facility] Adult PT Treatment:                                                DATE: 08/10/2023 Therapeutic Exercise: UBE L1 x 4 min (fwd/bwd) while taking subjective Sidelying thoracic rotation 10 x 10 sec each Cat cow x 10 Seated horizontal abduction with yellow 2 x 10  Row with green 2 x 10 Extension with yellow 2 x 10 ER with yellow 2 x 10  PATIENT EDUCATION:  Education details: HEP Person educated: Patient Education method: Programmer, multimedia, Demonstration, Actor cues, Verbal cues Education comprehension: verbalized understanding, returned demonstration, verbal cues required, tactile cues required, and needs further education  HOME EXERCISE PROGRAM: Access Code: [email or web address]    ASSESSMENT: CLINICAL IMPRESSION: Patient has met her goals and continues to perform her HEP without cues.  Today we reviewed her plan, encouarged a walking program in addition to cervical stretches and postural strengthening. FOTO score improved to above expected level. She is agreeable to DC at this time.   OBJECTIVE IMPAIRMENTS: decreased activity tolerance, decreased ROM, decreased strength, impaired flexibility, improper body mechanics, postural dysfunction, and pain.   ACTIVITY LIMITATIONS: carrying, lifting, bending, sleeping, hygiene/grooming, and caring for others  PARTICIPATION LIMITATIONS: meal prep, cleaning, driving, shopping,  community activity, and occupation  PERSONAL FACTORS: Fitness, Past/current experiences, Time since onset of injury/illness/exacerbation, and 1-2 comorbidities: see PMH above  are also affecting patient's functional outcome.    GOALS: Goals reviewed with patient? Yes  SHORT TERM GOALS: Target date: 08/18/2023  Patient will be I with initial HEP in order to progress with therapy. Baseline: HEP provided at eval 08/10/2023: progressing 08/28/2023: independent with initial HEP Goal status: MET  2.  Patient will report neck pain </= 8/10 with work or activity in order to reduce functional limitations Baseline: 10/10 pain 08/17/2023: 6/10 Goal status: MET  LONG TERM GOALS: Target date: 09/15/2023  Patient will be I with final HEP to maintain progress from PT. Baseline: HEP provided at eval Goal status: MET   2.  Patient will report >/= 61% status on FOTO to indicate improved functional ability. Baseline: 52% functional status 08/10/2023: 68% Goal status: MET  3.  Patient will demonstrate cervical rotation >/= 60 deg bilaterally in order to reduce neck  tension and improve driving ability Baseline: see limitations above Goal status: MET   4.  Patient will demonstrate periscapular strength >/= 4/5 MMT in order to improve postural control and reduce pain with work related tasks such as pulling  Baseline: see limitations above Goal status: MET    PLAN: PT FREQUENCY: 1-2x/week  PT DURATION: 8 weeks  PLANNED INTERVENTIONS: Therapeutic exercises, Therapeutic activity, Neuromuscular re-education, Balance training, Gait training, Patient/Family education, Self Care, Joint mobilization, Joint manipulation, Aquatic Therapy, Dry Needling, Electrical stimulation, Spinal manipulation, Spinal mobilization, Cryotherapy, Moist heat, Taping, Ionotophoresis 4mg /ml Dexamethasone, Manual therapy, and Re-evaluation  PLAN FOR NEXT SESSION: NA, DC from PT   Karie Mainland, PT 09/03/23 10:15 AM Phone:  [phone] Fax: [phone]

## 2023-09-03 ENCOUNTER — Ambulatory Visit: Payer: Medicaid Other | Admitting: Physical Therapy

## 2023-09-03 ENCOUNTER — Encounter: Payer: Self-pay | Admitting: Physical Therapy

## 2023-09-03 DIAGNOSIS — R293 Abnormal posture: Secondary | ICD-10-CM

## 2023-09-03 DIAGNOSIS — M546 Pain in thoracic spine: Secondary | ICD-10-CM

## 2023-09-03 DIAGNOSIS — M6281 Muscle weakness (generalized): Secondary | ICD-10-CM

## 2023-09-03 DIAGNOSIS — M542 Cervicalgia: Secondary | ICD-10-CM

## 2023-09-04 ENCOUNTER — Other Ambulatory Visit: Payer: Self-pay | Admitting: Podiatry

## 2023-09-04 ENCOUNTER — Other Ambulatory Visit: Payer: Self-pay | Admitting: Nurse Practitioner

## 2023-09-04 DIAGNOSIS — G4700 Insomnia, unspecified: Secondary | ICD-10-CM

## 2023-10-06 ENCOUNTER — Other Ambulatory Visit: Payer: Self-pay | Admitting: Nurse Practitioner

## 2023-10-30 ENCOUNTER — Encounter (HOSPITAL_COMMUNITY): Payer: Self-pay

## 2023-10-30 ENCOUNTER — Ambulatory Visit (HOSPITAL_COMMUNITY)
Admission: EM | Admit: 2023-10-30 | Discharge: 2023-10-30 | Disposition: A | Discharge status: Home / Self Care | Payer: 59 | Attending: Emergency Medicine | Admitting: Emergency Medicine

## 2023-10-30 DIAGNOSIS — B9689 Other specified bacterial agents as the cause of diseases classified elsewhere: Secondary | ICD-10-CM

## 2023-10-30 DIAGNOSIS — J019 Acute sinusitis, unspecified: Secondary | ICD-10-CM | POA: Diagnosis not present

## 2023-10-30 LAB — POCT FASTING CBG KUC MANUAL ENTRY: POCT Glucose (KUC): 101 mg/dL — AB (ref 70–99)

## 2023-10-30 MED ORDER — AMOXICILLIN-POT CLAVULANATE 875-125 MG PO TABS: 1.0000 | ORAL_TABLET | Freq: Two times a day (BID) | ORAL | 0 refills | Status: DC

## 2023-10-30 NOTE — ED Provider Notes (Signed)
[facility]    CSN: [id number] Arrival date & time: 10/30/23  0807      History   Chief Complaint Chief Complaint  Patient presents with   Dizziness    HPI Deborah Jacobson is a 42 y.o. female.   Patient presents to clinic complaining of an episode of dizziness this morning at work around 5 AM.  She does have a history of vertigo, reports this feels differently.  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  She went and sat down and her symptoms improved, she did not pass out or have any falls.  Has been having sinus pressure and congestion for the past 3 weeks.  Wearing her glasses and is uncomfortable due to the pressure.  Some posterior nasal drip.  She has been taking over-the-counter Mucinex without much improvement.  No fevers.  No cough.  Nasal drainage has a bad taste and smell.  The history is provided by the patient and medical records.  Dizziness   Past Medical History:  Diagnosis Date   Acid reflux    Diabetes in pregnancy    Hypertension    Seasonal allergies     Patient Active Problem List   Diagnosis Date Noted   Palpitations 05/20/2023   Iron deficiency anemia secondary to inadequate dietary iron intake 05/20/2023   Essential hypertension 12/17/2022   Insomnia 12/17/2022   Subacromial bursitis of left shoulder joint 08/29/2020   Tendinopathy of left rotator cuff 08/29/2020   Morbid obesity with BMI of 45.0-49.9, adult (HCC) 08/09/2020   Superior glenoid labrum lesion of left shoulder 07/10/2020   Health maintenance examination 03/23/2019   Prediabetes 12-27-18   Strain of back 2018-12-27   Death of family member 12-27-18   Back pain 07/31/2018   Other fatigue 05/31/2018   Chronic low back pain 11/30/2017   Shifting sleep-work schedule 06/15/2017   Paradoxical insomnia 06/15/2017   Sleep related headaches 06/15/2017   Super obese 06/15/2017   Mood complaints in sleep disorder 06/15/2017   Plantar fasciitis of  left foot 03/01/2014   Porokeratosis 03/01/2014   Pain in lower limb 03/01/2014    Past Surgical History:  Procedure Laterality Date   CESAREAN SECTION     SHOULDER ARTHROSCOPY WITH BICEPS TENDON REPAIR Left 08/29/2020   Procedure: LEFT SHOULDER ARTHROSCOPY WITH BICEPS TENODESIS;  Surgeon: Tarry Kos, MD;  Location: [facility];  Service: Orthopedics;  Laterality: Left;   TUBAL LIGATION      OB History     Gravida  6   Para  4   Term  4   Preterm      AB  2   Living  4      SAB  1   IAB  1   Ectopic      Multiple      Live Births  4            Home Medications    Prior to Admission medications   Medication Sig Start Date End Date Taking? Authorizing Provider  amoxicillin-clavulanate (AUGMENTIN) 875-125 MG tablet Take 1 tablet by mouth every 12 (twelve) hours. 10/30/23  Yes Rinaldo Ratel, [place] N, FNP  albuterol (VENTOLIN HFA) 108 (90 Base) MCG/ACT inhaler Inhale 2 puffs into the lungs every 6 (six) hours as needed for wheezing or shortness of breath. 03/13/22   Arnette Felts, FNP  cyclobenzaprine (FLEXERIL) 5 MG tablet Take 1 tablet (5 mg total) by mouth 2 (two) times  daily as needed for muscle spasms. 03/18/23   Cristie Hem, PA-C  diazepam (VALIUM) 5 MG tablet Take one tablet by mouth with food one hour prior to procedure. May repeat 30 minutes prior if needed. 05/11/23   Juanda Chance, NP  ferrous sulfate 325 (65 FE) MG EC tablet Take 1 tablet (325 mg total) by mouth 2 (two) times daily. Patient not taking: Reported on 05/20/2023 12/18/22 12/18/23  Arnette Felts, FNP  hydrochlorothiazide (HYDRODIURIL) 12.5 MG tablet TAKE 1 TABLET (12.5 MG) BY MOUTH DAILY 08/06/23   Arnette Felts, FNP  meloxicam (MOBIC) 15 MG tablet TAKE 1/2 TO 1 TABLET BY MOUTH DAILY AS NEEDED FOR PAIN 09/04/23   McCaughan, Dia D, DPM  methocarbamol (ROBAXIN-750) 750 MG tablet Take 1 tablet (750 mg total) by mouth 2 (two) times daily as needed for muscle spasms. 02/05/23   London Sheer, MD  ondansetron (ZOFRAN-ODT) 4 MG disintegrating tablet Take 1 tablet (4 mg total) by mouth every 8 (eight) hours as needed for nausea or vomiting. 04/11/23   Raspet, Noberto Retort, PA-C  potassium chloride (KLOR-CON) 10 MEQ tablet Take 1 tablet (10 mEq total) by mouth daily. 04/12/23   Raspet, Noberto Retort, PA-C  sucralfate (CARAFATE) 1 g tablet Take 1 tablet (1 g total) by mouth 4 (four) times daily -  with meals and at bedtime. 04/11/23   Raspet, Noberto Retort, PA-C  traMADol (ULTRAM) 50 MG tablet Take 1 tablet (50 mg total) by mouth every 12 (twelve) hours as needed. 06/01/23 05/31/24  Juanda Chance, NP  traZODone (DESYREL) 50 MG tablet TAKE 1 TABLET BY MOUTH EVERY DAY AT BEDTIME AS NEEDED FOR SLEEP 09/08/23   Arnette Felts, FNP  triamcinolone cream (KENALOG) 0.5 % Apply 1 Application topically 3 (three) times daily. 10/01/22   Arnette Felts, FNP  cetirizine (ZYRTEC) 10 MG tablet Take 10 mg by mouth daily.  10/13/19  [provider]    Family History Family History  Problem Relation Age of Onset   Diabetes Mother    Liver disease Mother    Hypertension Mother     Social History Social History   Tobacco Use   Smoking status: Former   Smokeless tobacco: Never  Vaping Use   Vaping status: Never Used  Substance Use Topics   Alcohol use: No   Drug use: No     Allergies   Latex   Review of Systems Review of Systems  Per HPI   Physical Exam Triage Vital Signs ED Triage Vitals  Encounter Vitals Group     BP 10/30/23 0859 123/84     Systolic BP Percentile --      Diastolic BP Percentile --      Pulse Rate 10/30/23 0859 80     Resp 10/30/23 0859 16     Temp 10/30/23 0859 98.3 F (36.8 C)     Temp Source 10/30/23 0859 Oral     SpO2 10/30/23 0859 98 %     Weight 10/30/23 0857 225 lb (102.1 kg)     Height 10/30/23 0857 4\' 9"  (1.448 m)     Head Circumference --      Peak Flow --      Pain Score 10/30/23 0857 0     Pain Loc --      Pain Education --      Exclude from  Growth Chart --    No data found.  Updated Vital Signs BP 123/84 (BP Location: Right Arm)  Pulse 80   Temp 98.3 F (36.8 C) (Oral)   Resp 16   Ht 4\' 9"  (1.448 m)   Wt 225 lb (102.1 kg)   LMP 10/13/2023 (Exact Date)   SpO2 98%   BMI 48.69 kg/m   Visual Acuity Right Eye Distance:   Left Eye Distance:   Bilateral Distance:    Right Eye Near:   Left Eye Near:    Bilateral Near:     Physical Exam Vitals and nursing note reviewed.  Constitutional:      Appearance: Normal appearance.  HENT:     Head: Normocephalic and atraumatic.     Right Ear: External ear normal.     Left Ear: External ear normal.     Nose: Congestion present.     Mouth/Throat:     Mouth: Mucous membranes are moist.  Eyes:     Conjunctiva/sclera: Conjunctivae normal.  Cardiovascular:     Rate and Rhythm: Normal rate and regular rhythm.     Heart sounds: Normal heart sounds. No murmur heard. Pulmonary:     Effort: Pulmonary effort is normal. No respiratory distress.  Musculoskeletal:        General: Normal range of motion.  Skin:    General: Skin is warm and dry.  Neurological:     General: No focal deficit present.     Mental Status: She is alert and oriented to person, place, and time.  Psychiatric:        Mood and Affect: Mood normal.        Behavior: Behavior normal.      UC Treatments / Results  Labs (all labs ordered are listed, but only abnormal results are displayed) Labs Reviewed  POCT FASTING CBG [facility] MANUAL ENTRY - Abnormal; Notable for the following components:      Result Value   POCT Glucose ([facility]) 101 (*)    All other components within normal limits    EKG   Radiology No results found.  Procedures Procedures (including critical care time)  Medications Ordered in UC Medications - No data to display  Initial Impression / Assessment and Plan / UC Course  I have reviewed the triage vital signs and the nursing notes.  Pertinent labs & imaging results that were  available during my care of the patient were reviewed by me and considered in my medical decision making (see chart for details).  Vitals and triage reviewed, patient is hemodynamically stable.  Heart with regular rate and rhythm.  CBG 101.  Congestion and sinus pressure on physical exam, pressure increases with bending forward.  And symptoms have been present for the past 3 weeks.  Will cover with Augmentin for bacterial sinusitis.  Suspect dizziness could have come from this, as does not feel like her previous episodes of vertigo.  Dizziness may have come from poor oral intake, has only been eating about once daily.  No current dizziness.  Given Augmentin for bacterial sinusitis, plan of care, follow-up care return precautions given, no questions at this time.     Final Clinical Impressions(s) / UC Diagnoses   Final diagnoses:  Acute bacterial sinusitis     Discharge Instructions      Please take the antibiotics as prescribed until finished, take them with food to help prevent gastrointestinal upset.  Continue take the Mucinex 1200 mg daily to help loosen secretions.  Ensure you are staying well-hydrated with at least 64 ounces water daily and eating balanced meals.  You can sleep with a  humidifier or do over-the-counter saline nasal spray to help additionally loosen secretions.  Follow-up with your primary care provider or return to clinic if no improvement in symptoms despite antibiotics.  Seek immediate care for any loss of consciousness at the nearest emergency department.     ED Prescriptions     Medication Sig Dispense Auth. Provider   amoxicillin-clavulanate (AUGMENTIN) 875-125 MG tablet Take 1 tablet by mouth every 12 (twelve) hours. 14 tablet Slate Debroux, [place], [place]      PDMP not reviewed this encounter.   Rinaldo Ratel [place], [place] 10/30/23 [phone]

## 2023-10-30 NOTE — Discharge Instructions (Addendum)
Please take the antibiotics as prescribed until finished, take them with food to help prevent gastrointestinal upset.  Continue take the Mucinex 1200 mg daily to help loosen secretions.  Ensure you are staying well-hydrated with at least 64 ounces water daily and eating balanced meals.  You can sleep with a humidifier or do over-the-counter saline nasal spray to help additionally loosen secretions.  Follow-up with your primary care provider or return to clinic if no improvement in symptoms despite antibiotics.  Seek immediate care for any loss of consciousness at the nearest emergency department.

## 2023-10-30 NOTE — ED Triage Notes (Signed)
Pt presents with an episode of dizziness this morning at work around 0500. Hx of vertigo. Pt states "this did not feel like my vertigo episode in the past. This was worse. I felt like I had to take a few minutes to get myself together while sitting down. I felt like I was going to hit that floor." Pt also voices nasal congestion x 3 weeks. OTC Mucinex taken with little improvement. Pt currently denies pain.

## 2023-11-09 ENCOUNTER — Other Ambulatory Visit: Payer: Self-pay | Admitting: Podiatry

## 2023-11-23 ENCOUNTER — Other Ambulatory Visit: Payer: Self-pay | Admitting: Nurse Practitioner

## 2023-11-23 ENCOUNTER — Encounter: Payer: Self-pay | Admitting: Nurse Practitioner

## 2023-11-23 ENCOUNTER — Ambulatory Visit (INDEPENDENT_AMBULATORY_CARE_PROVIDER_SITE_OTHER): Payer: 59 | Admitting: Nurse Practitioner

## 2023-11-23 VITALS — BP 100/80 | HR 82 | Temp 98.1°F | Ht <= 58 in | Wt 228.6 lb

## 2023-11-23 DIAGNOSIS — E66813 Obesity, class 3: Secondary | ICD-10-CM | POA: Insufficient documentation

## 2023-11-23 DIAGNOSIS — I10 Essential (primary) hypertension: Secondary | ICD-10-CM | POA: Diagnosis not present

## 2023-11-23 DIAGNOSIS — R7303 Prediabetes: Secondary | ICD-10-CM

## 2023-11-23 DIAGNOSIS — Z2821 Immunization not carried out because of patient refusal: Secondary | ICD-10-CM | POA: Insufficient documentation

## 2023-11-23 DIAGNOSIS — M79675 Pain in left toe(s): Secondary | ICD-10-CM | POA: Insufficient documentation

## 2023-11-23 DIAGNOSIS — Z6841 Body Mass Index (BMI) 40.0 and over, adult: Secondary | ICD-10-CM

## 2023-11-23 MED ORDER — WEGOVY 0.25 MG/0.5ML ~~LOC~~ SOAJ: 0.2500 mg | SUBCUTANEOUS | 0 refills | Status: DC

## 2023-11-23 NOTE — Assessment & Plan Note (Signed)

## 2023-11-23 NOTE — Patient Instructions (Addendum)
 Take tylenol on a more regular basis for the toe pain.  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

## 2023-11-23 NOTE — Assessment & Plan Note (Signed)
 Blood pressure is well controlled. Continue current medications.

## 2023-11-23 NOTE — Assessment & Plan Note (Signed)
 Hemoglobin A1c was slightly increased, will recheck levels today.  She has been focusing more on drinking more water and attempting to eat a healthy diet.

## 2023-11-23 NOTE — Progress Notes (Signed)
 LILLETTE Kristeen JINNY Gladis, CMA,acting as a neurosurgeon for Gaines Ada, FNP.,have documented all relevant documentation on the behalf of Gaines Ada, FNP,as directed by  Gaines Ada, FNP while in the presence of Gaines Ada, FNP.  Subjective:  Patient ID: Deborah Jacobson , female    DOB: 1981-02-17 , 43 y.o.   MRN: [id number]  Chief Complaint  Patient presents with   Hypertension    HPI  Patient presents today for a bp follow up, Patient reports compliance with medication. Patient denies any chest pain, SOB, or headaches. Patient would like to discuss weight loss options.   Wt Readings from Last 3 Encounters: 11/23/23 : 228 lb 9.6 oz (103.7 kg) 10/30/23 : 225 lb (102.1 kg) 05/20/23 : 218 lb 3.2 oz (99 kg)  She is walking 2 times a week. She is cutting back on her high sugar and carb foods .   Hypertension This is a chronic problem. The current episode started more than 1 year ago. The problem has been gradually improving since onset. The problem is controlled. Pertinent negatives include no chest pain or palpitations. Risk factors for coronary artery disease include obesity and sedentary lifestyle. Past treatments include diuretics. There are no compliance problems.  There is no history of angina.     Past Medical History:  Diagnosis Date   Acid reflux    Diabetes in pregnancy    Hypertension    Seasonal allergies      Family History  Problem Relation Age of Onset   Diabetes Mother    Liver disease Mother    Hypertension Mother      Current Outpatient Medications:    albuterol  (VENTOLIN  HFA) 108 (90 Base) MCG/ACT inhaler, Inhale 2 puffs into the lungs every 6 (six) hours as needed for wheezing or shortness of breath., Disp: 6.7 g, Rfl: 1   cyclobenzaprine  (FLEXERIL ) 5 MG tablet, Take 1 tablet (5 mg total) by mouth 2 (two) times daily as needed for muscle spasms., Disp: 20 tablet, Rfl: 0   diazepam  (VALIUM ) 5 MG tablet, Take one tablet by mouth with food one hour prior to procedure.  May repeat 30 minutes prior if needed., Disp: 2 tablet, Rfl: 0   hydrochlorothiazide  (HYDRODIURIL ) 12.5 MG tablet, TAKE 1 TABLET (12.5 MG) BY MOUTH DAILY, Disp: 90 tablet, Rfl: 2   meloxicam  (MOBIC ) 15 MG tablet, TAKE 1/2 TO 1 TABLET BY MOUTH DAILY AS NEEDED FOR PAIN, Disp: 30 tablet, Rfl: 1   methocarbamol  (ROBAXIN -750) 750 MG tablet, Take 1 tablet (750 mg total) by mouth 2 (two) times daily as needed for muscle spasms., Disp: 40 tablet, Rfl: 0   ondansetron  (ZOFRAN -ODT) 4 MG disintegrating tablet, Take 1 tablet (4 mg total) by mouth every 8 (eight) hours as needed for nausea or vomiting., Disp: 20 tablet, Rfl: 0   potassium chloride  (KLOR-CON ) 10 MEQ tablet, Take 1 tablet (10 mEq total) by mouth daily., Disp: 7 tablet, Rfl: 0   Semaglutide -Weight Management (WEGOVY ) 0.25 MG/0.5ML SOAJ, Inject 0.25 mg into the skin once a week., Disp: 2 mL, Rfl: 0   sucralfate  (CARAFATE ) 1 g tablet, Take 1 tablet (1 g total) by mouth 4 (four) times daily -  with meals and at bedtime., Disp: 28 tablet, Rfl: 0   traMADol  (ULTRAM ) 50 MG tablet, Take 1 tablet (50 mg total) by mouth every 12 (twelve) hours as needed., Disp: 20 tablet, Rfl: 0   traZODone  (DESYREL ) 50 MG tablet, TAKE 1 TABLET BY MOUTH EVERY DAY AT BEDTIME AS NEEDED FOR  SLEEP, Disp: 30 tablet, Rfl: 2   triamcinolone  cream (KENALOG ) 0.5 %, Apply 1 Application topically 3 (three) times daily., Disp: 30 g, Rfl: 1   ferrous sulfate  325 (65 FE) MG EC tablet, Take 1 tablet (325 mg total) by mouth 2 (two) times daily. (Patient not taking: Reported on 11/23/2023), Disp: 60 tablet, Rfl: 3   Allergies  Allergen Reactions   Latex Hives    Powder from gloves     Review of Systems  Constitutional: Negative.   Respiratory: Negative.  Negative for cough.   Cardiovascular: Negative.  Negative for chest pain, palpitations and leg swelling.  Genitourinary: Negative.   Musculoskeletal:  Negative for myalgias.  Neurological:  Negative for dizziness.   Psychiatric/Behavioral: Negative.       Today's Vitals   11/23/23 0826  BP: 100/80  Pulse: 82  Temp: 98.1 F (36.7 C)  TempSrc: Oral  Weight: 228 lb 9.6 oz (103.7 kg)  Height: 4' 9 (1.448 m)  PainSc: 0-No pain   Body mass index is 49.47 kg/m.  Wt Readings from Last 3 Encounters:  11/23/23 228 lb 9.6 oz (103.7 kg)  10/30/23 225 lb (102.1 kg)  05/20/23 218 lb 3.2 oz (99 kg)     Objective:  Physical Exam Vitals reviewed.  Constitutional:      General: She is not in acute distress.    Appearance: Normal appearance. She is well-developed. She is obese.  Cardiovascular:     Rate and Rhythm: Normal rate and regular rhythm.     Pulses: Normal pulses.     Heart sounds: Normal heart sounds. No murmur heard. Pulmonary:     Effort: Pulmonary effort is normal. No respiratory distress.     Breath sounds: Normal breath sounds. No wheezing.  Chest:     Chest wall: No tenderness.  Musculoskeletal:        General: No swelling or tenderness. Normal range of motion.     Comments: She does have an area to the left great toe medial.   Skin:    General: Skin is warm and dry.     Capillary Refill: Capillary refill takes less than 2 seconds.  Neurological:     General: No focal deficit present.     Mental Status: She is alert and oriented to person, place, and time.     Cranial Nerves: No cranial nerve deficit.     Motor: No weakness.  Psychiatric:        Mood and Affect: Mood normal.        Behavior: Behavior normal.        Thought Content: Thought content normal.        Judgment: Judgment normal.         Assessment And Plan:  Essential hypertension Assessment & Plan: Blood pressure is well-controlled.  Continue current medications.  Orders: -     BMP8+eGFR -     Microalbumin / creatinine urine ratio  Prediabetes Assessment & Plan: Hemoglobin A1c was slightly increased, will recheck levels today.  She has been focusing more on drinking more water and attempting to eat a  healthy diet.    Orders: -     Hemoglobin A1c  Pain of left great toe Assessment & Plan: She does have tenderness to her left medial great toe and a slightly raised area that could possibly be a bunion.  I will check for uric acid and sed rate.  Encouraged to take Tylenol  on a more regular basis to help manage the  pain she is also to follow-up with her podiatrist for further evaluation  Orders: -     Uric acid -     Sedimentation rate  Influenza vaccination declined Assessment & Plan: Patient declined influenza vaccination at this time. Patient is aware that influenza vaccine prevents illness in 70% of healthy people, and reduces hospitalizations to 30-70% in elderly. This vaccine is recommended annually. Education has been provided regarding the importance of this vaccine but patient still declined. Advised may receive this vaccine at local pharmacy or Health Dept.or vaccine clinic. Aware to provide a copy of the vaccination record if obtained from local pharmacy or Health Dept.  Pt is willing to accept risk associated with refusing vaccination.    Class 3 severe obesity due to excess calories without serious comorbidity with body mass index (BMI) of 40.0 to 44.9 in adult [facility]) Assessment & Plan: She is encouraged to strive for BMI less than 30 to decrease cardiac risk. Advised to aim for at least 150 minutes of exercise per week. Will see if she can get approved for Wegovy .  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  She is familiar with taking GLP-1's and is reminded to make sure she is having regular bowel movements, if has stomach pain or difficulty swallowing to notify office  Orders: -     Wegovy ; Inject 0.25 mg into the skin once a week.  Dispense: 2 mL; Refill: 0   Return for 6 month bp check.   Patient was given opportunity to ask questions. Patient verbalized understanding of the plan and was able to repeat key  elements of the plan. All questions were answered to their satisfaction.    LILLETTE Gaines Ada, FNP, have reviewed all documentation for this visit. The documentation on 11/23/23 for the exam, diagnosis, procedures, and orders are all accurate and complete.   IF YOU HAVE BEEN REFERRED TO A SPECIALIST, IT MAY TAKE 1-2 WEEKS TO SCHEDULE/PROCESS THE REFERRAL. IF YOU HAVE NOT HEARD FROM US /SPECIALIST IN TWO WEEKS, PLEASE GIVE US  A CALL AT [phone].

## 2023-11-23 NOTE — Assessment & Plan Note (Signed)
 She does have tenderness to her left medial great toe and a slightly raised area that could possibly be a bunion.  I will check for uric acid and sed rate.  Encouraged to take Tylenol  on a more regular basis to help manage the pain she is also to follow-up with her podiatrist for further evaluation

## 2023-11-23 NOTE — Assessment & Plan Note (Signed)
 She is encouraged to strive for BMI less than 30 to decrease cardiac risk. Advised to aim for at least 150 minutes of exercise per week. Will see if she can get approved for Wegovy .  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  She is familiar with taking GLP-1's and is reminded to make sure she is having regular bowel movements, if has stomach pain or difficulty swallowing to notify office

## 2023-11-24 LAB — BMP8+EGFR
BUN/Creatinine Ratio: 16 (ref 9–23)
BUN: 12 mg/dL (ref 6–24)
CO2: 20 mmol/L (ref 20–29)
Calcium: 9.3 mg/dL (ref 8.7–10.2)
Chloride: 100 mmol/L (ref 96–106)
Creatinine, Ser: 0.74 mg/dL (ref 0.57–1.00)
Glucose: 92 mg/dL (ref 70–99)
Potassium: 3.7 mmol/L (ref 3.5–5.2)
Sodium: 138 mmol/L (ref 134–144)
eGFR: 103 mL/min/{1.73_m2} (ref >59)

## 2023-11-24 LAB — MICROALBUMIN / CREATININE URINE RATIO
Creatinine, Urine: 51.6 mg/dL
Microalb/Creat Ratio: 23 mg/g{creat} (ref 0–29)
Microalbumin, Urine: 11.9 ug/mL

## 2023-11-24 LAB — HEMOGLOBIN A1C
Est. average glucose Bld gHb Est-mCnc: 126 mg/dL
Hgb A1c MFr Bld: 6 % — ABNORMAL HIGH (ref 4.8–5.6)

## 2023-11-24 LAB — SEDIMENTATION RATE: Sed Rate: 17 mm/h (ref 0–32)

## 2023-11-24 LAB — URIC ACID: Uric Acid: 4.9 mg/dL (ref 2.6–6.2)

## 2023-12-05 ENCOUNTER — Encounter (HOSPITAL_COMMUNITY): Payer: Self-pay | Admitting: Emergency Medicine

## 2023-12-05 ENCOUNTER — Ambulatory Visit (HOSPITAL_COMMUNITY)
Admission: EM | Admit: 2023-12-05 | Discharge: 2023-12-05 | Disposition: A | Discharge status: Home / Self Care | Payer: 59 | Attending: Family Medicine | Admitting: Family Medicine

## 2023-12-05 DIAGNOSIS — M5442 Lumbago with sciatica, left side: Secondary | ICD-10-CM

## 2023-12-05 MED ORDER — KETOROLAC TROMETHAMINE 30 MG/ML IJ SOLN
30.0000 mg | Freq: Once | INTRAMUSCULAR | Status: AC
  Administered 2023-12-05: 30 mg via INTRAMUSCULAR

## 2023-12-05 MED ORDER — KETOROLAC TROMETHAMINE 30 MG/ML IJ SOLN
INTRAMUSCULAR | Status: AC
Start: 2023-12-05 — End: ?
  Filled 2023-12-05: qty 1

## 2023-12-05 MED ORDER — TIZANIDINE HCL 4 MG PO TABS: 4.0000 mg | ORAL_TABLET | Freq: Three times a day (TID) | ORAL | 0 refills | Status: DC | PRN

## 2023-12-05 MED ORDER — KETOROLAC TROMETHAMINE 10 MG PO TABS
10.0000 mg | ORAL_TABLET | Freq: Four times a day (QID) | ORAL | 0 refills | Status: DC | PRN
Start: 2023-12-05 — End: 2024-02-17

## 2023-12-05 NOTE — Discharge Instructions (Signed)
You have been given a shot of Toradol 30 mg today.  Ketorolac 10 mg tablets--take 1 tablet every 6 hours as needed for pain.  This is the same medicine that is in the shot we just gave you   Take tizanidine 4 mg--1 every 8 hours as needed for muscle spasms; this medication can cause dizziness and sleepiness   Please follow-up with your primary care

## 2023-12-05 NOTE — ED Triage Notes (Signed)
Pt c/o sciatica pain that got worse today while at work. Reports fell down some stairs last week. Pt reports pain is radiating down left leg. hasn't taken anything for pain today.

## 2023-12-05 NOTE — ED Provider Notes (Signed)
[facility]    CSN: [id number] Arrival date & time: 12/05/23  1600      History   Chief Complaint Chief Complaint  Patient presents with   Back Pain    HPI Deborah Jacobson is a 43 y.o. female.    Back Pain Here for left low back pain.  She has had a history of sciatica and it got worse after she fell on some stairs last week.  He had a little extra pain right after the fall, but then the last 2 days she has had a lot more pain in her low back across her waist and it radiates down into her left leg.  No new bowel or bladder incontinence. No fever or dysuria or hematuria  NKDA  She has not take anything at home.  She is having some numbness and tingling in her toes  Last menstrual cycle was in the last 2 weeks   Past Medical History:  Diagnosis Date   Acid reflux    Diabetes in pregnancy    Hypertension    Seasonal allergies     Patient Active Problem List   Diagnosis Date Noted   Influenza vaccination declined 11/23/2023   Class 3 severe obesity due to excess calories without serious comorbidity with body mass index (BMI) of 40.0 to 44.9 in adult (HCC) 11/23/2023   Pain of left great toe 11/23/2023   Palpitations 05/20/2023   Iron deficiency anemia secondary to inadequate dietary iron intake 05/20/2023   Essential hypertension 12/17/2022   Insomnia 12/17/2022   Subacromial bursitis of left shoulder joint 08/29/2020   Tendinopathy of left rotator cuff 08/29/2020   Morbid obesity with BMI of 45.0-49.9, adult (HCC) 08/09/2020   Superior glenoid labrum lesion of left shoulder 07/10/2020   Health maintenance examination 03/23/2019   Prediabetes Jan 01, 2019   Strain of back 2019-01-01   Death of family member 2019-01-01   Back pain 07/31/2018   Other fatigue 05/31/2018   Chronic low back pain 11/30/2017   Shifting sleep-work schedule 06/15/2017   Paradoxical insomnia 06/15/2017   Sleep related headaches 06/15/2017   Super obese 06/15/2017   Mood  complaints in sleep disorder 06/15/2017   Plantar fasciitis of left foot 03/01/2014   Porokeratosis 03/01/2014   Pain in lower limb 03/01/2014    Past Surgical History:  Procedure Laterality Date   CESAREAN SECTION     SHOULDER ARTHROSCOPY WITH BICEPS TENDON REPAIR Left 08/29/2020   Procedure: LEFT SHOULDER ARTHROSCOPY WITH BICEPS TENODESIS;  Surgeon: Tarry Kos, MD;  Location: [facility];  Service: Orthopedics;  Laterality: Left;   TUBAL LIGATION      OB History     Gravida  6   Para  4   Term  4   Preterm      AB  2   Living  4      SAB  1   IAB  1   Ectopic      Multiple      Live Births  4            Home Medications    Prior to Admission medications   Medication Sig Start Date End Date Taking? Authorizing Provider  ketorolac (TORADOL) 10 MG tablet Take 1 tablet (10 mg total) by mouth every 6 (six) hours as needed (pain). 12/05/23  Yes Zenia Resides, MD  tiZANidine (ZANAFLEX) 4 MG tablet Take 1 tablet (4 mg total) by mouth every 8 (eight) hours as  needed for muscle spasms. 12/05/23  Yes Zenia Resides, MD  albuterol (VENTOLIN HFA) 108 (90 Base) MCG/ACT inhaler Inhale 2 puffs into the lungs every 6 (six) hours as needed for wheezing or shortness of breath. 03/13/22   Arnette Felts, FNP  diazepam (VALIUM) 5 MG tablet Take one tablet by mouth with food one hour prior to procedure. May repeat 30 minutes prior if needed. 05/11/23   Juanda Chance, NP  ferrous sulfate 325 (65 FE) MG EC tablet Take 1 tablet (325 mg total) by mouth 2 (two) times daily. Patient not taking: Reported on 11/23/2023 12/18/22 12/18/23  Arnette Felts, FNP  hydrochlorothiazide (HYDRODIURIL) 12.5 MG tablet TAKE 1 TABLET (12.5 MG) BY MOUTH DAILY 08/06/23   Arnette Felts, FNP  potassium chloride (KLOR-CON) 10 MEQ tablet Take 1 tablet (10 mEq total) by mouth daily. 04/12/23   Raspet, Noberto Retort, PA-C  Semaglutide-Weight Management (WEGOVY) 0.25 MG/0.5ML SOAJ Inject 0.25 mg into  the skin once a week. 11/23/23   Arnette Felts, FNP  sucralfate (CARAFATE) 1 g tablet Take 1 tablet (1 g total) by mouth 4 (four) times daily -  with meals and at bedtime. 04/11/23   Raspet, Noberto Retort, PA-C  traZODone (DESYREL) 50 MG tablet TAKE 1 TABLET BY MOUTH EVERY DAY AT BEDTIME AS NEEDED FOR SLEEP 09/08/23   Arnette Felts, FNP  triamcinolone cream (KENALOG) 0.5 % Apply 1 Application topically 3 (three) times daily. 10/01/22   Arnette Felts, FNP  cetirizine (ZYRTEC) 10 MG tablet Take 10 mg by mouth daily. Patient not taking: Reported on 11/23/2023  10/13/19  [provider]    Family History Family History  Problem Relation Age of Onset   Diabetes Mother    Liver disease Mother    Hypertension Mother     Social History Social History   Tobacco Use   Smoking status: Former   Smokeless tobacco: Never  Advertising account planner   Vaping status: Never Used  Substance Use Topics   Alcohol use: No   Drug use: No     Allergies   Latex   Review of Systems Review of Systems  Musculoskeletal:  Positive for back pain.     Physical Exam Triage Vital Signs ED Triage Vitals  Encounter Vitals Group     BP 12/05/23 1743 121/86     Systolic BP Percentile --      Diastolic BP Percentile --      Pulse Rate 12/05/23 1743 93     Resp 12/05/23 1743 17     Temp 12/05/23 1743 98.2 F (36.8 C)     Temp Source 12/05/23 1743 Oral     SpO2 12/05/23 1743 97 %     Weight --      Height --      Head Circumference --      Peak Flow --      Pain Score 12/05/23 1742 8     Pain Loc --      Pain Education --      Exclude from Growth Chart --    No data found.  Updated Vital Signs BP 121/86 (BP Location: Left Arm)   Pulse 93   Temp 98.2 F (36.8 C) (Oral)   Resp 17   SpO2 97%   Visual Acuity Right Eye Distance:   Left Eye Distance:   Bilateral Distance:    Right Eye Near:   Left Eye Near:    Bilateral Near:     Physical Exam Vitals reviewed.  Constitutional:      General: She is  not in acute distress.    Appearance: She is not ill-appearing, toxic-appearing or diaphoretic.  HENT:     Mouth/Throat:     Mouth: Mucous membranes are moist.  Cardiovascular:     Rate and Rhythm: Normal rate and regular rhythm.  Pulmonary:     Effort: Pulmonary effort is normal.     Breath sounds: Normal breath sounds.  Musculoskeletal:     Cervical back: Neck supple.     Comments: There is some tenderness along the lumbosacral area.  Straight leg raise causes some pain to radiate into her left calf.  Lymphadenopathy:     Cervical: No cervical adenopathy.  Skin:    Coloration: Skin is not jaundiced or pale.  Neurological:     General: No focal deficit present.     Mental Status: She is alert and oriented to person, place, and time.     Motor: No weakness.      UC Treatments / Results  Labs (all labs ordered are listed, but only abnormal results are displayed) Labs Reviewed - No data to display  EKG   Radiology No results found.  Procedures Procedures (including critical care time)  Medications Ordered in UC Medications  ketorolac (TORADOL) 30 MG/ML injection 30 mg (has no administration in time range)    Initial Impression / Assessment and Plan / UC Course  I have reviewed the triage vital signs and the nursing notes.  Pertinent labs & imaging results that were available during my care of the patient were reviewed by me and considered in my medical decision making (see chart for details).     Last renal function was normal.  Toradol was given here and injection and Toradol tablets are sent to the pharmacy along with tizanidine.  She will follow-up with her primary care.  Work note provided Final Clinical Impressions(s) / UC Diagnoses   Final diagnoses:  Acute bilateral low back pain with left-sided sciatica     Discharge Instructions      You have been given a shot of Toradol 30 mg today.  Ketorolac 10 mg tablets--take 1 tablet every 6 hours as  needed for pain.  This is the same medicine that is in the shot we just gave you   Take tizanidine 4 mg--1 every 8 hours as needed for muscle spasms; this medication can cause dizziness and sleepiness  Please follow-up with your primary care     ED Prescriptions     Medication Sig Dispense Auth. Provider   ketorolac (TORADOL) 10 MG tablet Take 1 tablet (10 mg total) by mouth every 6 (six) hours as needed (pain). 20 tablet Desirey Keahey, Janace Aris, MD   tiZANidine (ZANAFLEX) 4 MG tablet Take 1 tablet (4 mg total) by mouth every 8 (eight) hours as needed for muscle spasms. 15 tablet Kandise Riehle, Janace Aris, MD      I have reviewed the PDMP during this encounter.   Zenia Resides, MD 12/05/23 Windy Fast

## 2023-12-07 ENCOUNTER — Other Ambulatory Visit: Payer: Self-pay | Admitting: Nurse Practitioner

## 2023-12-07 DIAGNOSIS — G4700 Insomnia, unspecified: Secondary | ICD-10-CM

## 2023-12-15 ENCOUNTER — Emergency Department (HOSPITAL_COMMUNITY): Payer: 59

## 2023-12-15 ENCOUNTER — Encounter (HOSPITAL_COMMUNITY): Payer: Self-pay

## 2023-12-15 ENCOUNTER — Other Ambulatory Visit: Payer: Self-pay

## 2023-12-15 ENCOUNTER — Encounter: Payer: Self-pay | Admitting: Nurse Practitioner

## 2023-12-15 ENCOUNTER — Emergency Department (HOSPITAL_COMMUNITY)
Admission: EM | Admit: 2023-12-15 | Discharge: 2023-12-15 | Disposition: A | Discharge status: Home / Self Care | Payer: 59 | Attending: Emergency Medicine | Admitting: Emergency Medicine

## 2023-12-15 DIAGNOSIS — Z20822 Contact with and (suspected) exposure to covid-19: Secondary | ICD-10-CM | POA: Insufficient documentation

## 2023-12-15 DIAGNOSIS — S060XAA Concussion with loss of consciousness status unknown, initial encounter: Secondary | ICD-10-CM | POA: Insufficient documentation

## 2023-12-15 DIAGNOSIS — J329 Chronic sinusitis, unspecified: Secondary | ICD-10-CM | POA: Diagnosis not present

## 2023-12-15 DIAGNOSIS — I10 Essential (primary) hypertension: Secondary | ICD-10-CM | POA: Insufficient documentation

## 2023-12-15 DIAGNOSIS — W19XXXA Unspecified fall, initial encounter: Secondary | ICD-10-CM | POA: Insufficient documentation

## 2023-12-15 DIAGNOSIS — S060X0A Concussion without loss of consciousness, initial encounter: Secondary | ICD-10-CM | POA: Diagnosis not present

## 2023-12-15 DIAGNOSIS — S199XXA Unspecified injury of neck, initial encounter: Secondary | ICD-10-CM | POA: Diagnosis not present

## 2023-12-15 DIAGNOSIS — S0990XA Unspecified injury of head, initial encounter: Secondary | ICD-10-CM | POA: Diagnosis not present

## 2023-12-15 DIAGNOSIS — S0093XA Contusion of unspecified part of head, initial encounter: Secondary | ICD-10-CM | POA: Diagnosis not present

## 2023-12-15 LAB — RESP PANEL BY RT-PCR (RSV, FLU A&B, COVID)  RVPGX2
Influenza A by PCR: NEGATIVE
Influenza B by PCR: NEGATIVE
Resp Syncytial Virus by PCR: NEGATIVE
SARS Coronavirus 2 by RT PCR: NEGATIVE

## 2023-12-15 MED ORDER — KETOROLAC TROMETHAMINE 15 MG/ML IJ SOLN
15.0000 mg | Freq: Once | INTRAMUSCULAR | Status: AC
Start: 2023-12-15 — End: 2023-12-15
  Administered 2023-12-15: 15 mg via INTRAMUSCULAR

## 2023-12-15 MED ORDER — ACETAMINOPHEN 325 MG PO TABS
650.0000 mg | ORAL_TABLET | Freq: Once | ORAL | Status: AC
Start: 2023-12-15 — End: 2023-12-15
  Administered 2023-12-15: 650 mg via ORAL
  Filled 2023-12-15: qty 2

## 2023-12-15 MED ORDER — KETOROLAC TROMETHAMINE 15 MG/ML IJ SOLN
15.0000 mg | Freq: Once | INTRAMUSCULAR | Status: DC
  Filled 2023-12-15: qty 1

## 2023-12-15 NOTE — ED Provider Notes (Signed)
 [place] EMERGENCY DEPARTMENT AT [facility] Provider Note  CSN: [id number] Arrival date & time: 12/15/23 9088  Chief Complaint(s) Fall  HPI Deborah SARRAH Jacobson is a 43 y.o. female without significant past medical history presenting to the emergency department with headache.  Patient reports headache after falling and hitting her head 3 days ago.  She denies loss of consciousness.  Does not take blood thinners.  Reports headache, lightheadedness and dizziness, sensitive to light, nausea.  Denies any vomiting.  No fevers or chills.  No chest pain, back pain, abdominal pain.  No numbness or tingling.  No focal weakness.   Past Medical History Past Medical History:  Diagnosis Date   Acid reflux    Diabetes in pregnancy    Hypertension    Seasonal allergies    Patient Active Problem List   Diagnosis Date Noted   Influenza vaccination declined 11/23/2023   Class 3 severe obesity due to excess calories without serious comorbidity with body mass index (BMI) of 40.0 to 44.9 in adult (HCC) 11/23/2023   Pain of left great toe 11/23/2023   Palpitations 05/20/2023   Iron deficiency anemia secondary to inadequate dietary iron intake 05/20/2023   Essential hypertension 12/17/2022   Insomnia 12/17/2022   Subacromial bursitis of left shoulder joint 08/29/2020   Tendinopathy of left rotator cuff 08/29/2020   Morbid obesity with BMI of 45.0-49.9, adult (HCC) 08/09/2020   Superior glenoid labrum lesion of left shoulder 07/10/2020   Health maintenance examination 03/23/2019   Prediabetes 12/31/2018   Strain of back 12-31-18   Death of family member 12-31-18   Back pain 07/31/2018   Other fatigue 05/31/2018   Chronic low back pain 11/30/2017   Shifting sleep-work schedule 06/15/2017   Paradoxical insomnia 06/15/2017   Sleep related headaches 06/15/2017   Super obese 06/15/2017   Mood complaints in sleep disorder 06/15/2017   Plantar fasciitis of left foot 03/01/2014    Porokeratosis 03/01/2014   Pain in lower limb 03/01/2014   Home Medication(s) Prior to Admission medications   Medication Sig Start Date End Date Taking? Authorizing Provider  albuterol  (VENTOLIN  HFA) 108 (90 Base) MCG/ACT inhaler Inhale 2 puffs into the lungs every 6 (six) hours as needed for wheezing or shortness of breath. 03/13/22   Georgina Speaks, FNP  diazepam  (VALIUM ) 5 MG tablet Take one tablet by mouth with food one hour prior to procedure. May repeat 30 minutes prior if needed. 05/11/23   Williams, Megan E, NP  ferrous sulfate  325 (65 FE) MG EC tablet Take 1 tablet (325 mg total) by mouth 2 (two) times daily. Patient not taking: Reported on 11/23/2023 12/18/22 12/18/23  Moore, Janece, FNP  hydrochlorothiazide  (HYDRODIURIL ) 12.5 MG tablet TAKE 1 TABLET (12.5 MG) BY MOUTH DAILY 08/06/23   Moore, Janece, FNP  ketorolac  (TORADOL ) 10 MG tablet Take 1 tablet (10 mg total) by mouth every 6 (six) hours as needed (pain). 12/05/23   Vonna Sharlet POUR, MD  potassium chloride  (KLOR-CON ) 10 MEQ tablet Take 1 tablet (10 mEq total) by mouth daily. 04/12/23   Raspet, Erin K, PA-C  Semaglutide -Weight Management (WEGOVY ) 0.25 MG/0.5ML SOAJ Inject 0.25 mg into the skin once a week. 11/23/23   Moore, Janece, FNP  sucralfate  (CARAFATE ) 1 g tablet Take 1 tablet (1 g total) by mouth 4 (four) times daily -  with meals and at bedtime. 04/11/23   Raspet, Erin K, PA-C  tiZANidine  (ZANAFLEX ) 4 MG tablet Take 1 tablet (4 mg total) by mouth every 8 (eight)  hours as needed for muscle spasms. 12/05/23   Vonna Sharlet POUR, MD  traZODone  (DESYREL ) 50 MG tablet TAKE 1 TABLET BY MOUTH EVERY DAY AT BEDTIME AS NEEDED FOR SLEEP 12/07/23   Moore, Janece, FNP  triamcinolone  cream (KENALOG ) 0.5 % Apply 1 Application topically 3 (three) times daily. 10/01/22   Georgina Speaks, FNP  cetirizine (ZYRTEC) 10 MG tablet Take 10 mg by mouth daily. Patient not taking: Reported on 11/23/2023  10/13/19  [provider]                                                                                                                                     Past Surgical History Past Surgical History:  Procedure Laterality Date   CESAREAN SECTION     SHOULDER ARTHROSCOPY WITH BICEPS TENDON REPAIR Left 08/29/2020   Procedure: LEFT SHOULDER ARTHROSCOPY WITH BICEPS TENODESIS;  Surgeon: Jerri Kay HERO, MD;  Location: [facility];  Service: Orthopedics;  Laterality: Left;   TUBAL LIGATION     Family History Family History  Problem Relation Age of Onset   Diabetes Mother    Liver disease Mother    Hypertension Mother     Social History Social History   Tobacco Use   Smoking status: Former   Smokeless tobacco: Never  [employer]vertising [employer]   Vaping status: Never Used  Substance Use Topics   Alcohol use: No   Drug use: No   Allergies Latex  Review of Systems Review of Systems  All other systems reviewed and are negative.   Physical Exam Vital Signs  I have reviewed the triage vital signs BP 121/74   Pulse 65   Temp 98.4 F (36.9 C) (Oral)   Resp 16   Ht 4' 9 (1.448 m)   Wt 103.7 kg   SpO2 98%   BMI 49.47 kg/m  Physical Exam Vitals and nursing note reviewed.  Constitutional:      General: She is not in acute distress.    Appearance: She is well-developed.  HENT:     Head: Normocephalic.     Comments: Very small red spot to the back of the scalp    Mouth/Throat:     Mouth: Mucous membranes are moist.  Eyes:     Pupils: Pupils are equal, round, and reactive to light.  Cardiovascular:     Rate and Rhythm: Normal rate and regular rhythm.     Heart sounds: No murmur heard. Pulmonary:     Effort: Pulmonary effort is normal. No respiratory distress.     Breath sounds: Normal breath sounds.  Abdominal:     General: Abdomen is flat.     Palpations: Abdomen is soft.     Tenderness: There is no abdominal tenderness.  Musculoskeletal:        General: No tenderness.     Right lower leg: No edema.     Left lower leg:  No  edema.  Skin:    General: Skin is warm and dry.  Neurological:     General: No focal deficit present.     Mental Status: She is alert. Mental status is at baseline.     Comments: Cranial nerves II through XII intact, strength 5 out of 5 in the bilateral upper and lower extremities, no sensory deficit to light touch, no dysmetria on finger-nose-finger testing, ambulatory with steady gait.  Psychiatric:        Mood and Affect: Mood normal.        Behavior: Behavior normal.     ED Results and Treatments Labs (all labs ordered are listed, but only abnormal results are displayed) Labs Reviewed  RESP PANEL BY RT-PCR (RSV, FLU A&B, COVID)  RVPGX2                                                                                                                          Radiology CT Head Wo Contrast Result Date: 12/15/2023 CLINICAL DATA:  Head trauma, skull fracture or hematoma (Age 77-64y); Neck trauma, uncomplicated (NEXUS/CCR neg) (Age 67-64y) EXAM: CT HEAD WITHOUT CONTRAST CT CERVICAL SPINE WITHOUT CONTRAST TECHNIQUE: Multidetector CT imaging of the head and cervical spine was performed following the standard protocol without intravenous contrast. Multiplanar CT image reconstructions of the cervical spine were also generated. RADIATION DOSE REDUCTION: This exam was performed according to the departmental dose-optimization program which includes automated exposure control, adjustment of the mA and/or kV according to patient size and/or use of iterative reconstruction technique. COMPARISON:  None Available. FINDINGS: CT HEAD FINDINGS Brain: No evidence of acute infarction, hemorrhage, hydrocephalus, extra-axial collection or mass lesion/mass effect. Vascular: No hyperdense vessel or unexpected calcification. Skull: Normal. Negative for fracture or focal lesion. Sinuses/Orbits: No middle ear or mastoid effusion. Right OMC pattern paranasal sinus disease. Orbits are unremarkable. Other: None. CT CERVICAL  SPINE FINDINGS Alignment: Straightening of the normal cervical lordosis. Skull base and vertebrae: No acute fracture. No primary bone lesion or focal pathologic process. Soft tissues and spinal canal: No prevertebral fluid or swelling. No visible canal hematoma. Disc levels:  No CT evidence of high-grade spinal canal stenosis Upper chest: Negative. Other: None IMPRESSION: 1. No acute intracranial abnormality. 2. No acute fracture or traumatic subluxation of the cervical spine. Electronically Signed   By: Lyndall Gore M.D.   On: 12/15/2023 11:22   CT Cervical Spine Wo Contrast Result Date: 12/15/2023 CLINICAL DATA:  Head trauma, skull fracture or hematoma (Age 73-64y); Neck trauma, uncomplicated (NEXUS/CCR neg) (Age 55-64y) EXAM: CT HEAD WITHOUT CONTRAST CT CERVICAL SPINE WITHOUT CONTRAST TECHNIQUE: Multidetector CT imaging of the head and cervical spine was performed following the standard protocol without intravenous contrast. Multiplanar CT image reconstructions of the cervical spine were also generated. RADIATION DOSE REDUCTION: This exam was performed according to the departmental dose-optimization program which includes automated exposure control, adjustment of the mA and/or kV according to patient size and/or use of iterative reconstruction technique. COMPARISON:  None Available. FINDINGS: CT HEAD FINDINGS Brain: No evidence of acute infarction, hemorrhage, hydrocephalus, extra-axial collection or mass lesion/mass effect. Vascular: No hyperdense vessel or unexpected calcification. Skull: Normal. Negative for fracture or focal lesion. Sinuses/Orbits: No middle ear or mastoid effusion. Right OMC pattern paranasal sinus disease. Orbits are unremarkable. Other: None. CT CERVICAL SPINE FINDINGS Alignment: Straightening of the normal cervical lordosis. Skull base and vertebrae: No acute fracture. No primary bone lesion or focal pathologic process. Soft tissues and spinal canal: No prevertebral fluid or swelling.  No visible canal hematoma. Disc levels:  No CT evidence of high-grade spinal canal stenosis Upper chest: Negative. Other: None IMPRESSION: 1. No acute intracranial abnormality. 2. No acute fracture or traumatic subluxation of the cervical spine. Electronically Signed   By: Lyndall Gore M.D.   On: 12/15/2023 11:22    Pertinent labs & imaging results that were available during my care of the patient were reviewed by me and considered in my medical decision making (see MDM for details).  Medications Ordered in ED Medications  ketorolac  (TORADOL ) 15 MG/ML injection 15 mg (has no administration in time range)  acetaminophen  (TYLENOL ) tablet 650 mg (650 mg Oral Given 12/15/23 1011)                                                                                                                                     Procedures Procedures  (including critical care time)  Medical Decision Making / ED Course   MDM:  43 year old presenting to the emergency department with headache after head strike.  Suspect concussion.  CT head negative for any intracranial process.  Symptoms are consistent with concussion.  Differential also includes migraine headache, tension headache.  Very low concern for occult infectious process.  Discussed concussion precautions with the patient. Will discharge patient to home. All questions answered. Patient comfortable with plan of discharge. Return precautions discussed with patient and specified on the after visit summary.       Additional history obtained: -Additional history obtained from spouse -External records from outside source obtained and reviewed including: Chart review including previous notes, labs, imaging, consultation notes including prior notes    Lab Tests: -I ordered, reviewed, and interpreted labs.   The pertinent results include:   Labs Reviewed  RESP PANEL BY RT-PCR (RSV, FLU A&B, COVID)  RVPGX2    Notable for negative  Imaging Studies  ordered: I ordered imaging studies including CT head and neck On my interpretation imaging demonstrates no ICH or fracture  I independently visualized and interpreted imaging. I agree with the radiologist interpretation   Medicines ordered and prescription drug management: Meds ordered this encounter  Medications   acetaminophen  (TYLENOL ) tablet 650 mg   DISCONTD: ketorolac  (TORADOL ) 15 MG/ML injection 15 mg   ketorolac  (TORADOL ) 15 MG/ML injection 15 mg    -I have reviewed the patients home medicines and have made adjustments as needed   Social  Determinants of Health:  Diagnosis or treatment significantly limited by social determinants of health: obesity   Reevaluation: After the interventions noted above, I reevaluated the patient and found that their symptoms have improved  Co morbidities that complicate the patient evaluation  Past Medical History:  Diagnosis Date   Acid reflux    Diabetes in pregnancy    Hypertension    Seasonal allergies       Dispostion: Disposition decision including need for hospitalization was considered, and patient discharged from emergency department.    Final Clinical Impression(s) / ED Diagnoses Final diagnoses:  Concussion with unknown loss of consciousness status, initial encounter     This chart was dictated using voice recognition software.  Despite best efforts to proofread,  errors can occur which can change the documentation meaning.    Francesca Elsie CROME, MD 12/15/23 [phone]

## 2023-12-15 NOTE — ED Triage Notes (Signed)
Patient reports she fell and hit her head 3 days ago. Patient denies LOC and does not take blood thinners. C/o continued headache, dizziness, and light sensitivity.

## 2023-12-15 NOTE — ED Notes (Signed)
Pt stated she wanted to wait for recheck vitaks

## 2023-12-15 NOTE — ED Provider Triage Note (Signed)
 Emergency Medicine Provider Triage Evaluation Note  Deborah Jacobson , a 43 y.o. female  was evaluated in triage.  Pt complains of headache after fall. Patient fell and hit the top of her head on a banister 3 days ago.  No LOC or blood thinner use.  Endorses headaches and generalized weakness since incident.  Review of Systems  Positive: Headache, generalized weakness Negative: LOC, blood thinner use  Physical Exam  BP 121/74   Pulse 65   Temp 98.4 F (36.9 C) (Oral)   Resp 16   SpO2 98%  Gen:   Awake, no distress  Resp:  Normal effort  MSK:   Moves extremities without difficulty  Other:    Medical Decision Making  Medically screening exam initiated at 10:08 AM.  Appropriate orders placed.  Deborah Jacobson was informed that the remainder of the evaluation will be completed by another provider, this initial triage assessment does not replace that evaluation, and the importance of remaining in the ED until their evaluation is complete.   Waddell Sluder, PA-C 12/15/23 1011

## 2023-12-15 NOTE — Discharge Instructions (Addendum)
 Please take Tylenol  and Motrin  for your symptoms at home.  You can take 1000 mg of Tylenol  every 6 hours and 600 mg of ibuprofen  every 6 hours as needed for your symptoms.  You can take these medicines together as needed, either at the same time, or alternating every 3 hours.  Please follow up with your primary doctor or sports medicine.

## 2023-12-19 ENCOUNTER — Encounter (HOSPITAL_COMMUNITY): Payer: Self-pay | Admitting: Emergency Medicine

## 2023-12-19 ENCOUNTER — Other Ambulatory Visit: Payer: Self-pay

## 2023-12-19 ENCOUNTER — Ambulatory Visit (HOSPITAL_COMMUNITY)
Admission: EM | Admit: 2023-12-19 | Discharge: 2023-12-19 | Disposition: A | Discharge status: Home / Self Care | Payer: 59 | Attending: Family Medicine | Admitting: Family Medicine

## 2023-12-19 DIAGNOSIS — R6889 Other general symptoms and signs: Secondary | ICD-10-CM

## 2023-12-19 DIAGNOSIS — J111 Influenza due to unidentified influenza virus with other respiratory manifestations: Secondary | ICD-10-CM

## 2023-12-19 MED ORDER — ACETAMINOPHEN 325 MG PO TABS
ORAL_TABLET | ORAL | Status: AC
  Filled 2023-12-19: qty 2

## 2023-12-19 MED ORDER — OSELTAMIVIR PHOSPHATE 75 MG PO CAPS: 75.0000 mg | ORAL_CAPSULE | Freq: Two times a day (BID) | ORAL | 0 refills | Status: AC

## 2023-12-19 MED ORDER — ACETAMINOPHEN 325 MG PO TABS
650.0000 mg | ORAL_TABLET | Freq: Once | ORAL | Status: AC
  Administered 2023-12-19: 650 mg via ORAL

## 2023-12-19 NOTE — Discharge Instructions (Addendum)
 At this time I suspect that you likely have the flu based on your symptoms  I sent in a prescription for Tamiflu .  This is an antiviral medication used to treat the flu.  Please start this as soon as you can for symptom management.  If started within the first 48 hours of symptom onset it can reduce the duration of your illness as well as potentially make your symptoms a little less severe I have included medication information in your after visit summary please review this before starting the medication.  The goal of treatment at this time is to reduce your symptoms and discomfort   You can use over the counter medications such as Dayquil/Nyquil, AlkaSeltzer formulations, etc to provide further relief of symptoms according to the manufacturer's instructions   I have sent in tamiflu  for you to take for the flu. This is most effective when started within 48 hours of symptom onset. Please take as directed for the full course unless you develop side effects or an allergic reaction   Please make sure that you are increasing your fluid intake and staying well-hydrated.  If you do not feel like eating you can try supplementing with meal replacement shakes/drinks such as Ensure or you can use protein smoothies or Gatorade/Pedialyte as desired.  If your symptoms do not improve or become worse in the next 5-7 days follow-up with your PCP or return to urgent care. Go to the ER if you begin to have more serious symptoms such as shortness of breath, trouble breathing, loss of consciousness, swelling around the eyes, high fever, severe lasting headaches, vision changes or neck pain/stiffness.

## 2023-12-19 NOTE — ED Provider Notes (Signed)
 [facility]    CSN: [id number] Arrival date & time: 12/19/23  1227      History   Chief Complaint Chief Complaint  Patient presents with   URI    HPI Deborah Jacobson is a 43 y.o. female.   HPI  She reports she is having flu like symptoms She states she is having chest pains and abdominal soreness from coughing, coughing, fatigue, sore throat She reports her symptoms started yesterday  She has been taking tea with honey, alkaseltzer plus, orange juice   She reports subjective fever, postnasal drainage, nausea  She denies recent sick contacts but states she did go to [place] for a concussion and suspects she may have gotten sick there.  She states her throat is so sore she can't use a lozenge due to pain    Past Medical History:  Diagnosis Date   Acid reflux    Diabetes in pregnancy    Hypertension    Seasonal allergies     Patient Active Problem List   Diagnosis Date Noted   Influenza vaccination declined 11/23/2023   Class 3 severe obesity due to excess calories without serious comorbidity with body mass index (BMI) of 40.0 to 44.9 in adult (HCC) 11/23/2023   Pain of left great toe 11/23/2023   Palpitations 05/20/2023   Iron deficiency anemia secondary to inadequate dietary iron intake 05/20/2023   Essential hypertension 12/17/2022   Insomnia 12/17/2022   Subacromial bursitis of left shoulder joint 08/29/2020   Tendinopathy of left rotator cuff 08/29/2020   Morbid obesity with BMI of 45.0-49.9, adult (HCC) 08/09/2020   Superior glenoid labrum lesion of left shoulder 07/10/2020   Health maintenance examination 03/23/2019   Prediabetes Dec 28, 2018   Strain of back 2018/12/28   Death of family member 28-Dec-2018   Back pain 07/31/2018   Other fatigue 05/31/2018   Chronic low back pain 11/30/2017   Shifting sleep-work schedule 06/15/2017   Paradoxical insomnia 06/15/2017   Sleep related headaches 06/15/2017   Super obese 06/15/2017   Mood  complaints in sleep disorder 06/15/2017   Plantar fasciitis of left foot 03/01/2014   Porokeratosis 03/01/2014   Pain in lower limb 03/01/2014    Past Surgical History:  Procedure Laterality Date   CESAREAN SECTION     SHOULDER ARTHROSCOPY WITH BICEPS TENDON REPAIR Left 08/29/2020   Procedure: LEFT SHOULDER ARTHROSCOPY WITH BICEPS TENODESIS;  Surgeon: Jerri Kay HERO, MD;  Location: [facility];  Service: Orthopedics;  Laterality: Left;   TUBAL LIGATION      OB History     Gravida  6   Para  4   Term  4   Preterm      AB  2   Living  4      SAB  1   IAB  1   Ectopic      Multiple      Live Births  4            Home Medications    Prior to Admission medications   Medication Sig Start Date End Date Taking? Authorizing Provider  oseltamivir  (TAMIFLU ) 75 MG capsule Take 1 capsule (75 mg total) by mouth 2 (two) times daily for 5 days. 12/19/23 12/24/23 Yes Joniya Boberg E, PA-C  albuterol  (VENTOLIN  HFA) 108 (90 Base) MCG/ACT inhaler Inhale 2 puffs into the lungs every 6 (six) hours as needed for wheezing or shortness of breath. 03/13/22   Georgina Speaks, FNP  diazepam  (VALIUM ) 5  MG tablet Take one tablet by mouth with food one hour prior to procedure. May repeat 30 minutes prior if needed. 05/11/23   Williams, Megan E, NP  ferrous sulfate  325 (65 FE) MG EC tablet Take 1 tablet (325 mg total) by mouth 2 (two) times daily. Patient not taking: Reported on 11/23/2023 12/18/22 12/18/23  Moore, Janece, FNP  hydrochlorothiazide  (HYDRODIURIL ) 12.5 MG tablet TAKE 1 TABLET (12.5 MG) BY MOUTH DAILY 08/06/23   Moore, Janece, FNP  ketorolac  (TORADOL ) 10 MG tablet Take 1 tablet (10 mg total) by mouth every 6 (six) hours as needed (pain). 12/05/23   Vonna Sharlet POUR, MD  potassium chloride  (KLOR-CON ) 10 MEQ tablet Take 1 tablet (10 mEq total) by mouth daily. Patient not taking: Reported on 12/19/2023 04/12/23   Raspet, Aleighya Mcanelly K, PA-C  Semaglutide -Weight Management (WEGOVY ) 0.25 MG/0.5ML  SOAJ Inject 0.25 mg into the skin once a week. Patient not taking: Reported on 12/19/2023 11/23/23   Moore, Janece, FNP  sucralfate  (CARAFATE ) 1 g tablet Take 1 tablet (1 g total) by mouth 4 (four) times daily -  with meals and at bedtime. Patient not taking: Reported on 12/19/2023 04/11/23   Raspet, Leldon Steege K, PA-C  tiZANidine  (ZANAFLEX ) 4 MG tablet Take 1 tablet (4 mg total) by mouth every 8 (eight) hours as needed for muscle spasms. 12/05/23   Vonna Sharlet POUR, MD  traZODone  (DESYREL ) 50 MG tablet TAKE 1 TABLET BY MOUTH EVERY DAY AT BEDTIME AS NEEDED FOR SLEEP 12/07/23   Moore, Janece, FNP  triamcinolone  cream (KENALOG ) 0.5 % Apply 1 Application topically 3 (three) times daily. 10/01/22   Georgina Speaks, FNP  cetirizine (ZYRTEC) 10 MG tablet Take 10 mg by mouth daily. Patient not taking: Reported on 11/23/2023  10/13/19  [provider]    Family History Family History  Problem Relation Age of Onset   Diabetes Mother    Liver disease Mother    Hypertension Mother     Social History Social History   Tobacco Use   Smoking status: Former   Smokeless tobacco: Never  Advertising Account Planner   Vaping status: Never Used  Substance Use Topics   Alcohol use: No   Drug use: No     Allergies   Latex   Review of Systems Review of Systems  Constitutional:  Positive for chills, fatigue and fever.  HENT:  Positive for congestion, postnasal drip and sore throat. Negative for rhinorrhea.   Respiratory:  Positive for cough and chest tightness.   Gastrointestinal:  Positive for nausea. Negative for diarrhea and vomiting.     Physical Exam Triage Vital Signs ED Triage Vitals  Encounter Vitals Group     BP 12/19/23 1323 126/87     Systolic BP Percentile --      Diastolic BP Percentile --      Pulse Rate 12/19/23 1323 87     Resp 12/19/23 1323 20     Temp 12/19/23 1323 (!) 100.6 F (38.1 C)     Temp Source 12/19/23 1323 Oral     SpO2 12/19/23 1323 97 %     Weight --      Height --      Head  Circumference --      Peak Flow --      Pain Score 12/19/23 1319 9     Pain Loc --      Pain Education --      Exclude from Growth Chart --    No data found.  Updated  Vital Signs BP 126/87 (BP Location: Left Arm) Comment (BP Location): large cuff  Pulse 87   Temp (!) 100.6 F (38.1 C) (Oral)   Resp 20   LMP 12/08/2023   SpO2 97%   Visual Acuity Right Eye Distance:   Left Eye Distance:   Bilateral Distance:    Right Eye Near:   Left Eye Near:    Bilateral Near:     Physical Exam Vitals reviewed.  Constitutional:      General: She is awake.     Appearance: Normal appearance. She is well-developed and well-groomed.  HENT:     Head: Normocephalic and atraumatic.     Right Ear: Hearing, tympanic membrane and ear canal normal. There is impacted cerumen.     Left Ear: Hearing, tympanic membrane and ear canal normal. There is impacted cerumen.     Mouth/Throat:     Lips: Pink.     Mouth: Mucous membranes are moist.     Pharynx: Oropharynx is clear. Uvula midline. No pharyngeal swelling, oropharyngeal exudate, posterior oropharyngeal erythema, uvula swelling or postnasal drip.  Cardiovascular:     Rate and Rhythm: Normal rate and regular rhythm.     Pulses: Normal pulses.          Radial pulses are 2+ on the right side and 2+ on the left side.     Heart sounds: Normal heart sounds. No murmur heard.    No friction rub. No gallop.  Pulmonary:     Effort: Pulmonary effort is normal.     Breath sounds: Normal breath sounds. No decreased air movement. No decreased breath sounds, wheezing, rhonchi or rales.  Musculoskeletal:     Cervical back: Normal range of motion and neck supple.  Lymphadenopathy:     Head:     Right side of head: No submental, submandibular or preauricular adenopathy.     Left side of head: No submental, submandibular or preauricular adenopathy.     Cervical:     Right cervical: No superficial cervical adenopathy.    Left cervical: No superficial  cervical adenopathy.     Upper Body:     Right upper body: No supraclavicular adenopathy.     Left upper body: No supraclavicular adenopathy.  Neurological:     Mental Status: She is alert.  Psychiatric:        Behavior: Behavior is cooperative.      UC Treatments / Results  Labs (all labs ordered are listed, but only abnormal results are displayed) Labs Reviewed - No data to display  EKG   Radiology No results found.  Procedures Procedures (including critical care time)  Medications Ordered in UC Medications  acetaminophen  (TYLENOL ) tablet 650 mg (650 mg Oral Given 12/19/23 1331)    Initial Impression / Assessment and Plan / UC Course  I have reviewed the triage vital signs and the nursing notes.  Pertinent labs & imaging results that were available during my care of the patient were reviewed by me and considered in my medical decision making (see chart for details).      Final Clinical Impressions(s) / UC Diagnoses   Final diagnoses:  Influenza-like illness  Flu-like symptoms   Acute, new concern Patient reports that symptoms comprised of nasal congestion, postnasal drainage, sore throat, cough, chest tightness, nausea present for the last not responding to over-the-counter measures.  HPI, physical exam, vitals appear consistent with suspected flu.  Point-of-care testing for flu and COVID is not available in the clinic today.  Discussed this with the patient and she is comfortable starting Tamiflu  for presumed influenza.  Reviewed mechanism of action, side effects.  Patient education information was also provided in after visit summary.  Recommend that she continues to take over-the-counter medications as needed for symptomatic management in addition to Tamiflu .  ED return precautions reviewed and provided in after visit summary as well.  Follow-up as needed for progressing or persistent symptoms    Discharge Instructions      At this time I suspect that you  likely have the flu based on your symptoms  I sent in a prescription for Tamiflu .  This is an antiviral medication used to treat the flu.  Please start this as soon as you can for symptom management.  If started within the first 48 hours of symptom onset it can reduce the duration of your illness as well as potentially make your symptoms a little less severe I have included medication information in your after visit summary please review this before starting the medication.  The goal of treatment at this time is to reduce your symptoms and discomfort   You can use over the counter medications such as Dayquil/Nyquil, AlkaSeltzer formulations, etc to provide further relief of symptoms according to the manufacturer's instructions   I have sent in tamiflu  for you to take for the flu. This is most effective when started within 48 hours of symptom onset. Please take as directed for the full course unless you develop side effects or an allergic reaction   Please make sure that you are increasing your fluid intake and staying well-hydrated.  If you do not feel like eating you can try supplementing with meal replacement shakes/drinks such as Ensure or you can use protein smoothies or Gatorade/Pedialyte as desired.  If your symptoms do not improve or become worse in the next 5-7 days follow-up with your PCP or return to urgent care. Go to the ER if you begin to have more serious symptoms such as shortness of breath, trouble breathing, loss of consciousness, swelling around the eyes, high fever, severe lasting headaches, vision changes or neck pain/stiffness.       ED Prescriptions     Medication Sig Dispense Auth. Provider   oseltamivir  (TAMIFLU ) 75 MG capsule Take 1 capsule (75 mg total) by mouth 2 (two) times daily for 5 days. 10 capsule Emmalise Huard E, PA-C      PDMP not reviewed this encounter.   Sylver Vantassell, Rocky BRAVO, PA-C 12/19/23 1427

## 2023-12-19 NOTE — ED Triage Notes (Addendum)
 Symptoms started yesterday.  Symptoms include fever, headache, pressure, general aches and chest soreness from coughing.    Has been taking nyquil, tea, orange juice, and alka seltzer cold plus  Tylenol  around 7:30 this morning

## 2023-12-23 NOTE — Telephone Encounter (Signed)
Please provide her a work note to return on Monday February 17th.

## 2024-01-03 ENCOUNTER — Ambulatory Visit
Admission: EM | Admit: 2024-01-03 | Discharge: 2024-01-03 | Disposition: A | Discharge status: Home / Self Care | Payer: 59 | Attending: Family Medicine | Admitting: Family Medicine

## 2024-01-03 DIAGNOSIS — B349 Viral infection, unspecified: Secondary | ICD-10-CM

## 2024-01-03 LAB — POC COVID19/FLU A&B COMBO
Covid Antigen, POC: NEGATIVE
Influenza A Antigen, POC: NEGATIVE
Influenza B Antigen, POC: NEGATIVE

## 2024-01-03 LAB — POCT RAPID STREP A (OFFICE): Rapid Strep A Screen: NEGATIVE

## 2024-01-03 MED ORDER — PROMETHAZINE-DM 6.25-15 MG/5ML PO SYRP
5.0000 mL | ORAL_SOLUTION | Freq: Four times a day (QID) | ORAL | 0 refills | Status: DC | PRN
Start: 2024-01-03 — End: 2024-02-17

## 2024-01-03 MED ORDER — FLUTICASONE PROPIONATE 50 MCG/ACT NA SUSP: 1.0000 | Freq: Every day | NASAL | 0 refills | Status: DC

## 2024-01-03 NOTE — ED Provider Notes (Signed)
 [facility]    CSN: [id number] Arrival date & time: 01/03/24  0801      History   Chief Complaint Chief Complaint  Patient presents with   Generalized Body Aches    HPI Deborah Jacobson is a 43 y.o. female  presents for evaluation of URI symptoms for 1 days. Patient reports associated symptoms of sore throat, cough, body aches, chills, congestion, diarrhea. Denies nausea/vomiting, fevers, shortness of breath. Patient does not have a hx of asthma. Patient is not an active smoker.   Reports sick contacts via family.  States she was treated for presumed influenza 2 weeks ago with Tamiflu with complete resolution of symptoms.  Pt has taken TheraFlu OTC for symptoms. Pt has no other concerns at this time.   HPI  Past Medical History:  Diagnosis Date   Acid reflux    Diabetes in pregnancy    Hypertension    Seasonal allergies     Patient Active Problem List   Diagnosis Date Noted   Influenza vaccination declined 11/23/2023   Class 3 severe obesity due to excess calories without serious comorbidity with body mass index (BMI) of 40.0 to 44.9 in adult (HCC) 11/23/2023   Pain of left great toe 11/23/2023   Palpitations 05/20/2023   Iron deficiency anemia secondary to inadequate dietary iron intake 05/20/2023   Essential hypertension 12/17/2022   Insomnia 12/17/2022   Subacromial bursitis of left shoulder joint 08/29/2020   Tendinopathy of left rotator cuff 08/29/2020   Morbid obesity with BMI of 45.0-49.9, adult (HCC) 08/09/2020   Superior glenoid labrum lesion of left shoulder 07/10/2020   Health maintenance examination 03/23/2019   Prediabetes Dec 28, 2018   Strain of back 12/28/18   Death of family member 28-Dec-2018   Back pain 07/31/2018   Other fatigue 05/31/2018   Chronic low back pain 11/30/2017   Shifting sleep-work schedule 06/15/2017   Paradoxical insomnia 06/15/2017   Sleep related headaches 06/15/2017   Super obese 06/15/2017   Mood complaints in  sleep disorder 06/15/2017   Plantar fasciitis of left foot 03/01/2014   Porokeratosis 03/01/2014   Pain in lower limb 03/01/2014    Past Surgical History:  Procedure Laterality Date   CESAREAN SECTION     SHOULDER ARTHROSCOPY WITH BICEPS TENDON REPAIR Left 08/29/2020   Procedure: LEFT SHOULDER ARTHROSCOPY WITH BICEPS TENODESIS;  Surgeon: Tarry Kos, MD;  Location: [facility];  Service: Orthopedics;  Laterality: Left;   TUBAL LIGATION      OB History     Gravida  6   Para  4   Term  4   Preterm      AB  2   Living  4      SAB  1   IAB  1   Ectopic      Multiple      Live Births  4            Home Medications    Prior to Admission medications   Medication Sig Start Date End Date Taking? Authorizing Provider  albuterol (VENTOLIN HFA) 108 (90 Base) MCG/ACT inhaler Inhale 2 puffs into the lungs every 6 (six) hours as needed for wheezing or shortness of breath. 03/13/22  Yes Arnette Felts, FNP  fluticasone (FLONASE) 50 MCG/ACT nasal spray Place 1 spray into both nostrils daily. 01/03/24  Yes Radford Pax, NP  hydrochlorothiazide (HYDRODIURIL) 12.5 MG tablet TAKE 1 TABLET (12.5 MG) BY MOUTH DAILY 08/06/23  Yes Arnette Felts, FNP  promethazine-dextromethorphan (PROMETHAZINE-DM) 6.25-15 MG/5ML syrup Take 5 mLs by mouth 4 (four) times daily as needed for cough. 01/03/24  Yes Radford Pax, NP  diazepam (VALIUM) 5 MG tablet Take one tablet by mouth with food one hour prior to procedure. May repeat 30 minutes prior if needed. 05/11/23   Juanda Chance, NP  ferrous sulfate 325 (65 FE) MG EC tablet Take 1 tablet (325 mg total) by mouth 2 (two) times daily. Patient not taking: Reported on 11/23/2023 12/18/22 12/18/23  Arnette Felts, FNP  ketorolac (TORADOL) 10 MG tablet Take 1 tablet (10 mg total) by mouth every 6 (six) hours as needed (pain). 12/05/23   Zenia Resides, MD  Semaglutide-Weight Management (WEGOVY) 0.25 MG/0.5ML SOAJ Inject 0.25 mg into the skin  once a week. Patient not taking: Reported on 12/19/2023 11/23/23   Arnette Felts, FNP  sucralfate (CARAFATE) 1 g tablet Take 1 tablet (1 g total) by mouth 4 (four) times daily -  with meals and at bedtime. Patient not taking: Reported on 12/19/2023 04/11/23   Raspet, Denny Peon K, PA-C  tiZANidine (ZANAFLEX) 4 MG tablet Take 1 tablet (4 mg total) by mouth every 8 (eight) hours as needed for muscle spasms. 12/05/23   Zenia Resides, MD  traZODone (DESYREL) 50 MG tablet TAKE 1 TABLET BY MOUTH EVERY DAY AT BEDTIME AS NEEDED FOR SLEEP 12/07/23   Arnette Felts, FNP  triamcinolone cream (KENALOG) 0.5 % Apply 1 Application topically 3 (three) times daily. 10/01/22   Arnette Felts, FNP  cetirizine (ZYRTEC) 10 MG tablet Take 10 mg by mouth daily. Patient not taking: Reported on 11/23/2023  10/13/19  [provider]    Family History Family History  Problem Relation Age of Onset   Diabetes Mother    Liver disease Mother    Hypertension Mother     Social History Social History   Tobacco Use   Smoking status: Former   Smokeless tobacco: Never  Advertising account planner   Vaping status: Never Used  Substance Use Topics   Alcohol use: No   Drug use: No     Allergies   Latex   Review of Systems Review of Systems  Constitutional:  Positive for chills.  HENT:  Positive for congestion and sore throat.   Respiratory:  Positive for cough.   Gastrointestinal:  Positive for diarrhea.  Musculoskeletal:  Positive for myalgias.     Physical Exam Triage Vital Signs ED Triage Vitals  Encounter Vitals Group     BP 01/03/24 0823 118/81     Systolic BP Percentile --      Diastolic BP Percentile --      Pulse Rate 01/03/24 0823 93     Resp 01/03/24 0823 18     Temp 01/03/24 0823 99.6 F (37.6 C)     Temp Source 01/03/24 0823 Oral     SpO2 01/03/24 0823 97 %     Weight --      Height --      Head Circumference --      Peak Flow --      Pain Score 01/03/24 0811 10     Pain Loc --      Pain Education --       Exclude from Growth Chart --    No data found.  Updated Vital Signs BP 118/81 (BP Location: Left Arm)   Pulse 93   Temp 99.6 F (37.6 C) (Oral)   Resp 18   LMP 12/08/2023 (Approximate)  SpO2 97%   Visual Acuity Right Eye Distance:   Left Eye Distance:   Bilateral Distance:    Right Eye Near:   Left Eye Near:    Bilateral Near:     Physical Exam Vitals and nursing note reviewed.  Constitutional:      General: She is not in acute distress.    Appearance: She is well-developed. She is not ill-appearing.  HENT:     Head: Normocephalic and atraumatic.     Right Ear: Tympanic membrane and ear canal normal.     Left Ear: Tympanic membrane and ear canal normal.     Nose: Congestion present.     Mouth/Throat:     Mouth: Mucous membranes are moist.     Pharynx: Oropharynx is clear. Uvula midline. Posterior oropharyngeal erythema present.     Tonsils: No tonsillar exudate or tonsillar abscesses.  Eyes:     Conjunctiva/sclera: Conjunctivae normal.     Pupils: Pupils are equal, round, and reactive to light.  Cardiovascular:     Rate and Rhythm: Normal rate and regular rhythm.     Heart sounds: Normal heart sounds.  Pulmonary:     Effort: Pulmonary effort is normal.     Breath sounds: Normal breath sounds. No wheezing or rhonchi.  Musculoskeletal:     Cervical back: Normal range of motion and neck supple.  Lymphadenopathy:     Cervical: No cervical adenopathy.  Skin:    General: Skin is warm and dry.  Neurological:     General: No focal deficit present.     Mental Status: She is alert and oriented to person, place, and time.  Psychiatric:        Mood and Affect: Mood normal.        Behavior: Behavior normal.      UC Treatments / Results  Labs (all labs ordered are listed, but only abnormal results are displayed) Labs Reviewed  POCT RAPID STREP A (OFFICE) - Normal  POC COVID19/FLU A&B COMBO - Normal    EKG   Radiology No results  found.  Procedures Procedures (including critical care time)  Medications Ordered in UC Medications - No data to display  Initial Impression / Assessment and Plan / UC Course  I have reviewed the triage vital signs and the nursing notes.  Pertinent labs & imaging results that were available during my care of the patient were reviewed by me and considered in my medical decision making (see chart for details).     Reviewed exam and symptoms with patient.  No red flags.  Negative rapid flu, strep, COVID testing.  Discussed viral illness and symptomatic treatment.  Promethazine DM as needed for cough, side effect profile reviewed.  Flonase daily.  PCP follow-up if symptoms do not improve.  ER precautions reviewed. Final Clinical Impressions(s) / UC Diagnoses   Final diagnoses:  Viral illness     Discharge Instructions      Please treat your symptoms with over the counter tylenol or ibuprofen, humidifier, and rest.  May take Promethazine DM as needed for your cough.  Please note this medication can make you drowsy.  Do not drink alcohol or drive while on this medication.  Flonase daily to help with congestion and the fluid behind your ears.  Viral illnesses can last 7-14 days. Please follow up with your PCP if your symptoms are not improving. Please go to the ER for any worsening symptoms. This includes but is not limited to fever you can not  control with tylenol or ibuprofen, you are not able to stay hydrated, you have shortness of breath or chest pain.  Thank you for choosing  for your healthcare needs. I hope you feel better soon!      ED Prescriptions     Medication Sig Dispense Auth. Provider   promethazine-dextromethorphan (PROMETHAZINE-DM) 6.25-15 MG/5ML syrup Take 5 mLs by mouth 4 (four) times daily as needed for cough. 118 mL Radford Pax, NP   fluticasone (FLONASE) 50 MCG/ACT nasal spray Place 1 spray into both nostrils daily. 15.8 mL Radford Pax, NP       PDMP not reviewed this encounter.   Radford Pax, NP 01/03/24 [phone]

## 2024-01-03 NOTE — ED Triage Notes (Signed)
 Pt presents to UC for c/o sore throat, body aches, chills, head pressure starting last night. Pt states she had the flu 2 weeks ago. Taking theraflu

## 2024-01-03 NOTE — Discharge Instructions (Addendum)
 Please treat your symptoms with over the counter tylenol or ibuprofen, humidifier, and rest.  May take Promethazine DM as needed for your cough.  Please note this medication can make you drowsy.  Do not drink alcohol or drive while on this medication.  Flonase daily to help with congestion and the fluid behind your ears.  Viral illnesses can last 7-14 days. Please follow up with your PCP if your symptoms are not improving. Please go to the ER for any worsening symptoms. This includes but is not limited to fever you can not control with tylenol or ibuprofen, you are not able to stay hydrated, you have shortness of breath or chest pain.  Thank you for choosing [facility] for your healthcare needs. I hope you feel better soon!

## 2024-01-05 ENCOUNTER — Other Ambulatory Visit: Payer: Self-pay | Admitting: Podiatry

## 2024-01-07 ENCOUNTER — Encounter: Payer: Self-pay | Admitting: Nurse Practitioner

## 2024-01-07 ENCOUNTER — Ambulatory Visit (INDEPENDENT_AMBULATORY_CARE_PROVIDER_SITE_OTHER): Payer: 59 | Admitting: Nurse Practitioner

## 2024-01-07 VITALS — BP 130/70 | HR 90 | Temp 98.5°F | Ht <= 58 in | Wt 231.4 lb

## 2024-01-07 DIAGNOSIS — M25531 Pain in right wrist: Secondary | ICD-10-CM | POA: Diagnosis not present

## 2024-01-07 DIAGNOSIS — E66813 Obesity, class 3: Secondary | ICD-10-CM

## 2024-01-07 DIAGNOSIS — Z6841 Body Mass Index (BMI) 40.0 and over, adult: Secondary | ICD-10-CM

## 2024-01-07 DIAGNOSIS — M25521 Pain in right elbow: Secondary | ICD-10-CM

## 2024-01-07 DIAGNOSIS — M25431 Effusion, right wrist: Secondary | ICD-10-CM

## 2024-01-07 MED ORDER — PREDNISONE 10 MG (21) PO TBPK: ORAL_TABLET | ORAL | 0 refills | Status: DC

## 2024-01-07 NOTE — Progress Notes (Signed)
 Madelaine Bhat, CMA,acting as a Neurosurgeon for Arnette Felts, FNP.,have documented all relevant documentation on the behalf of Arnette Felts, FNP,as directed by  Arnette Felts, FNP while in the presence of Arnette Felts, FNP.  Subjective:  Patient ID: Deborah Jacobson , female    DOB: Apr 07, 1981 , 43 y.o.   MRN: [id number]  No chief complaint on file.   HPI  Patient presents today for right elbow pain that travels to her right wrist, Patient reports compliance with medication. Patient denies any chest pain, SOB, or headaches. Patient has no concerns today. Patient reports sometimes her wrist does swell. Patient reports it first started about 2 months ago but it is getting worse. 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 She is having difficulty with picking up cups and bags. She has never seen a hand specialist. She has been taking tylenol more for her viral infection. She used diclofenac cream on it last night.    Past Medical History:  Diagnosis Date   Acid reflux    Diabetes in pregnancy    Hypertension    Seasonal allergies      Family History  Problem Relation Age of Onset   Diabetes Mother    Liver disease Mother    Hypertension Mother      Current Outpatient Medications:    albuterol (VENTOLIN HFA) 108 (90 Base) MCG/ACT inhaler, Inhale 2 puffs into the lungs every 6 (six) hours as needed for wheezing or shortness of breath., Disp: 6.7 g, Rfl: 1   diazepam (VALIUM) 5 MG tablet, Take one tablet by mouth with food one hour prior to procedure. May repeat 30 minutes prior if needed., Disp: 2 tablet, Rfl: 0   fluticasone (FLONASE) 50 MCG/ACT nasal spray, Place 1 spray into both nostrils daily., Disp: 15.8 mL, Rfl: 0   hydrochlorothiazide (HYDRODIURIL) 12.5 MG tablet, TAKE 1 TABLET (12.5 MG) BY MOUTH DAILY, Disp: 90 tablet, Rfl: 2   ketorolac (TORADOL) 10 MG tablet, Take 1 tablet (10 mg total) by mouth every 6 (six) hours as needed (pain)., Disp: 20 tablet, Rfl: 0   meloxicam  (MOBIC) 15 MG tablet, TAKE 1/2 OR 1 TABLET BY MOUTH DAILY AS NEEDED FOR PAIN, Disp: 30 tablet, Rfl: 1   predniSONE (STERAPRED UNI-PAK 21 TAB) 10 MG (21) TBPK tablet, Take as directed, Disp: 21 tablet, Rfl: 0   promethazine-dextromethorphan (PROMETHAZINE-DM) 6.25-15 MG/5ML syrup, Take 5 mLs by mouth 4 (four) times daily as needed for cough., Disp: 118 mL, Rfl: 0   tiZANidine (ZANAFLEX) 4 MG tablet, Take 1 tablet (4 mg total) by mouth every 8 (eight) hours as needed for muscle spasms., Disp: 15 tablet, Rfl: 0   traZODone (DESYREL) 50 MG tablet, TAKE 1 TABLET BY MOUTH EVERY DAY AT BEDTIME AS NEEDED FOR SLEEP, Disp: 30 tablet, Rfl: 2   triamcinolone cream (KENALOG) 0.5 %, Apply 1 Application topically 3 (three) times daily., Disp: 30 g, Rfl: 1   amoxicillin-clavulanate (AUGMENTIN) 875-125 MG tablet, Take 1 tablet by mouth 2 (two) times daily., Disp: 14 tablet, Rfl: 0   ferrous sulfate 325 (65 FE) MG EC tablet, Take 1 tablet (325 mg total) by mouth 2 (two) times daily. (Patient not taking: Reported on 05/20/2023), Disp: 60 tablet, Rfl: 3   sucralfate (CARAFATE) 1 g tablet, Take 1 tablet (1 g total) by mouth 4 (four) times daily -  with meals and at bedtime., Disp: 28 tablet, Rfl: 0   Allergies  Allergen Reactions   Latex Hives  Powder from gloves     Review of Systems  Constitutional: Negative.   Respiratory: Negative.  Negative for cough.   Cardiovascular: Negative.  Negative for chest pain, palpitations and leg swelling.  Genitourinary: Negative.   Musculoskeletal:  Negative for myalgias.  Neurological:  Negative for dizziness.  Psychiatric/Behavioral: Negative.       Today's Vitals   01/07/24 1155  BP: 130/70  Pulse: 90  Temp: 98.5 F (36.9 C)  TempSrc: Oral  Weight: 231 lb 6.4 oz (105 kg)  Height: 4\' 9"  (1.448 m)  PainSc: 4   PainLoc: Elbow   Body mass index is 50.07 kg/m.  Wt Readings from Last 3 Encounters:  01/14/24 238 lb (108 kg)  01/07/24 231 lb 6.4 oz (105 kg)   12/15/23 228 lb 9.9 oz (103.7 kg)    Objective:  Physical Exam Vitals reviewed.  Constitutional:      General: She is not in acute distress.    Appearance: Normal appearance. She is well-developed. She is obese.  Cardiovascular:     Rate and Rhythm: Normal rate and regular rhythm.     Pulses: Normal pulses.     Heart sounds: Normal heart sounds. No murmur heard. Pulmonary:     Effort: Pulmonary effort is normal. No respiratory distress.     Breath sounds: Normal breath sounds. No wheezing.  Chest:     Chest wall: No tenderness.  Musculoskeletal:        General: No swelling or tenderness. Normal range of motion.     Comments: She does have an area to the left great toe medial.   Skin:    General: Skin is warm and dry.     Capillary Refill: Capillary refill takes less than 2 seconds.  Neurological:     General: No focal deficit present.     Mental Status: She is alert and oriented to person, place, and time.     Cranial Nerves: No cranial nerve deficit.     Motor: No weakness.  Psychiatric:        Mood and Affect: Mood normal.        Behavior: Behavior normal.        Thought Content: Thought content normal.        Judgment: Judgment normal.         Assessment And Plan:  Right elbow pain Assessment & Plan: Tenderness to lateral elbow likely epicondylitis. Will treat with steroid.   Orders: -     predniSONE; Take as directed  Dispense: 21 tablet; Refill: 0  Right wrist pain -     predniSONE; Take as directed  Dispense: 21 tablet; Refill: 0  Swelling of right wrist Assessment & Plan: No obvious swelling on physical exam.    Class 3 severe obesity due to excess calories without serious comorbidity with body mass index (BMI) of 50.0 to 59.9 in adult [facility] Assessment & Plan: She is encouraged to strive for BMI less than 30 to decrease cardiac risk. Advised to aim for at least 150 minutes of exercise per week.      No follow-ups on file.  Patient was given  opportunity to ask questions. Patient verbalized understanding of the plan and was able to repeat key elements of the plan. All questions were answered to their satisfaction.     Jeanell Sparrow, FNP, have reviewed all documentation for this visit. The documentation on 01/07/24 for the exam, diagnosis, procedures, and orders are all accurate and complete.  IF YOU HAVE  BEEN REFERRED TO A SPECIALIST, IT MAY TAKE 1-2 WEEKS TO SCHEDULE/PROCESS THE REFERRAL. IF YOU HAVE NOT HEARD FROM US/SPECIALIST IN TWO WEEKS, PLEASE GIVE [place] A CALL AT [phone].

## 2024-01-13 ENCOUNTER — Encounter: Payer: Self-pay | Admitting: Nurse Practitioner

## 2024-01-14 ENCOUNTER — Encounter: Payer: Self-pay | Admitting: Nurse Practitioner

## 2024-01-14 ENCOUNTER — Ambulatory Visit (INDEPENDENT_AMBULATORY_CARE_PROVIDER_SITE_OTHER): Payer: Self-pay | Admitting: Nurse Practitioner

## 2024-01-14 VITALS — BP 130/80 | HR 86 | Temp 98.2°F | Ht <= 58 in | Wt 238.0 lb

## 2024-01-14 DIAGNOSIS — J0110 Acute frontal sinusitis, unspecified: Secondary | ICD-10-CM | POA: Insufficient documentation

## 2024-01-14 DIAGNOSIS — H6121 Impacted cerumen, right ear: Secondary | ICD-10-CM

## 2024-01-14 MED ORDER — AMOXICILLIN-POT CLAVULANATE 875-125 MG PO TABS: 1.0000 | ORAL_TABLET | Freq: Two times a day (BID) | ORAL | 0 refills | Status: DC

## 2024-01-14 NOTE — Progress Notes (Signed)
 Madelaine Bhat, CMA,acting as a Neurosurgeon for Arnette Felts, FNP.,have documented all relevant documentation on the behalf of Arnette Felts, FNP,as directed by  Arnette Felts, FNP while in the presence of Arnette Felts, FNP.  Subjective:  Patient ID: Deborah Jacobson , female    DOB: 1981-10-08 , 43 y.o.   MRN: [id number]  No chief complaint on file.   HPI  Patient states she had flu 2 weeks ago, patient reports after she had a viral infection. Started two weeks ago, sore throat, thick green mucus.  Feels worse as the day goes on.  Having some weakness and sinus pressure.  No nausea or vomiting, diarrhea twice last night.  Has been able to eat and drink, has been drinking water, appetite has improved but not back to same.  Has take many over the counter cold medications that offer some relief but not completely.  Has not had a fever over the past week.  Has feeling of pressure in right ear.  Patient reports she gets better but now she feels congestion, sore throat, and dark mucus, coughing, and right ear pain. Does not smoke, has seasonal allergies.  Was seen at urgent care on 01/03/2024. Patient reports she has diarrhea starting last night. Oldest daughter is sick also in home.   Patient reports she was giving tamiflu and completed it     Past Medical History:  Diagnosis Date   Acid reflux    Diabetes in pregnancy    Hypertension    Seasonal allergies      Family History  Problem Relation Age of Onset   Diabetes Mother    Liver disease Mother    Hypertension Mother      Current Outpatient Medications:    albuterol (VENTOLIN HFA) 108 (90 Base) MCG/ACT inhaler, Inhale 2 puffs into the lungs every 6 (six) hours as needed for wheezing or shortness of breath., Disp: 6.7 g, Rfl: 1   amoxicillin-clavulanate (AUGMENTIN) 875-125 MG tablet, Take 1 tablet by mouth 2 (two) times daily., Disp: 14 tablet, Rfl: 0   diazepam (VALIUM) 5 MG tablet, Take one tablet by mouth with food one hour prior to  procedure. May repeat 30 minutes prior if needed., Disp: 2 tablet, Rfl: 0   fluticasone (FLONASE) 50 MCG/ACT nasal spray, Place 1 spray into both nostrils daily., Disp: 15.8 mL, Rfl: 0   hydrochlorothiazide (HYDRODIURIL) 12.5 MG tablet, TAKE 1 TABLET (12.5 MG) BY MOUTH DAILY, Disp: 90 tablet, Rfl: 2   ketorolac (TORADOL) 10 MG tablet, Take 1 tablet (10 mg total) by mouth every 6 (six) hours as needed (pain)., Disp: 20 tablet, Rfl: 0   meloxicam (MOBIC) 15 MG tablet, TAKE 1/2 OR 1 TABLET BY MOUTH DAILY AS NEEDED FOR PAIN, Disp: 30 tablet, Rfl: 1   predniSONE (STERAPRED UNI-PAK 21 TAB) 10 MG (21) TBPK tablet, Take as directed, Disp: 21 tablet, Rfl: 0   promethazine-dextromethorphan (PROMETHAZINE-DM) 6.25-15 MG/5ML syrup, Take 5 mLs by mouth 4 (four) times daily as needed for cough., Disp: 118 mL, Rfl: 0   sucralfate (CARAFATE) 1 g tablet, Take 1 tablet (1 g total) by mouth 4 (four) times daily -  with meals and at bedtime., Disp: 28 tablet, Rfl: 0   tiZANidine (ZANAFLEX) 4 MG tablet, Take 1 tablet (4 mg total) by mouth every 8 (eight) hours as needed for muscle spasms., Disp: 15 tablet, Rfl: 0   traZODone (DESYREL) 50 MG tablet, TAKE 1 TABLET BY MOUTH EVERY DAY AT BEDTIME AS NEEDED FOR SLEEP,  Disp: 30 tablet, Rfl: 2   triamcinolone cream (KENALOG) 0.5 %, Apply 1 Application topically 3 (three) times daily., Disp: 30 g, Rfl: 1   ferrous sulfate 325 (65 FE) MG EC tablet, Take 1 tablet (325 mg total) by mouth 2 (two) times daily. (Patient not taking: Reported on 05/20/2023), Disp: 60 tablet, Rfl: 3   Allergies  Allergen Reactions   Latex Hives    Powder from gloves     Review of Systems  Constitutional: Negative.   HENT:  Positive for ear pain and sinus pain.   Eyes: Negative.   Respiratory:  Positive for cough.   Cardiovascular: Negative.   Gastrointestinal:  Positive for diarrhea.  Endocrine: Negative.   Musculoskeletal: Negative.   Skin: Negative.   Allergic/Immunologic: Positive for  environmental allergies.  Neurological:  Positive for headaches.  Hematological: Negative.   Psychiatric/Behavioral: Negative.       Today's Vitals   01/14/24 1409  BP: 130/80  Pulse: 86  Temp: 98.2 F (36.8 C)  TempSrc: Oral  Weight: 238 lb (108 kg)  Height: 4\' 9"  (1.448 m)  PainSc: 0-No pain   Body mass index is 51.5 kg/m.  Wt Readings from Last 3 Encounters:  01/14/24 238 lb (108 kg)  01/07/24 231 lb 6.4 oz (105 kg)  12/15/23 228 lb 9.9 oz (103.7 kg)      Objective:  Physical Exam Constitutional:      Appearance: Normal appearance. She is obese.  HENT:     Head: Normocephalic and atraumatic.     Right Ear: External ear normal. Tenderness (prearicular area) present. There is impacted cerumen.     Left Ear: Tympanic membrane, ear canal and external ear normal. No tenderness. There is no impacted cerumen.     Nose: Nose normal.  Cardiovascular:     Rate and Rhythm: Normal rate and regular rhythm.     Pulses: Normal pulses.     Heart sounds: Normal heart sounds. No murmur heard. Pulmonary:     Effort: Pulmonary effort is normal. No respiratory distress.     Breath sounds: Normal breath sounds. No wheezing.  Musculoskeletal:     Cervical back: Normal range of motion and neck supple.  Skin:    General: Skin is warm and dry.  Neurological:     General: No focal deficit present.     Mental Status: She is alert and oriented to person, place, and time. Mental status is at baseline.  Psychiatric:        Mood and Affect: Mood normal.        Behavior: Behavior normal.        Thought Content: Thought content normal.        Judgment: Judgment normal.         Assessment And Plan:  Acute non-recurrent frontal sinusitis Assessment & Plan: Tenderness to frontal sinuses and has had approximately 2 week history of sinus pain/pressure. Will treat with antibiotic.  Orders: -     Amoxicillin-Pot Clavulanate; Take 1 tablet by mouth 2 (two) times daily.  Dispense: 14 tablet;  Refill: 0  Impacted cerumen of right ear Assessment & Plan: Semisoft cerumen present to canal will not clean with peroxide and water due to having cold symptoms today. She is advised to avoid q-tips and when feeling better can cleanse ears wit 1/2 water and 1/2 peroxide.     Return for keep same next.  Patient was given opportunity to ask questions. Patient verbalized understanding of the plan and was able  to repeat key elements of the plan. All questions were answered to their satisfaction.    Jeanell Sparrow, FNP, have reviewed all documentation for this visit. The documentation on 01/14/24 for the exam, diagnosis, procedures, and orders are all accurate and complete.   IF YOU HAVE BEEN REFERRED TO A SPECIALIST, IT MAY TAKE 1-2 WEEKS TO SCHEDULE/PROCESS THE REFERRAL. IF YOU HAVE NOT HEARD FROM US/SPECIALIST IN TWO WEEKS, PLEASE GIVE [place] A CALL AT [phone].

## 2024-01-21 DIAGNOSIS — M25431 Effusion, right wrist: Secondary | ICD-10-CM | POA: Insufficient documentation

## 2024-01-21 DIAGNOSIS — M25521 Pain in right elbow: Secondary | ICD-10-CM | POA: Insufficient documentation

## 2024-01-21 DIAGNOSIS — M25531 Pain in right wrist: Secondary | ICD-10-CM | POA: Insufficient documentation

## 2024-01-21 DIAGNOSIS — E66813 Obesity, class 3: Secondary | ICD-10-CM | POA: Insufficient documentation

## 2024-01-21 NOTE — Assessment & Plan Note (Signed)
 She is encouraged to strive for BMI less than 30 to decrease cardiac risk. Advised to aim for at least 150 minutes of exercise per week.

## 2024-01-21 NOTE — Assessment & Plan Note (Signed)
 No obvious swelling on physical exam.

## 2024-01-21 NOTE — Assessment & Plan Note (Signed)
 Tenderness to lateral elbow likely epicondylitis. Will treat with steroid.

## 2024-01-24 DIAGNOSIS — H6121 Impacted cerumen, right ear: Secondary | ICD-10-CM | POA: Insufficient documentation

## 2024-01-24 NOTE — Assessment & Plan Note (Signed)
 Tenderness to frontal sinuses and has had approximately 2 week history of sinus pain/pressure. Will treat with antibiotic.

## 2024-01-24 NOTE — Assessment & Plan Note (Addendum)
 Semisoft cerumen present to canal will not clean with peroxide and water due to having cold symptoms today. She is advised to avoid q-tips and when feeling better can cleanse ears wit 1/2 water and 1/2 peroxide.

## 2024-02-09 ENCOUNTER — Encounter: Payer: Self-pay | Admitting: Nurse Practitioner

## 2024-02-17 ENCOUNTER — Ambulatory Visit: Payer: Self-pay

## 2024-02-17 ENCOUNTER — Telehealth (INDEPENDENT_AMBULATORY_CARE_PROVIDER_SITE_OTHER): Admitting: Nurse Practitioner

## 2024-02-17 VITALS — BP 104/79 | HR 91 | Temp 98.7°F

## 2024-02-17 DIAGNOSIS — R1110 Vomiting, unspecified: Secondary | ICD-10-CM | POA: Diagnosis not present

## 2024-02-17 DIAGNOSIS — R197 Diarrhea, unspecified: Secondary | ICD-10-CM | POA: Diagnosis not present

## 2024-02-17 NOTE — Progress Notes (Addendum)
 Virtual Visit via Video Note  I,Jameka J Llittleton, CMA,acting as a scribe for Susanna Epley, FNP.,have documented all relevant documentation on the behalf of Susanna Epley, FNP,as directed by  Susanna Epley, FNP while in the presence of Susanna Epley, FNP.  I connected with Deborah Jacobson on 02/17/2024 at  2:20 PM EDT by a video enabled telemedicine application and verified that I am speaking with the correct person using two identifiers.  Patient Location: Home Provider location: Office  I discussed the limitations, risks, security, and privacy concerns of performing an evaluation and management service by video and the availability of in person appointments. I also discussed with the patient that there may be a patient responsible charge related to this service. The patient expressed understanding and agreed to proceed.  Subjective: PCP: Susanna Epley, FNP  Chief Complaint  Patient presents with   Emesis   Patient reports her daughter tested positive for norovirus. On Monday she began having body aches. Patient reports she has been experiencing chills, fever (100.9) now down to 97.8, and fatigue. She reports she no longer has a fever.  She reports she feels better overall but she still feels tired and is having stomach cramps. She has not had diarrhea. She did have vomiting this morning. She has been drinking gatorade and seltzer water.      ROS: Per HPI  Current Outpatient Medications:    albuterol  (VENTOLIN  HFA) 108 (90 Base) MCG/ACT inhaler, Inhale 2 puffs into the lungs every 6 (six) hours as needed for wheezing or shortness of breath., Disp: 6.7 g, Rfl: 1   cetirizine (ZYRTEC) 10 MG chewable tablet, Chew 10 mg by mouth daily., Disp: , Rfl:    ferrous sulfate  325 (65 FE) MG EC tablet, Take 1 tablet (325 mg total) by mouth 2 (two) times daily., Disp: 60 tablet, Rfl: 3   fluticasone  (FLONASE ) 50 MCG/ACT nasal spray, Place 1 spray into both nostrils daily., Disp: 15.8 mL, Rfl: 0    hydrochlorothiazide  (HYDRODIURIL ) 12.5 MG tablet, TAKE 1 TABLET (12.5 MG) BY MOUTH DAILY, Disp: 90 tablet, Rfl: 2   traZODone  (DESYREL ) 50 MG tablet, TAKE 1 TABLET BY MOUTH EVERY DAY AT BEDTIME AS NEEDED FOR SLEEP, Disp: 30 tablet, Rfl: 2  Observations/Objective: Today's Vitals   02/17/24 1152  BP: 104/79  Pulse: 91  Temp: 98.7 F (37.1 C)   Physical Exam Vitals reviewed.  Constitutional:      General: She is not in acute distress.    Appearance: Normal appearance. She is obese.  Pulmonary:     Effort: Pulmonary effort is normal. No respiratory distress.  Neurological:     General: No focal deficit present.     Mental Status: She is alert and oriented to person, place, and time.     Cranial Nerves: No cranial nerve deficit.     Motor: No weakness.  Psychiatric:        Mood and Affect: Mood normal.        Behavior: Behavior normal.        Thought Content: Thought content normal.        Judgment: Judgment normal.     Assessment and Plan: Vomiting and diarrhea Assessment & Plan: Symptoms are improving however will keep out of work one more day due to having mild symptoms. Work note provided. Continue with fluids and advance diet as tolerated     Follow Up Instructions: Return if symptoms worsen or fail to improve.   I discussed the assessment and  treatment plan with the patient. The patient was provided an opportunity to ask questions, and all were answered. The patient agreed with the plan and demonstrated an understanding of the instructions.   The patient was advised to call back or seek an in-person evaluation if the symptoms worsen or if the condition fails to improve as anticipated.  The above assessment and management plan was discussed with the patient. The patient verbalized understanding of and has agreed to the management plan.   Inge Mangle, FNP, have reviewed all documentation for this visit. The documentation on 02/17/24 for the exam, diagnosis,  procedures, and orders are all accurate and complete.

## 2024-02-17 NOTE — Telephone Encounter (Unsigned)
 Copied from CRM [id number]. Topic: Clinical - Pink Word Triage >> Feb 17, 2024 11:03 AM Beacher May wrote: Reason for Triage: Daughter was diagnosed with Nurovirus on Sunday 4/6, and now pt is experincing fatigue and virus symptoms, cramps, and vommiting. Pink word warm transferred. Also changed her current address and updated.

## 2024-02-17 NOTE — Telephone Encounter (Signed)
  Chief Complaint: cramping, nausea Symptoms: abdominal cramps, nausea,  Frequency: intermittent Pertinent Negatives: Patient denies diarrhea Disposition: [] ED /[] Urgent Care (no appt availability in office) / [] Appointment(In office/virtual)/ []  [facility]/ [x] Home Care/ [] Refused Recommended Disposition /[] [place] [facility]/ []  Follow-up with PCP Additional Notes:  Patients daughter has norovirus. Kita began experiencing symptoms on Monday, Body aches, & nausea. On 02/16/24 she vomited X 2 and developed fever of 100.7. Today she feels improved, no longer having body aches, feels drained, fever has resolved current temp is 98.7. Still having some mild intermittent stomach cramps with nausea. Wants to know when to she is no longer contagious. Advised when she is fever free for 24 hours she could consider herself no longer contagious. Plan to increase hydration and home care. Educated on care advice as documented in protocol, patient verbalized understanding. Discussed reasons to call back.    Message from [place] J sent at 02/17/2024 11:09 AM EDT  Copied From CRM [id number]. Reason for Triage: Daughter was diagnosed with Nurovirus on Sunday 4/6, and now pt is experincing fatigue and virus symptoms, cramps, and vommiting. Pink word warm transferred. Also changed her current address and updated.   Reason for Disposition  MILD or MODERATE vomiting (e.g., 1 - 5 times / day)  Protocols used: Vomiting-A-AH

## 2024-02-17 NOTE — Telephone Encounter (Signed)
 Copied from CRM [id number]. Topic: Clinical - Pink Word Triage >> Feb 17, 2024 11:03 AM Beacher May wrote: Reason for Triage: Daughter was diagnosed with Norovirus on Sunday 4/6, and now pt is experincing fatigue and virus symptoms, cramps, and vommiting. Pink word warm transferred. Also changed her current address and updated.   Chief Complaint: Vomiting  Symptoms: Nausea, vomiting, abdominal cramping, fatigue  Frequency: Improved Pertinent Negatives: Patient denies Diarrhea Disposition: [] ED /[] Urgent Care (no appt availability in office) / [] Appointment(In office/virtual)/ []  [facility]/ [x] Home Care/ [] Refused Recommended Disposition /[] [facility]/ []  Follow-up with PCP Additional Notes: Patient reports that 3 days ago her daughter was diagnosed with norovirus. The patient states that she began to feel sick 2 days ago and had nausea, vomiting, and abdominal cramps. She states that her symptoms have been improving, stating she vomited once today and that her nausea is resolved, but states she is still feeling fatigued. Patient wanted to know if it would be okay for her to go to work. I advised her that she might still be contagious and that she should call her workplace to advised them of her symptoms and see what they would like her to do. Patient understood and is agreeable with this plan. Patient does not feel the need to make an appointment at this time and will call back if that changes.     Reason for Disposition  MILD or MODERATE vomiting (e.g., 1 - 5 times / day)  Answer Assessment - Initial Assessment Questions 1. VOMITING SEVERITY: "How many times have you vomited in the past 24 hours?"     - MILD:  1 - 2 times/day    - MODERATE: 3 - 5 times/day, decreased oral intake without significant weight loss or symptoms of dehydration    - SEVERE: 6 or more times/day, vomits everything or nearly everything, with significant weight loss, symptoms of dehydration      1 time  today  2. ONSET: "When did the vomiting begin?"      2 days ago  3. FLUIDS: "What fluids or food have you vomited up today?" "Have you been able to keep any fluids down?"     Yes 4. ABDOMEN PAIN: "Are your having any abdomen pain?" If Yes : "How bad is it and what does it feel like?" (e.g., crampy, dull, intermittent, constant)      Mild cramps  5. DIARRHEA: "Is there any diarrhea?" If Yes, ask: "How many times today?"      No 6. CONTACTS: "Is there anyone else in the family with the same symptoms?"      Yes, daughter  38. CAUSE: "What do you think is causing your vomiting?"     Daughter has norovirus  8. HYDRATION STATUS: "Any signs of dehydration?" (e.g., dry mouth [not only dry lips], too weak to stand) "When did you last urinate?"     No 9. OTHER SYMPTOMS: "Do you have any other symptoms?" (e.g., fever, headache, vertigo, vomiting blood or coffee grounds, recent head injury)     Fatigued  Protocols used: Vomiting-A-AH

## 2024-02-25 ENCOUNTER — Ambulatory Visit: Admitting: Podiatry

## 2024-02-28 DIAGNOSIS — R1110 Vomiting, unspecified: Secondary | ICD-10-CM | POA: Insufficient documentation

## 2024-02-28 NOTE — Assessment & Plan Note (Signed)
 Symptoms are improving however will keep out of work one more day due to having mild symptoms. Work note provided. Continue with fluids and advance diet as tolerated

## 2024-03-01 ENCOUNTER — Ambulatory Visit (INDEPENDENT_AMBULATORY_CARE_PROVIDER_SITE_OTHER)

## 2024-03-01 ENCOUNTER — Ambulatory Visit (INDEPENDENT_AMBULATORY_CARE_PROVIDER_SITE_OTHER): Admitting: Podiatry

## 2024-03-01 ENCOUNTER — Encounter: Payer: Self-pay | Admitting: Podiatry

## 2024-03-01 VITALS — Ht <= 58 in | Wt 238.0 lb

## 2024-03-01 DIAGNOSIS — M2011 Hallux valgus (acquired), right foot: Secondary | ICD-10-CM

## 2024-03-01 DIAGNOSIS — M21611 Bunion of right foot: Secondary | ICD-10-CM

## 2024-03-01 DIAGNOSIS — M2012 Hallux valgus (acquired), left foot: Secondary | ICD-10-CM

## 2024-03-01 DIAGNOSIS — M21612 Bunion of left foot: Secondary | ICD-10-CM

## 2024-03-01 DIAGNOSIS — M21619 Bunion of unspecified foot: Secondary | ICD-10-CM

## 2024-03-01 NOTE — Patient Instructions (Signed)
 VISIT SUMMARY:  Today, we discussed the ongoing pain and swelling you are experiencing due to bunions on both of your feet, with the left foot being more painful. We reviewed your history of plantar fasciitis and the various non-surgical treatments you have tried. We also talked about your family history of bunions and your job, which requires you to be on your feet frequently.  YOUR PLAN:  -BILATERAL PAINFUL BUNIONS WITH HALLUX VALGUS: Bunions are bony bumps that form on the joint at the base of your big toe, causing pain and swelling. Hallux valgus is a condition where the big toe deviates towards the other toes. We discussed surgical options for correcting the bunions, which involve cutting and realigning the bone and fixing it with screws. The surgery has risks, including infection, nerve pain, and the possibility of the bunions coming back. You are advised to quit smoking/vaping to reduce the risk of complications after surgery. In the meantime, you should wear wider shoes with a roomy toe box and soft material to reduce pressure on your bunions (Hoka, Altra, and Vaughn Georges are good options). Using bunion shields can also help alleviate pressure while wearing shoes. We will plan the surgery within the next two to three months, and you should consider work accommodations for [employer] duties after the surgery, if this is not possible you will need to plan to be out of work for about 3 months. A follow-up appointment will be scheduled to plan the surgery and obtain your consent.  INSTRUCTIONS:  We will schedule a follow-up appointment to plan your surgery and obtain your consent. Please consider quitting smoking to improve your surgical outcomes and reduce complications. In the meantime, try wearing wider shoes with a boxy toe box and soft material, and use bunion shields to alleviate pressure on your bunions.

## 2024-03-01 NOTE — Progress Notes (Signed)
 Subjective:  Patient ID: Deborah Jacobson, female    DOB: Apr 25, 1981,  MRN: [id number]  Chief Complaint  Patient presents with   Bunions    Pt is here due to bilateral bunion pain, pt states the pain has been going on for quite sometime, no relief with OTC meds.    Discussed the use of AI scribe software for clinical note transcription with the patient, who gave verbal consent to proceed.  History of Present Illness Deborah Jacobson is a 43 year old female who presents with painful bunions bilaterally, left worse than right.  She experiences daily pain in her feet, specifically around the bunions, with the left foot being more painful than the right. The pain is described as being located 'all around' the bunion area and is associated with swelling. Additionally, she experiences pain on the plantar surface of her feet.  She has a history of plantar fasciitis in both feet, but notes that it has not been bothersome recently.  She has attempted various non-surgical treatments including foot exercises from YouTube, bunion correctors, and braces. However, she has not tried wearing wider shoes.  She mentions a family history of bunions, attributing it to her mother.  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 She does not have diabetes, does not use tobacco, but does use nicotine vapes, which she is planning to quit.  No significant issues other than the foot pain and swelling.      Objective:    Physical Exam VASCULAR: DP and PT pulse palpable. Foot is warm and well-perfused. Capillary fill time is brisk. DERMATOLOGIC: Normal skin turgor, texture, and temperature. No open lesions, rashes, or ulcerations. NEUROLOGIC: Normal sensation to light touch and pressure. No paresthesias. ORTHOPEDIC: Hallux valgus deformity with painful bunions bilaterally, left worse than right. Full intact range of motion of the MTP joint bilaterally. No crepitance or arthritic changes. No excessive  hypermobility. Smooth pain-free range of motion of all examined joints. No ecchymosis or bruising. No gross deformity. No pain to palpation.   No images are attached to the encounter.    Results RADIOLOGY Foot Radiographs: Moderate hallux valgus deformity with hallux abductus increase and mild metatarsus adductus. No arthritic changes. Bone density normal. (03/01/2024)   Assessment:   1. Hallux valgus with bunions, right   2. Hallux valgus with bunions, left      Plan:  Patient was evaluated and treated and all questions answered.  Assessment and Plan Assessment & Plan Bilateral painful bunions with hallux valgus Chronic bilateral painful bunions with hallux valgus deformity, more severe on the left foot. Pain is exacerbated by pressure and swelling, with no arthritic changes on radiographs. Conservative measures, including bunion correctors and exercises, have been attempted. Surgical intervention is recommended for definitive correction, involving osteotomy and fixation with screws. Risks include infection, nerve pain, and potential recurrence of bunions. Smoking cessation is advised to reduce postoperative complications. There is a 5% risk of bunion recurrence and a 1 in 20 chance of bone healing issues requiring revision surgery. Postoperative recovery involves two months of limited mobility and gradual return to full activity. - Discuss surgical options for bunion correction, including osteotomy and fixation with screws. - Advise smoking cessation to improve surgical outcomes and reduce complications. - Recommend wearing wider shoes with a boxy toe box and soft material to reduce pressure on bunions. - Suggest use of bunion shields to alleviate pressure while wearing shoes. - Plan surgery with a timeline of two to  three months for scheduling, allowing adequate recovery time between procedures on each foot. - Advise considering work accommodations for [employer] duties post-surgery. -  Schedule follow-up for surgical planning and consent once timing is decided.      Return when ready to plan for bunion surgery.

## 2024-03-03 ENCOUNTER — Other Ambulatory Visit: Payer: Self-pay | Admitting: Podiatry

## 2024-03-03 ENCOUNTER — Other Ambulatory Visit: Payer: Self-pay | Admitting: Nurse Practitioner

## 2024-03-03 DIAGNOSIS — G4700 Insomnia, unspecified: Secondary | ICD-10-CM

## 2024-03-07 ENCOUNTER — Other Ambulatory Visit: Payer: Self-pay | Admitting: Nurse Practitioner

## 2024-03-07 NOTE — Progress Notes (Signed)
 done

## 2024-03-11 ENCOUNTER — Other Ambulatory Visit: Payer: Self-pay | Admitting: Nurse Practitioner

## 2024-04-04 ENCOUNTER — Encounter: Payer: Self-pay | Admitting: Nurse Practitioner

## 2024-04-05 NOTE — Telephone Encounter (Signed)
 Can you see if we did a PA?

## 2024-05-05 ENCOUNTER — Encounter: Payer: Self-pay | Admitting: Obstetrics and Gynecology

## 2024-05-05 ENCOUNTER — Encounter: Payer: Self-pay | Admitting: Podiatry

## 2024-05-06 ENCOUNTER — Encounter: Payer: Self-pay | Admitting: Nurse Practitioner

## 2024-05-07 ENCOUNTER — Other Ambulatory Visit: Payer: Self-pay | Admitting: Nurse Practitioner

## 2024-05-07 DIAGNOSIS — R7303 Prediabetes: Secondary | ICD-10-CM

## 2024-05-23 ENCOUNTER — Ambulatory Visit: Payer: 59 | Admitting: Nurse Practitioner

## 2024-06-14 ENCOUNTER — Ambulatory Visit (INDEPENDENT_AMBULATORY_CARE_PROVIDER_SITE_OTHER): Admitting: Nurse Practitioner

## 2024-06-14 ENCOUNTER — Encounter (INDEPENDENT_AMBULATORY_CARE_PROVIDER_SITE_OTHER): Payer: Self-pay

## 2024-06-14 ENCOUNTER — Encounter: Payer: Self-pay | Admitting: Nurse Practitioner

## 2024-06-14 ENCOUNTER — Other Ambulatory Visit: Payer: Self-pay | Admitting: Nurse Practitioner

## 2024-06-14 VITALS — BP 120/80 | HR 78 | Temp 98.8°F | Ht <= 58 in | Wt 233.6 lb

## 2024-06-14 DIAGNOSIS — E66813 Obesity, class 3: Secondary | ICD-10-CM

## 2024-06-14 DIAGNOSIS — R7303 Prediabetes: Secondary | ICD-10-CM | POA: Diagnosis not present

## 2024-06-14 DIAGNOSIS — E559 Vitamin D deficiency, unspecified: Secondary | ICD-10-CM | POA: Diagnosis not present

## 2024-06-14 DIAGNOSIS — I10 Essential (primary) hypertension: Secondary | ICD-10-CM

## 2024-06-14 DIAGNOSIS — Z6841 Body Mass Index (BMI) 40.0 and over, adult: Secondary | ICD-10-CM

## 2024-06-14 DIAGNOSIS — L659 Nonscarring hair loss, unspecified: Secondary | ICD-10-CM

## 2024-06-14 MED ORDER — NALTREXONE-BUPROPION HCL ER 8-90 MG PO TB12: ORAL_TABLET | ORAL | 1 refills | Status: DC

## 2024-06-14 NOTE — Progress Notes (Unsigned)
 LILLETTE Kristeen JINNY Gladis, CMA,acting as a Neurosurgeon for Gaines Ada, FNP.,have documented all relevant documentation on the behalf of Gaines Ada, FNP,as directed by  Gaines Ada, FNP while in the presence of Gaines Ada, FNP.  Subjective:  Patient ID: Deborah Jacobson , female    DOB: 11-23-1980 , 43 y.o.   MRN: [id number]  Chief Complaint  Patient presents with   Hypertension    Patient presents today for a bp and pre dm follow up, Patient reports compliance with medication. Patient denies any chest pain, SOB, or headaches. Patient has no concerns today.    Obesity    Patient would like to try something for weight loss.    HPI  Patient presents for blood pressure follow up. Reports compliance and no concerns.  Interested in weight loss medication, patient feels that she is unable to decrease her weight on her own.    She is interested in weight loss, she has increased her water intake.  Baked and broiled foods, increased fruits and veggies such as cherries, plums bananas, salads.   Eliminated breads and sweets.  She gets 6500 steps a day, walks twice a week for 20-30 minutes,  but no consistent exercises routine currently.   Endorses increased perimenopausal symptoms.  She endorses increased emotional lability, hair thinning, and night sweats.  She has an appointment with her GYN next week to address.    Hypertension This is a chronic problem. The current episode started more than 1 year ago. The problem is controlled. Associated symptoms include headaches (with missing medication). Pertinent negatives include no blurred vision, chest pain, palpitations or shortness of breath. Risk factors for coronary artery disease include stress, family history, obesity and sedentary lifestyle. Past treatments include diuretics. Compliance problems include diet and exercise.  There is no history of kidney disease.     Past Medical History:  Diagnosis Date   Acid reflux    Diabetes in pregnancy     Hypertension    Seasonal allergies      Family History  Problem Relation Age of Onset   Diabetes Mother    Liver disease Mother    Hypertension Mother      Current Outpatient Medications:    albuterol  (VENTOLIN  HFA) 108 (90 Base) MCG/ACT inhaler, Inhale 2 puffs into the lungs every 6 (six) hours as needed for wheezing or shortness of breath., Disp: 6.7 g, Rfl: 1   hydrochlorothiazide  (HYDRODIURIL ) 12.5 MG tablet, TAKE 1 TABLET BY MOUTH EVERY DAY, Disp: 30 tablet, Rfl: 8   traZODone  (DESYREL ) 50 MG tablet, TAKE 1 TABLET BY MOUTH EVERY DAY AT BEDTIME AS NEEDED FOR SLEEP, Disp: 30 tablet, Rfl: 2   cholecalciferol (VITAMIN D3) 25 MCG (1000 UNIT) tablet, Take 1,000 Units by mouth daily., Disp: , Rfl:    Estradiol -Norethindrone  Acet 0.5-0.1 MG tablet, Take 1 tablet by mouth daily., Disp: 30 tablet, Rfl: 12   Naltrexone -buPROPion  HCl ER 8-90 MG TB12, Start 1 tablet every morning for 7 days, then 1 tablet twice daily for 7 days, then 2 tablets every morning and one in the evening, Disp: 120 tablet, Rfl: 1   Allergies  Allergen Reactions   Latex Hives and Dermatitis    Powder from gloves     Review of Systems  Constitutional: Negative.  Negative for chills, fatigue and fever.  Eyes:  Negative for blurred vision.  Respiratory: Negative.  Negative for cough and shortness of breath.   Cardiovascular: Negative.  Negative for chest pain, palpitations and leg swelling.  Gastrointestinal:  Negative for abdominal pain, constipation, diarrhea, nausea and vomiting.  Endocrine: Negative for polydipsia, polyphagia and polyuria.  Genitourinary: Negative.   Musculoskeletal:  Negative for myalgias.  Neurological:  Positive for headaches (with missing medication). Negative for dizziness and weakness.  Psychiatric/Behavioral: Negative.       Today's Vitals   06/14/24 1126  BP: 120/80  Pulse: 78  Temp: 98.8 F (37.1 C)  TempSrc: Oral  Weight: 233 lb 9.6 oz (106 kg)  Height: 4' 9 (1.448 m)   PainSc: 0-No pain   Body mass index is 50.55 kg/m.  Wt Readings from Last 3 Encounters:  06/24/24 232 lb 12.8 oz (105.6 kg)  06/14/24 233 lb 9.6 oz (106 kg)  03/01/24 238 lb (108 kg)      Objective:  Physical Exam Vitals and nursing note reviewed.  Constitutional:      General: She is not in acute distress.    Appearance: Normal appearance. She is well-developed. She is obese.  HENT:     Head: Normocephalic and atraumatic.  Neck:     Vascular: No carotid bruit.  Cardiovascular:     Rate and Rhythm: Normal rate and regular rhythm.     Pulses: Normal pulses.     Heart sounds: Normal heart sounds. No murmur heard. Pulmonary:     Effort: Pulmonary effort is normal. No respiratory distress.     Breath sounds: Normal breath sounds. No wheezing.  Chest:     Chest wall: No tenderness.  Musculoskeletal:        General: No swelling or tenderness. Normal range of motion.     Right lower leg: No edema.     Left lower leg: No edema.  Skin:    General: Skin is warm and dry.     Capillary Refill: Capillary refill takes less than 2 seconds.  Neurological:     General: No focal deficit present.     Mental Status: She is alert and oriented to person, place, and time.     Cranial Nerves: No cranial nerve deficit.     Motor: No weakness.  Psychiatric:        Mood and Affect: Mood normal.        Behavior: Behavior normal.        Thought Content: Thought content normal.        Judgment: Judgment normal.      Assessment And Plan:  Essential hypertension Assessment & Plan: Blood pressure is well-controlled.  Continue current medications.  Orders: -     BMP8+eGFR  Prediabetes Assessment & Plan: Hemoglobin A1c was slightly increased, will recheck levels today.    Orders: -     Hemoglobin A1c  Class 3 severe obesity due to excess calories without serious comorbidity with body mass index (BMI) of 50.0 to 59.9 in adult Assessment & Plan: She is encouraged to exercise 150  minutes per week with at least 2 days of strength training.  Encouraged to park further when at the store, take stairs instead of elevators and to walk in place during commercials. Increase water intake to at least one gallon of water daily.   Contrave  prescribed, patient educated on dosage regime and to not take narcotic opioid medications while taking this drug.  Referral to [facility] made.    Orders: -     Amb Ref to Medical Weight Management  Vitamin D  deficiency Assessment & Plan: Will check vitamin D  level and supplement as needed.    Also  encouraged to spend 15 minutes in the sun daily.    Orders: -     VITAMIN D  25 Hydroxy (Vit-D Deficiency, Fractures)  Hair thinning Assessment & Plan: She has been having hair thinning, I will check her metabolic labs. Encouraged to stay well hydrated with water   Orders: -     Vitamin B12 -     TSH      Return for 6 month bp check.  Patient was given opportunity to ask questions. Patient verbalized understanding of the plan and was able to repeat key elements of the plan. All questions were answered to their satisfaction.     LILLETTE Gaines Ada, FNP, have reviewed all documentation for this visit. The documentation on 06/14/24 for the exam, diagnosis, procedures, and orders are all accurate and complete.  IF YOU HAVE BEEN REFERRED TO A SPECIALIST, IT MAY TAKE 1-2 WEEKS TO SCHEDULE/PROCESS THE REFERRAL. IF YOU HAVE NOT HEARD FROM US /SPECIALIST IN TWO WEEKS, PLEASE GIVE US  A CALL AT [phone].

## 2024-06-14 NOTE — Patient Instructions (Signed)
Goal to exercise 150 minutes per week with at least 2 days of strength training Encouraged to park further when at the store, take stairs instead of elevators and to walk in place during commercials. Increase water intake to at least one gallon of water daily.

## 2024-06-14 NOTE — Assessment & Plan Note (Signed)
 She is encouraged to exercise 150 minutes per week with at least 2 days of strength training.  Encouraged to park further when at the store, take stairs instead of elevators and to walk in place during commercials. Increase water intake to at least one gallon of water daily.   Contrave  prescribed, patient educated on dosage regime and to not take narcotic opioid medications while taking this drug.  Referral to Healthy Weight [facility] Wellness made.

## 2024-06-15 LAB — BMP8+EGFR
BUN/Creatinine Ratio: 16 (ref 9–23)
BUN: 13 mg/dL (ref 6–24)
CO2: 24 mmol/L (ref 20–29)
Calcium: 10 mg/dL (ref 8.7–10.2)
Chloride: 98 mmol/L (ref 96–106)
Creatinine, Ser: 0.82 mg/dL (ref 0.57–1.00)
Glucose: 92 mg/dL (ref 70–99)
Potassium: 3.8 mmol/L (ref 3.5–5.2)
Sodium: 138 mmol/L (ref 134–144)
eGFR: 90 mL/min/1.73 (ref >59)

## 2024-06-15 LAB — HEMOGLOBIN A1C
Est. average glucose Bld gHb Est-mCnc: 131 mg/dL
Hgb A1c MFr Bld: 6.2 % — ABNORMAL HIGH (ref 4.8–5.6)

## 2024-06-15 LAB — TSH: TSH: 0.79 u[IU]/mL (ref 0.450–4.500)

## 2024-06-15 LAB — VITAMIN B12: Vitamin B-12: 665 pg/mL (ref 232–1245)

## 2024-06-15 LAB — VITAMIN D 25 HYDROXY (VIT D DEFICIENCY, FRACTURES): Vit D, 25-Hydroxy: 35.8 ng/mL (ref 30.0–100.0)

## 2024-06-24 ENCOUNTER — Ambulatory Visit: Admitting: Family Medicine

## 2024-06-24 ENCOUNTER — Encounter: Payer: Self-pay | Admitting: Family Medicine

## 2024-06-24 ENCOUNTER — Other Ambulatory Visit: Payer: Self-pay

## 2024-06-24 ENCOUNTER — Other Ambulatory Visit (HOSPITAL_COMMUNITY)
Admission: RE | Admit: 2024-06-24 | Discharge: 2024-06-24 | Disposition: A | Discharge status: Home / Self Care | Source: Ambulatory Visit | Attending: Family Medicine | Admitting: Family Medicine

## 2024-06-24 VITALS — BP 104/78 | HR 84 | Wt 232.8 lb

## 2024-06-24 DIAGNOSIS — Z01419 Encounter for gynecological examination (general) (routine) without abnormal findings: Secondary | ICD-10-CM | POA: Diagnosis not present

## 2024-06-24 DIAGNOSIS — Z124 Encounter for screening for malignant neoplasm of cervix: Secondary | ICD-10-CM | POA: Diagnosis present

## 2024-06-24 DIAGNOSIS — N951 Menopausal and female climacteric states: Secondary | ICD-10-CM | POA: Diagnosis not present

## 2024-06-24 DIAGNOSIS — Z1231 Encounter for screening mammogram for malignant neoplasm of breast: Secondary | ICD-10-CM

## 2024-06-24 MED ORDER — ESTRADIOL-NORETHINDRONE ACET 0.5-0.1 MG PO TABS: 1.0000 | ORAL_TABLET | Freq: Every day | ORAL | 12 refills | Status: DC

## 2024-06-24 NOTE — Patient Instructions (Signed)
 Things to do to keep yourself healthy! - Exercise at least 30-45 minutes a day, 3-4 days a week.  - Eat a low-fat diet with lots of fruits and vegetables, up to 7-9 servings per day.  - Seatbelts can save your life. Wear them always.  - Smoke detectors on every level of your home, check batteries every year.  - Eye Doctor: have an eye exam every 1-2 years, even if you do not wear glasses or contacts. - Safe sex: if you may be exposed to STDs, use a condom.  - Alcohol: If you drink, do it moderately, less than 2 drinks per day.  - Health Care Power of Attorney: choose someone to speak for you if you are not able.  - Depression and anxiety are common in our stressful world. If you're feeling stressed, down, or like you're losing interest in things you normally enjoy, please come in for a visit.  - Violence: If anyone is threatening or hurting you, please call immediately.  Everyone deserves to be safe and loved in all of their relationships.

## 2024-06-24 NOTE — Progress Notes (Signed)
 ANNUAL EXAM Patient name: Deborah Jacobson MRN [id number]  Date of birth: 1981-05-18 Chief Complaint:   Gynecologic Exam  History of Present Illness:   Deborah Jacobson is a 43 y.o. [phone] African-American female being seen today for a routine annual exam.  Current complaints: perimenopausal symptoms, crying off and on, mood swings, irritability, thinning hair, sweats.  Patient's last menstrual period was 06/10/2024 (exact date).   The pregnancy intention screening data noted above was reviewed. Potential methods of contraception were discussed. The patient elected to proceed with No data recorded.   Last pap 08/12/2022. Results were: NILM w/ HRHPV negative. H/O abnormal pap: no Last mammogram: 02/26/2023. Results were: normal. Family h/o breast cancer: no Last colonoscopy: N/A. Family h/o colorectal cancer: no     06/24/2024    9:11 AM 01/14/2024    2:12 PM 12/29/2022    4:44 PM 12/17/2022    9:30 AM 05/22/2022    3:16 PM  Depression screen PHQ 2/9  Decreased Interest 0 0 0 0 0  Down, Depressed, Hopeless 0 0 0 0 0  PHQ - 2 Score 0 0 0 0 0  Altered sleeping 1 0 1    Tired, decreased energy 2 0 2    Change in appetite 0 0 0    Feeling bad or failure about yourself  0 0 0    Trouble concentrating 0 0 0    Moving slowly or fidgety/restless 0 0 0    Suicidal thoughts 0 0 0    PHQ-9 Score 3 0 3    Difficult doing work/chores  Not difficult at all           06/24/2024    9:12 AM 01/14/2024    2:12 PM 12/29/2022    4:44 PM 08/12/2021   12:13 PM  GAD 7 : Generalized Anxiety Score  Nervous, Anxious, on Edge 0 0 0 0  Control/stop worrying 0 0 0 0  Worry too much - different things 0 0 0 0  Trouble relaxing 1 0 0 0  Restless 0 0 0 0  Easily annoyed or irritable 1 0 0 0  Afraid - awful might happen 0 0 0 0  Total GAD 7 Score 2 0 0 0  Anxiety Difficulty  Not difficult at all       Review of Systems:   Pertinent items are noted in HPI Denies any headaches, blurred vision, fatigue,  shortness of breath, chest pain, abdominal pain, abnormal vaginal discharge/itching/odor/irritation, problems with periods, bowel movements, urination, or intercourse unless otherwise stated above.  Pertinent History Reviewed:  Reviewed past medical,surgical, social and family history.  Reviewed problem list, medications and allergies.  Physical Assessment:   Vitals:   06/24/24 0853  BP: 104/78  Pulse: 84  Weight: 232 lb 12.8 oz (105.6 kg)   Body mass index is 50.38 kg/m.   Physical Examination:  General appearance - well appearing, and in no distress Mental status - alert, oriented to person, place, and time Psych:  She has a normal mood and affect Skin - warm and dry, normal color, no suspicious lesions noted Chest - effort normal, no problems with respiration noted Heart - normal rate and regular rhythm Neck:  midline trachea, no thyromegaly or nodules Breasts - breasts appear normal, no suspicious masses, no skin or nipple changes or  axillary nodes Abdomen - soft, nontender, nondistended, no masses or organomegaly Pelvic - VULVA: normal appearing vulva with no masses, tenderness or lesions   VAGINA:  normal appearing vagina with normal color and discharge, no lesions   CERVIX: normal appearing cervix without discharge or lesions, no CMT Thin prep pap is done with HR HPV cotesting Extremities:  No swelling or varicosities noted  Chaperone present for exam  No results found for this or any previous visit (from the past 24 hours).  Assessment & Plan:  1. Encounter for well woman exam (Primary) - Cervical cancer screening: Discussed guidelines. Pap with HPV collected - Breast Health: Encouraged self breast awareness/SBE. Referral sent for mammogram. - Routine preventative health maintenance measures emphasized. - Please refer to After Visit Summary for other counseling recommendations.  - Colonoscopy: @ 45yo, or sooner if problems - F/U 12 months and prn   2. Screening for  cervical cancer - Will follow up results of pap smear and manage accordingly.  3. Screening mammogram for breast cancer  Mammogram: in 1 year if results normal, or sooner if problems  4. Perimenopause Discussed symptoms of perimenopause and options for treatment. Patient has tried contraceptive patches and developed dermatitis due to adhesive so would like to avoid patches for hormone therapy. She would prefer pills. Patient still has uterus, will include progesterone component for endometrial protection.  - We discussed the treatment options for menopause as well as indications - we discussed both HRT and non-HRT.  - Discussed the benefits of each and relative effectiveness.  - Discussed goals of therapy i.e. reduction of hot flashes (not complete resolution). Reviewed full response takes up to 2-3 months, including for hormone therapy. - Discussed if HRT we do shortest amount of time at lowest dose. We discussed annual attempts at coming of the HRT typically in the fall months when the weather has cooled down. - We discussed the differences in modes of therapy for HRT -- patch vs oral therapy.  - Discussed risks of HRT: E+P = breast cancer, clotting, MI/Stroke. Discussed risk of E alone. We reviewed that limits of data from the [facility] regarding breast cancer impact - only progesterone used in that study was provera which is biologically active in the best. We MAY reduce that risk by doing a different progesterone based therapy I.e. norethindrone. We reviewed the indication and necessity for progesterone and that estrogen alone in those with a uterus have an increased risk of endometrial cancer - Discussed genitourinary symptoms i.e. urinary issues, vaginal dryness and dyspareunia and that for these symptoms, local treatment is best. - She would like: HRT   Orders Placed This Encounter  Procedures   MM 3D DIAGNOSTIC MAMMOGRAM BILATERAL BREAST    Meds:  Meds ordered this encounter  Medications    Estradiol-Norethindrone Acet 0.5-0.1 MG tablet    Sig: Take 1 tablet by mouth daily.    Dispense:  30 tablet    Refill:  12    Follow-up: Return in about 6 months (around 12/25/2024) for follow up for menopause, started hormone therapy.  Joesph DELENA Sear, PA

## 2024-06-27 ENCOUNTER — Other Ambulatory Visit: Payer: Self-pay | Admitting: Nurse Practitioner

## 2024-06-27 ENCOUNTER — Ambulatory Visit: Payer: Self-pay | Admitting: Nurse Practitioner

## 2024-06-27 DIAGNOSIS — E559 Vitamin D deficiency, unspecified: Secondary | ICD-10-CM | POA: Insufficient documentation

## 2024-06-27 DIAGNOSIS — L659 Nonscarring hair loss, unspecified: Secondary | ICD-10-CM | POA: Insufficient documentation

## 2024-06-27 DIAGNOSIS — Z6841 Body Mass Index (BMI) 40.0 and over, adult: Secondary | ICD-10-CM

## 2024-06-27 LAB — CYTOLOGY - PAP: Diagnosis: NEGATIVE

## 2024-06-27 MED ORDER — NALTREXONE-BUPROPION HCL ER 8-90 MG PO TB12: ORAL_TABLET | ORAL | 1 refills | Status: DC

## 2024-06-27 NOTE — Assessment & Plan Note (Signed)
 She has been having hair thinning, I will check her metabolic labs. Encouraged to stay well hydrated with water

## 2024-06-27 NOTE — Assessment & Plan Note (Signed)
 Will check vitamin D  level and supplement as needed.    Also encouraged to spend 15 minutes in the sun daily.

## 2024-06-27 NOTE — Assessment & Plan Note (Signed)
 Blood pressure is well controlled. Continue current medications.

## 2024-06-27 NOTE — Assessment & Plan Note (Signed)
 Hemoglobin A1c was slightly increased, will recheck levels today.

## 2024-06-30 ENCOUNTER — Ambulatory Visit: Payer: Self-pay | Admitting: Family Medicine

## 2024-07-07 ENCOUNTER — Ambulatory Visit (INDEPENDENT_AMBULATORY_CARE_PROVIDER_SITE_OTHER): Admitting: Podiatry

## 2024-07-07 ENCOUNTER — Encounter: Payer: Self-pay | Admitting: Podiatry

## 2024-07-07 VITALS — Ht <= 58 in | Wt 232.8 lb

## 2024-07-07 DIAGNOSIS — M2012 Hallux valgus (acquired), left foot: Secondary | ICD-10-CM

## 2024-07-07 DIAGNOSIS — M21612 Bunion of left foot: Secondary | ICD-10-CM

## 2024-07-10 NOTE — Progress Notes (Signed)
  Subjective:  Patient ID: Deborah Jacobson, female    DOB: 1980/11/22,  MRN: [id number]  Chief Complaint  Patient presents with   Foot Pain    RM Patient is here for surgical consult for plantar fascitis.    43 y.o. female presents with the above complaint. History confirmed with patient. Since last visit has been using wider shoes, pads, OTC NSAIDs and have not helped much.  Objective:  Physical Exam: warm, good capillary refill, no trophic changes or ulcerative lesions, normal DP and PT pulses, normal sensory exam, and bilateral hallux valgus with hypermobility at the first TMT, no pain in joint with ROM   Radiographs: Multiple views x-ray of both feet: moderate hallux valgus with bunions noted on xrays taken 03/01/24 Assessment:  No diagnosis found.   Plan:  Patient was evaluated and treated and all questions answered.  Discussed the etiology and treatment including surgical and non surgical treatment for painful bunions. She  has exhausted all non surgical treatment prior to this visit including shoe gear changes and padding. She desires surgical intervention. We discussed all risks including but not limited to: pain, swelling, infection, scar, numbness which may be temporary or permanent, chronic pain, stiffness, nerve pain or damage, wound healing problems, bone healing problems including delayed or non-union and recurrence. Specifically we discussed the following procedures: Lapidus bunionectomy of the left foot with Akin osteotomy. Informed consent was signed today. Surgery will be scheduled at a mutually agreeable date. Information regarding this will be forwarded to our surgery scheduler. In the interim until surgery I recommended utilizing as wide of shoes as possible, take NSAIDs or tylenol  as tolerated for pain, and a bunion padding shield which can be purchased online.   Surgical plan:  Procedure: -left foot Lapidus, Akin  Location: -[facility]  Anesthesia plan: -sedation  with Regional block  Postoperative pain plan: - Tylenol  1000 mg every 6 hours, ibuprofen  600 mg every 6 hours, gabapentin 300 mg every 8 hours x5 days, oxycodone  5 mg 1-2 tabs every 6 hours only as needed  DVT prophylaxis: -ASA 325mg  BID  WB Restrictions / DME needs: -NWB in CAM boot post op   No follow-ups on file.

## 2024-07-13 ENCOUNTER — Encounter: Payer: Self-pay | Admitting: Podiatry

## 2024-07-13 MED ORDER — DICLOFENAC SODIUM 75 MG PO TBEC: 75.0000 mg | DELAYED_RELEASE_TABLET | Freq: Two times a day (BID) | ORAL | 0 refills | Status: DC

## 2024-07-20 ENCOUNTER — Ambulatory Visit

## 2024-07-21 ENCOUNTER — Encounter: Payer: Self-pay | Admitting: Nurse Practitioner

## 2024-07-21 ENCOUNTER — Telehealth: Payer: Self-pay | Admitting: Podiatry

## 2024-07-21 ENCOUNTER — Ambulatory Visit: Admitting: Nurse Practitioner

## 2024-07-21 VITALS — BP 118/78 | HR 85 | Temp 97.4°F

## 2024-07-21 DIAGNOSIS — Z789 Other specified health status: Secondary | ICD-10-CM

## 2024-07-21 NOTE — Progress Notes (Signed)
 Occupational Health- [facility]  Subjective:  Patient ID: Deborah Jacobson, female    DOB: [birth year]  Age: 43 y.o. MRN: [id number]  CC: wellness exam   HPI Deborah Jacobson presents for wellness exam visit for insurance benefit.  Patient has a ERE:Deborah Georgina, FNP PMH significant for: HTN, insomnia, vit d def Last labs per PCP were completed: August 2025  Health Maintenance:  Colonoscopy:no family history  Mammogram: scheduled for next week.  Pap: 2025, repeat in 5 years.    Smoker: Former, quit in 2010. 3-4 cigarettes a day x 13 years  Immunizations:   Tdap- due 2030 Flu-plans to get it this year.   Lifestyle: Diet- no particular diet Exercise- none at this time.      Past Medical History:  Diagnosis Date   Acid reflux    Diabetes in pregnancy    Hypertension    Seasonal allergies     Past Surgical History:  Procedure Laterality Date   CESAREAN SECTION     SHOULDER ARTHROSCOPY WITH BICEPS TENDON REPAIR Left 08/29/2020   Procedure: LEFT SHOULDER ARTHROSCOPY WITH BICEPS TENODESIS;  Surgeon: Jerri Kay HERO, MD;  Location: [facility];  Service: Orthopedics;  Laterality: Left;   TUBAL LIGATION      Outpatient Medications Prior to Visit  Medication Sig Dispense Refill   albuterol  (VENTOLIN  HFA) 108 (90 Base) MCG/ACT inhaler Inhale 2 puffs into the lungs every 6 (six) hours as needed for wheezing or shortness of breath. 6.7 g 1   cholecalciferol (VITAMIN D3) 25 MCG (1000 UNIT) tablet Take 1,000 Units by mouth daily.     diclofenac  (VOLTAREN ) 75 MG EC tablet Take 1 tablet (75 mg total) by mouth 2 (two) times daily. 30 tablet 0   Estradiol -Norethindrone  Acet 0.5-0.1 MG tablet Take 1 tablet by mouth daily. 30 tablet 12   hydrochlorothiazide  (HYDRODIURIL ) 12.5 MG tablet TAKE 1 TABLET BY MOUTH EVERY DAY 30 tablet 8   Naltrexone -buPROPion  HCl ER 8-90 MG TB12 Start 1 tablet every morning for 7 days, then 1 tablet twice daily for 7 days, then 2 tablets every  morning and one in the evening 120 tablet 1   traZODone  (DESYREL ) 50 MG tablet TAKE 1 TABLET BY MOUTH EVERY DAY AT BEDTIME AS NEEDED FOR SLEEP 30 tablet 2   No facility-administered medications prior to visit.    ROS Review of Systems  HENT:  Negative for hearing loss.   Eyes:  Negative for visual disturbance.  Respiratory:  Negative for shortness of breath.   Cardiovascular:  Negative for chest pain.  Gastrointestinal:  Negative for constipation, diarrhea, nausea and vomiting.  Musculoskeletal:  Positive for arthralgias (shoulder).  Neurological:  Negative for dizziness and headaches.  Psychiatric/Behavioral:  Positive for sleep disturbance (works night shift).     Objective:  BP 118/78 (BP Location: Right Arm, Patient Position: Sitting, Cuff Size: Normal)   Pulse 85   Temp (!) 97.4 F (36.3 C) (Temporal)   LMP 07/11/2024 (Exact Date)   SpO2 97%   Physical Exam Constitutional:      General: She is not in acute distress. HENT:     Head: Normocephalic.     Right Ear: Tympanic membrane, ear canal and external ear normal.     Left Ear: Tympanic membrane, ear canal and external ear normal.     Mouth/Throat:     Mouth: Mucous membranes are moist.     Pharynx: No oropharyngeal exudate or posterior oropharyngeal erythema.  Eyes:  Pupils: Pupils are equal, round, and reactive to light.  Cardiovascular:     Rate and Rhythm: Normal rate and regular rhythm.     Heart sounds: Normal heart sounds.  Pulmonary:     Effort: Pulmonary effort is normal.     Breath sounds: Normal breath sounds.  Musculoskeletal:        General: Normal range of motion.  Skin:    General: Skin is warm.  Neurological:     General: No focal deficit present.     Mental Status: She is alert and oriented to person, place, and time.  Psychiatric:        Mood and Affect: Mood normal.        Behavior: Behavior normal.      Assessment & Plan:    Millena was seen today for wellness exam.  Diagnoses  and all orders for this visit:  Participant in health and wellness plan Adult wellness physical was conducted today. Importance of diet and exercise were discussed in detail.  We reviewed immunizations and gave recommendations regarding current immunization needed for age.  Preventative health exams needed: mammogram which is already scheduled for next week.   Patient was advised yearly wellness exam and follow-up with PCP as scheduled.     No orders of the defined types were placed in this encounter.   No orders of the defined types were placed in this encounter.   Follow-up: as needed.

## 2024-07-21 NOTE — Telephone Encounter (Signed)
 Called pt and she is currently scheduled for surgery on 12/19 at [facility] and would prefer after christmas she is to call in November to see if the [facility] schedule is opened.  She is not on blood thinners or Glp1 medications. Pharmacy is correct in chart.   She is aware she will have to see pcp within a month of surgery for an h&p .

## 2024-07-26 ENCOUNTER — Ambulatory Visit
Admission: RE | Admit: 2024-07-26 | Discharge: 2024-07-26 | Disposition: A | Discharge status: Home / Self Care | Source: Ambulatory Visit | Attending: Family Medicine | Admitting: Family Medicine

## 2024-07-26 DIAGNOSIS — Z1231 Encounter for screening mammogram for malignant neoplasm of breast: Secondary | ICD-10-CM

## 2024-08-22 ENCOUNTER — Encounter: Payer: Self-pay | Admitting: Podiatry

## 2024-08-24 NOTE — Telephone Encounter (Signed)
 Submitted via [employer].

## 2024-08-24 NOTE — Telephone Encounter (Signed)
 note from Dr. Silva to contact pt. I called and lft mess on vmail for her to get her forms sent to me and 25.00 fee must be paid prior to completion and we can do a note for her hubby to be out with her. I will send MyChart mess as well

## 2024-08-26 ENCOUNTER — Encounter: Payer: Self-pay | Admitting: Nurse Practitioner

## 2024-08-31 ENCOUNTER — Ambulatory Visit: Admitting: Nurse Practitioner

## 2024-09-06 ENCOUNTER — Other Ambulatory Visit: Payer: Self-pay | Admitting: Family Medicine

## 2024-09-06 DIAGNOSIS — N951 Menopausal and female climacteric states: Secondary | ICD-10-CM

## 2024-09-07 ENCOUNTER — Ambulatory Visit: Admitting: Nurse Practitioner

## 2024-09-07 NOTE — Progress Notes (Deleted)
 LILLETTE Kristeen JINNY Gladis, CMA,acting as a neurosurgeon for Deborah Ada, Deborah Jacobson.,have documented all relevant documentation on the behalf of Deborah Ada, Deborah Jacobson,as directed by  Deborah Ada, Deborah Jacobson while in the presence of Deborah Ada, Deborah Jacobson.  Subjective:  Patient ID: Deborah Jacobson , female    DOB: 02-09-1981 , 43 y.o.   MRN: [id number]  No chief complaint on file.   HPI  HPI   Past Medical History:  Diagnosis Date   Acid reflux    Diabetes in pregnancy    Hypertension    Seasonal allergies      Family History  Problem Relation Age of Onset   Diabetes Mother    Liver disease Mother    Hypertension Mother    Breast cancer Neg Hx      Current Outpatient Medications:    ABIGALE  LO 0.5-0.1 MG tablet, TAKE 1 TABLET BY MOUTH EVERY DAY, Disp: 28 tablet, Rfl: 12   albuterol  (VENTOLIN  HFA) 108 (90 Base) MCG/ACT inhaler, Inhale 2 puffs into the lungs every 6 (six) hours as needed for wheezing or shortness of breath., Disp: 6.7 g, Rfl: 1   cholecalciferol (VITAMIN D3) 25 MCG (1000 UNIT) tablet, Take 1,000 Units by mouth daily., Disp: , Rfl:    diclofenac  (VOLTAREN ) 75 MG EC tablet, Take 1 tablet (75 mg total) by mouth 2 (two) times daily., Disp: 30 tablet, Rfl: 0   hydrochlorothiazide  (HYDRODIURIL ) 12.5 MG tablet, TAKE 1 TABLET BY MOUTH EVERY DAY, Disp: 30 tablet, Rfl: 8   Naltrexone -buPROPion  HCl ER 8-90 MG TB12, Start 1 tablet every morning for 7 days, then 1 tablet twice daily for 7 days, then 2 tablets every morning and one in the evening, Disp: 120 tablet, Rfl: 1   traZODone  (DESYREL ) 50 MG tablet, TAKE 1 TABLET BY MOUTH EVERY DAY AT BEDTIME AS NEEDED FOR SLEEP, Disp: 30 tablet, Rfl: 2   Allergies  Allergen Reactions   Latex Hives and Dermatitis    Powder from gloves     Review of Systems   There were no vitals filed for this visit. There is no height or weight on file to calculate BMI.  Wt Readings from Last 3 Encounters:  07/07/24 232 lb 12.8 oz (105.6 kg)  06/24/24 232 lb 12.8 oz (105.6 kg)   06/14/24 233 lb 9.6 oz (106 kg)    The 10-year ASCVD risk score (Arnett DK, et al., 2019) is: 1.1%   Values used to calculate the score:     Age: 58 years     Clincally relevant sex: Female     Is Non-Hispanic African American: Yes     Diabetic: No     Tobacco smoker: No     Systolic Blood Pressure: 118 mmHg     Is BP treated: Yes     HDL Cholesterol: 59 mg/dL     Total Cholesterol: 171 mg/dL  Objective:  Physical Exam      Assessment And Plan:  There are no diagnoses linked to this encounter.  No follow-ups on file.  Patient was given opportunity to ask questions. Patient verbalized understanding of the plan and was able to repeat key elements of the plan. All questions were answered to their satisfaction.    LILLETTE Deborah Ada, Deborah Jacobson, have reviewed all documentation for this visit. The documentation on 09/07/24 for the exam, diagnosis, procedures, and orders are all accurate and complete.   IF YOU HAVE BEEN REFERRED TO A SPECIALIST, IT MAY TAKE 1-2 WEEKS TO SCHEDULE/PROCESS THE REFERRAL. IF YOU  HAVE NOT HEARD FROM US /SPECIALIST IN TWO WEEKS, PLEASE GIVE US  A CALL AT [phone] X [phone].

## 2024-09-12 ENCOUNTER — Encounter: Payer: Self-pay | Admitting: Podiatry

## 2024-10-10 DIAGNOSIS — Z0279 Encounter for issue of other medical certificate: Secondary | ICD-10-CM

## 2024-10-12 ENCOUNTER — Telehealth: Payer: Self-pay | Admitting: Podiatry

## 2024-10-12 NOTE — Telephone Encounter (Signed)
 Faxed [facility] [phone] forms for DOS 10/28/24 RTW approx 01/26/25

## 2024-10-19 ENCOUNTER — Encounter: Payer: Self-pay | Admitting: Podiatry

## 2024-10-21 ENCOUNTER — Inpatient Hospital Stay: Admission: RE | Admit: 2024-10-21 | Discharge: 2024-10-21 | Attending: Podiatry

## 2024-10-21 ENCOUNTER — Other Ambulatory Visit: Payer: Self-pay

## 2024-10-21 VITALS — Wt 230.0 lb

## 2024-10-21 DIAGNOSIS — I10 Essential (primary) hypertension: Secondary | ICD-10-CM

## 2024-10-21 DIAGNOSIS — Z01818 Encounter for other preprocedural examination: Secondary | ICD-10-CM

## 2024-10-21 DIAGNOSIS — M21612 Bunion of left foot: Secondary | ICD-10-CM

## 2024-10-21 DIAGNOSIS — Z01810 Encounter for preprocedural cardiovascular examination: Secondary | ICD-10-CM

## 2024-10-21 DIAGNOSIS — R002 Palpitations: Secondary | ICD-10-CM

## 2024-10-21 DIAGNOSIS — D508 Other iron deficiency anemias: Secondary | ICD-10-CM

## 2024-10-21 DIAGNOSIS — Z01812 Encounter for preprocedural laboratory examination: Secondary | ICD-10-CM

## 2024-10-21 NOTE — Patient Instructions (Addendum)
 Your procedure is scheduled on:10-28-24 Friday Report to the Registration Desk on the 1st floor of the [facility].Then proceed to the 2nd floor Surgery Desk To find out your arrival time, please call [phone] between 1PM - 3PM on:10-27-24 Thursday If your arrival time is 6:00 am, do not arrive before that time as the Medical Mall entrance doors do not open until 6:00 am.  REMEMBER: Instructions that are not followed completely may result in serious medical risk, up to and including death; or upon the discretion of your surgeon and anesthesiologist your surgery may need to be rescheduled.  Do not eat food OR drink liquids after midnight the night before surgery (Except for the Ensure that Dr. Silva has ordered for you to drink) No gum chewing or hard candies.  In addition, your doctor has ordered for you to drink the provided:  Ensure Pre-Surgery Clear Carbohydrate Drink  Drinking this carbohydrate drink up to two hours before surgery helps to reduce insulin resistance and improve patient outcomes. Please complete drinking 2 hours before scheduled arrival time.  One week prior to surgery:Stop NOW (10-21-24) Stop Anti-inflammatories (NSAIDS) such as Advil , Aleve , Ibuprofen , Motrin , Naproxen , Naprosyn  and Aspirin based products such as Excedrin, Goody's Powder, BC Powder. Stop ANY OVER THE COUNTER supplements until after surgery (Vitamin C, Vitamin D3, Elderberry, Zinc)  You may however, continue to take Tylenol  if needed for pain up until the day of surgery.  Continue taking all of your other prescription medications up until the day of surgery.  Do NOT take any medication the day of surgery  No Alcohol for 24 hours before or after surgery.  No Smoking including e-cigarettes for 24 hours before surgery.  No chewable tobacco products for at least 6 hours before surgery.  No nicotine patches on the day of surgery.  Do not use any recreational drugs for at least a week  (preferably 2 weeks) before your surgery.  Please be advised that the combination of cocaine and anesthesia may have negative outcomes, up to and including death. If you test positive for cocaine, your surgery will be cancelled.  On the morning of surgery brush your teeth with toothpaste and water, you may rinse your mouth with mouthwash if you wish. Do not swallow any toothpaste or mouthwash.  Use CHG Soap as directed on instruction sheet.  Do not wear jewelry, make-up, hairpins, clips or nail polish.  For welded (permanent) jewelry: bracelets, anklets, waist bands, etc.  Please have this removed prior to surgery.  If it is not removed, there is a chance that hospital personnel will need to cut it off on the day of surgery.  Do not wear lotions, powders, or perfumes.   Do not shave body hair from the neck down 48 hours before surgery.  Contact lenses, hearing aids and dentures may not be worn into surgery.  Do not bring valuables to the hospital. [facility] is not responsible for any missing/lost belongings or valuables.   Notify your doctor if there is any change in your medical condition (cold, fever, infection).  Wear comfortable clothing (specific to your surgery type) to the hospital.  After surgery, you can help prevent lung complications by doing breathing exercises.  Take deep breaths and cough every 1-2 hours. Your doctor may order a device called an Incentive Spirometer to help you take deep breaths. When coughing or sneezing, hold a pillow firmly against your incision with both hands. This is called splinting. Doing this helps protect your incision.  It also decreases belly discomfort.  If you are being admitted to the hospital overnight, leave your suitcase in the car. After surgery it may be brought to your room.  In case of increased patient census, it may be necessary for you, the patient, to continue your postoperative care in the Same Day Surgery department.  If  you are being discharged the day of surgery, you will not be allowed to drive home. You will need a responsible individual to drive you home and stay with you for 24 hours after surgery.   If you are taking public transportation, you will need to have a responsible individual with you.  Please call the Pre-admissions Testing Dept. at [phone] if you have any questions about these instructions.  Surgery Visitation Policy:  Patients having surgery or a procedure may have two visitors.  Children under the age of 51 must have an adult with them who is not the patient.                                                                                                             Preparing for Surgery with CHLORHEXIDINE GLUCONATE (CHG) Soap  Chlorhexidine Gluconate (CHG) Soap  o An antiseptic cleaner that kills germs and bonds with the skin to continue killing germs even after washing  o Used for showering the night before surgery and morning of surgery  Before surgery, you can play an important role by reducing the number of germs on your skin.  CHG (Chlorhexidine gluconate) soap is an antiseptic cleanser which kills germs and bonds with the skin to continue killing germs even after washing.  Please do not use if you have an allergy to CHG or antibacterial soaps. If your skin becomes reddened/irritated stop using the CHG.  1. Shower the NIGHT BEFORE SURGERY with CHG soap.  2. If you choose to wash your hair, wash your hair first as usual with your normal shampoo.  3. After shampooing, rinse your hair and body thoroughly to remove the shampoo.  4. Use CHG as you would any other liquid soap. You can apply CHG directly to the skin and wash gently with a clean washcloth.  5. Apply the CHG soap to your body only from the neck down. Do not use on open wounds or open sores. Avoid contact with your eyes, ears, mouth, and genitals (private parts). Wash face and genitals (private parts) with  your normal soap.  6. Wash thoroughly, paying special attention to the area where your surgery will be performed.  7. Thoroughly rinse your body with warm water.  8. Do not shower/wash with your normal soap after using and rinsing off the CHG soap.  9. Do not use lotions, oils, etc., after showering with CHG.  10. Pat yourself dry with a clean towel.  11. Wear clean pajamas to bed the night before surgery.  12. Place clean sheets on your bed the night of your shower and do not sleep with pets.  13. Do not apply any deodorants/lotions/powders.  14. Please  wear clean clothes to the hospital.  15. Remember to brush your teeth with your regular toothpaste.   [employer] to address health-related social needs:  [email or web address]

## 2024-10-24 ENCOUNTER — Inpatient Hospital Stay: Admission: RE | Admit: 2024-10-24 | Discharge: 2024-10-24 | Attending: Podiatry

## 2024-10-24 DIAGNOSIS — R002 Palpitations: Secondary | ICD-10-CM

## 2024-10-24 DIAGNOSIS — I10 Essential (primary) hypertension: Secondary | ICD-10-CM

## 2024-10-24 DIAGNOSIS — Z01812 Encounter for preprocedural laboratory examination: Secondary | ICD-10-CM

## 2024-10-24 DIAGNOSIS — Z6841 Body Mass Index (BMI) 40.0 and over, adult: Secondary | ICD-10-CM | POA: Diagnosis not present

## 2024-10-24 DIAGNOSIS — D508 Other iron deficiency anemias: Secondary | ICD-10-CM | POA: Diagnosis not present

## 2024-10-24 DIAGNOSIS — Z01810 Encounter for preprocedural cardiovascular examination: Secondary | ICD-10-CM | POA: Diagnosis present

## 2024-10-24 DIAGNOSIS — Z01818 Encounter for other preprocedural examination: Secondary | ICD-10-CM | POA: Diagnosis not present

## 2024-10-24 LAB — BASIC METABOLIC PANEL WITH GFR
Anion gap: 9 (ref 5–15)
BUN: 13 mg/dL (ref 6–20)
CO2: 25 mmol/L (ref 22–32)
Calcium: 9 mg/dL (ref 8.9–10.3)
Chloride: 106 mmol/L (ref 98–111)
Creatinine, Ser: 0.89 mg/dL (ref 0.44–1.00)
GFR, Estimated: >60 mL/min (ref >60)
Glucose, Bld: 97 mg/dL (ref 70–99)
Potassium: 3.6 mmol/L (ref 3.5–5.1)
Sodium: 140 mmol/L (ref 135–145)

## 2024-10-24 LAB — CBC
HCT: 36.7 % (ref 36.0–46.0)
Hemoglobin: 11.7 g/dL — ABNORMAL LOW (ref 12.0–15.0)
MCH: 27.5 pg (ref 26.0–34.0)
MCHC: 31.9 g/dL (ref 30.0–36.0)
MCV: 86.4 fL (ref 80.0–100.0)
Platelets: 286 K/uL (ref 150–400)
RBC: 4.25 MIL/uL (ref 3.87–5.11)
RDW: 15.5 % (ref 11.5–15.5)
WBC: 6.7 K/uL (ref 4.0–10.5)
nRBC: 0 % (ref 0.0–0.2)

## 2024-10-27 ENCOUNTER — Telehealth: Payer: Self-pay | Admitting: Podiatry

## 2024-10-27 NOTE — Telephone Encounter (Signed)
 Called patient to reschedule surgery due to the fact the authorization was not approved in time for surgery. Patient moved to next [facility] day on 12/02/2024.

## 2024-10-28 ENCOUNTER — Telehealth: Payer: Self-pay | Admitting: Podiatry

## 2024-10-28 NOTE — Telephone Encounter (Signed)
 cld pt bk from mess lft Her DOS changed to 12/02/24. I adv her would correct form and resend with new date with a note of change and RTW change- approx 02/27/25 Faxed [facility] [phone]

## 2024-10-31 ENCOUNTER — Encounter

## 2024-11-01 ENCOUNTER — Encounter: Admitting: Podiatry

## 2024-11-14 ENCOUNTER — Telehealth: Payer: Self-pay | Admitting: Podiatry

## 2024-11-14 ENCOUNTER — Encounter: Payer: Self-pay | Admitting: Podiatry

## 2024-11-14 NOTE — Telephone Encounter (Signed)
 Faxed [facility] [phone] and emailed pt copy for her records New DOS 11/25/24  RTW approx 02/20/25

## 2024-11-14 NOTE — Telephone Encounter (Signed)
 DOS has changed again to 11/25/24. will update form and do a new work note of change and email the pt for her records

## 2024-11-17 ENCOUNTER — Encounter

## 2024-11-19 ENCOUNTER — Encounter: Payer: Self-pay | Admitting: Podiatry

## 2024-11-21 ENCOUNTER — Telehealth: Payer: Self-pay | Admitting: Podiatry

## 2024-11-21 NOTE — Telephone Encounter (Signed)
 Patient contacted me this morning in regards to upcoming surgery and her insurance issues. Patient was able to speak with social services on Friday and they have started process to terminate her insurance but it will not show as terminated until 12/11/2024. I explained to patient that while it is still in effect we will be required to still obtain authorization from her secondary. Patient expressed understanding and is going to contact her social worker again to day to see if the termination can be expedited. Would we possibly be able to move patient to [facility] in February if she only has the [facility] plan or stick with [facility]? Patient will be by later today to speak with me in person.

## 2024-11-25 ENCOUNTER — Ambulatory Visit: Admission: RE | Admit: 2024-11-25 | Source: Home / Self Care | Admitting: Podiatry

## 2024-11-25 ENCOUNTER — Encounter: Admission: RE | Payer: Self-pay | Source: Home / Self Care

## 2024-11-25 SURGERY — BUNIONECTOMY, AKIN
Anesthesia: Monitor Anesthesia Care | Site: Toe | Laterality: Left

## 2024-12-02 NOTE — Telephone Encounter (Signed)
 cld pt bk from mess she lft and gave email and fax for forms to be sent to me. DOS is now 12/26/24 so new forms need to be done. I adv her no charge for revised

## 2024-12-07 NOTE — Telephone Encounter (Signed)
 Faxed [facility] [phone]  new form with new DOS 11/25/24 and RTW 02/20/25

## 2024-12-08 ENCOUNTER — Encounter: Admitting: Podiatry

## 2024-12-08 ENCOUNTER — Encounter: Payer: Self-pay | Admitting: Podiatry

## 2024-12-08 ENCOUNTER — Telehealth: Payer: Self-pay | Admitting: Podiatry

## 2024-12-08 NOTE — Telephone Encounter (Signed)
 cld to adv Carla to send new form to complete for pt. I had old DOS 11/25/24- needs to complete with new DOS 12/26/24.I left my fax# to send new form

## 2024-12-08 NOTE — Telephone Encounter (Signed)
 Refaxed correct forms/note with the pt's new DOS 12/26/24/ Faxed to [facility] [phone] Approx RTW 03/20/25

## 2024-12-12 NOTE — Progress Notes (Unsigned)
 LILLETTE Kristeen JINNY Gladis, CMA,acting as a neurosurgeon for Gaines Ada, FNP.,have documented all relevant documentation on the behalf of Gaines Ada, FNP,as directed by  Gaines Ada, FNP while in the presence of Gaines Ada, FNP.  Subjective:  Patient ID: Deborah Jacobson , female    DOB: 09/15/1981 , 44 y.o.   MRN: [id number]  No chief complaint on file.   HPI  HPI   Past Medical History:  Diagnosis Date   Acid reflux    Bunion, left foot    Chronic low back pain    COVID-19 09/23/2024   Diabetes in pregnancy    Dyspnea    Former cigarette smoker    Hypertension    IDA (iron deficiency anemia)    Morbid obesity (HCC)    Palpitations    Plantar fasciitis of left foot    Porokeratosis    Pre-diabetes    Seasonal allergies    Vitamin D  deficiency      Family History  Problem Relation Age of Onset   Diabetes Mother    Liver disease Mother    Hypertension Mother    Breast cancer Neg Hx     Current Medications[1]   Allergies[2]   Review of Systems   There were no vitals filed for this visit. There is no height or weight on file to calculate BMI.  Wt Readings from Last 3 Encounters:  10/21/24 230 lb (104.3 kg)  07/07/24 232 lb 12.8 oz (105.6 kg)  06/24/24 232 lb 12.8 oz (105.6 kg)    The ASCVD Risk score (Arnett DK, et al., 2019) failed to calculate for the following reasons:   Cannot find a previous HDL lab   Cannot find a previous total cholesterol lab   * - Cholesterol units were assumed  Objective:  Physical Exam      Assessment And Plan:   Assessment & Plan Essential hypertension  Prediabetes   No orders of the defined types were placed in this encounter.    No follow-ups on file.  Patient was given opportunity to ask questions. Patient verbalized understanding of the plan and was able to repeat key elements of the plan. All questions were answered to their satisfaction.    LILLETTE Gaines Ada, FNP, have reviewed all documentation for this visit. The  documentation on 12/12/24 for the exam, diagnosis, procedures, and orders are all accurate and complete.   IF YOU HAVE BEEN REFERRED TO A SPECIALIST, IT MAY TAKE 1-2 WEEKS TO SCHEDULE/PROCESS THE REFERRAL. IF YOU HAVE NOT HEARD FROM US /SPECIALIST IN TWO WEEKS, PLEASE GIVE US  A CALL AT [phone] X [phone].     [1]  Current Outpatient Medications:    acetaminophen  (TYLENOL ) 500 MG tablet, Take 1,000 mg by mouth every 6 (six) hours as needed for moderate pain (pain score 4-6) or mild pain (pain score 1-3)., Disp: , Rfl:    albuterol  (VENTOLIN  HFA) 108 (90 Base) MCG/ACT inhaler, Inhale 2 puffs into the lungs every 6 (six) hours as needed for wheezing or shortness of breath. (Patient not taking: Reported on 10/21/2024), Disp: 6.7 g, Rfl: 1   Ascorbic Acid (VITAMIN C PO), Take 1 tablet by mouth daily., Disp: , Rfl:    cholecalciferol (VITAMIN D3) 25 MCG (1000 UNIT) tablet, Take 1,000 Units by mouth daily., Disp: , Rfl:    ELDERBERRY PO, Take 1 tablet by mouth daily., Disp: , Rfl:    fluticasone  (FLONASE ) 50 MCG/ACT nasal spray, Place 1 spray into both nostrils daily as needed for allergies  or rhinitis., Disp: , Rfl:    hydrochlorothiazide  (HYDRODIURIL ) 12.5 MG tablet, TAKE 1 TABLET BY MOUTH EVERY DAY, Disp: 30 tablet, Rfl: 8   Menthol-Methyl Salicylate (MUSCLE RUB EX), Apply 1 Application topically daily as needed (back pain)., Disp: , Rfl:    Multiple Vitamins-Minerals (ZINC PO), Take 1 tablet by mouth daily., Disp: , Rfl:    traZODone  (DESYREL ) 50 MG tablet, TAKE 1 TABLET BY MOUTH EVERY DAY AT BEDTIME AS NEEDED FOR SLEEP, Disp: 30 tablet, Rfl: 2 [2]  Allergies Allergen Reactions   Latex Hives and Dermatitis    Powder from gloves

## 2024-12-15 ENCOUNTER — Encounter: Payer: Self-pay | Admitting: Nurse Practitioner

## 2024-12-15 ENCOUNTER — Ambulatory Visit: Payer: Self-pay | Admitting: Nurse Practitioner

## 2024-12-15 VITALS — BP 130/80 | HR 75 | Temp 98.6°F | Ht <= 58 in | Wt 234.4 lb

## 2024-12-15 DIAGNOSIS — R7303 Prediabetes: Secondary | ICD-10-CM

## 2024-12-15 DIAGNOSIS — I10 Essential (primary) hypertension: Secondary | ICD-10-CM

## 2024-12-15 DIAGNOSIS — N951 Menopausal and female climacteric states: Secondary | ICD-10-CM

## 2024-12-15 DIAGNOSIS — Z6841 Body Mass Index (BMI) 40.0 and over, adult: Secondary | ICD-10-CM

## 2024-12-15 DIAGNOSIS — F439 Reaction to severe stress, unspecified: Secondary | ICD-10-CM

## 2024-12-15 LAB — BMP8+EGFR
BUN/Creatinine Ratio: 13 (ref 9–23)
BUN: 10 mg/dL (ref 6–24)
CO2: 22 mmol/L (ref 20–29)
Calcium: 9.4 mg/dL (ref 8.7–10.2)
Chloride: 100 mmol/L (ref 96–106)
Creatinine, Ser: 0.77 mg/dL (ref 0.57–1.00)
Glucose: 95 mg/dL (ref 70–99)
Potassium: 3.8 mmol/L (ref 3.5–5.2)
Sodium: 138 mmol/L (ref 134–144)
eGFR: 97 mL/min/{1.73_m2}

## 2024-12-15 LAB — HEMOGLOBIN A1C
Est. average glucose Bld gHb Est-mCnc: 123 mg/dL
Hgb A1c MFr Bld: 5.9 % — ABNORMAL HIGH (ref 4.8–5.6)

## 2024-12-16 ENCOUNTER — Telehealth: Payer: Self-pay | Admitting: Podiatry

## 2024-12-16 LAB — MICROALBUMIN / CREATININE URINE RATIO
Creatinine, Urine: 94.3 mg/dL
Microalb/Creat Ratio: 15 mg/g{creat} (ref 0–29)
Microalbumin, Urine: 14.2 ug/mL

## 2024-12-16 NOTE — Telephone Encounter (Signed)
 DOS- 12/26/2024  [employer] OSTEOTOMY LT- 71689 [employer] BUNIONECTOMY LT- 71702  [employer] EFFECTIVE DATE- 10/09/2024  DEDUCTIBLE- $1000 OOP- $5000 COINSURANCE- 20%  PER TINA WITH [employer], PRIOR AUTH IS NOT REQUIRED FOR CPT CODES 71689 AND 71702. REF# TINA S 12/16/2024

## 2025-06-14 ENCOUNTER — Ambulatory Visit: Payer: Self-pay | Admitting: Nurse Practitioner
